# Patient Record
Sex: Female | Born: 1935 | Race: White | Hispanic: No | Smoking: Never smoker
Health system: Southern US, Community
[De-identification: ages and names within clinical notes are randomized; demographics above are authoritative.]

## PROBLEM LIST (undated history)

## (undated) DIAGNOSIS — K219 Gastro-esophageal reflux disease without esophagitis: Secondary | ICD-10-CM

## (undated) DIAGNOSIS — IMO0002 Reserved for concepts with insufficient information to code with codable children: Secondary | ICD-10-CM

## (undated) DIAGNOSIS — E039 Hypothyroidism, unspecified: Secondary | ICD-10-CM

## (undated) DIAGNOSIS — E785 Hyperlipidemia, unspecified: Secondary | ICD-10-CM

## (undated) DIAGNOSIS — I951 Orthostatic hypotension: Secondary | ICD-10-CM

## (undated) DIAGNOSIS — K279 Peptic ulcer, site unspecified, unspecified as acute or chronic, without hemorrhage or perforation: Secondary | ICD-10-CM

## (undated) DIAGNOSIS — M1990 Unspecified osteoarthritis, unspecified site: Secondary | ICD-10-CM

## (undated) DIAGNOSIS — F0390 Unspecified dementia without behavioral disturbance: Secondary | ICD-10-CM

## (undated) DIAGNOSIS — R55 Syncope and collapse: Secondary | ICD-10-CM

## (undated) DIAGNOSIS — I2510 Atherosclerotic heart disease of native coronary artery without angina pectoris: Secondary | ICD-10-CM

## (undated) DIAGNOSIS — C50919 Malignant neoplasm of unspecified site of unspecified female breast: Secondary | ICD-10-CM

## (undated) HISTORY — DX: Reserved for concepts with insufficient information to code with codable children: IMO0002

## (undated) HISTORY — DX: Gastro-esophageal reflux disease without esophagitis: K21.9

## (undated) HISTORY — PX: NASAL SINUS SURGERY: SHX719

## (undated) HISTORY — DX: Hypothyroidism, unspecified: E03.9

## (undated) HISTORY — DX: Syncope and collapse: R55

## (undated) HISTORY — DX: Unspecified osteoarthritis, unspecified site: M19.90

## (undated) HISTORY — DX: Hyperlipidemia, unspecified: E78.5

## (undated) HISTORY — DX: Atherosclerotic heart disease of native coronary artery without angina pectoris: I25.10

## (undated) HISTORY — DX: Peptic ulcer, site unspecified, unspecified as acute or chronic, without hemorrhage or perforation: K27.9

## (undated) HISTORY — PX: ROTATOR CUFF REPAIR: SHX139

## (undated) HISTORY — PX: APPENDECTOMY: SHX54

## (undated) HISTORY — PX: CHOLECYSTECTOMY: SHX55

## (undated) HISTORY — DX: Orthostatic hypotension: I95.1

## (undated) HISTORY — DX: Malignant neoplasm of unspecified site of unspecified female breast: C50.919

---

## 1968-05-01 HISTORY — PX: ABDOMINAL HYSTERECTOMY: SUR658

## 2004-05-01 HISTORY — PX: BREAST LUMPECTOMY: SHX2

## 2004-05-01 HISTORY — PX: BREAST EXCISIONAL BIOPSY: SUR124

## 2004-06-01 ENCOUNTER — Ambulatory Visit: Payer: Self-pay | Admitting: Internal Medicine

## 2004-06-15 ENCOUNTER — Ambulatory Visit: Payer: Self-pay | Admitting: Internal Medicine

## 2004-06-29 ENCOUNTER — Other Ambulatory Visit: Payer: Self-pay

## 2004-06-29 ENCOUNTER — Ambulatory Visit: Payer: Self-pay | Admitting: Surgery

## 2004-07-06 ENCOUNTER — Ambulatory Visit: Payer: Self-pay | Admitting: Surgery

## 2004-07-19 ENCOUNTER — Ambulatory Visit: Payer: Self-pay | Admitting: Oncology

## 2004-08-01 ENCOUNTER — Ambulatory Visit: Payer: Self-pay | Admitting: Oncology

## 2004-08-29 ENCOUNTER — Ambulatory Visit: Payer: Self-pay | Admitting: Oncology

## 2004-09-29 ENCOUNTER — Ambulatory Visit: Payer: Self-pay | Admitting: Oncology

## 2004-10-26 ENCOUNTER — Emergency Department: Payer: Self-pay | Admitting: Emergency Medicine

## 2004-10-26 ENCOUNTER — Other Ambulatory Visit: Payer: Self-pay

## 2004-10-29 ENCOUNTER — Ambulatory Visit: Payer: Self-pay | Admitting: Oncology

## 2004-11-04 ENCOUNTER — Ambulatory Visit: Payer: Self-pay | Admitting: Unknown Physician Specialty

## 2004-11-10 ENCOUNTER — Ambulatory Visit: Payer: Self-pay | Admitting: Internal Medicine

## 2004-12-16 ENCOUNTER — Ambulatory Visit: Payer: Self-pay | Admitting: Internal Medicine

## 2005-01-17 ENCOUNTER — Ambulatory Visit: Payer: Self-pay | Admitting: Oncology

## 2005-03-13 ENCOUNTER — Ambulatory Visit: Payer: Self-pay | Admitting: Radiation Oncology

## 2005-06-12 ENCOUNTER — Ambulatory Visit: Payer: Self-pay | Admitting: Oncology

## 2005-07-17 ENCOUNTER — Ambulatory Visit: Payer: Self-pay | Admitting: Oncology

## 2006-01-15 ENCOUNTER — Ambulatory Visit: Payer: Self-pay | Admitting: Oncology

## 2006-01-29 ENCOUNTER — Ambulatory Visit: Payer: Self-pay | Admitting: Oncology

## 2006-03-28 ENCOUNTER — Ambulatory Visit: Payer: Self-pay | Admitting: Internal Medicine

## 2006-04-19 ENCOUNTER — Ambulatory Visit: Payer: Self-pay

## 2006-04-27 ENCOUNTER — Ambulatory Visit: Payer: Self-pay | Admitting: Internal Medicine

## 2006-05-01 HISTORY — PX: CORONARY ARTERY BYPASS GRAFT: SHX141

## 2006-05-08 ENCOUNTER — Ambulatory Visit: Payer: Self-pay

## 2006-05-30 ENCOUNTER — Inpatient Hospital Stay (HOSPITAL_COMMUNITY): Admission: RE | Admit: 2006-05-30 | Discharge: 2006-06-08 | Payer: Self-pay | Admitting: Cardiothoracic Surgery

## 2006-06-20 ENCOUNTER — Ambulatory Visit: Payer: Self-pay | Admitting: Internal Medicine

## 2006-06-27 ENCOUNTER — Ambulatory Visit: Payer: Self-pay | Admitting: Internal Medicine

## 2006-06-28 ENCOUNTER — Ambulatory Visit: Payer: Self-pay | Admitting: Cardiothoracic Surgery

## 2006-07-06 ENCOUNTER — Ambulatory Visit: Payer: Self-pay | Admitting: Internal Medicine

## 2006-07-11 ENCOUNTER — Ambulatory Visit: Payer: Self-pay | Admitting: Internal Medicine

## 2006-07-19 ENCOUNTER — Ambulatory Visit: Payer: Self-pay | Admitting: Cardiothoracic Surgery

## 2006-08-22 ENCOUNTER — Encounter: Payer: Self-pay | Admitting: Internal Medicine

## 2006-08-22 ENCOUNTER — Ambulatory Visit: Payer: Self-pay | Admitting: Oncology

## 2006-08-30 ENCOUNTER — Ambulatory Visit: Payer: Self-pay | Admitting: Oncology

## 2006-09-06 ENCOUNTER — Ambulatory Visit: Payer: Self-pay | Admitting: Oncology

## 2006-09-15 ENCOUNTER — Ambulatory Visit: Payer: Self-pay | Admitting: Orthopedic Surgery

## 2006-09-30 ENCOUNTER — Ambulatory Visit: Payer: Self-pay | Admitting: Oncology

## 2006-10-11 ENCOUNTER — Ambulatory Visit: Payer: Self-pay | Admitting: Internal Medicine

## 2007-01-11 ENCOUNTER — Ambulatory Visit: Payer: Self-pay | Admitting: Internal Medicine

## 2007-01-14 ENCOUNTER — Ambulatory Visit: Payer: Self-pay | Admitting: Internal Medicine

## 2007-01-17 ENCOUNTER — Ambulatory Visit: Payer: Self-pay | Admitting: Internal Medicine

## 2007-03-02 ENCOUNTER — Ambulatory Visit: Payer: Self-pay | Admitting: Oncology

## 2007-03-06 ENCOUNTER — Ambulatory Visit: Payer: Self-pay | Admitting: Oncology

## 2007-03-07 ENCOUNTER — Ambulatory Visit: Payer: Self-pay | Admitting: Internal Medicine

## 2007-04-01 ENCOUNTER — Ambulatory Visit: Payer: Self-pay | Admitting: Oncology

## 2007-08-28 ENCOUNTER — Ambulatory Visit: Payer: Self-pay | Admitting: Oncology

## 2007-08-30 ENCOUNTER — Ambulatory Visit: Payer: Self-pay | Admitting: Oncology

## 2007-09-02 ENCOUNTER — Ambulatory Visit: Payer: Self-pay | Admitting: Internal Medicine

## 2007-09-04 ENCOUNTER — Ambulatory Visit: Payer: Self-pay | Admitting: Oncology

## 2007-09-30 ENCOUNTER — Ambulatory Visit: Payer: Self-pay | Admitting: Oncology

## 2007-10-02 ENCOUNTER — Inpatient Hospital Stay: Payer: Self-pay | Admitting: Internal Medicine

## 2007-10-16 ENCOUNTER — Ambulatory Visit: Payer: Self-pay | Admitting: Otolaryngology

## 2007-10-21 ENCOUNTER — Ambulatory Visit: Payer: Self-pay | Admitting: Otolaryngology

## 2007-11-21 ENCOUNTER — Ambulatory Visit: Payer: Self-pay | Admitting: Internal Medicine

## 2007-12-02 ENCOUNTER — Ambulatory Visit: Payer: Self-pay | Admitting: Internal Medicine

## 2007-12-19 ENCOUNTER — Ambulatory Visit: Payer: Self-pay | Admitting: Neurosurgery

## 2008-03-01 ENCOUNTER — Ambulatory Visit: Payer: Self-pay | Admitting: Oncology

## 2008-03-12 ENCOUNTER — Ambulatory Visit: Payer: Self-pay | Admitting: Oncology

## 2008-03-31 ENCOUNTER — Ambulatory Visit: Payer: Self-pay | Admitting: Oncology

## 2008-05-27 ENCOUNTER — Ambulatory Visit: Payer: Self-pay | Admitting: Internal Medicine

## 2008-06-04 ENCOUNTER — Ambulatory Visit: Payer: Medicare Other | Admitting: Oncology

## 2008-09-24 ENCOUNTER — Encounter: Payer: Self-pay | Admitting: Internal Medicine

## 2008-09-24 ENCOUNTER — Ambulatory Visit: Payer: Self-pay | Admitting: Internal Medicine

## 2008-09-24 DIAGNOSIS — I2510 Atherosclerotic heart disease of native coronary artery without angina pectoris: Secondary | ICD-10-CM | POA: Insufficient documentation

## 2008-09-24 DIAGNOSIS — I951 Orthostatic hypotension: Secondary | ICD-10-CM

## 2008-09-24 DIAGNOSIS — R5383 Other fatigue: Secondary | ICD-10-CM

## 2008-09-24 DIAGNOSIS — R9431 Abnormal electrocardiogram [ECG] [EKG]: Secondary | ICD-10-CM

## 2008-09-24 DIAGNOSIS — R5381 Other malaise: Secondary | ICD-10-CM

## 2008-09-29 ENCOUNTER — Ambulatory Visit: Payer: Medicare Other | Admitting: Nurse Practitioner

## 2008-10-05 ENCOUNTER — Ambulatory Visit: Payer: Medicare Other | Admitting: Oncology

## 2008-10-29 ENCOUNTER — Ambulatory Visit: Payer: Medicare Other | Admitting: Oncology

## 2008-10-29 ENCOUNTER — Ambulatory Visit: Payer: Medicare Other | Admitting: Nurse Practitioner

## 2008-12-21 ENCOUNTER — Ambulatory Visit: Payer: Medicare Other | Admitting: Orthopedic Surgery

## 2009-01-13 ENCOUNTER — Emergency Department: Payer: Medicare Other | Admitting: Emergency Medicine

## 2009-03-04 ENCOUNTER — Ambulatory Visit: Payer: Medicare Other | Admitting: Otolaryngology

## 2009-03-23 ENCOUNTER — Ambulatory Visit: Payer: Self-pay | Admitting: Internal Medicine

## 2009-03-23 DIAGNOSIS — E785 Hyperlipidemia, unspecified: Secondary | ICD-10-CM

## 2009-03-23 DIAGNOSIS — I10 Essential (primary) hypertension: Secondary | ICD-10-CM | POA: Insufficient documentation

## 2009-03-31 ENCOUNTER — Ambulatory Visit: Payer: Medicare Other | Admitting: Oncology

## 2009-04-06 ENCOUNTER — Ambulatory Visit: Payer: Medicare Other | Admitting: Internal Medicine

## 2009-04-07 ENCOUNTER — Ambulatory Visit: Payer: Medicare Other | Admitting: Oncology

## 2009-05-01 ENCOUNTER — Ambulatory Visit: Payer: Medicare Other | Admitting: Oncology

## 2009-07-04 ENCOUNTER — Ambulatory Visit: Payer: Medicare Other

## 2009-09-21 ENCOUNTER — Ambulatory Visit: Payer: Medicare Other | Admitting: Oncology

## 2009-09-23 ENCOUNTER — Ambulatory Visit: Payer: Self-pay | Admitting: Internal Medicine

## 2009-09-27 ENCOUNTER — Ambulatory Visit: Payer: Medicare Other | Admitting: Oncology

## 2009-09-29 ENCOUNTER — Ambulatory Visit: Payer: Medicare Other | Admitting: Oncology

## 2009-10-04 ENCOUNTER — Ambulatory Visit: Payer: Medicare Other | Admitting: Oncology

## 2009-10-29 ENCOUNTER — Ambulatory Visit: Payer: Medicare Other | Admitting: Oncology

## 2009-11-10 ENCOUNTER — Encounter: Admission: RE | Admit: 2009-11-10 | Discharge: 2009-11-10 | Payer: Self-pay | Admitting: Orthopedic Surgery

## 2009-11-17 ENCOUNTER — Telehealth: Payer: Self-pay | Admitting: Internal Medicine

## 2009-12-23 ENCOUNTER — Encounter: Admission: RE | Admit: 2009-12-23 | Discharge: 2009-12-23 | Payer: Self-pay | Admitting: Orthopedic Surgery

## 2010-02-07 ENCOUNTER — Encounter: Payer: Self-pay | Admitting: Internal Medicine

## 2010-02-14 ENCOUNTER — Ambulatory Visit: Payer: Medicare Other | Admitting: Internal Medicine

## 2010-02-24 ENCOUNTER — Encounter: Admission: RE | Admit: 2010-02-24 | Discharge: 2010-02-24 | Payer: Self-pay | Admitting: Orthopedic Surgery

## 2010-03-25 ENCOUNTER — Encounter: Payer: Self-pay | Admitting: Internal Medicine

## 2010-03-25 ENCOUNTER — Ambulatory Visit: Payer: Self-pay | Admitting: Internal Medicine

## 2010-03-25 DIAGNOSIS — R112 Nausea with vomiting, unspecified: Secondary | ICD-10-CM

## 2010-03-28 ENCOUNTER — Ambulatory Visit: Payer: Medicare Other | Admitting: Internal Medicine

## 2010-03-28 ENCOUNTER — Ambulatory Visit: Payer: Self-pay | Admitting: Cardiovascular Disease

## 2010-03-28 ENCOUNTER — Encounter: Payer: Self-pay | Admitting: Internal Medicine

## 2010-03-31 ENCOUNTER — Ambulatory Visit: Payer: Medicare Other | Admitting: Oncology

## 2010-04-14 ENCOUNTER — Ambulatory Visit: Payer: Medicare Other | Admitting: Oncology

## 2010-05-01 ENCOUNTER — Ambulatory Visit: Payer: Medicare Other | Admitting: Oncology

## 2010-05-31 NOTE — Progress Notes (Signed)
Summary: MEDICAL COMPLAINT  Phone Note Call from Patient   Summary of Call: PT CALLED WANTED TO SEE DR. Gala Romney.  STATES SHE DOES NOT FEEL WELL, GENERAL WEAKNESS, PASSED OUT ABOUT 2 WKS AGO FOR A SHORT TIME AND NOW WHEN SHE WALKS AROUND HER HOUSE SHE FEELS LIKE SHE MIGHT PASS OUT BUT DOESN'T.  DOES SHE NEED TO HAVE BLOOD WORK?  CAN'T SEE HER PCP FOR A LONG TIME.  PLEASE ADVISE HER Initial call taken by: [name],  November 17, 2009 11:24 AM  Follow-up for Phone Call        Talked to pt she c/o weakness want blood taken to see if she is anemic, stated that she can't get in to see Dr. [name] until October.  Asked her to call Dr. Roby Lofts nurse to see if they could do her blood work. Informed her that Dr. Teressa Lower isn't here in [place] until Aug but that i could schedule her to see Dr. Mariah Milling.  She stated that she would see if Dr. [name] would do her blood work if she would not the pt would call me back. Follow-up by: Benedict Needy, RN,  November 17, 2009 2:16 PM

## 2010-05-31 NOTE — Assessment & Plan Note (Signed)
Summary: F6M/AMD      Allergies Added:   Visit Type:  Follow-up Primary Provider:  D[facility] [place]  CC:  c/o shortness of breath with activity.  She does break out into a sweat mostly when shopping.Marland Kitchen  History of Present Illness: Angela Howe is a very pleasant 77 year old woman with history of coronary artery disease, status post bypass grafting in 2008. She also has a history of hypertension, hyperlipidemia, breast cancer status post left lumpectomy, and gastroesophageal reflux disease. Returns for routine f/u.  Says she is having a terrible time over past 2 months. Every time she goes any where notes she becomes diaphoretic and gets nauseated and throws up. No CP or dyspnea. Does not happen at rest. Denies dizziness or palpiations. No presyncope. Symptoms not similar to her symptoms prior to CABG which were left neck and shoulder pain. Unable to continue with her walking program any more. No ab pain or diarrhea. No weight loss.     Current Medications (verified): 1)  Calcium Carbonate-Vitamin D 600-400 Mg-Unit  Tabs (Calcium Carbonate-Vitamin D) .Marland Kitchen.. 1 By Mouth Two Times A Day 2)  Synthroid 50 Mcg Tabs (Levothyroxine Sodium) .... Once Daily 3)  Lansoprazole 30 Mg Cpdr (Lansoprazole) .Marland Kitchen.. 1 By Mouth Once Daily 4)  Amitriptyline Hcl 25 Mg Tabs (Amitriptyline Hcl) .... Once Daily 5)  Celexa 20 Mg Tabs (Citalopram Hydrobromide) .... Once Daily 6)  Aspirin 81 Mg Tbec (Aspirin) .... Take One Tablet By Mouth Daily 7)  Appearex 2.5 Mg Tabs (Biotin) .... Once Daily 8)  Lipitor 40 Mg Tabs (Atorvastatin Calcium) .... Take One Tablet By Mouth Daily. 9)  Eye-Vite Plus Lutein  Caps (Multiple Vitamins-Minerals) .... Once Daily 10)  Acetaminophen 650 Mg  Tbcr (Acetaminophen) .... 3 Tabs 1-2 Times Daily 11)  Metoprolol Succinate 25 Mg Xr24h-Tab (Metoprolol Succinate) .... Take One Tablet By Mouth Daily  Allergies (verified): 1)  ! Fosamax  Past History:  Past Medical History: Last updated:  09/24/2008 1. Coronary artery disease    --s/p CABG 2008 2. Orthostatic hypotension 3. Syncope with normal EF and monitor 4. Hyperlipidemia with previous intolerance of statins 5. Breast lump s/p lumpectomy and XRT 6. Peptic ulcer disease/GERD 7. Osteoarthritis 8. Degenerative disk disease 9. Hypothyroidism  Past Surgical History: Last updated: 07/19/2008 CABG x 3  -- 05/2006 lumpectomy - 2006 hysterectomy -- 1970 sinus surgery rotator cuff repair appendectomy  Family History: Last updated: 07/19/2008 sister deceased with a NI father had spinal meningitis mother deacesed from age a brother had pancreatic cancer  Social History: Last updated: 2008/07/19 Retired  Widowed  Tobacco Use - No.  Alcohol Use - no Regular Exercise - yes Drug Use - no  Risk Factors: Exercise: yes (07-19-08)  Risk Factors: Smoking Status: never (07/19/08)  Review of Systems       As per HPI and past medical history; otherwise all systems negative.   Vital Signs:  Patient profile:   77 year old female Height:      63 inches Weight:      143 pounds BMI:     25.42 Pulse rate:   83 / minute BP sitting:   110 / 54  (left arm) Cuff size:   regular  Vitals Entered By: Bishop Dublin, CMA (March 25, 2010 10:37 AM)  Physical Exam  General:  Fatigued appearing. no resp difficulty HEENT: normal Neck: supple. no JVD. Carotids 2+ bilat; no bruits.  Cor: PMI nondisplaced. Regular rate & rhythm. No rubs, gallops, murmur. Lungs: clear Abdomen: soft, nontender,  nondistended.  Good bowel sounds. Extremities: no cyanosis, clubbing, rash, edema Neuro: alert & orientedx3, cranial nerves grossly intact. moves all 4 extremities w/o difficulty. affect pleasant    Impression & Recommendations:  Problem # 1:  NAUSEA WITH VOMITING (ICD-787.01) Unclear etiology. Given the exertional component could certainly an anginal equivalent but this is very different from her previous angina. We  discussed cath versus Myoview to evalauate and she is very reluctant about cath so we will proceed with Myoview to start with. I am wondering if it also may be orthostatic in nature. Will check orthostatics She is not on any anti-hypertensives except low-dose Toprol. Can try liberalizing salt and fluid intake and see if this makes any difference. By history does not appear to be arrhytmia related though could consider a monitor in the future.   Problem # 2:  CAD, NATIVE VESSEL (ICD-414.01) As above.  Other Orders: Nuclear Stress Test (Nuc Stress Test)  Patient Instructions: 1)  Your physician recommends that you schedule a follow-up appointment in: 2 months 2)  Your physician has requested that you have an Scientist, physiological at [facility].  For further information please visit [email or web address]  Please follow instruction sheet, as given. 3)  Your physician has recommended you make the following change in your medication: Stop Lopressor.

## 2010-05-31 NOTE — Assessment & Plan Note (Signed)
Summary: B1Y      Allergies Added: ! FOSAMAX  Visit Type:  Follow-up Primary Provider:  Dale [place]  CC:  no cardiac complaints.  History of Present Illness: Angela Howe is a very pleasant 77 year old woman with history of coronary artery disease, status post bypass grafting in 2008. She also has a history of hypertension, hyperlipidemia, breast cancer status post left lumpectomy, and gastroesophageal reflux disease. Returns for routine f/u.  Says she's been feeling pretty well. Denies any chest pain.  Walking 15 mins 2 times per day in driveway. When she can't get out and walk she rides the stationary bike. No problems with her medcine. Dizziness resolved. Occasionally weak when she stands up too fast.  Cholesterol followed by Dr. [name].   Current Medications (verified): 1)  Calcium Carbonate-Vitamin D 600-400 Mg-Unit  Tabs (Calcium Carbonate-Vitamin D) .Marland Kitchen.. 1 By Mouth Two Times A Day 2)  Synthroid 50 Mcg Tabs (Levothyroxine Sodium) .... Once Daily 3)  Lansoprazole 30 Mg Cpdr (Lansoprazole) .Marland Kitchen.. 1 By Mouth Once Daily 4)  Amitriptyline Hcl 25 Mg Tabs (Amitriptyline Hcl) .... Once Daily 5)  Celexa 20 Mg Tabs (Citalopram Hydrobromide) .... Once Daily 6)  Aspirin 81 Mg Tbec (Aspirin) .... Take One Tablet By Mouth Daily 7)  Appearex 2.5 Mg Tabs (Biotin) .... Once Daily 8)  Lipitor 40 Mg Tabs (Atorvastatin Calcium) .... Take One Tablet By Mouth Daily. 9)  Eye-Vite Plus Lutein  Caps (Multiple Vitamins-Minerals) .... Once Daily 10)  Acetaminophen 650 Mg  Tbcr (Acetaminophen) .... 3 Tabs 1-2 Times Daily 11)  Metoprolol Succinate 25 Mg Xr24h-Tab (Metoprolol Succinate) .... Take One Tablet By Mouth Daily  Allergies (verified): 1)  ! Fosamax  Past History:  Past Medical History: Last updated: 09/24/2008 1. Coronary artery disease    --s/p CABG 2008 2. Orthostatic hypotension 3. Syncope with normal EF and monitor 4. Hyperlipidemia with previous intolerance of statins 5. Breast lump  s/p lumpectomy and XRT 6. Peptic ulcer disease/GERD 7. Osteoarthritis 8. Degenerative disk disease 9. Hypothyroidism  Review of Systems       As per HPI and past medical history; otherwise all systems negative.   Vital Signs:  Patient profile:   77 year old female Height:      63 inches Weight:      142 pounds BMI:     25.25 Pulse rate:   78 / minute BP sitting:   120 / 56  (right arm) Cuff size:   regular  Vitals Entered By: Hardin Negus, RMA (Sep 23, 2009 4:43 PM)  Physical Exam  General:  Gen: well appearing. no resp difficulty HEENT: normal Neck: supple. no JVD. Carotids 2+ bilat; no bruits.  Cor: PMI nondisplaced. Regular rate & rhythm. No rubs, gallops, murmur. Lungs: clear Abdomen: soft, nontender, nondistended.  Good bowel sounds. Extremities: no cyanosis, clubbing, rash, edema Neuro: alert & orientedx3, cranial nerves grossly intact. moves all 4 extremities w/o difficulty. affect pleasant    Impression & Recommendations:  Problem # 1:  CAD, NATIVE VESSEL (ICD-414.01) Stable. No evidence of ischemia. Continue current regimen.  Problem # 2:  ORTHOSTATIC HYPOTENSION (ICD-458.0) Improved with compression stockings.   Problem # 3:  HYPERLIPIDEMIA-MIXED (ICD-272.4) Followed by Dr. [name]. Goal LDL. Continue statin.   Patient Instructions: 1)  Your physician recommends that you schedule a follow-up appointment in: 6 months

## 2010-05-31 NOTE — Letter (Signed)
Summary: Radio broadcast assistant at [address]   [place], [place] [address]   Phone: [phone]  Fax: [phone]      [facility] MYOVIEW  Patient Name:  Angela Howe          MRN:  [id number]  DOB:  1925/12/08             Weight:  143 Home Telephone:  [phone]           Work Phone:   Referring Physician:Dr Bensimhon   _____ Gated Myoview  __X___ Lexi Scan on Monday Novenber 28, 2011 arrive at 8:30am.    Instructions regarding medication: Can take all medications as prescribed with a sip of water.    PLEASE NOTIFY THE OFFICE AT LEAST 24 HOURS IN ADVANCE IF YOU ARE UNABLE TO KEEP YOUR APPOINTMENT.  [phone].   How to prepare for your Myoview test:  1)   Do not eat or drink after midnight. 2)   No caffeine or decaffeinated 12 hours prior to arrival. 3)   Your medication may be taken with water.  If your doctor stopped a        medicaiton because of this test, do not take that medication. 4)   Ladies, please do not wear dresses.  Skirts or pants are appropriate.       Please wear a short sleeve shirt. 5)   No perfume, cologne or lotion. 6)   Wear comfortable walking shoes.  No heels!    PLEASE REPORT TO [facility] MEDICAL MALL ENTRANCE. THE VOLUNTEERS AT THE FIRST DESK WILL DIRECT YOU WHERE TO GO.

## 2010-06-07 ENCOUNTER — Encounter: Payer: Self-pay | Admitting: Internal Medicine

## 2010-06-07 ENCOUNTER — Ambulatory Visit (INDEPENDENT_AMBULATORY_CARE_PROVIDER_SITE_OTHER): Payer: Medicare Other | Admitting: Internal Medicine

## 2010-06-07 DIAGNOSIS — R0602 Shortness of breath: Secondary | ICD-10-CM

## 2010-06-08 ENCOUNTER — Encounter: Payer: Self-pay | Admitting: Cardiovascular Disease

## 2010-06-08 LAB — CONVERTED CEMR LAB
AST: 19 units/L (ref 0–37)
Alkaline Phosphatase: 62 units/L (ref 39–117)
Basophils Absolute: 0 10*3/uL (ref 0.0–0.1)
Bilirubin, Direct: <0.1 mg/dL (ref 0.0–0.3)
Calcium: 9.2 mg/dL (ref 8.4–10.5)
HCT: 35.6 % — ABNORMAL LOW (ref 36.0–46.0)
Hemoglobin: 11.8 g/dL — ABNORMAL LOW (ref 12.0–15.0)
Lymphocytes Relative: 22 % (ref 12–46)
Lymphs Abs: 1.5 10*3/uL (ref 0.7–4.0)
Neutro Abs: 4.7 10*3/uL (ref 1.7–7.7)
Platelets: 294 10*3/uL (ref 150–400)
Potassium: 5 meq/L (ref 3.5–5.3)
Prothrombin Time: 13.1 s (ref 11.6–15.2)
RDW: 12.8 % (ref 11.5–15.5)
Sodium: 140 meq/L (ref 135–145)
Total Bilirubin: 0.3 mg/dL (ref 0.3–1.2)
WBC: 6.8 10*3/uL (ref 4.0–10.5)
aPTT: 28 s (ref 24–37)

## 2010-06-10 ENCOUNTER — Inpatient Hospital Stay (HOSPITAL_BASED_OUTPATIENT_CLINIC_OR_DEPARTMENT_OTHER)
Admission: RE | Admit: 2010-06-10 | Discharge: 2010-06-10 | Disposition: A | Discharge status: Home / Self Care | Payer: Medicare Other | Source: Ambulatory Visit | Attending: Internal Medicine | Admitting: Internal Medicine

## 2010-06-10 DIAGNOSIS — R110 Nausea: Secondary | ICD-10-CM | POA: Insufficient documentation

## 2010-06-10 DIAGNOSIS — R61 Generalized hyperhidrosis: Secondary | ICD-10-CM | POA: Insufficient documentation

## 2010-06-10 DIAGNOSIS — E785 Hyperlipidemia, unspecified: Secondary | ICD-10-CM | POA: Insufficient documentation

## 2010-06-10 DIAGNOSIS — I2510 Atherosclerotic heart disease of native coronary artery without angina pectoris: Secondary | ICD-10-CM | POA: Insufficient documentation

## 2010-06-10 DIAGNOSIS — I10 Essential (primary) hypertension: Secondary | ICD-10-CM | POA: Insufficient documentation

## 2010-06-10 DIAGNOSIS — I2582 Chronic total occlusion of coronary artery: Secondary | ICD-10-CM | POA: Insufficient documentation

## 2010-06-10 DIAGNOSIS — R42 Dizziness and giddiness: Secondary | ICD-10-CM | POA: Insufficient documentation

## 2010-06-10 DIAGNOSIS — I25810 Atherosclerosis of coronary artery bypass graft(s) without angina pectoris: Secondary | ICD-10-CM | POA: Insufficient documentation

## 2010-06-16 NOTE — Letter (Signed)
Summary: Cardiac Catheterization Instructions- [facility]  [facility] at [address]   [place], [place] [address]   Phone: [phone]  Fax: [phone]     06/07/2010 MRN: [id number]  [facility] [address] [place], [place]  [address]  Dear Ms. Burcher,   You are scheduled for a Cardiac Catheterization on Friday 06/10/10 with Dr. Gala Romney. Please arrive to the 1st floor of the Heart and Vascular Center at [facility] at 6:30 am on the day of your procedure. Please do not arrive before 6:30 a.m. Call the Heart and Vascular Center at [phone] if you are unable to make your appointmnet. The Code to get into the parking garage under the building is 0300. Take the elevators to the 1st floor. You must have someone to drive you home. Someone must be with you for the first 24 hours after you arrive home. Please wear clothes that are easy to get on and off and wear slip-on shoes. Do not eat or drink after midnight except water with your medications that morning. Bring all your medications and current insurance cards with you.  _X_ Make sure you take your aspirin.  ___ You may take ALL of your medications with water that morning. ________________________________________________________________________________________________________________________________    The usual length of stay after your procedure is 2 to 3 hours. This can vary.  If you have any questions, please call the office at the number listed above.   Lanny Hurst RN

## 2010-06-22 NOTE — Procedures (Signed)
[name], [name]                  ACCOUNT NO.:  [id number]  MEDICAL RECORD NO.:  [id number]          PATIENT TYPE:  LOCATION:                                 FACILITY:  PHYSICIAN:  [name]. Lynia Landry, MD     DATE OF BIRTH:  DATE OF PROCEDURE:  06/10/2010 DATE OF DISCHARGE:                           CARDIAC CATHETERIZATION   PRIMARY CARE PHYSICIAN:  Dr. [place] .  PATIENT IDENTIFICATION:  [name] is an 77 year old woman with history of hypertension, hyperlipidemia, and coronary artery disease.  She is status post bypass grafting in 2008.  Recently, she has been complaining of multiple episodes of diaphoresis, nausea, and dizziness.  She has had a negative stress test, however, her symptoms persisted and there was concern about the possibility of worsening coronary artery disease, so we scheduled coronary angiography.  PROCEDURES PERFORMED: 1. Selective coronary angiography. 2. LIMA angiography. 3. Saphenous vein graft angiography x2. 4. Left heart cath. 5. Left ventriculogram.  DESCRIPTION OF PROCEDURE:  The risks and indication were explained. Consent was signed and placed on the chart.  A 4-French arterial sheath was placed in the right femoral artery.  Standard catheters including JL- 4, 3D RC, left coronary bypass graft, and angled pigtail were used.  All catheter exchanges were made over wire.  There were no apparent complications.  Central aortic pressure 143/59 with a mean of 95.  LV pressure 146/10 with an EDP of 16.  There was no aortic stenosis.  Left main was normal.  LAD was totally occluded ostially.  Left circumflex was a large vessel, made up primarily of an OM-1, it is angiographically normal.  Right coronary artery was a very large dominant vessel, gave off a PDA, extremely large first posterolateral and a small second posterolateral. There was a 30-40% narrowing in the proximal portion of the PDA.  LIMA to the LAD was widely patent with  good flow down the native LAD.  Saphenous vein graft to the diagonal branch.  I was unclear if this was actually tied to the diagonal or the LAD, I suspect it is probably the diagonal that was widely patent.  Saphenous vein graft to the right PDA was totally occluded ostially due to good flow in the native vessel.  Left ventriculogram done in the RAO position showed an EF of 65%.  There were no regional wall motion abnormalities on panning down over the abdominal aorta.  After the cath, there was evidence of at least moderate plaquing throughout the aorta, but no aneurysmal dilatation.  ASSESSMENT: 1. One-vessel coronary artery disease. 2. Patent left internal mammary artery graft and to the left anterior     descending coronary artery and saphenous vein graft to the     diagonal. 3. Occluded saphenous vein graft to the posterior descending coronary     artery due to good flow in the native right coronary artery. 4. Normal left ventricular function.  PLAN/DISCUSSION:  Revascularization is quite stable.  There were no areas of significant ischemia.  I do not think her current symptoms are cardiac in nature at this point.  She will follow up  with her primary care doctor.  We will continue medical therapy for her heart disease.     [name]. Diasia Henken, MD     DRB/MEDQ  D:  06/10/2010  T:  06/10/2010  Job:  [id number]  cc:   Dale [place]  Electronically Signed by Arvilla Meres MD on 06/22/2010 01:57:37 PM

## 2010-06-22 NOTE — Assessment & Plan Note (Signed)
Summary: F2M/AMD      Allergies Added:   Visit Type:  Follow-up Primary Provider:  Dale [place]  CC:  c/o shortness of breath with nausea, lightheaded, and fatigue and feels like could faint at times..  History of Present Illness: Angela Howe is a very pleasant 77 year old woman with history of coronary artery disease, status post bypass grafting in 2008. She also has a history of hypertension, hyperlipidemia, breast cancer status post left lumpectomy, and gastroesophageal reflux disease. Returns for routine f/u.  At her last visit she wasn't feeling well c/o nausea, dyspnea and fatigue. We did Myoview in 11/11 and it showed EF 98%(!) with normal perfusion.   Returns to routine f/u. Stil doesn't feel well. Had flu two weeks ago and now says she has no energy at all. Takes all her energy even to take a shower or a bath. No chest pain but gets dyspneic when walking even short distances. No edema, orthopnea or PND. No change in weight. Denies depression. Continues with intermittent nausea and vomitting. +hot flashes and sweats. Says she can be in store and get weak and then develop sweats with nausea and vomiting. Only happens when she goes and her son thinks it may be anxiety.  No ab pain, diarrhea. No night sweats, fevers, chills or weight loss.     Problems Prior to Update: 1)  Nausea With Vomiting  (ICD-787.01) 2)  Hypertension, Benign  (ICD-401.1) 3)  Hyperlipidemia-mixed  (ICD-272.4) 4)  Orthostatic Hypotension  (ICD-458.0) 5)  Cad, Native Vessel  (ICD-414.01) 6)  Fatigue / Malaise  (ICD-780.79) 7)  Abnormal Ekg  (ICD-794.31)  Medications Prior to Update: 1)  Calcium Carbonate-Vitamin D 600-400 Mg-Unit  Tabs (Calcium Carbonate-Vitamin D) .Marland Kitchen.. 1 By Mouth Two Times A Day 2)  Synthroid 50 Mcg Tabs (Levothyroxine Sodium) .... Once Daily 3)  Lansoprazole 30 Mg Cpdr (Lansoprazole) .Marland Kitchen.. 1 By Mouth Once Daily 4)  Amitriptyline Hcl 25 Mg Tabs (Amitriptyline Hcl) .... Once Daily 5)   Celexa 20 Mg Tabs (Citalopram Hydrobromide) .... Once Daily 6)  Aspirin 81 Mg Tbec (Aspirin) .... Take One Tablet By Mouth Daily 7)  Appearex 2.5 Mg Tabs (Biotin) .... Once Daily 8)  Lipitor 40 Mg Tabs (Atorvastatin Calcium) .... Take One Tablet By Mouth Daily. 9)  Eye-Vite Plus Lutein  Caps (Multiple Vitamins-Minerals) .... Once Daily 10)  Acetaminophen 650 Mg  Tbcr (Acetaminophen) .... 3 Tabs 1-2 Times Daily  Current Medications (verified): 1)  Calcium Carbonate-Vitamin D 600-400 Mg-Unit  Tabs (Calcium Carbonate-Vitamin D) .Marland Kitchen.. 1 By Mouth Two Times A Day 2)  Synthroid 50 Mcg Tabs (Levothyroxine Sodium) .... Once Daily 3)  Lansoprazole 30 Mg Cpdr (Lansoprazole) .Marland Kitchen.. 1 By Mouth Once Daily 4)  Amitriptyline Hcl 25 Mg Tabs (Amitriptyline Hcl) .... Once Daily 5)  Celexa 20 Mg Tabs (Citalopram Hydrobromide) .... Once Daily 6)  Aspirin 81 Mg Tbec (Aspirin) .... Take One Tablet By Mouth Daily 7)  Lipitor 40 Mg Tabs (Atorvastatin Calcium) .... Take One Tablet By Mouth Daily. 8)  Eye-Vite Plus Lutein  Caps (Multiple Vitamins-Minerals) .... Once Daily 9)  Acetaminophen 650 Mg  Tbcr (Acetaminophen) .... 3 Tabs 1-2 Times Daily  Allergies (verified): 1)  ! Fosamax  Past History:  Past Medical History: Last updated: 09/24/2008 1. Coronary artery disease    --s/p CABG 2008 2. Orthostatic hypotension 3. Syncope with normal EF and monitor 4. Hyperlipidemia with previous intolerance of statins 5. Breast lump s/p lumpectomy and XRT 6. Peptic ulcer disease/GERD 7. Osteoarthritis 8. Degenerative disk  disease 9. Hypothyroidism  Past Surgical History: Last updated: 08/10/2008 CABG x 3  -- 05/2006 lumpectomy - 2006 hysterectomy -- 1970 sinus surgery rotator cuff repair appendectomy  Family History: Last updated: 2008-08-10 sister deceased with a NI father had spinal meningitis mother deacesed from age a brother had pancreatic cancer  Social History: Last updated: 08/10/08 Retired    Widowed  Tobacco Use - No.  Alcohol Use - no Regular Exercise - yes Drug Use - no  Risk Factors: Exercise: yes (08/10/08)  Risk Factors: Smoking Status: never (August 10, 2008)  Review of Systems       As per HPI and past medical history; otherwise all systems negative.   Vital Signs:  Patient profile:   77 year old female Height:      63 inches Weight:      140 pounds BMI:     24.89 Pulse rate:   94 / minute BP sitting:   117 / 70  (left arm) Cuff size:   regular  Vitals Entered By: Bishop Dublin, CMA (June 07, 2010 3:14 PM)  Physical Exam  General:  Fatigued appearing. no resp difficulty HEENT: normal Neck: supple. no JVD. Carotids 2+ bilat; no bruits.  Cor: PMI nondisplaced. Regular rate & rhythm. No rubs, gallops, murmur. Lungs: clear Abdomen: soft, nontender, nondistended.  Good bowel sounds. Extremities: no cyanosis, clubbing, rash, edema Neuro: alert & orientedx3, cranial nerves grossly intact. moves all 4 extremities w/o difficulty. affect pleasant    Impression & Recommendations:  Problem # 1:  DYSPNEA/FATIGUE / MALAISE (ICD-780.79) I am unsure what is causing her symptoms. Given the diffuse nature of symtpoms I think this is likely anxiety but I also worry she may have an underlying malignancy. Fortunately, she has not lost weight or had night sweats etc. We reviewed her stress test but she remains concenred that this could possibly be her heart and some of the symptoms are similar to what she had prior to CABG. We have discussed possibility of cath and she would like to proceed. She also has f/u with her PCP in the next week or 2 for further work-up.   Other Orders: T-CBC w/Diff [phone]) T-PTT [phone]) T-Protime, Auto [phone]) T-Basic Metabolic Panel [phone]) T-Hepatic Function [phone]) T-Lipid Profile [phone])

## 2010-06-27 ENCOUNTER — Encounter: Payer: Self-pay | Admitting: Cardiovascular Disease

## 2010-06-27 ENCOUNTER — Encounter (INDEPENDENT_AMBULATORY_CARE_PROVIDER_SITE_OTHER): Payer: Medicare Other | Admitting: Cardiovascular Disease

## 2010-06-27 DIAGNOSIS — E785 Hyperlipidemia, unspecified: Secondary | ICD-10-CM

## 2010-06-27 DIAGNOSIS — I2510 Atherosclerotic heart disease of native coronary artery without angina pectoris: Secondary | ICD-10-CM

## 2010-07-07 NOTE — Assessment & Plan Note (Signed)
Summary: POST CATH/AMD      Allergies Added:   Visit Type:  Follow-up Primary Provider:  Dale [place]  CC:  c/o tingling in legs, fatigue, and and sweats before getting sick. Denies chest pain.Marland Kitchen  History of Present Illness: Angela Howe is a very pleasant 77 year old woman with history of coronary artery disease, status post bypass grafting in 2008. She also has a history of hypertension, hyperlipidemia, breast cancer status post left lumpectomy, and gastroesophageal reflux disease. Returns for routine f/u.  she had a recent admission to the hospital on February 10 for diaphoresis, nausea or, dizziness. cardiac catheterization showed patent LIMA graft, vein graft to the diagonal, occluded vein graft to the PDA though the native RCA was patent, good LV function.  She continues with intermittent nausea and vomitting. +hot flashes and sweats. Says she can be in store and get weak and then develop sweats with nausea and vomiting. Only happens when she goes and her son thinks it may be anxiety.  she does have some lower extremity weakness and her Lipitor was decreased by Dr. [name] to see if her leg strength improved on a lower dose.  No ab pain, diarrhea. No night sweats, fevers, chills or weight loss.   EKG shows normal sinus rhythm with rate 91 beats per minute, no significant ST or T wave changes    Current Medications (verified): 1)  Calcium Carbonate-Vitamin D 600-400 Mg-Unit  Tabs (Calcium Carbonate-Vitamin D) .Marland Kitchen.. 1 By Mouth Two Times A Day 2)  Synthroid 50 Mcg Tabs (Levothyroxine Sodium) .... Once Daily 3)  Lansoprazole 30 Mg Cpdr (Lansoprazole) .Marland Kitchen.. 1 By Mouth Once Daily 4)  Amitriptyline Hcl 25 Mg Tabs (Amitriptyline Hcl) .... Once Daily 5)  Celexa 20 Mg Tabs (Citalopram Hydrobromide) .... Once Daily 6)  Aspirin 81 Mg Tbec (Aspirin) .... Take One Tablet By Mouth Daily 7)  Lipitor 40 Mg Tabs (Atorvastatin Calcium) .... Take One Tablet By Mouth Daily. 8)  Eye-Vite Plus Lutein   Caps (Multiple Vitamins-Minerals) .... Once Daily 9)  Acetaminophen 650 Mg  Tbcr (Acetaminophen) .... 3 Tabs 1-2 Times Daily  Allergies (verified): 1)  ! Fosamax  Past History:  Past Medical History: Last updated: 09/24/2008 1. Coronary artery disease    --s/p CABG 2008 2. Orthostatic hypotension 3. Syncope with normal EF and monitor 4. Hyperlipidemia with previous intolerance of statins 5. Breast lump s/p lumpectomy and XRT 6. Peptic ulcer disease/GERD 7. Osteoarthritis 8. Degenerative disk disease 9. Hypothyroidism  Past Surgical History: Last updated: 08-11-08 CABG x 3  -- 05/2006 lumpectomy - 2006 hysterectomy -- 1970 sinus surgery rotator cuff repair appendectomy  Family History: Last updated: 2008-08-11 sister deceased with a NI father had spinal meningitis mother deacesed from age a brother had pancreatic cancer  Social History: Last updated: 08/11/2008 Retired  Widowed  Tobacco Use - No.  Alcohol Use - no Regular Exercise - yes Drug Use - no  Risk Factors: Exercise: yes (11-Aug-2008)  Risk Factors: Smoking Status: never (2008/08/11)  Review of Systems  The patient denies fatigue, malaise, fever, weight gain/loss, vision loss, decreased hearing, hoarseness, chest pain, palpitations, shortness of breath, prolonged cough, wheezing, sleep apnea, coughing up blood, abdominal pain, blood in stool, nausea, vomiting, diarrhea, heartburn, incontinence, blood in urine, muscle weakness, joint pain, leg swelling, rash, skin lesions, headache, fainting, dizziness, depression, anxiety, enlarged lymph nodes, easy bruising or bleeding, and environmental allergies.         nausea, and lightheaded, vomiting episodes  Vital Signs:  Patient profile:  77 year old female Height:      63 inches Weight:      140 pounds BMI:     24.89 Pulse rate:   91 / minute BP sitting:   138 / 78  (left arm) BP standing:   130 / 70  (left arm) Cuff size:   regular  Vitals  Entered By: Lysbeth Galas CMA (June 27, 2010 11:10 AM)  Physical Exam  General:  Well developed, well nourished, in no acute distress. Head:  normocephalic and atraumatic Neck:  Neck supple, no JVD. No masses, thyromegaly or abnormal cervical nodes. Lungs:  Clear bilaterally to auscultation and percussion. Heart:  Non-displaced PMI, chest non-tender; regular rate and rhythm, S1, S2 without murmurs, rubs or gallops. Carotid upstroke normal, no bruit.  Pedals normal pulses. No edema, no varicosities. Abdomen:  Bowel sounds positive; abdomen soft and non-tender without masses, organomegaly, or hernias noted. No hepatosplenomegaly. Msk:  Back normal, normal gait. Muscle strength and tone normal. Pulses:  pulses normal in all 4 extremities Extremities:  No clubbing or cyanosis. Neurologic:  Alert and oriented x 3. Skin:  Intact without lesions or rashes. Psych:  Normal affect.   Impression & Recommendations:  Problem # 1:  CAD, NATIVE VESSEL (ICD-414.01) stable coronary artery disease, history of CABG. She was on Lipitor 40, now on 20 mg  Her updated medication list for this problem includes:    Aspirin 81 Mg Tbec (Aspirin) .Marland Kitchen... Take one tablet by mouth daily  Problem # 2:  HYPERLIPIDEMIA-MIXED (ICD-272.4) Currently taking Lipitor 20 mg with a decreased dose to see if her leg strength improves.  Her updated medication list for this problem includes:    Lipitor 40 Mg Tabs (Atorvastatin calcium) .Marland Kitchen... Take one tablet by mouth daily.  Problem # 3:  HYPERTENSION, BENIGN (ICD-401.1) blood pressure is relatively well controlled currently on no medications.  Her updated medication list for this problem includes:    Aspirin 81 Mg Tbec (Aspirin) .Marland Kitchen... Take one tablet by mouth daily  Problem # 4:  NAUSEA WITH VOMITING (ICD-787.01) Etiology of her nausea and vomiting is uncertain. It is concerning for anxiety. She also could have orthostatic hypotension secondary to underlying abdominal  discomfort and nausea. I suggested she wear her TED hose, salt low, drink plenty of fluids.  Problem # 5:  FATIGUE / MALAISE (ICD-780.79) She does have chronic fatigue. I am concerned about underlying depression. We can discuss this with her on her next visit. I've encouraged her to continue her exercise.  Other Orders: EKG w/ Interpretation (93000)  Patient Instructions: 1)  Your physician recommends that you schedule a follow-up appointment in: 6 months. 2)  Your physician recommends that you continue on your current medications as directed. Please refer to the Current Medication list given to you today.

## 2010-08-03 ENCOUNTER — Ambulatory Visit: Payer: Medicare Other | Admitting: Orthopedic Surgery

## 2010-09-13 NOTE — Assessment & Plan Note (Signed)
[facility] OFFICE NOTE   DANNAH, RYLES                         MRN:          [id number]  DATE:10/11/2006                            DOB:          1925/10/30    INTERVAL HISTORY:  [name] is a very pleasant 77 year old woman with  history of coronary artery disease, status post coronary artery bypass  grafting in January 2008 by Dr. Tyrone Sage.  She also has a history of  hypertension and hyperlipidemia.  She returns today for routine  followup.   She says she is doing great.  She denies any chest pain or shortness of  breath.  She cannot believe the recovery she has made.  Unfortunately,  she did previously have a fall, and now has a vertebral compression  fracture.  She is pending vertebral kyphoplasty with Dr. Everlene Balls.  We are asked for preoperative risk stratification.   CURRENT MEDICATIONS:  1. Calcium.  2. Synthroid 50 mcg a day.  3. Lipitor 10 a day.  4. Aspirin 325 a day.  5. Nexium 40 a day.  6. Amitriptyline 25.  7. Metoprolol 25 a day.  8. Folic acid.  9. Celexa 20 a day.   PHYSICAL EXAMINATION:  She is well appearing, and in no acute distress.  She ambulates around the clinic without any respiratory difficulty.  Blood pressure is 116/62.  Heart rate is 88.  Weight is 139.  HEENT:  Normal.  NECK:  Supple.  No JVD.  Carotids are 2+ bilaterally without any bruits.  There is no lymphadenopathy or thyromegaly.  CARDIAC:  PMI is nondisplaced.  Regular rate and rhythm.  No murmur.  Soft S4.  LUNGS:  Clear.  ABDOMEN:  Soft, non-tender, and non-distended.  There is no  hepatosplenomegaly.  No bruits.  No masses.  Good bowel sounds.  EXTREMITIES:  Warm with no cyanosis, clubbing, or edema.  No rashes.  NEUROLOGIC:  She is awake and alert.  Oriented x3.  Cranial nerves 2  through 12 are intact.  She moves all 4 extremities without difficulty.   ASSESSMENT AND PLAN:  1. Coronary artery  disease.  This is a quite stable.  She is doing      well.  Continue current therapy.  2. Hypertension.  Well controlled.  3. Hyperlipidemia.  She was previously thought to be intolerant of      statins, but Dr. [name] has persisted, and got her on Lipitor, which      I think is wonderful.  Obviously, it would be nice to titrate to      get her LDL under 70.  4. Preoperative risk stratification.  Given her recent      revascularization and good functional capacity, she is at low risk      for perioperative cardiac complications, and would proceed without      any further cardiac testing.   DISPOSITION:  Return to clinic in 6 months.     [name], MD  Electronically Signed    DRB/MedQ  DD: 10/11/2006  DT: 10/11/2006  Job #: [id number]   cc:   Dale [place]  Joe Minchew

## 2010-09-13 NOTE — Assessment & Plan Note (Signed)
[facility] OFFICE NOTE   Angela Howe, Angela Howe                         MRN:          [id number]  DATE:03/07/2007                            DOB:          Feb 29, 1926    PRIMARY CARE PHYSICIAN:  Dr. Dale [place].   INTERVAL HISTORY:  Angela Howe is a 77 year old woman with a history of  coronary artery disease status post bypass grafting in January 2008.  She also has a history of hypertension, hyperlipidemia, breast cancer,  gastroesophageal reflux disease.   She returns today for routine followup.  She is doing well.  She is  walking 45 minutes to an hour every day in 15 minute intervals without  any chest pain or shortness of breath.  She has not had any lower  extremity edema.  Her energy is much improved.   CURRENT MEDICATIONS:  1. Calcium.  2. Synthroid 50 mcg daily.  3. Lipitor 10 mg alternating with 20 mg every other day.  4. Nexium 40 a day.  5. Amitriptyline 25 mg at night.  6. Toprol 25.  7. Folic acid.  8. Celexa 20 mg a day.  9. Aspirin 81.   PHYSICAL EXAM:  She is well-appearing.  In no acute distress.  Ambulates  around the clinic without any respiratory difficulty.  Blood pressure is 125/70, heart rate is 78, weight 139.  HEENT:  Normal.  NECK:  Supple.  No JVD.  Carotids are 2+ bilaterally without any bruits.  There is no lymphadenopathy or thyromegaly.  CARDIAC:  PMI is not displaced.  She has a regular rate and rhythm with  a soft S4.  No murmur.  LUNGS:  Clear.  ABDOMEN:  Soft, nontender, nondistended.  No hepatosplenomegaly.  No  bruits.  No masses.  Good bowel sounds.  EXTREMITIES:  Warm with no cyanosis, clubbing, or edema.  No rashes.  NEURO:  She is alert and oriented x3.  Cranial nerves 2-12 are intact.  Moves all 4 extremities without difficulty.  Affect is pleasant.   ASSESSMENT AND PLAN:  1. Coronary artery disease.  This is stable.  No evidence of ischemia.      Continue  current medications.  2. Hypertension, well-controlled.  3. Hyperlipidemia followed by Dr. [name].  Goal LDL is less than 70.   DISPOSITION:  Will see her back in 6 months for routine followup.     [name], MD  Electronically Signed    DRB/MedQ  DD: 03/07/2007  DT: 03/07/2007  Job #: [id number]   cc:   Dale Bagnell

## 2010-09-13 NOTE — Assessment & Plan Note (Signed)
[facility] OFFICE NOTE   Angela Howe, Angela Howe                         MRN:          [id number]  DATE:01/11/2007                            DOB:          [birth year]    PRIMARY CARE PHYSICIAN:  Dr. Dale Pala.   Angela Howe is a delightful 77 year old woman with a history of coronary  artery disease, status post bypass grafting in January, 2008 by Dr.  Tyrone Sage.  She also has a history of hypertension and hyperlipidemia.  She comes in today for an unscheduled visit.   She says overall she was doing well, she was walking and feeling okay;  however, over the past two days, she has felt weak and not had any  energy and been sort of achy all over.  Yesterday, she went to physical  therapy and just felt that she could not complete the exercise, she felt  quite weak.  EMS was called, and it was noted that her heartbeat was a  little irregular.  She describes it as two regular beats and then one  funny beat followed by two regular beats, and one funny beat.  She  denies any fevers or chills.  She has not had syncope or presyncope.  No  chest pain or shortness of breath.  She says this feels very different  from her previous cardiac symptoms.  EMS' vital signs showed a blood  pressure of 134/70, heart rate 90, sats 99%, glucose 103.   EMS arrived yesterday and checked her out.  They said everything looked  good, and they were going to transfer her to the hospital, but she  refused.  She called Dr. Roby Lofts office, who referred her here for  evaluation.   CURRENT MEDICATIONS:  1. Calcium.  2. Synthroid 50 mcg daily.  3. Lipitor 10 daily.  4. Nexium 40 a day.  5. Amitriptyline 25 a day.  6. Metoprolol 25 a day.  7. Folic acid 1 mg a day.  8. Celexa  20 a day.  9. Aspirin 81 a day.   PHYSICAL EXAMINATION:  She is somewhat fatigued-appearing but in no  acute distress.  She ambulates around the clinic without any  respiratory  difficulty.  Blood pressure is 110/62.  Heart rate is 97.  Weight is 136.  HEENT:  Normal.  NECK:  Supple.  There is no JVD.  Carotids are 2+ bilaterally without  any bruits.  There is no lymphadenopathy or thyromegaly.  CARDIAC:  PMI is nondisplaced.  She has a regular rate and rhythm with  occasional ectopy.  No murmur.  Soft S4.  LUNGS:  Clear.  ABDOMEN:  Soft, nontender, nondistended.  No hepatosplenomegaly.  No  bruits.  No masses.  Good bowel sounds.  EXTREMITIES:  Warm with no clubbing, cyanosis or edema.  No rashes.  She  is mildly pale.  NEURO:  She is alert and oriented x3.  Cranial nerves II-XII are intact.  Moves all four extremities without difficulty.   EKG:  Sinus rhythm at a rate of 98.  Just mild T  wave flattening  laterally, which is minimally different from before.   ASSESSMENT/PLAN:  1. Fatigue/weakness:  This is really nonspecific.  I am not sure what      the cause is.  She may have a viral syndrome.  There does not      appear to be an acute cardiac issue here.  She does have some      ectopy, and I suspect she may be having premature ventricular      contractions.  At this point, we will go ahead and get some      baseline labs, including a CBC, a BMET, a thyroid panel, and a      urinalysis and culture.  If the symptoms continue, we will place a      48-hour Holter monitor on her to rule out any significant      underlying arrhythmias.  2. Coronary artery disease:  This appears stable.  No evidence of      acute ischemia.  Symptoms are quite different from previous cardiac      issues.  We will continue current therapy.  3. Hypertension:  Well controlled.   DISPOSITION:  We will await the results of her lab work.     [name], MD  Electronically Signed    DRB/MedQ  DD: 01/11/2007  DT: 01/11/2007  Job #: [id number]

## 2010-09-13 NOTE — Assessment & Plan Note (Signed)
[facility] OFFICE NOTE   CLARE, FENNIMORE                         MRN:          [id number]  DATE:05/27/2008                            DOB:          [birth year]    PRIMARY CARE PHYSICIAN:  Dale Hankinson, M.D.   INTERVAL HISTORY:  Ms. Slinger is a very pleasant 77 year old woman with  history of coronary artery disease, status post bypass grafting in 2008.  She also has a history of hypertension, hyperlipidemia, breast cancer  status post left lumpectomy, and gastroesophageal reflux disease.   She returns today for a routine followup.  We saw her several months  ago.  At that time, she was having significant lightheadedness.  We put  some compression hose on her and asked her to liberalize her salt.  She  says this feels much better.  She has not had any dizziness whatsoever.  She also denies any chest pain or pressure.  She does have occasional  shortness of breath with major exertion, but this is unchanged.  She is  concerned today that she may have a recurrent nodule on her left breast.   REVIEW OF SYSTEMS:  Remainder of review of systems is negative except as  mentioned above.   CURRENT MEDICATIONS:  1. Calcium.  2. Synthroid 50 mcg a day.  3. Nexium 40 a day.  4. Amitriptyline 25 a day.  5. Celexa 20 a day.  6. Aspirin 81.  7. Appearex 2.5 a day.  8. Lipitor.  9. Metoprolol ER 25 a day.   PHYSICAL EXAMINATION:  GENERAL:  She is an elderly woman in no acute  distress, ambulates around the clinic without respiratory difficulty.  VITAL SIGNS:  Blood pressure is 122/62, heart rate 81, weight is 139.  HEENT:  Normal.  NECK:  Supple.  No JVD.  Carotids are 2+ bilaterally without any bruits.  There is no lymphadenopathy or thyromegaly.  CARDIAC:  PMI is nondisplaced.  Regular rate and rhythm.  No S4.  No  murmur.  LUNGS:  Clear.  ABDOMEN:  Soft, nontender, nondistended.  No hepatosplenomegaly.  No  bruits.  No masses.  Good bowel sounds.  EXTREMITIES:  Warm with no  cyanosis, clubbing, or edema.  No rash.  NEUROLOGIC:  Alert and oriented x3.  Cranial nerves II through XII are  intact.  Moves all 4 extremities without difficulty.  Affect is  pleasant.  BREASTS:  She does have a nodule in the upper left quadrant of her left  breast.  I did not feel any nodules on the right breast.   ASSESSMENT/PLAN:  1. Coronary artery disease.  This is stable.  No evidence of ischemia.  2. Orthostatic hypotension.  This is improved greatly with compression      stockings and salt liberalization.  3. Hyperlipidemia.  Goal LDL is less than 70, is followed by Dr.      [name].  4. Possible breast lump.  I have called over to the [facility] to ask if she will be seen in  next week or two for a      followup.  They will get in touch with her directly.   DISPOSITION:  She is doing well from a cardiac point of view.  We will  see her back in 6-9 months for a routine followup.     [name], MD  Electronically Signed    DRB/MedQ  DD: 05/27/2008  DT: 05/28/2008  Job #: [id number]

## 2010-09-13 NOTE — Assessment & Plan Note (Signed)
[facility] OFFICE NOTE   Howe, Angela Howe                         MRN:          [id number]  DATE:09/02/2007                            DOB:          1925/06/13    PRIMARY CARE PHYSICIAN:  Dr. Dale .   INTERVAL HISTORY:  Angela Howe is a delightful 77 year old woman with a  history of coronary artery disease, status post bypass grafting January  2008.  She also has a history of hypertension, hyperlipidemia, breast  cancer, and gastroesophageal reflux disease.   She returns today for routine follow-up.  Overall she is doing well.  She continues to have good days and bad days, but is essentially walking  to 30-40 minutes a day without any problems.  She denies any chest pain  or shortness of breath.  No problems with fluid overload.  Current  medications are calcium, Synthroid 50 mcg a day, Nexium 40 a day,  amitriptyline 25 at night, metoprolol 25 a day, Celexa 20 a day, aspirin  81 mg, Lipitor 20 a day.   PHYSICAL EXAMINATION:  GENERAL:  She is well-appearing in no acute  distress.  Ambulates around the clinic without respiratory difficulty.  VITAL SIGNS:  Blood pressure is 126/68, heart rate 178, weight 145.  HEENT is normal.  NECK:  Supple.  No JVD.  Carotids 2+ bilateral bruits.  There is no  lymphadenopathy or thyromegaly.  CARDIAC:  PMI is nondisplaced.  Regular rate and rhythm with a soft S4.  No murmurs.  LUNGS:  Lungs were clear.  ABDOMEN:  Soft, nontender, nondistended.  No hepatosplenomegaly.  No  bruits, no masses.  Good bowel sounds.  EXTREMITIES:  Warm, no cyanosis,  clubbing or edema.  No rash.  NEURO:  Alert and oriented x3.  Cranial nerves II-XII are intact.  Moves  all four extremities without difficulty.  Affect is pleasant.   ASSESSMENT/PLAN:  1. Coronary artery disease stable.  No evidence of ischemia.  Continue      current therapy.  2. Chronic hypertension, well-controlled.  3. Chronic hyperlipidemia.  This is followed by Dr. [name].  Goal LDL      is less than 70.   DISPOSITION:  Overall I think Angela Howe is doing well.  We will should  continue current therapies.     [name], MD  Electronically Signed    DRB/MedQ  DD: 09/02/2007  DT: 09/02/2007  Job #: [id number]

## 2010-09-13 NOTE — Assessment & Plan Note (Signed)
[facility] OFFICE NOTE   Angela Howe, Angela Howe                         MRN:          [id number]  DATE:12/02/2007                            DOB:          1925-08-13    PRIMARY CARE PHYSICIAN:  Dr. Dale Angela Howe.   INTERVAL HISTORY:  Angela Howe is a very pleasant 77 year old woman with  history of coronary artery disease status post bypass grafting in  January 2008.  She also has a history of hypertension, hyperlipidemia,  breast cancer, and gastroesophageal reflux disease.   She returns today for routine followup.  She continues to have  significant problems with lightheadedness.  Previously, she had a fall  and had a facial fracture, which was repaired.  She states that whenever  she gets up quickly, she feels quite lightheaded but has not passed out  except for that one time.  She wore monitor, which showed no event.  She  is not having chest pain or shortness of breath.  No palpitations.   CURRENT MEDICATIONS:  1. Calcium.  2. Synthroid 50 mcg a day.  3. Nexium 40 a day.  4. Amitriptyline.  5. Toprol 25 a day.  6. Celexa 20 a day.  7. Aspirin 81.  8. Lipitor 40 a day.  9. Appearex.  10.Tylenol.   PHYSICAL EXAMINATION:  GENERAL:  She is generally well appearing, in no  acute distress, ambulatory in the clinic without any respiratory  difficulty.  VITAL SIGNS:  Blood pressure orthostatics, lying down was 130/82,  standing up was 92/65, heart rate went from 83-94.  HEENT:  Normal.  NECK:  Supple.  No JVD, carotids are 2+ bilaterally.  There are no  bruits.  There is no lymphadenopathy or thyromegaly.  CARDIAC:  PMI is nondisplaced.  Regular rate and rhythm with soft S4, no  murmur.  LUNGS:  Clear.  ABDOMEN:  Soft, nontender, nondistended.  No hepatosplenomegaly.  No  bruits, no masses.  Good bowel sounds.  EXTREMITIES:  Warm with no  cyanosis, clubbing, or edema.  No rash.  NEURO:  Alert and oriented  x3.  Cranial nerves II-XII are intact.  Moves  all 4 extremities without difficulty.  Affect is pleasant.   ASSESSMENT AND PLAN:  1. Orthostatic hypotension.  I suspect this is clearly the etiology of      her symptoms and her fall.  There is no evidence of dysrhythmia at      this time.  We will start with salt liberalization and support      hose.  We will let her go 2 weeks and if this is not improving, we      will consider starting midodrine 2.5 mg b.i.d.  I told her to make      sure that she has a cane or a walker for support at all times to      prevent further falls.  2. Coronary artery disease is stable without any evidence of ischemia.      Continue current therapy.  3. Hyperlipidemia.  She will be due for  a lipid recheck in several      months.     Angela Howe. Bensimhon, MD  Electronically Signed    DRB/MedQ  DD: 12/02/2007  DT: 12/03/2007  Job #: [id number]   cc:   Angela Howe

## 2010-09-16 NOTE — Assessment & Plan Note (Signed)
[facility] OFFICE NOTE   Angela, Howe                         MRN:          [id number]  DATE:07/11/2006                            DOB:          1926/03/30    INTERVAL HISTORY:  Angela Howe is a very pleasant 77 year old woman with a  history of coronary artery disease status post coronary bypass grafting  on May 30, 2006 by Dr. Tyrone Sage, postoperative course was  complicated by atrial fibrillation and bilateral plural effusions. She  also has a history of hypertension and hyperlipidemia. She is intolerant  of statin medications.   We saw her last week and she quite light-headed, dizzy, and orthostatic  by blood pressure. We stopped her Lasix and hydrated her some. She  returns today for routine follow up. She says she is doing much better.  She was able to walk for an hour yesterday without any chest pain or  shortness of breath, and she feels very well. She denies any lower  extremity edema, nor orthopnea or PND.   CURRENT MEDICATIONS:  1. Elavil.  2. Nexium 40 mg daily.  3. Celexa 20 mg daily.  4. Zetia 10 mg daily.  5. Aspirin 325 mg daily.  6. Folic acid.  7. Amiodarone 200 mg daily.  8. Toprol 50 mg daily.   PHYSICAL EXAMINATION:  GENERAL: She is well appearing in no acute  distress. She ambulates around the clinic without respiratory  difficulty.  VITAL SIGNS: Blood pressure 100/64, heart rate 68, her weight is 133  which is up 4 pounds from last week.  HEENT: Sclerae anicteric, EO MI, there is no xanthelasmas, mucous  membranes are moist.  NECK: Supple. No JVD. Carotids are 2+ bilaterally without any bruits.  There is no lymphadenopathy or thyromegaly.  CARDIAC: Regular rate and rhythm, soft S4, no murmur.  LUNGS: Clear.  ABDOMEN: Soft and nontender, nondistended, there is no  hepatosplenomegaly, no bruits, no masses, good bowel sounds.  EXTREMITIES: Warm with no cyanosis, clubbing,  or edema. No rashes.  NEUROLOGIC: She is awake, alert, and oriented x3. Cranial nerves 2-12  are intact. She moves all 4 extremities without any difficulty.   ASSESSMENT AND PLAN:  1. Coronary artery disease status post bypass. She is doing quite well      without any evidence of ischemia. Continue current regimen.  2. Postoperative atrial fibrillation. This has resolved. We can stop      her Amiodarone.  3. Hyperlipidemia. Unfortunately, she is intolerant of statins. She is      maintained on Zetia. She will have her lipids and liver panel      checked with Dr. [name] and she will forward me these results.   DISPOSITION:  Return to clinic in 3 months. Of note, she does not want  to participate in [facility] due to the explicit questionnaire they  sent her, however, I told her that as she is walking regularly on her  own this should be sufficient.     [name], MD  Electronically Signed  DRB/MedQ  DD: 07/11/2006  DT: 07/13/2006  Job #: [id number]   cc:   Dale Hoopa

## 2010-09-16 NOTE — Assessment & Plan Note (Signed)
[facility] OFFICE NOTE   NAME:SCOTTSokhna, Christoph                         MRN:          [id number]  DATE:06/27/2006                            DOB:          1925-07-24    INTERVAL HISTORY:  Ms. Mayfield is a 77 year old woman with a history of  coronary artery disease, status post recent bypass surgery, who presents  today for an unscheduled visit due to weakness.  I saw her about a week  ago, at which time she was feeling somewhat weak, but otherwise doing  okay.  I did have some concern over pleural effusion on the left side.  We ordered a chest x-ray, and this did indeed show a moderate left-sided  effusion.  She is scheduled to see Dr. Tyrone Sage tomorrow.  She came in  today for a chest x-ray downstairs, and at that time, she said when she  stood up out of her chair her legs felt very weak.  She did not feel  presyncopal.  There was no chest pain or shortness of breath, she just  felt weak.  She said this morning she was able to walk to her mailbox  without any problems.  She has denied any recurrent chest or shoulder  pain.  No shortness of breath.  No cough.  No fevers.  No diarrhea.   CURRENT MEDICATIONS:  The same as my note from February 20th.   PHYSICAL EXAMINATION:  She is an elderly woman, in no acute distress.  She ambulates around the clinic slowly without any respiratory  difficulty.  Blood pressure was 104/60 sitting, and 96/58 standing.  Heart rate is  55.  HEENT:  Sclerae anicteric.  EOMI.  There is no xanthelasma.  Mucous  membranes are moist.  Oropharynx is clear.  NECK:  Supple.  No JVD.  Carotids are 2+ bilaterally without bruits.  There is no lymphadenopathy or thyromegaly.  CARDIAC:  She is bradycardiac and regular.  No murmurs.  LUNGS:  There is just very mild dullness at the left base.  ABDOMEN:  Soft, non-tender and non-distended.  No hepatosplenomegaly.  No bruits.  No masses.  EXTREMITIES:   Warm with no cyanosis, clubbing or edema.  NEUROLOGIC:  Alert and oriented x3.  Cranial nerves 2 through 12 are  intact.  Moves all 4 extremities without difficulty.  Affect is normal.   ASSESSMENT AND PLAN:  1. Weakness.  This is nonspecific.  I suspect we may have her slightly      over beta blocked.  We will change her Lopressor from 25 b.i.d. to      Toprol XL 25 mg once at night, and she how she does with this.  I      also encouraged her to make sure she is taking adequate p.o.      intake.  2. Coronary artery disease, status post coronary artery bypass graft.      She is scheduled to see Dr. Tyrone Sage tomorrow, and hopefully he      will clear her for cardiac rehab.  She is  otherwise stable.  3. Left pleural effusion by exam.  I suspect this is somewhat better.      We will get a repeat x-ray today, and she will follow up with Dr.      Tyrone Sage tomorrow.   DISPOSITION:  She will see me back in 2 weeks.     [name], MD  Electronically Signed    DRB/MedQ  DD: 06/27/2006  DT: 06/27/2006  Job #: [id number]   cc:   Dale [place]

## 2010-09-16 NOTE — Assessment & Plan Note (Signed)
[facility]                            CARDIOLOGY OFFICE NOTE   Angela Howe, Angela Howe                 MRN:          [id number]  DATE:04/19/2006                            DOB:          11-26-1925    Primary cardiologist:  Dr. Arvilla Meres.  Primary care physician:  Dr. Dale Os[place]go   PATIENT PROFILE:  A 77 year old white female with no prior history of  coronary artery disease, who presents for evaluation following abnormal  stress test.   PROBLEM LIST:  1. Left neck and shoulder discomfort.      a.     April 09, 2006, exercise Myoview.  Exercise time 3       minutes and 30 seconds, achieving 98% of maximum predicted heart       rate, EF 71%.  Imaging revealed evidence of anterior apical       hypoperfusion with stress with partial redistribution with rest       indicative of anterior apical ischemia.  Test was read by Dr.       Garwin Brothers.  2. Syncope.  April 09, 2006, 2D echocardiogram, EF 55% with mild LVH and trace  pericardial effusion, mild MR, mild TR.  1. Hyperlipidemia.  2. Peptic ulcer disease.  3. Gastroesophageal reflux disease.  4. Osteoarthritis.  5. Degenerative disc disease.  6. Low back pain.  7. History of cervical bulging discs.  8. Seasonal allergies.  9. History of left breast cancer, status post left lumpectomy and      radiation in 2006.  10.Status post hysterectomy in 1970.  11.Status post appendectomy.  12.Chronic sinusitis status post sinus surgery x2.  13.Status post right rotator cuff repair, approximately 7 years ago.  14.Depression.   HISTORY OF PRESENT ILLNESS:  A 77 year old white female with the above  problem list, who reports a 2- to 3-year history of rest and exertional  left neck and shoulder discomfort, occurring approximately 3 times per  month, rated as 4 out of 10 without any associated shortness of breath,  nausea, diaphoresis, or presyncope lasting 3-4 minutes and resolving  spontaneously.  Despite this, she has been very active over her  lifetime, currently walking for 30 minutes daily without any significant  limitations, and notably without any chest discomfort, arm or neck  discomfort or dyspnea.  Over the past 2 years, she has also been having  episodes of presyncope and syncope, which are preceded by severe nausea,  often vomiting, as well as chills and marked diaphoresis and  lightheadedness.  These episodes occur approximately once every 2 months  or so, and are always preceded by prolonged standing, and sometimes with  walking.  She has actually experienced syncope following these episodes  on 4 occasions, but has been able to abate her symptoms more times than  not by simply sitting down.  She was evaluated by a 24-hour Holter  monitor in July of 2006 which primarily showed a sinus rhythm with  periods of sinus arrhythmia and sinus tachycardia with a brief run of  SVT and PACs.  This was not felt to be a significant finding, and  as  noted, she has continued to have these symptoms.  She cannot predict  when an episode is going to occur, only to say that it is more likely to  occur with periods of prolonged standing.  Secondary to her syncope, she  was seen by Dr. Graciela Husbands for an electrophysiological evaluation on November  28 of this year, and he felt that her symptoms were primarily  neurocardiogenic in nature, and advised that she maintain good volume  status, and even increase her salt intake some.  She has done this and  not had any recurrent symptoms of syncope.  He also obtained a 2D  echocardiogram which is outlined in the problem list, but basically is  showing normal LV function.  Finally, an exercise Myoview was obtained  secondary to complaints of left shoulder and neck discomfort over the  past 2-3 years, and, unfortunately, this revealed anterior apical  ischemia.  She now presents today for precardiac catheterization  evaluation.    ALLERGIES:  PREDNISONE CAUSES RESTLESSNESS, FOSAMAX CAUSES DIZZINESS,  ZOCOR, LIPITOR AND PRAVACHOL CAUSE SIGNIFICANT MYALGIAS AND ABDOMINAL  DISCOMFORT.   HOME MEDICATIONS:  1. Elavil 30 mg nightly.  2. Calcium 500 mg plus D daily.  3. Nexium 40 mg daily.  4. Celexa 20 mg daily.  5. Zetia 10 mg daily.  6. Aspirin 81 mg daily.   FAMILY HISTORY:  Mother died of old age at age 74.  Father died of  spinal meningitis at age 74.  She had a total of 9 brothers and sisters.  She had 1 brother who died of an MI, another who died of CVA, and  another brother who died of pancreatic cancer.  There was no diabetes in  her siblings or her parents.   SOCIAL HISTORY:  She lives in [place].  She is widowed and now lives  by herself.  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  She  has 2 grown children.  She denies any alcohol, tobacco, or drug use, and  walks daily for 30 minutes on a flat track without any discomfort or  limitations.   REVIEW OF SYSTEMS:  Positive for presyncope and syncope as outlined  above, with associated nausea, vomiting, and diaphoresis.  Positive for  left neck and shoulder discomfort as well as low back pain.  She does  experience depression since the death of her husband.  Otherwise, all  systems reviewed and negative.   PHYSICAL EXAMINATION:  Blood pressure 130/68.  Heart rate 70,  respirations 16.  She is afebrile.  Weight is 134.08 pounds.  Pleasant white female in no acute distress.  Awake, alert and oriented  times 3.  HEENT: Atraumatic, normocephalic.  NEURO:  Grossly intact and nonfocal.  SKIN:  Warm and dry without lesions or masses.  NECK:  Normal carotid upstrokes, no bruits, jugular venous distension.  LUNGS:  Respirations regular and unlabored.  Clear to auscultation.  CARDIAC:  Regular S1, S2, no S3, S4 with a soft systolic murmur at the  left upper sternal border.  ABDOMEN:  Round, soft, nontender, nondistended.  Bowel sounds  present x4.  EXTREMITIES:  Warm, dry, pink.  No cyanosis, clubbing, or edema.  Dorsalis pedis posterior tibial pulses 1+ and equal bilaterally.  There  are no femoral bruits noted.   ACCESSORY CLINICAL FINDINGS:  CBC, BMET, PT, PTT, and chest x-ray are  pending and scheduled for December 21st.   ASSESSMENT/PLAN:  1. History of left shoulder and neck  discomfort.  This is fairly      atypical and does not occur during her daily walks, despite that      she has had an abnormal Myoview revealing anterior apical ischemia.      As a result, we have scheduled her for a left heart cardiac      catheterization to take place April 27, 2006 at [facility] with Dr. Gala Romney.  We have advised her to      continue her aspirin and Zetia.  She has been throroughly apprised      of the risks/benefits of cath including stroke, death, MI, vascular      damage, renal failure, bleeding, allergic reactions.  2. Syncope.  She has normal LV function on 2D echocardiogram, thus      making ventricular arrhythmia less likely.  Of note, symptoms      primarily occur with prolonged periods of standing, and at this      point felt to be neurocardiogenic in nature, although given her      abnormal functional study, one must question possible anginal      equivalent.  Per Dr. Graciela Husbands, she is to continue with adequate      hydration, as well as a slight increase in her salt intake.  If her      catheterization were to turn out to be normal and she had recurrent      syncope,  Dr. Graciela Husbands would consider placement of a implantable loop      monitor to clearly define what is going on from an      electrophysiologic standpoint.  3. Peptic ulcer disease/gastroesophageal reflux disease.  No      complaints, continue Nexium.  4. Depression.  She remains on Elavil and Celexa per her primary care      physician.  5. Hyperlipidemia.  She has an intolerance to multiple statins, but      currently is  tolerating Zetia well, and this is followed by her      primary care.  6. Osteoarthritis.  She remains on calcium therapy and offers no      complaints today.  7. Disposition.  Angela Howe will undergo electrocardiac catheterization      December 28th with Dr. Gala Romney, and will likely follow up      approximately 2 weeks later for post cath groin check.      Nicolasa Ducking, ANP  Electronically Signed      [name], MD  Electronically Signed   CB/MedQ  DD: 04/19/2006  DT: 04/19/2006  Job #: [id number]   cc:   Dale Rutledge

## 2010-09-16 NOTE — Assessment & Plan Note (Signed)
[facility] OFFICE NOTE   Angela Howe, Angela Howe                           MRN:          [id number]  DATE:07/04/2006                            DOB:          1925/12/14    INTERVAL HISTORY:  [name] is a very pleasant 77 year old woman with a  history of coronary artery disease status post coronary artery bypass  grafting May 30, 2006, by Dr. Tyrone Sage.  Her postoperative course  was complicated by atrial fibrillation which was treated with  amiodarone, as well as bilateral thoracentesis.  Last week she saw Dr.  Tyrone Sage for followup on her effusion.  He thought he was doing okay but  did restart her on some Lasix.  Initially she felt great, was able to do  all her activities of daily living and did a lot of walking.  However,  over the past 2 days has felt very weak and lightheaded.  She denies any  chest or shoulder pain, any dyspnea.   CURRENT MEDICATIONS:  1. Elavil 30 mg a day.  2. Nexium 40 a day.  3. Celexa 20 a day.  4. Zetia 10 a day.  5. Aspirin 325 a day.  6. Folic acid.  7. Amiodarone 200 a day.  8. Toprol-XL 50 q.h.s.  9. Lasix unknown dose.   PHYSICAL EXAMINATION:  GENERAL:  She does appear weak.  VITAL SIGNS:  Initially, blood pressure was 88/56 sitting and then in  standing her blood pressure dropped to the 60s and she was unable to  stand for more than a minute.  She sat back down and feels better.  HEENT:  Sclerae anicteric, EOMI, there are no xanthelasmas, mucous  membranes are moist.  NECK:  Supple.  No JVD.  Carotids are 2+ bilaterally without any bruits.  There is no lymphadenopathy or thyromegaly.  CARDIAC:  Regular rate and rhythm plus S4, no murmur.  LUNGS:  Clear.  Mild decreased breath sounds in the left base but this  is minimal.  ABDOMEN:  Soft, nontender, nondistended.  There is no  hepatosplenomegaly, no bruits, no masses, good bowel sounds.  EXTREMITIES:  Warm with no  cyanosis, clubbing or edema.  There are no  rashes.  NEUROLOGIC:  Alert and oriented x3.  Cranial nerves II-XII are intact.  Moves all four extremities without difficulty.   ASSESSMENT AND PLAN:  1. Weakness and orthostatic hypotension.  I suspect she has just been      over-diuresed.  We will stop her Lasix.  I have given her some      fluids here orally and told her to liberalize her salt and fluid      intake over the next few days.  She will see me back in clinic next      week just to touch base.  2. Coronary artery disease status post bypass.  She is quite stable      without any evidence of ischemia.  Continue current therapies.     [name], MD  Electronically Signed    DRB/MedQ  DD: 07/04/2006  DT: 07/04/2006  Job #: [id number]

## 2010-09-16 NOTE — Assessment & Plan Note (Signed)
[facility] OFFICE NOTE   AAHANA, ELZA                         MRN:          [id number]  DATE:06/20/2006                            DOB:          13-Feb-1926    INTERVAL HISTORY:  Ms. Spittler is a delightful 77 year old woman with a  history of a coronary artery disease status post coronary artery bypass  grafting May 30, 2006.  The procedure went well.  However, it was  complicated by some postoperative atrial fibrillation as well as right  effusion.  She was treated with amiodarone and her atrial fibrillation  resolved.  She also underwent a right thoracentesis.  She returns today  for routine followup.  She is doing fantastic.  She is ambulating around  the house without any dyspnea or shoulder pain, which was her angina.  She is getting stronger.  She has no lower extremity edema, no  orthopnea, no PND, no cough, fevers, or chills.   PAST MEDICAL HISTORY:  Also significant for hypertension, hyperlipidemia  and she has been intolerant of STATINS.  Also a history of syncope with  a normal ejection fraction and breast cancer status post left  lumpectomy.   CURRENT MEDICATIONS:  1. Elavil 30 mg q.h.s.  2. Calcium.  3. Nexium.  4. Celexa 20.  5. Zetia 10.  6. Aspirin 325.  7. Lopressor 25 b.i.d.  8. Folic acid.  9. Amiodarone 200 b.i.d.  10.Oxygen 2 L nasal cannula.   PHYSICAL EXAMINATION:  GENERAL:  She is an elderly woman in no acute  distress.  Ambulates around the clinic slowly without any respiratory  difficulty.  VITAL SIGNS:  Her saturations remain at 97% off her oxygen.  Blood  pressure is 116/60 with a heart rate of 73, weight is 134 which is her  preoperative weight.  HEENT:  Sclerae anicteric, EOMI.  There are no xanthelasmas.  Mucous  membranes are moist, oropharynx is clear.  NECK:  Supple.  No JVD.  Carotids are 2+ bilaterally without any bruits.  There is no lymphadenopathy or  thyromegaly.  CARDIAC:  She has a regular rate and rhythm with no obvious murmurs,  rubs or gallops.  Her sternotomy incision looks great.  LUNGS:  Clear with mildly decreased breath sounds, mild dullness to the  left base.  ABDOMEN:  Soft, nontender, nondistended.  No hepatosplenomegaly, no  bruits, no masses, good bowel sounds.  Extremities are warm with no  cyanosis, clubbing or edema.  NEUROLOGIC:  Alert and oriented x3.  Cranial nerves II-XII are intact.  Moves all four extremities without difficulty.  Affect is bright.   EKG shows normal sinus rhythm at a rate of 73 with mild T-wave  flattening anteriorly.   ASSESSMENT AND PLAN:  1. Coronary artery disease.  She is quite stable after her bypass      surgery.  Continue current therapy.  I have reminded her of the      need to go to cardiac rehab.  She will follow up with Dr. Tyrone Sage.  2. Postoperative atrial fibrillation.  She  is maintaining sinus      rhythm.  Decrease amiodarone to 200 mg once a day.  We can stop it      at her next visit.  3. Hyperlipidemia.  Unfortunately, she has been intolerant of statins.      We will continue her Zetia.  4. Hypertension, well controlled.  Continue current therapy.   DISPOSITION:  We will see her back in 2 months for a routine followup.     [name], MD  Electronically Signed    DRB/MedQ  DD: 06/20/2006  DT: 06/20/2006  Job #: [id number]   cc:   Sheliah Plane, MD

## 2010-09-16 NOTE — Discharge Summary (Signed)
NAME:  Angela Howe, Angela Howe                  ACCOUNT NO.:  [id number]   MEDICAL RECORD NO.:  [id number]          PATIENT TYPE:  INP   LOCATION:  2001                         FACILITY:  [facility]   PHYSICIAN:  Sheliah Plane, MD    DATE OF BIRTH:  [birth year]   DATE OF ADMISSION:  05/30/2006  DATE OF DISCHARGE:                               DISCHARGE SUMMARY   ANTICIPATED DATE OF DISCHARGE:  June 07, 2006.   ADMISSION DIAGNOSIS:  Coronary occlusive disease.   DISCHARGE/SECONDARY DIAGNOSES:  1. Coronary occlusive disease status post coronary artery bypass      grafting.  2. Postoperative right pleural effusions status post thoracentesis.  3. Hyperlipidemia.  4. Peptic ulcer disease/gastroesophageal reflux disease.  5. Osteoarthritis.  6. Degenerative disk disease, especially involving the cervical disk,      with left arm and neck pain.  7. A history of low back pain.  8. Seasonal allergies.  9. A history of breast cancer status post left lumpectomy and      radiation in 2006.  10.A history of hysterectomy in 1970.  11.A history of appendectomy.  12.Chronic sinusitis with a history of sinus surgery.  13.Rotator cuff repair 7 years ago.  14.Depression.  15.Postoperative atrial fibrillation, now in sinus rhythm.  16.Postoperative hypokalemia, supplemented.  17.Postoperative fluid volume excess.  18.Mild postoperative acute blood loss anemia.   ALLERGIES:  PREDNISONE and FOSAMAX.   PROCEDURES:  1. May 30, 2006:  Coronary artery bypass grafting x3 using left      internal mammary to the left anterior descending, reverse saphenous      vein graft to the diagonal coronary artery, reverse saphenous vein      graft to the posterior descending coronary artery, right leg      endoscopic vein harvesting, surgeon Dr. Sheliah Plane.  2. June 04, 2006:  Ultrasound-guided right thoracentesis by      Interventional Radiology.   BRIEF HISTORY:  Angela Howe is a 77 year old female who was  seen by Dr.  Sheliah Plane in consultation for coronary artery bypass grafting  surgery following a Myoview exercise test on December of 2007 and  cardiac catheterization from April 27, 2006 done at [facility]  Her  consultation visit she reported that she fell 2 weeks ago and fractured  her sacrum and since that time she had limited ability to get up and  down.  She was having pain with ambulation but was able to rise from  lying to sitting position.  Her cardiac history started at least 6-8  months ago.  Patient reported at least 4 episodes of syncope over the  past 2 years.  This led to evaluation by Dr. Graciela Husbands and subsequent  evaluation by Dr. Gala Romney.  No definite cause of the syncopal episodes  were noted; however, the patient did have a positive stress test leading  to cardiac catheterization.  She denied any definite chest pain but did  have some left jaw and arm pain with exertion.  She denied exertional  shortness of breath.  She had at least 3 or 4 episodes of nocturnal  dyspnea over the past 2 years.  Her most recent syncopal episode was in  October of 2007.  She denied palpitations or lower extremity edema.  She  had no known prior myocardial infarction.  Echocardiogram done on  December 10 showed an ejection fraction of 55%, trace pericardial  effusion, mild MR, and mild TR.  Her catheterization film showed ostial  LAD at 60% stenosis and proximal LAD at 99%, distal LAD at 50%, 80%  diagonal, 40-50% right coronary, and 40-50% posterior descending.  The  lesions were felt unsuitable for angioplasty.  Dr. Tyrone Sage recommended  coronary artery bypass graft surgery.   HOSPITAL COURSE:  Angela Howe was electively admitted to [facility] on May 30, 2006 and underwent coronary artery bypass graft  surgery as discussed above.  Postoperatively, she was transferred to the  surgical intensive care unit.  She was extubated by that evening.  She  remained in the intensive  care unit for the first 2 days  postoperatively.  Tubes and invasive monitoring was discontinued during  this time.  She was monitored for mild postoperative anemia but did not  require additional transfusion.  She was started on short-term diuresis  for mild fluid volume excess.  On postoperative day 3, she was on  telemetry in 2000 where it is anticipated she will remain until  discharge.  She did develop some atrial fibrillation with rapid  ventricular response requiring an amiodarone drip.  She was changed to  pills the following day as she had converted to a normal sinus rhythm.  She did have some postoperative nausea which was resolved with Reglan  and p.r.n. antiemetics.  Follow-up chest x-ray did show a large pleural  effusion and she did require right thoracentesis by Interventional  Radiology on February 4.  Up until this point she was requiring  supplemental oxygen, but it is anticipated that we will be able to wean  her from supplemental oxygen at discharge.  Her follow-up chest x-ray  shows small right residual effusions.  Saturations were 97% on 3 L.  Currently, her oxygen is being weaned and she has been at 91% on 2 L.  We will arrange supplemental oxygen if needed.  Otherwise, her vital  have remained stable.  Most recently, blood pressure 105/52, heart rate  83 and sinus rhythm, and she has been afebrile.  She has required some  supplementation for mild hypokalemia with a potassium of 3.4.  Per  cardiac surgery protocol, she was placed on Accu-Cheks postoperatively  with sliding scale insulin.  This has since been discontinued as her non-  fasting sugars were stable between 110 and 130.  While in 2000, Ms.  Howe made steady progress.  She was seen daily by Cardiac Rehab and was  noted to have a steady gait.  She has been able to tolerate a regular  diet, and bowel and bladder have been functioning appropriately.  Her incisions have been healing well without signs of  infection.  She has  not had any further atrial fibrillation.  Her lungs have been clear,  although somewhat diminished at the right base, but, again, was improved  after thoracentesis.  She has had minimal lower extremity edema, and her  pain has been controlled on oral medication.  If Ms. Sweatt continues to  make steady progress, anticipate she will be ready for discharge home on  postoperative day 7 or 8, February 6 or June 07, 2006.   LABORATORY DATA:  Most recent  labs show a white blood count of 6.3,  hemoglobin 10.8, hematocrit 31.4, platelet count 202, sodium 136,  potassium 3.4 which is supplemented, chloride 103, CO2 of 28, BUN 12,  creatinine 0.96, blood glucose 83, BNP on February 4 was 775.   DISCHARGE MEDICATIONS:  1. Coated aspirin 325 mg p.o. daily.  2. Lopressor 25 mg p.o. b.i.d.  3. Oxycodone 5 mg 1-2 tablets p.o. q.4-6h. p.r.n. pain.  4. Amitriptyline 30 mg p.o. q.h.s.  5. Celexa 20 mg p.o. daily.  6. Nexium 40 mg p.o. daily.  7. Zetia 10 mg p.o. daily.  8. Folic acid 1 mg p.o. daily.  9. Amiodarone 200 mg 2 tablets p.o. b.i.d. x10 more days then 1 tablet      p.o. b.i.d.   DISCHARGE INSTRUCTIONS:  She is to avoid driving or heavy lifting more  than 10 pounds.  She should continue daily walking and breathing  exercises.  She is to follow a low-fat/low-salt diet.  She may shower  and clean her incisions gently with soap and water.  She is to notify  the [facility] office if she develops fever greater than 101 or redness or  drainage from her incision site or increased shortness of breath or  edema.   FOLLOWUP:  She is to follow up with Dr. Sheliah Plane at the [facility]  office in approximately 3 weeks.  The office will contact her regarding  specific appointment date and time.  She is to also call and schedule a  2-week followup with Dr. Graciela Husbands and have a chest x-ray as well at this  appointment.      Jerold Coombe, P.A.      Sheliah Plane, MD   Electronically Signed    AWZ/MEDQ  D:  06/05/2006  T:  06/06/2006  Job:  [id number]   cc:   Sheliah Plane, MD  Duke Salvia, MD, [facility]  [name], MD  [facility]

## 2010-09-16 NOTE — Letter (Signed)
March 28, 2006    Angela Howe  [address]  [place], [place] [address]   RE:  [name], Howe  MRN:  [id number]  /  DOB:  06/18/1925   Dear Westley Hummer:   I am glad to hear that you are feeling better.  I hope you and your  family had a good Thanksgiving.   It was a pleasure to see Angela Howe today in consultation  regarding her recurrent syncope.  As you know, she has a long-standing  history of recurrent episodes of syncope, which are characterized by a  stereotypical prodrome notable for nausea, diaphoresis, and flushing.  There is also lightheadedness and weakness.  There are no associated  palpitations.  The prodrome was quite prolonged, lasting minutes.  If  she sits down, she can abrogate the spells, but usually she tries to  persist and then she actually loses consciousness.  There is some  residual orthostatic intolerance.  She also has a history of shower  intolerance and bending intolerance.  She does not have bath  intolerance.   She does not have a history of hypertension.  She is salt deplete, but  her volume status is quite good, based on her urine color.   She notes that her spells have become increasingly frequent of late.   It is notable that she takes amitriptyline for sleep, but this has been  long-standing.   Interestingly, she has failed not only with prolonged standing, but also  occurring while walking, even on the heart track.   This is notable because she also has a history of increasingly frequent  problems with bilateral shoulder discomfort associated with exertion  that lasts 5-10 minutes and is relieved by rest.  She has also noted  increasing shortness of breath.  She has also had episodes over the last  6 months, 3 times specifically, where she is awakened at night abruptly  short of breath, feeling like she is going to die.  These spells  typically last 15-20 minutes.  These are unassociated with shoulder  discomfort.   Her cardiac risk factors are notable for hypercholesterolemia and a  family in her sister.  She does not use cigarettes, have diabetes or  have hypertension, as noted.   PAST MEDICAL HISTORY:  In addition to the above is notable for:  1. Peptic ulcer disease with GE reflux.  2. Arthritis.  3. History of breast cancer status post radiation therapy.  4. Allergies.  5. Degenerative disk disease.   PAST SURGICAL HISTORY:  1. Hysterectomy.  2. Appendectomy.  3. Sinus surgery.   SOCIAL HISTORY:  She is retired from the dairy.   CURRENT MEDICATIONS:  1. Aspirin.  2. Zetia 10.  3. Nexium 40.  4. Elavil 30.  5. Celexa (I failed to ask her what the Celexa was for).   ALLERGIES:  1. PREDNISONE.  2. FOSAMAX.   SOCIAL HISTORY:  She is married by widowed.  She has 2 children and  grandchildren.   PHYSICAL EXAMINATION:  GENERAL:  She is an elderly Caucasian female  appearing her stated age of 80.  VITAL SIGNS:  Her blood pressure is 120/60 with a pulse of 68.  Upon  sitting, her heart rate went to 82 with blood pressure of 120/64, and on  standing for up to 5 minutes, her heart rate increased only modestly to  84.  Her blood pressure went to 112/56, and she felt very weak and  unsteady.  There  was some recovery over the last minute or two of  standing.  HEENT:  Demonstrated no icterus or xanthoma.  NECK:  The neck veins were flat.  The carotids were brisk and full  bilaterally without bruits.  BACK:  Without kyphosis or scoliosis.  LUNGS:  Clear.  HEART:  Heart sounds were regular.  There was a 2/6 systolic murmur.  ABDOMEN:  Soft with active bowel sounds without midline pulsation,  hepatomegaly or bruit.  EXTREMITIES:  Femoral pulses were 2+, distal pulses were intact.  There  was no clubbing, cyanosis, or edema.  NEUROLOGIC:  Cranial nerves were grossly normal.  SKIN:  Warm and dry.   ELECTROCARDIOGRAM:  The electrocardiogram dated today demonstrated sinus  rhythm at about 78  beats per minute.  The intervals were 0.14/0.08/.36.  The electrocardiogram was otherwise normal.   HOLTER MONITOR:  The Holter monitor previously had demonstrated  nonsustained atrial arrhythmias.   IMPRESSION:  1. Recurrent syncope - neurocardiogenic.  Question trigger.  2. Exertional shoulder discomfort, rule out angina.  3. Nocturnal dyspnea, question mechanism, question cardiac (i.e.      ischemia versus arrhythmia versus gastrointestinal).  4. History of gastroesophageal reflux disease and peptic ulcer      disease.  5. Amitriptyline therapy, question contributing to #1.   Angela, Mrs. Blackwell has neurocardiogenic syncope almost certainly by  history.  The issues in my mind are why are then becoming increasingly  frequent and what is the trigger.  The possible triggers can include  arrhythmias and volume depletion related to orthostatic stress, etc.  I  am struck by the fact that she has spells while walking.  This is  unusual, as the use of the leg muscles typically is associated with  return of venous blood to the central cavity and often, but not always,  abrogates these spells.   I have suggested that what we do is try to treat the orthostatic  intolerance, neurocardiogenic syncope with salt and water repletion.  Her water status is pretty good.  I have asked her to add some salt back  to her diet.  In addition, I have asked her to put some 4 inch blocks  underneath the head of her bed.   As it relates to the other symptoms and potential contributions, I have  asked that we get an echo and a Myoview study, both of which I have  scheduled here at the [facility].  In the event that her stress  test is abnormal, a catheterization would be recommended.   I will plan to look at those results and talk with you about them  afterwards.   Angela, thanks very much for asking me to see her.   In the event that she continues to have syncope despite our  above intervention, she too may well be a candidate for a loop recorder,  looking for potential triggers (i.e. could she have an arrhythmia  related to her SVT).   Like I said, I will be back in touch with you about her.   Thanks very much for asking [place] to see her.    Sincerely,      Duke Salvia, MD, [facility]  Electronically Signed    SCK/MedQ  DD: 03/28/2006  DT: 03/28/2006  Job #: [id number]

## 2010-09-16 NOTE — Op Note (Signed)
NAME:  Angela Howe, LARNER NO.:  [id number]   MEDICAL RECORD NO.:  [id number]          PATIENT TYPE:  INP   LOCATION:  2001                         FACILITY:  [facility]   PHYSICIAN:  Sheliah Plane, MD    DATE OF BIRTH:  [birth year]   DATE OF PROCEDURE:  05/30/2006  DATE OF DISCHARGE:                               OPERATIVE REPORT   PREOPERATIVE DIAGNOSIS:  Coronary occlusive disease.   POSTOPERATIVE DIAGNOSIS:  Coronary occlusive disease.   SURGICAL PROCEDURE:  Coronary artery bypass grafting x3 with a left  internal mammary to the left anterior descending coronary artery,  reversed saphenous vein graft to the diagonal coronary artery, reversed  saphenous vein graft to the posterior descending coronary artery with  right Endovein harvesting.   SURGEON:  Dr. Tyrone Sage.   FIRST ASSISTANT:  Dr. Donata Clay.   SECOND ASSISTANT:  Coral Ceo, P.A.   BRIEF HISTORY:  The patient is a 77 year old female who in the fall of  2007 presented with episodes of syncope and was evaluated by Dr. Graciela Husbands  including stress testing and cardiac catheterization. At the time of  catheterization, she was found to have 80% PD lesion, a high-grade  stenosis - long stenosis to almost total occlusion of the LAD and  diagonal. The circumflex was a large vessel without significant disease.  Overall ventricular function was preserved. Because the patient on  stress test had a large anterior ischemia, coronary artery bypass  grafting was recommended to the patient. She had recently fallen and had  a sacral fracture of her coccyx which was gradually improving, and it  was decided to proceed with surgery.   DESCRIPTION OF PROCEDURE:  With Swan-Ganz and arterial line monitors in  place, the patient underwent general endotracheal anesthesia without  incident.  Chest and legs prepped with Betadine and draped in usual  sterile manner using the [employer] Endovein harvesting system. The vein  was harvested  from the right thigh and was of adequate quality and  caliber. Median sternotomy was performed. The left internal mammary  artery was dissected down as a pedicle graft. The distal artery  ___________ had good free flow. Pericardium was opened. Overall  ventricular function appeared preserved. The patient was systemically  heparinized. Ascending aorta was cannulated. The right atrium was  cannulated. The patient was placed on cardiopulmonary bypass 2.4 liters  per minute per meter squared. Sites of the anastomosis were selected,  dissected out of the epicardium. The patient's body temperature was  cooled to 30 degrees. Aortic cross clamp was applied and 500 mL of cold  blood potassium cardioplegia was administered with rapid diastolic  arrest of the heart. Myocardial septum temperature was monitored  throughout the cross clamp period. Attention was turned first to the  posterior descending coronary artery which was opened and admitted a 1-  mm probe. Using a running 7-0 Prolene, distal anastomosis was performed.  Attention was turned to the diagonal coronary artery which was opened  and was a small vessel, 1 mm in size. Using a running 7-0 Prolene,  distal anastomosis was performed. Attention  was then turned to the left  anterior descending coronary artery which was also a small vessel at 1.3  to 1.4 mm in size. Using a running 8-0 Prolene, the left internal  mammary artery was anastomosed to the left anterior descending coronary  artery. With release of the bulldog on the mammary artery, there was a  rise in myocardial septal temperature. Bulldog was placed back on the  mammary artery. Additional cold-blood cardioplegia was administered in  the root. With aortic cross clamp still in place, two punch aortotomies  were performed. Two vein grafts were anastomosed to the ascending aorta.  The aorta was evacuated from the grafts, and partial-occlusion clamp was  removed. Sites of anastomoses  were inspected and were free of bleeding.  The patient was then ventilated and weaned from cardiopulmonary bypass  without difficulty.  With decannulation of the right atrium, the cannula  was removed, and the patient had an area of tearing along the inner  surface of the atrium. This was repaired with pledgeted sutures, and the  remainder of decannulation was carried out with the operative field  hemostatic. Two atrial and two ventricular pacing wires were applied.  Graft markers were applied. Left pleural tube and a mediastinal drain  were left in place. Sternum was closed with a #6 stainless steel wire.  Fascia was closed with interrupted 0 Vicryl, running 3-0 Vicryl in the  subcutaneous tissue, and 4-0 subcuticular stitch in the skin edges. Dry  dressings were applied. Sponge and needle count were reported as correct  at the completion of the procedure. The patient tolerated the procedure  without obvious complications and was transferred to the surgical  intensive care unit for further postoperative care.      Sheliah Plane, MD  Electronically Signed     EG/MEDQ  D:  06/04/2006  T:  06/04/2006  Job:  [id number]   cc:   Duke Salvia, MD, [facility]

## 2010-09-16 NOTE — Consult Note (Signed)
NAME:  Angela Howe, MOYD NO.:  [id number]   MEDICAL RECORD NO.:  [id number]           PATIENT TYPE:   LOCATION:                                 FACILITY:   PHYSICIAN:  Sheliah Plane, MD         DATE OF BIRTH:   DATE OF CONSULTATION:  DATE OF DISCHARGE:                                 CONSULTATION   REQUESTING PHYSICIAN:  [name], MD.   Elenor Quinones CARDIOLOGIST:  Duke Salvia, MD, [facility]   PRIMARY CARE PHYSICIAN:  Dr. Dale Nixon   REASON FOR CONSULTATION:  Coronary occlusive disease.   HISTORY OF PRESENT ILLNESS:  The patient comes to the office today for  consideration for bypass surgery following Myoview exercise test  April 09, 2006 and cardiac catheterization dated April 28, 2007  done at [facility].  The patient now comes to the office today complaining  that she fell 2 weeks' ago and fractured her sacrum; and since that time  has had limited ability to get up-and-down.  She notes that it is not as  painful to ambulate, but to rise from laying or sitting position.   The patient's cardiac history starts at least 6 to 8 months' ago or  possibly longer.  She notes at least 4 episodes of syncope over the past  2 years.  This lead to evaluation by Dr. Graciela Husbands and subsequent evaluation  by Dr. Gala Romney.  A definite cause for the syncopal episodes was noted;  however, the patient did have a positive stress test leading to cardiac  catheterization.  Although she denies any definite chest pain, she does  have left jaw and arm pain with exertion.  She denies exertional  shortness of breath.  She has had at least 3-4 episodes of nocturnal  dyspnea over the past 2 years.  She has had syncopal episodes, as noted  above, with the most recent one in October 2007.  She denies  palpitations, denies lower extremity edema.  She has had no known prior  myocardial infarction.  She denies hypertension, does have  hyperlipidemia, denies smoking, does have a  positive family history, but  no early deaths from cardiac disease.  One sister had a heart attack at  age 26.  Brother died of cancer at age 37, another brother died of  cancer at age 20.  Another brother had myocardial infarction and died at  age 90.  One sister died of breast cancer at age 59.   PAST MEDICAL HISTORY:  Significant for left neck and shoulder pain,  positive Myoview on December 10, episodes of syncope.  Echocardiogram  done December 10 showed 55% ejection fraction, trace pericardial  effusion, mild MR and mild TR.  History of hyperlipidemia, history of  peptic ulcer disease, history of gastrointestinal reflux, history of  osteoarthritis, history of degenerative disk disease especially cervical  disk disease with left arm pain.  History of low back pain, seasonal  allergies.  History of br cancer status post left lumpectomy and  radiation in 2006.  Status post hysterectomy in 1970,  status post  appendectomy.  Chronic sinusitis with sinus surgery.  Status post  rotator cuff repair 7 years ago.  History of depression.  The patient is  widowed.  Lives alone.  Does have support from her daughter.   CURRENT MEDICATIONS INCLUDE:  1. Elavil 30 mg q.h.s.  2. Nexium 40 mg a day.  3. Celexa 20 mg a day.  4. Zetia 10 mg a day.  5. Hydrocodone recently for sacral pain.  6. Aspirin 81 mg a day.   ALLERGIES:  PREDNISONE causes jitteriness, FOSAMAX causes dizziness.  She has been on several different statins and was intolerant of them  because of stomach pain.   REVIEW OF SYSTEMS:  GENERAL:  Weight has been stable.  CONSTITUTIONAL:  Denies constitutional symptoms.  Denies amaurosis or TIAs, or chest  pain.  Left neck pain as noted above.  Denies hemoptysis or wheezing.  Has had episodes of nausea and vomiting usually associated with prodrome  prior to syncope.  History of depression.  Other review of systems are  negative.   PHYSICAL EXAMINATION:  VITAL SIGNS:  The patient's  blood pressure is  126/80, pulse of 90 and regular, respiratory rate is 18.  O2 saturation  is 96%.  She is 5 feet 4-1/2 inches tall and weighs 140 pounds.  GENERAL:  The patient is awake, alert, and neurologically intact.  HEENT:  Pupils are equal, round, and reacted to light.  NECK:  Without carotid bruits.  LUNGS:  Clear bilaterally.  CARDIAC EXAM:  Regular rate and rhythm without murmur or gallop.  ABDOMINAL EXAM:  Benign without palpable masses or tenderness.  EXTREMITIES:  The lower extremities have +2 DP and PT pulses.  There is  no evidence of significant varicosities in the lower extremities.  The  cardiac catheterization films are reviewed from April 27, 2006  revealing ostial LAD of 60% and a proximal LAD of 99%, distal LAD of  50%, 80% diagonal, 40-50% right coronary lesion, and 40-50% posterior  descending.   IMPRESSION:  Patient with positive stress test showing anterior  ischemia, episodes over the past 2 years of syncope, presents with  significant proximal LAD lesion, in the face of positive stress tests  and anterior ischemia.  The lesions are unsuitable for angioplasty;  agree with Dr. Gala Romney proceeding with coronary artery bypass grafting  to the LAD, diagonal, and possibly the right PD are indicated.  The  risks and options are discussed with the patient and her daughter in  detail, including the risk of death, infection, stroke, myocardial  infarction, bleeding, and blood transfusion.  The patient is willing to  proceed next week, January 30.  This should leave another almost 3-1/2  weeks since her __________ she is walking, if she has increasing pain  before then she will notify us and we will adjust the time of her  surgery.      Sheliah Plane, MD  Electronically Signed     EG/MEDQ  D:  05/24/2006  T:  05/24/2006  Job:  [id number]   cc:   [name], MD  [facility] [place]

## 2010-10-14 ENCOUNTER — Ambulatory Visit: Payer: Medicare Other | Admitting: Internal Medicine

## 2010-11-07 ENCOUNTER — Encounter: Payer: Self-pay | Admitting: Cardiovascular Disease

## 2010-11-08 ENCOUNTER — Ambulatory Visit (INDEPENDENT_AMBULATORY_CARE_PROVIDER_SITE_OTHER): Payer: Medicare Other | Admitting: Cardiovascular Disease

## 2010-11-08 ENCOUNTER — Encounter: Payer: Self-pay | Admitting: Cardiovascular Disease

## 2010-11-08 DIAGNOSIS — I2510 Atherosclerotic heart disease of native coronary artery without angina pectoris: Secondary | ICD-10-CM

## 2010-11-08 DIAGNOSIS — I959 Hypotension, unspecified: Secondary | ICD-10-CM

## 2010-11-08 DIAGNOSIS — R42 Dizziness and giddiness: Secondary | ICD-10-CM

## 2010-11-08 DIAGNOSIS — E785 Hyperlipidemia, unspecified: Secondary | ICD-10-CM

## 2010-11-08 DIAGNOSIS — R112 Nausea with vomiting, unspecified: Secondary | ICD-10-CM

## 2010-11-08 MED ORDER — FLUDROCORTISONE ACETATE 0.1 MG PO TABS: 0.1000 mg | ORAL_TABLET | Freq: Every day | ORAL | Status: AC

## 2010-11-08 NOTE — Assessment & Plan Note (Signed)
Currently with no symptoms of angina. No further workup at this time. Continue current medication regimen. 

## 2010-11-08 NOTE — Assessment & Plan Note (Signed)
Her nausea and malaise is likely secondary to hypotension. Some improvement with low dose Florinef. Will advance the medication as detailed above.

## 2010-11-08 NOTE — Assessment & Plan Note (Signed)
Her dizziness has improved on Florinef. She continues to have symptoms and still has a severely limited lifestyle. We have suggested she increase the Florinef to 0.1 mg daily. We have encouraged that she stay hydrated at all times. She does wear her thigh-high TED hose.

## 2010-11-08 NOTE — Assessment & Plan Note (Signed)
Would continue her current dose of Lipitor.

## 2010-11-08 NOTE — Progress Notes (Signed)
Patient ID: Angela Howe, female    DOB: March 08, 1926, 77 y.o.   MRN: [id number]  HPI Comments: Angela Howe is a very pleasant 77 year old woman with history of coronary artery disease, status post bypass grafting in 2008, hypertension, hyperlipidemia, breast cancer status post left lumpectomy, and gastroesophageal reflux disease. Returns for routine f/u.   Admission to the hospital on June 10 2009 for diaphoresis, nausea or, dizziness. cardiac catheterization showed patent LIMA graft, vein graft to the diagonal, occluded vein graft to the PDA though the native RCA was patent, good LV function.   She was started on Florinef as an outpatient 0.1 mg 3 times a week. She reports that her symptoms have mildly improved. She still has a very restricted lifestyle and is unable to go shopping secondary to nausea and dizziness. She believes it is from low blood pressure. No ab pain, diarrhea. No night sweats, fevers, chills or weight loss.     Old EKG shows normal sinus rhythm with rate 91 beats per minute, no significant ST or T wave changes     Outpatient Encounter Prescriptions as of 11/08/2010  Medication Sig Dispense Refill  . acetaminophen (TYLENOL) 650 MG CR tablet Take 650 mg by mouth every 8 (eight) hours as needed.        Marland Kitchen amitriptyline (ELAVIL) 25 MG tablet Take 25 mg by mouth at bedtime.        Marland Kitchen aspirin 81 MG EC tablet Take 81 mg by mouth daily.        Marland Kitchen atorvastatin (LIPITOR) 40 MG tablet Take 40 mg by mouth daily.        . Calcium-Vitamin D 600-200 MG-UNIT per tablet Take 1 tablet by mouth 2 (two) times daily.        . citalopram (CELEXA) 20 MG tablet Take 20 mg by mouth daily.        . lansoprazole (PREVACID) 30 MG capsule Take 30 mg by mouth daily.        Marland Kitchen levothyroxine (SYNTHROID, LEVOTHROID) 50 MCG tablet Take 50 mcg by mouth daily.        . Multiple Vitamins-Minerals (PRESERVISION/LUTEIN PO) Take by mouth daily.        . fludrocortisone (FLORINEF) 0.1 MG tablet Take 1 tablet (0.1  mg total) by mouth daily three times a week  90 tablet  4     Review of Systems  Constitutional: Negative.   HENT: Negative.   Eyes: Negative.   Respiratory: Negative.   Cardiovascular: Negative.   Gastrointestinal: Positive for nausea.  Musculoskeletal: Negative.   Skin: Negative.   Neurological: Positive for dizziness.  Hematological: Negative.   Psychiatric/Behavioral: Negative.   All other systems reviewed and are negative.    BP 112/74  Pulse 109  Ht 5\' 4"  (1.626 m)  Wt 137 lb (62.143 kg)  BMI 23.52 kg/m2  Physical Exam  Nursing note and vitals reviewed. Constitutional: She is oriented to person, place, and time. She appears well-developed and well-nourished.  HENT:  Head: Normocephalic.  Nose: Nose normal.  Mouth/Throat: Oropharynx is clear and moist.  Eyes: Conjunctivae are normal. Pupils are equal, round, and reactive to light.  Neck: Normal range of motion. Neck supple. No JVD present.  Cardiovascular: Normal rate, regular rhythm, S1 normal, S2 normal, normal heart sounds and intact distal pulses.  Exam reveals no gallop and no friction rub.   No murmur heard. Pulmonary/Chest: Effort normal and breath sounds normal. No respiratory distress. She has no wheezes. She has no  rales. She exhibits no tenderness.  Abdominal: Soft. Bowel sounds are normal. She exhibits no distension. There is no tenderness.  Musculoskeletal: Normal range of motion. She exhibits no edema and no tenderness.  Lymphadenopathy:    She has no cervical adenopathy.  Neurological: She is alert and oriented to person, place, and time. Coordination normal.  Skin: Skin is warm and dry. No rash noted. No erythema.  Psychiatric: She has a normal mood and affect. Her behavior is normal. Judgment and thought content normal.         Assessment and Plan

## 2010-11-08 NOTE — Patient Instructions (Signed)
You are doing well. Please increase the florinef to one pill a day Please call us if you have new issues that need to be addressed before your next appt.  We will call you for a follow up Appt. In 3 months

## 2010-12-09 ENCOUNTER — Emergency Department: Payer: Medicare Other | Admitting: Unknown Physician Specialty

## 2010-12-15 ENCOUNTER — Inpatient Hospital Stay: Payer: Medicare Other | Admitting: Specialist

## 2011-02-07 ENCOUNTER — Ambulatory Visit (INDEPENDENT_AMBULATORY_CARE_PROVIDER_SITE_OTHER): Payer: Medicare Other | Admitting: Cardiovascular Disease

## 2011-02-07 ENCOUNTER — Encounter: Payer: Self-pay | Admitting: Cardiovascular Disease

## 2011-02-07 DIAGNOSIS — E785 Hyperlipidemia, unspecified: Secondary | ICD-10-CM

## 2011-02-07 DIAGNOSIS — I2510 Atherosclerotic heart disease of native coronary artery without angina pectoris: Secondary | ICD-10-CM

## 2011-02-07 DIAGNOSIS — I951 Orthostatic hypotension: Secondary | ICD-10-CM

## 2011-02-07 DIAGNOSIS — R42 Dizziness and giddiness: Secondary | ICD-10-CM

## 2011-02-07 NOTE — Patient Instructions (Signed)
You are doing well. No medication changes were made. Please call us if you have new issues that need to be addressed before your next appt.  We will call you for a follow up Appt. In 6 months  

## 2011-02-07 NOTE — Progress Notes (Signed)
Patient ID: [name], female    DOB: 1925-07-29, 77 y.o.   MRN: [id number]  HPI Comments: Angela Howe is a very pleasant 77 year old woman with history of coronary artery disease, status post bypass grafting in 2008. She also has a history of hypertension, hyperlipidemia, breast cancer status post left lumpectomy, and gastroesophageal reflux disease. Returns for routine f/u.   she had a recent admission to the hospital on June 10 2010  for diaphoresis, nausea or, dizziness.  cardiac catheterization showed patent LIMA graft, vein graft to the diagonal, occluded vein graft to the PDA though the native RCA was patent, good LV function.   Previously, she was having  intermittent nausea and vomitting. +hot flashes and sweats. Says she can be in store and get weak and then develop sweats with nausea and vomiting. Only happens when she goes. She was started on Florinef for blood pressure, currently takes 0.1 mg daily and has been feeling very well. She denies any hypotension, no symptoms of nausea and vomiting, no hot flashes or sweaty feelings. Overall she is very happy.    her Lipitor was decreased by Dr. [name] to see if her leg strength improved on a lower dose. Cholesterol in March was at goal. She believes this was on the higher dose Lipitor.  No ab pain, diarrhea. No night sweats, fevers, chills or weight loss.     EKG shows normal sinus rhythm with rate 91 beats per minute, no significant ST or T wave changes      Outpatient Encounter Prescriptions as of 02/07/2011  Medication Sig Dispense Refill  . acetaminophen (TYLENOL) 650 MG CR tablet Take 650 mg by mouth every 8 (eight) hours as needed.        Marland Kitchen amitriptyline (ELAVIL) 25 MG tablet Take 25 mg by mouth at bedtime.        Marland Kitchen aspirin 81 MG EC tablet Take 81 mg by mouth daily.        Marland Kitchen atorvastatin (LIPITOR) 40 MG tablet Take 40 mg by mouth daily.        . Calcium-Vitamin D 600-200 MG-UNIT per tablet Take 1 tablet by mouth 2 (two) times  daily.        . citalopram (CELEXA) 20 MG tablet Take 20 mg by mouth daily.        . fludrocortisone (FLORINEF) 0.1 MG tablet Take 1 tablet (0.1 mg total) by mouth daily.  90 tablet  4  . lansoprazole (PREVACID) 30 MG capsule Take 30 mg by mouth daily.        Marland Kitchen levothyroxine (SYNTHROID, LEVOTHROID) 50 MCG tablet Take 50 mcg by mouth daily.        . Multiple Vitamins-Minerals (PRESERVISION/LUTEIN PO) Take by mouth daily.           Review of Systems  Constitutional: Negative.   HENT: Negative.   Eyes: Negative.   Respiratory: Negative.   Cardiovascular: Negative.   Gastrointestinal: Negative.   Musculoskeletal: Negative.   Skin: Negative.   Neurological: Negative.   Hematological: Negative.   Psychiatric/Behavioral: Negative.   All other systems reviewed and are negative.    BP 132/75  Pulse 87  Ht 5\' 4"  (1.626 m)  Wt 141 lb 8 oz (64.184 kg)  BMI 24.29 kg/m2  Physical Exam  Nursing note and vitals reviewed. Constitutional: She is oriented to person, place, and time. She appears well-developed and well-nourished.  HENT:  Head: Normocephalic.  Nose: Nose normal.  Mouth/Throat: Oropharynx is clear and moist.  Eyes: Conjunctivae are normal. Pupils are equal, round, and reactive to light.  Neck: Normal range of motion. Neck supple. No JVD present.  Cardiovascular: Normal rate, regular rhythm, S1 normal, S2 normal, normal heart sounds and intact distal pulses.  Exam reveals no gallop and no friction rub.   No murmur heard. Pulmonary/Chest: Effort normal and breath sounds normal. No respiratory distress. She has no wheezes. She has no rales. She exhibits no tenderness.  Abdominal: Soft. Bowel sounds are normal. She exhibits no distension. There is no tenderness.  Musculoskeletal: Normal range of motion. She exhibits no edema and no tenderness.  Lymphadenopathy:    She has no cervical adenopathy.  Neurological: She is alert and oriented to person, place, and time. Coordination  normal.  Skin: Skin is warm and dry. No rash noted. No erythema.  Psychiatric: She has a normal mood and affect. Her behavior is normal. Judgment and thought content normal.         Assessment and Plan

## 2011-02-07 NOTE — Assessment & Plan Note (Signed)
orthostasis better on Florinef.

## 2011-02-07 NOTE — Assessment & Plan Note (Signed)
Cholesterol was very well controlled earlier this year. Uncertain if this was on 20 mg or 40 mg. Cold total cholesterol less than 150, goal LDL less than 70.

## 2011-02-07 NOTE — Assessment & Plan Note (Signed)
Her dizziness has resolved on Florinef. We have suggested she stay on this medication for now as she is doing so well. She did ask about her TED hose. We have suggested she could try regular socks given that she has had a prolonged period with no symptoms.

## 2011-02-07 NOTE — Assessment & Plan Note (Signed)
Currently with no symptoms of angina. No further workup at this time. Continue current medication regimen. 

## 2011-03-29 ENCOUNTER — Ambulatory Visit: Payer: Medicare Other | Admitting: Internal Medicine

## 2011-05-03 DIAGNOSIS — R51 Headache: Secondary | ICD-10-CM | POA: Diagnosis not present

## 2011-05-03 DIAGNOSIS — M9981 Other biomechanical lesions of cervical region: Secondary | ICD-10-CM | POA: Diagnosis not present

## 2011-05-03 DIAGNOSIS — M5430 Sciatica, unspecified side: Secondary | ICD-10-CM | POA: Diagnosis not present

## 2011-05-03 DIAGNOSIS — M5137 Other intervertebral disc degeneration, lumbosacral region: Secondary | ICD-10-CM | POA: Diagnosis not present

## 2011-05-03 DIAGNOSIS — IMO0002 Reserved for concepts with insufficient information to code with codable children: Secondary | ICD-10-CM | POA: Diagnosis not present

## 2011-05-03 DIAGNOSIS — M999 Biomechanical lesion, unspecified: Secondary | ICD-10-CM | POA: Diagnosis not present

## 2011-05-12 DIAGNOSIS — M9981 Other biomechanical lesions of cervical region: Secondary | ICD-10-CM | POA: Diagnosis not present

## 2011-05-12 DIAGNOSIS — R51 Headache: Secondary | ICD-10-CM | POA: Diagnosis not present

## 2011-05-12 DIAGNOSIS — M999 Biomechanical lesion, unspecified: Secondary | ICD-10-CM | POA: Diagnosis not present

## 2011-05-12 DIAGNOSIS — M5137 Other intervertebral disc degeneration, lumbosacral region: Secondary | ICD-10-CM | POA: Diagnosis not present

## 2011-05-12 DIAGNOSIS — IMO0002 Reserved for concepts with insufficient information to code with codable children: Secondary | ICD-10-CM | POA: Diagnosis not present

## 2011-05-12 DIAGNOSIS — M5430 Sciatica, unspecified side: Secondary | ICD-10-CM | POA: Diagnosis not present

## 2011-05-25 DIAGNOSIS — R51 Headache: Secondary | ICD-10-CM | POA: Diagnosis not present

## 2011-05-25 DIAGNOSIS — M5430 Sciatica, unspecified side: Secondary | ICD-10-CM | POA: Diagnosis not present

## 2011-05-25 DIAGNOSIS — IMO0002 Reserved for concepts with insufficient information to code with codable children: Secondary | ICD-10-CM | POA: Diagnosis not present

## 2011-05-25 DIAGNOSIS — M5137 Other intervertebral disc degeneration, lumbosacral region: Secondary | ICD-10-CM | POA: Diagnosis not present

## 2011-05-25 DIAGNOSIS — M9981 Other biomechanical lesions of cervical region: Secondary | ICD-10-CM | POA: Diagnosis not present

## 2011-05-25 DIAGNOSIS — M999 Biomechanical lesion, unspecified: Secondary | ICD-10-CM | POA: Diagnosis not present

## 2011-06-02 DIAGNOSIS — R51 Headache: Secondary | ICD-10-CM | POA: Diagnosis not present

## 2011-06-02 DIAGNOSIS — M5137 Other intervertebral disc degeneration, lumbosacral region: Secondary | ICD-10-CM | POA: Diagnosis not present

## 2011-06-02 DIAGNOSIS — M9981 Other biomechanical lesions of cervical region: Secondary | ICD-10-CM | POA: Diagnosis not present

## 2011-06-02 DIAGNOSIS — IMO0002 Reserved for concepts with insufficient information to code with codable children: Secondary | ICD-10-CM | POA: Diagnosis not present

## 2011-06-02 DIAGNOSIS — M999 Biomechanical lesion, unspecified: Secondary | ICD-10-CM | POA: Diagnosis not present

## 2011-06-02 DIAGNOSIS — M5430 Sciatica, unspecified side: Secondary | ICD-10-CM | POA: Diagnosis not present

## 2011-06-07 DIAGNOSIS — M5430 Sciatica, unspecified side: Secondary | ICD-10-CM | POA: Diagnosis not present

## 2011-06-07 DIAGNOSIS — IMO0002 Reserved for concepts with insufficient information to code with codable children: Secondary | ICD-10-CM | POA: Diagnosis not present

## 2011-06-07 DIAGNOSIS — M5137 Other intervertebral disc degeneration, lumbosacral region: Secondary | ICD-10-CM | POA: Diagnosis not present

## 2011-06-07 DIAGNOSIS — M9981 Other biomechanical lesions of cervical region: Secondary | ICD-10-CM | POA: Diagnosis not present

## 2011-06-07 DIAGNOSIS — M999 Biomechanical lesion, unspecified: Secondary | ICD-10-CM | POA: Diagnosis not present

## 2011-06-07 DIAGNOSIS — R51 Headache: Secondary | ICD-10-CM | POA: Diagnosis not present

## 2011-06-12 DIAGNOSIS — I10 Essential (primary) hypertension: Secondary | ICD-10-CM | POA: Diagnosis not present

## 2011-06-12 DIAGNOSIS — Z79899 Other long term (current) drug therapy: Secondary | ICD-10-CM | POA: Diagnosis not present

## 2011-06-12 DIAGNOSIS — E7800 Pure hypercholesterolemia, unspecified: Secondary | ICD-10-CM | POA: Diagnosis not present

## 2011-06-14 DIAGNOSIS — R51 Headache: Secondary | ICD-10-CM | POA: Diagnosis not present

## 2011-06-14 DIAGNOSIS — C50919 Malignant neoplasm of unspecified site of unspecified female breast: Secondary | ICD-10-CM | POA: Diagnosis not present

## 2011-06-14 DIAGNOSIS — I959 Hypotension, unspecified: Secondary | ICD-10-CM | POA: Diagnosis not present

## 2011-06-14 DIAGNOSIS — I2510 Atherosclerotic heart disease of native coronary artery without angina pectoris: Secondary | ICD-10-CM | POA: Diagnosis not present

## 2011-06-23 DIAGNOSIS — M5430 Sciatica, unspecified side: Secondary | ICD-10-CM | POA: Diagnosis not present

## 2011-06-23 DIAGNOSIS — M999 Biomechanical lesion, unspecified: Secondary | ICD-10-CM | POA: Diagnosis not present

## 2011-06-23 DIAGNOSIS — M9981 Other biomechanical lesions of cervical region: Secondary | ICD-10-CM | POA: Diagnosis not present

## 2011-06-23 DIAGNOSIS — R51 Headache: Secondary | ICD-10-CM | POA: Diagnosis not present

## 2011-06-23 DIAGNOSIS — IMO0002 Reserved for concepts with insufficient information to code with codable children: Secondary | ICD-10-CM | POA: Diagnosis not present

## 2011-06-23 DIAGNOSIS — M5137 Other intervertebral disc degeneration, lumbosacral region: Secondary | ICD-10-CM | POA: Diagnosis not present

## 2011-07-14 DIAGNOSIS — M9981 Other biomechanical lesions of cervical region: Secondary | ICD-10-CM | POA: Diagnosis not present

## 2011-07-14 DIAGNOSIS — IMO0002 Reserved for concepts with insufficient information to code with codable children: Secondary | ICD-10-CM | POA: Diagnosis not present

## 2011-07-14 DIAGNOSIS — R51 Headache: Secondary | ICD-10-CM | POA: Diagnosis not present

## 2011-07-14 DIAGNOSIS — M5430 Sciatica, unspecified side: Secondary | ICD-10-CM | POA: Diagnosis not present

## 2011-07-14 DIAGNOSIS — M999 Biomechanical lesion, unspecified: Secondary | ICD-10-CM | POA: Diagnosis not present

## 2011-07-14 DIAGNOSIS — M5137 Other intervertebral disc degeneration, lumbosacral region: Secondary | ICD-10-CM | POA: Diagnosis not present

## 2011-07-17 DIAGNOSIS — R51 Headache: Secondary | ICD-10-CM | POA: Diagnosis not present

## 2011-07-17 DIAGNOSIS — M5137 Other intervertebral disc degeneration, lumbosacral region: Secondary | ICD-10-CM | POA: Diagnosis not present

## 2011-07-17 DIAGNOSIS — IMO0002 Reserved for concepts with insufficient information to code with codable children: Secondary | ICD-10-CM | POA: Diagnosis not present

## 2011-07-17 DIAGNOSIS — M999 Biomechanical lesion, unspecified: Secondary | ICD-10-CM | POA: Diagnosis not present

## 2011-07-17 DIAGNOSIS — M5430 Sciatica, unspecified side: Secondary | ICD-10-CM | POA: Diagnosis not present

## 2011-07-17 DIAGNOSIS — M9981 Other biomechanical lesions of cervical region: Secondary | ICD-10-CM | POA: Diagnosis not present

## 2011-07-19 DIAGNOSIS — R51 Headache: Secondary | ICD-10-CM | POA: Diagnosis not present

## 2011-07-19 DIAGNOSIS — M9981 Other biomechanical lesions of cervical region: Secondary | ICD-10-CM | POA: Diagnosis not present

## 2011-07-19 DIAGNOSIS — IMO0002 Reserved for concepts with insufficient information to code with codable children: Secondary | ICD-10-CM | POA: Diagnosis not present

## 2011-07-19 DIAGNOSIS — M999 Biomechanical lesion, unspecified: Secondary | ICD-10-CM | POA: Diagnosis not present

## 2011-07-19 DIAGNOSIS — M5430 Sciatica, unspecified side: Secondary | ICD-10-CM | POA: Diagnosis not present

## 2011-07-19 DIAGNOSIS — M5137 Other intervertebral disc degeneration, lumbosacral region: Secondary | ICD-10-CM | POA: Diagnosis not present

## 2011-07-21 DIAGNOSIS — R51 Headache: Secondary | ICD-10-CM | POA: Diagnosis not present

## 2011-07-21 DIAGNOSIS — M5430 Sciatica, unspecified side: Secondary | ICD-10-CM | POA: Diagnosis not present

## 2011-07-21 DIAGNOSIS — IMO0002 Reserved for concepts with insufficient information to code with codable children: Secondary | ICD-10-CM | POA: Diagnosis not present

## 2011-07-21 DIAGNOSIS — M5137 Other intervertebral disc degeneration, lumbosacral region: Secondary | ICD-10-CM | POA: Diagnosis not present

## 2011-07-21 DIAGNOSIS — M999 Biomechanical lesion, unspecified: Secondary | ICD-10-CM | POA: Diagnosis not present

## 2011-07-21 DIAGNOSIS — M9981 Other biomechanical lesions of cervical region: Secondary | ICD-10-CM | POA: Diagnosis not present

## 2011-07-24 DIAGNOSIS — M5137 Other intervertebral disc degeneration, lumbosacral region: Secondary | ICD-10-CM | POA: Diagnosis not present

## 2011-07-24 DIAGNOSIS — IMO0002 Reserved for concepts with insufficient information to code with codable children: Secondary | ICD-10-CM | POA: Diagnosis not present

## 2011-07-24 DIAGNOSIS — M999 Biomechanical lesion, unspecified: Secondary | ICD-10-CM | POA: Diagnosis not present

## 2011-07-24 DIAGNOSIS — M5430 Sciatica, unspecified side: Secondary | ICD-10-CM | POA: Diagnosis not present

## 2011-07-24 DIAGNOSIS — M9981 Other biomechanical lesions of cervical region: Secondary | ICD-10-CM | POA: Diagnosis not present

## 2011-07-24 DIAGNOSIS — R51 Headache: Secondary | ICD-10-CM | POA: Diagnosis not present

## 2011-07-26 DIAGNOSIS — M5430 Sciatica, unspecified side: Secondary | ICD-10-CM | POA: Diagnosis not present

## 2011-07-26 DIAGNOSIS — M5137 Other intervertebral disc degeneration, lumbosacral region: Secondary | ICD-10-CM | POA: Diagnosis not present

## 2011-07-26 DIAGNOSIS — IMO0002 Reserved for concepts with insufficient information to code with codable children: Secondary | ICD-10-CM | POA: Diagnosis not present

## 2011-07-26 DIAGNOSIS — E7800 Pure hypercholesterolemia, unspecified: Secondary | ICD-10-CM | POA: Diagnosis not present

## 2011-07-26 DIAGNOSIS — I959 Hypotension, unspecified: Secondary | ICD-10-CM | POA: Diagnosis not present

## 2011-07-26 DIAGNOSIS — M999 Biomechanical lesion, unspecified: Secondary | ICD-10-CM | POA: Diagnosis not present

## 2011-07-26 DIAGNOSIS — M9981 Other biomechanical lesions of cervical region: Secondary | ICD-10-CM | POA: Diagnosis not present

## 2011-07-26 DIAGNOSIS — I2510 Atherosclerotic heart disease of native coronary artery without angina pectoris: Secondary | ICD-10-CM | POA: Diagnosis not present

## 2011-07-26 DIAGNOSIS — R51 Headache: Secondary | ICD-10-CM | POA: Diagnosis not present

## 2011-07-26 DIAGNOSIS — I10 Essential (primary) hypertension: Secondary | ICD-10-CM | POA: Diagnosis not present

## 2011-08-02 DIAGNOSIS — M999 Biomechanical lesion, unspecified: Secondary | ICD-10-CM | POA: Diagnosis not present

## 2011-08-02 DIAGNOSIS — IMO0002 Reserved for concepts with insufficient information to code with codable children: Secondary | ICD-10-CM | POA: Diagnosis not present

## 2011-08-02 DIAGNOSIS — M9981 Other biomechanical lesions of cervical region: Secondary | ICD-10-CM | POA: Diagnosis not present

## 2011-08-02 DIAGNOSIS — M5430 Sciatica, unspecified side: Secondary | ICD-10-CM | POA: Diagnosis not present

## 2011-08-02 DIAGNOSIS — R51 Headache: Secondary | ICD-10-CM | POA: Diagnosis not present

## 2011-08-02 DIAGNOSIS — M5137 Other intervertebral disc degeneration, lumbosacral region: Secondary | ICD-10-CM | POA: Diagnosis not present

## 2011-08-10 ENCOUNTER — Encounter: Payer: Self-pay | Admitting: Cardiovascular Disease

## 2011-08-10 ENCOUNTER — Ambulatory Visit (INDEPENDENT_AMBULATORY_CARE_PROVIDER_SITE_OTHER): Payer: Medicare Other | Admitting: Cardiovascular Disease

## 2011-08-10 DIAGNOSIS — I2510 Atherosclerotic heart disease of native coronary artery without angina pectoris: Secondary | ICD-10-CM

## 2011-08-10 DIAGNOSIS — R079 Chest pain, unspecified: Secondary | ICD-10-CM | POA: Diagnosis not present

## 2011-08-10 DIAGNOSIS — E785 Hyperlipidemia, unspecified: Secondary | ICD-10-CM

## 2011-08-10 DIAGNOSIS — I10 Essential (primary) hypertension: Secondary | ICD-10-CM

## 2011-08-10 MED ORDER — NITROGLYCERIN 0.4 MG SL SUBL: 0.4000 mg | SUBLINGUAL_TABLET | SUBLINGUAL | Status: DC | PRN

## 2011-08-10 NOTE — Progress Notes (Signed)
Patient ID: Angela Howe, female    DOB: March 10, 1926, 78 y.o.   MRN: [id number]  HPI Comments: Ms. Roderick is a very pleasant 78 year old woman with history of coronary artery disease, status post bypass grafting in 2008. She also has a history of hypertension, hyperlipidemia, breast cancer status post left lumpectomy, and gastroesophageal reflux disease.  recent admission to the hospital on June 10 2010  for diaphoresis, nausea or, dizziness.  cardiac catheterization showed patent LIMA graft, vein graft to the diagonal, occluded vein graft to the PDA though the native RCA was patent, good LV function. She presents for routine followup.   She was previously started on Florinef for low blood pressure and this has resolved her symptoms of dizziness and hot flashes.  She reports having recent episode of chest pain last night while she was going to bed. It started at 10 PM, lasted for 10 minutes, in the center of her chest. No significant diaphoresis, sweating, radiation of her pain. Pain did radiate to her back. Intensity of pain was 9/10. She's not had any further episodes of pain since then. She has been active today and feels well. Over the past several months, she has been doing well with no complaints.   No abd pain, diarrhea. No night sweats, fevers, chills or weight loss.     EKG shows normal sinus rhythm with rate 81 beats per minute, no significant ST or T wave changes      Outpatient Encounter Prescriptions as of 08/10/2011  Medication Sig Dispense Refill  . acetaminophen (TYLENOL) 650 MG CR tablet Take 650 mg by mouth every 8 (eight) hours as needed.        Marland Kitchen amitriptyline (ELAVIL) 25 MG tablet Take 25 mg by mouth at bedtime.        Marland Kitchen aspirin 81 MG EC tablet Take 81 mg by mouth daily.        Marland Kitchen atorvastatin (LIPITOR) 40 MG tablet Take 40 mg by mouth daily.        . Calcium-Vitamin D 600-200 MG-UNIT per tablet Take 1 tablet by mouth 2 (two) times daily.        . citalopram (CELEXA) 20  MG tablet Take 20 mg by mouth daily.        . fludrocortisone (FLORINEF) 0.1 MG tablet Take 1 tablet (0.1 mg total) by mouth daily.  90 tablet  4  . lansoprazole (PREVACID) 30 MG capsule Take 30 mg by mouth daily.        Marland Kitchen levothyroxine (SYNTHROID, LEVOTHROID) 50 MCG tablet Take 50 mcg by mouth daily.        . Multiple Vitamins-Minerals (PRESERVISION/LUTEIN PO) Take by mouth daily.         Review of Systems  Constitutional: Negative.   HENT: Negative.   Eyes: Negative.   Respiratory: Negative.   Cardiovascular: Positive for chest pain.  Gastrointestinal: Negative.   Musculoskeletal: Negative.   Skin: Negative.   Neurological: Negative.   Hematological: Negative.   Psychiatric/Behavioral: Negative.   All other systems reviewed and are negative.    BP 112/60  Pulse 81  Ht 5\' 4"  (1.626 m)  Wt 139 lb 4 oz (63.163 kg)  BMI 23.90 kg/m2  Physical Exam  Nursing note and vitals reviewed. Constitutional: She is oriented to person, place, and time. She appears well-developed and well-nourished.  HENT:  Head: Normocephalic.  Nose: Nose normal.  Mouth/Throat: Oropharynx is clear and moist.  Eyes: Conjunctivae are normal. Pupils are equal, round, and  reactive to light.  Neck: Normal range of motion. Neck supple. No JVD present.  Cardiovascular: Normal rate, regular rhythm, S1 normal, S2 normal and intact distal pulses.  Exam reveals no gallop and no friction rub.   Murmur heard. Pulmonary/Chest: Effort normal and breath sounds normal. No respiratory distress. She has no wheezes. She has no rales. She exhibits no tenderness.  Abdominal: Soft. Bowel sounds are normal. She exhibits no distension. There is no tenderness.  Musculoskeletal: Normal range of motion. She exhibits no edema and no tenderness.  Lymphadenopathy:    She has no cervical adenopathy.  Neurological: She is alert and oriented to person, place, and time. Coordination normal.  Skin: Skin is warm and dry. No rash noted. No  erythema.  Psychiatric: She has a normal mood and affect. Her behavior is normal. Judgment and thought content normal.         Assessment and Plan

## 2011-08-10 NOTE — Assessment & Plan Note (Signed)
Goal total cholesterol less than 150, LDL less than 70 

## 2011-08-10 NOTE — Patient Instructions (Signed)
You are doing well. No medication changes were made.  Please call us if you have new issues that need to be addressed before your next appt.  Your physician wants you to follow-up in: 6 months.  You will receive a reminder letter in the mail two months in advance. If you don't receive a letter, please call our office to schedule the follow-up appointment.   

## 2011-08-10 NOTE — Assessment & Plan Note (Signed)
Chest pain last night with no residual symptoms that resolved on its own. We have suggested if she has any additional pain that she call our office for further evaluation. She is not particularly eager to have a stress test. This was offered. Last catheterization was one year ago showing patent grafts with open negative RCA.   We have given her a prescription for nitroglycerin to take for chest pain.

## 2011-08-10 NOTE — Assessment & Plan Note (Signed)
Blood pressure is well controlled on today's visit. No changes made to the medications. 

## 2011-08-11 DIAGNOSIS — IMO0002 Reserved for concepts with insufficient information to code with codable children: Secondary | ICD-10-CM | POA: Diagnosis not present

## 2011-08-11 DIAGNOSIS — R51 Headache: Secondary | ICD-10-CM | POA: Diagnosis not present

## 2011-08-11 DIAGNOSIS — M5430 Sciatica, unspecified side: Secondary | ICD-10-CM | POA: Diagnosis not present

## 2011-08-11 DIAGNOSIS — M5137 Other intervertebral disc degeneration, lumbosacral region: Secondary | ICD-10-CM | POA: Diagnosis not present

## 2011-08-11 DIAGNOSIS — M9981 Other biomechanical lesions of cervical region: Secondary | ICD-10-CM | POA: Diagnosis not present

## 2011-08-11 DIAGNOSIS — M999 Biomechanical lesion, unspecified: Secondary | ICD-10-CM | POA: Diagnosis not present

## 2011-08-23 DIAGNOSIS — IMO0002 Reserved for concepts with insufficient information to code with codable children: Secondary | ICD-10-CM | POA: Diagnosis not present

## 2011-08-23 DIAGNOSIS — R51 Headache: Secondary | ICD-10-CM | POA: Diagnosis not present

## 2011-08-23 DIAGNOSIS — M9981 Other biomechanical lesions of cervical region: Secondary | ICD-10-CM | POA: Diagnosis not present

## 2011-08-23 DIAGNOSIS — M5137 Other intervertebral disc degeneration, lumbosacral region: Secondary | ICD-10-CM | POA: Diagnosis not present

## 2011-08-23 DIAGNOSIS — M999 Biomechanical lesion, unspecified: Secondary | ICD-10-CM | POA: Diagnosis not present

## 2011-08-23 DIAGNOSIS — M5430 Sciatica, unspecified side: Secondary | ICD-10-CM | POA: Diagnosis not present

## 2011-09-06 DIAGNOSIS — R51 Headache: Secondary | ICD-10-CM | POA: Diagnosis not present

## 2011-09-06 DIAGNOSIS — M999 Biomechanical lesion, unspecified: Secondary | ICD-10-CM | POA: Diagnosis not present

## 2011-09-06 DIAGNOSIS — M5430 Sciatica, unspecified side: Secondary | ICD-10-CM | POA: Diagnosis not present

## 2011-09-06 DIAGNOSIS — M9981 Other biomechanical lesions of cervical region: Secondary | ICD-10-CM | POA: Diagnosis not present

## 2011-09-06 DIAGNOSIS — M5137 Other intervertebral disc degeneration, lumbosacral region: Secondary | ICD-10-CM | POA: Diagnosis not present

## 2011-09-06 DIAGNOSIS — IMO0002 Reserved for concepts with insufficient information to code with codable children: Secondary | ICD-10-CM | POA: Diagnosis not present

## 2011-09-11 DIAGNOSIS — Z85828 Personal history of other malignant neoplasm of skin: Secondary | ICD-10-CM | POA: Diagnosis not present

## 2011-09-11 DIAGNOSIS — L723 Sebaceous cyst: Secondary | ICD-10-CM | POA: Diagnosis not present

## 2011-09-29 DIAGNOSIS — M999 Biomechanical lesion, unspecified: Secondary | ICD-10-CM | POA: Diagnosis not present

## 2011-09-29 DIAGNOSIS — IMO0002 Reserved for concepts with insufficient information to code with codable children: Secondary | ICD-10-CM | POA: Diagnosis not present

## 2011-09-29 DIAGNOSIS — M5137 Other intervertebral disc degeneration, lumbosacral region: Secondary | ICD-10-CM | POA: Diagnosis not present

## 2011-09-29 DIAGNOSIS — M5430 Sciatica, unspecified side: Secondary | ICD-10-CM | POA: Diagnosis not present

## 2011-09-29 DIAGNOSIS — M9981 Other biomechanical lesions of cervical region: Secondary | ICD-10-CM | POA: Diagnosis not present

## 2011-09-29 DIAGNOSIS — R51 Headache: Secondary | ICD-10-CM | POA: Diagnosis not present

## 2011-10-18 DIAGNOSIS — M9981 Other biomechanical lesions of cervical region: Secondary | ICD-10-CM | POA: Diagnosis not present

## 2011-10-18 DIAGNOSIS — I10 Essential (primary) hypertension: Secondary | ICD-10-CM | POA: Diagnosis not present

## 2011-10-18 DIAGNOSIS — M5430 Sciatica, unspecified side: Secondary | ICD-10-CM | POA: Diagnosis not present

## 2011-10-18 DIAGNOSIS — Z79899 Other long term (current) drug therapy: Secondary | ICD-10-CM | POA: Diagnosis not present

## 2011-10-18 DIAGNOSIS — IMO0002 Reserved for concepts with insufficient information to code with codable children: Secondary | ICD-10-CM | POA: Diagnosis not present

## 2011-10-18 DIAGNOSIS — R51 Headache: Secondary | ICD-10-CM | POA: Diagnosis not present

## 2011-10-18 DIAGNOSIS — M5137 Other intervertebral disc degeneration, lumbosacral region: Secondary | ICD-10-CM | POA: Diagnosis not present

## 2011-10-18 DIAGNOSIS — E7800 Pure hypercholesterolemia, unspecified: Secondary | ICD-10-CM | POA: Diagnosis not present

## 2011-10-18 DIAGNOSIS — M999 Biomechanical lesion, unspecified: Secondary | ICD-10-CM | POA: Diagnosis not present

## 2011-10-25 DIAGNOSIS — R5383 Other fatigue: Secondary | ICD-10-CM | POA: Diagnosis not present

## 2011-10-25 DIAGNOSIS — E7800 Pure hypercholesterolemia, unspecified: Secondary | ICD-10-CM | POA: Diagnosis not present

## 2011-10-25 DIAGNOSIS — R5381 Other malaise: Secondary | ICD-10-CM | POA: Diagnosis not present

## 2011-10-25 DIAGNOSIS — M79609 Pain in unspecified limb: Secondary | ICD-10-CM | POA: Diagnosis not present

## 2011-10-25 DIAGNOSIS — Z79899 Other long term (current) drug therapy: Secondary | ICD-10-CM | POA: Diagnosis not present

## 2011-10-25 DIAGNOSIS — I2510 Atherosclerotic heart disease of native coronary artery without angina pectoris: Secondary | ICD-10-CM | POA: Diagnosis not present

## 2011-10-31 DIAGNOSIS — M169 Osteoarthritis of hip, unspecified: Secondary | ICD-10-CM | POA: Diagnosis not present

## 2011-10-31 DIAGNOSIS — M1710 Unilateral primary osteoarthritis, unspecified knee: Secondary | ICD-10-CM | POA: Diagnosis not present

## 2011-11-07 DIAGNOSIS — M169 Osteoarthritis of hip, unspecified: Secondary | ICD-10-CM | POA: Diagnosis not present

## 2011-11-07 DIAGNOSIS — M25559 Pain in unspecified hip: Secondary | ICD-10-CM | POA: Diagnosis not present

## 2011-11-17 DIAGNOSIS — IMO0002 Reserved for concepts with insufficient information to code with codable children: Secondary | ICD-10-CM | POA: Diagnosis not present

## 2011-11-17 DIAGNOSIS — M9981 Other biomechanical lesions of cervical region: Secondary | ICD-10-CM | POA: Diagnosis not present

## 2011-11-17 DIAGNOSIS — R51 Headache: Secondary | ICD-10-CM | POA: Diagnosis not present

## 2011-11-17 DIAGNOSIS — M5430 Sciatica, unspecified side: Secondary | ICD-10-CM | POA: Diagnosis not present

## 2011-11-17 DIAGNOSIS — M999 Biomechanical lesion, unspecified: Secondary | ICD-10-CM | POA: Diagnosis not present

## 2011-11-17 DIAGNOSIS — M5137 Other intervertebral disc degeneration, lumbosacral region: Secondary | ICD-10-CM | POA: Diagnosis not present

## 2011-11-30 DIAGNOSIS — J320 Chronic maxillary sinusitis: Secondary | ICD-10-CM | POA: Diagnosis not present

## 2011-11-30 DIAGNOSIS — J069 Acute upper respiratory infection, unspecified: Secondary | ICD-10-CM | POA: Diagnosis not present

## 2011-12-12 DIAGNOSIS — M1710 Unilateral primary osteoarthritis, unspecified knee: Secondary | ICD-10-CM | POA: Diagnosis not present

## 2011-12-14 ENCOUNTER — Ambulatory Visit: Payer: Self-pay | Admitting: Unknown Physician Specialty

## 2011-12-14 DIAGNOSIS — M169 Osteoarthritis of hip, unspecified: Secondary | ICD-10-CM | POA: Diagnosis not present

## 2011-12-14 DIAGNOSIS — M25559 Pain in unspecified hip: Secondary | ICD-10-CM | POA: Diagnosis not present

## 2011-12-14 DIAGNOSIS — M1710 Unilateral primary osteoarthritis, unspecified knee: Secondary | ICD-10-CM | POA: Diagnosis not present

## 2011-12-14 DIAGNOSIS — M25459 Effusion, unspecified hip: Secondary | ICD-10-CM | POA: Diagnosis not present

## 2011-12-22 DIAGNOSIS — R51 Headache: Secondary | ICD-10-CM | POA: Diagnosis not present

## 2011-12-22 DIAGNOSIS — M5430 Sciatica, unspecified side: Secondary | ICD-10-CM | POA: Diagnosis not present

## 2011-12-22 DIAGNOSIS — M999 Biomechanical lesion, unspecified: Secondary | ICD-10-CM | POA: Diagnosis not present

## 2011-12-22 DIAGNOSIS — M5137 Other intervertebral disc degeneration, lumbosacral region: Secondary | ICD-10-CM | POA: Diagnosis not present

## 2011-12-22 DIAGNOSIS — IMO0002 Reserved for concepts with insufficient information to code with codable children: Secondary | ICD-10-CM | POA: Diagnosis not present

## 2011-12-22 DIAGNOSIS — M9981 Other biomechanical lesions of cervical region: Secondary | ICD-10-CM | POA: Diagnosis not present

## 2011-12-25 ENCOUNTER — Telehealth: Payer: Self-pay | Admitting: Cardiovascular Disease

## 2011-12-25 DIAGNOSIS — R51 Headache: Secondary | ICD-10-CM | POA: Diagnosis not present

## 2011-12-25 DIAGNOSIS — M5137 Other intervertebral disc degeneration, lumbosacral region: Secondary | ICD-10-CM | POA: Diagnosis not present

## 2011-12-25 DIAGNOSIS — M5430 Sciatica, unspecified side: Secondary | ICD-10-CM | POA: Diagnosis not present

## 2011-12-25 DIAGNOSIS — M9981 Other biomechanical lesions of cervical region: Secondary | ICD-10-CM | POA: Diagnosis not present

## 2011-12-25 DIAGNOSIS — IMO0002 Reserved for concepts with insufficient information to code with codable children: Secondary | ICD-10-CM | POA: Diagnosis not present

## 2011-12-25 DIAGNOSIS — M999 Biomechanical lesion, unspecified: Secondary | ICD-10-CM | POA: Diagnosis not present

## 2011-12-25 NOTE — Telephone Encounter (Signed)
Pt c/o episode of chest pain that woke her out of her sleep Thursday night.  She says it was CP that radiated through to her back and made it difficult for her to breathe.  She took SL NTG x3 w/o relief.  She took a 4th and finally got relief.  She did not go to ER/call 911. She states, "this scared me".  She has hx CABG 2009. Dr. Mariah Milling mentions repeat stress testing if she continues to have chest pain.  She denies active CP or SOB at this time.  She would like sooner appt than October appt scheduled with Dr. Mariah Milling.  I explained Dr. Mariah Milling on vacation this week but offered to work her in with another MD.  She is ok with this and is scheduled with Dr. Kirke Corin tomm at 1115. I advised her to go to ER should she have any further CP prior to appt. She verb. Understanding and confirms she has enough NTG tablets to last her.

## 2011-12-25 NOTE — Telephone Encounter (Signed)
Pt calling states that she has had several episode of irregular heartbeat and palps.

## 2011-12-26 ENCOUNTER — Encounter: Payer: Self-pay | Admitting: Cardiovascular Disease

## 2011-12-26 ENCOUNTER — Ambulatory Visit (INDEPENDENT_AMBULATORY_CARE_PROVIDER_SITE_OTHER): Payer: Medicare Other | Admitting: Cardiovascular Disease

## 2011-12-26 VITALS — BP 112/70 | HR 93 | Ht 65.0 in | Wt 138.0 lb

## 2011-12-26 DIAGNOSIS — R079 Chest pain, unspecified: Secondary | ICD-10-CM | POA: Diagnosis not present

## 2011-12-26 DIAGNOSIS — I2510 Atherosclerotic heart disease of native coronary artery without angina pectoris: Secondary | ICD-10-CM

## 2011-12-26 DIAGNOSIS — I10 Essential (primary) hypertension: Secondary | ICD-10-CM

## 2011-12-26 NOTE — Patient Instructions (Addendum)
Your physician has requested that you have a lexiscan myoview. For further information please visit [email or web address] Please follow instruction sheet, as given.  Follow up with Dr. Mariah Milling stress test.

## 2011-12-26 NOTE — Assessment & Plan Note (Signed)
The patient has known history of coronary artery disease with previous CABG. Her current symptoms are worrisome for nocturnal angina. Baseline ECG does not show significant ischemic changes. Unfortunately, given her low blood pressure and orthostatic hypotension, it's difficult to treat her with beta blocker or long-acting nitroglycerin. I will request a pharmacologic nuclear stress test for evaluation and to see if cardiac catheterization is needed. The patient is not able to exercise on a treadmill due to her chronic back pain.

## 2011-12-26 NOTE — Progress Notes (Signed)
HPI  Angela Howe is a very pleasant 78 year old woman with history of coronary artery disease, status post bypass grafting in 2008. She also has a history of hypertension, hyperlipidemia, breast cancer status post left lumpectomy, and gastroesophageal reflux disease.  She was hospitalized in June 10 2010 for diaphoresis, nausea or, dizziness. cardiac catheterization showed patent LIMA graft, vein graft to the diagonal, occluded vein graft to the PDA though the native RCA was patent, good LV function.  She presents for routine followup.  She has known history of mild hypotension and orthostatic hypotension which has been treated with Florinef. She reports increased symptoms of chest pain lately over the last month. She had 4 episodes of substernal chest tightness radiating to her back. All these episodes happen at night and woke her up from sleep. She took 4 nitroglycerin with resolution of symptoms. These symptoms are different from her GERD. She denies any exertional chest discomfort but she's not able to do much physical activities due to spinal stenosis.  Allergies  Allergen Reactions  . Alendronate Sodium      Current Outpatient Prescriptions on File Prior to Visit  Medication Sig Dispense Refill  . acetaminophen (TYLENOL) 650 MG CR tablet Take 650 mg by mouth every 8 (eight) hours as needed.        Marland Kitchen amitriptyline (ELAVIL) 25 MG tablet Take 25 mg by mouth at bedtime.        Marland Kitchen aspirin 81 MG EC tablet Take 81 mg by mouth daily.        Marland Kitchen atorvastatin (LIPITOR) 40 MG tablet Take 40 mg by mouth daily.        . Calcium-Vitamin D 600-200 MG-UNIT per tablet Take 1 tablet by mouth 2 (two) times daily.        . citalopram (CELEXA) 20 MG tablet Take 20 mg by mouth daily.        . lansoprazole (PREVACID) 30 MG capsule Take 30 mg by mouth daily.        Marland Kitchen levothyroxine (SYNTHROID, LEVOTHROID) 50 MCG tablet Take 50 mcg by mouth daily.        . Multiple Vitamins-Minerals (PRESERVISION/LUTEIN PO)  Take by mouth daily.        . nitroGLYCERIN (NITROSTAT) 0.4 MG SL tablet Place 1 tablet (0.4 mg total) under the tongue every 5 (five) minutes as needed for chest pain.  25 tablet  6     Past Medical History  Diagnosis Date  . CAD (coronary artery disease)   . Orthostatic hypotension   . Syncope   . Breast lump   . GERD (gastroesophageal reflux disease)   . Osteoarthritis   . Degenerative disc disease   . Hypothyroidism   . Hyperlipidemia     Previous intolerance of statins  . Peptic ulcer disease      Past Surgical History  Procedure Date  . Breast lumpectomy 2006  . Abdominal hysterectomy 1970  . Nasal sinus surgery   . Rotator cuff repair   . Appendectomy   . Coronary artery bypass graft 05/2006    x3     Family History  Problem Relation Age of Onset  . Heart attack Sister   . Pancreatic cancer Brother      History   Social History  . Marital Status: Married    Spouse Name: N/A    Number of Children: N/A  . Years of Education: N/A   Occupational History  . Retired    Social History Main Topics  .  Smoking status: Never Smoker   . Smokeless tobacco: Never Used  . Alcohol Use: No  . Drug Use: No  . Sexually Active:    Other Topics Concern  . Not on file   Social History Narrative   WidowedGets regular exercise      PHYSICAL EXAM   BP 112/70  Pulse 93  Ht 5\' 5"  (1.651 m)  Wt 138 lb (62.596 kg)  BMI 22.96 kg/m2 Constitutional: She is oriented to person, place, and time. She appears well-developed and well-nourished. No distress.  HENT: No nasal discharge.  Head: Normocephalic and atraumatic.  Eyes: Pupils are equal and round. Right eye exhibits no discharge. Left eye exhibits no discharge.  Neck: Normal range of motion. Neck supple. No JVD present. No thyromegaly present.  Cardiovascular: Normal rate, regular rhythm, normal heart sounds. Exam reveals no gallop and no friction rub. No murmur heard.  Pulmonary/Chest: Effort normal and  breath sounds normal. No stridor. No respiratory distress. She has no wheezes. She has no rales. She exhibits no tenderness.  Abdominal: Soft. Bowel sounds are normal. She exhibits no distension. There is no tenderness. There is no rebound and no guarding.  Musculoskeletal: Normal range of motion. She exhibits no edema and no tenderness.  Neurological: She is alert and oriented to person, place, and time. Coordination normal.  Skin: Skin is warm and dry. No rash noted. She is not diaphoretic. No erythema. No pallor.  Psychiatric: She has a normal mood and affect. Her behavior is normal. Judgment and thought content normal.     EKG: Sinus  Rhythm  -With rate variation  cv = 10. -  Nonspecific T-abnormality.   ABNORMAL    ASSESSMENT AND PLAN

## 2011-12-29 ENCOUNTER — Telehealth: Payer: Self-pay

## 2011-12-29 ENCOUNTER — Ambulatory Visit: Payer: Self-pay | Admitting: Cardiovascular Disease

## 2011-12-29 DIAGNOSIS — R079 Chest pain, unspecified: Secondary | ICD-10-CM | POA: Diagnosis not present

## 2011-12-29 DIAGNOSIS — R948 Abnormal results of function studies of other organs and systems: Secondary | ICD-10-CM | POA: Diagnosis not present

## 2011-12-29 NOTE — Telephone Encounter (Signed)
Message copied by Sabino Snipes E on Fri Dec 29, 2011  3:20 PM ------      Message from: Lorine Bears A      Created: Fri Dec 29, 2011  2:31 PM      Regarding: results       Inform her that stress test today was overall good with only slight abnormality. Keep appointment with Dr. Mariah Milling to see how she is doing.

## 2011-12-29 NOTE — Telephone Encounter (Signed)
Pt informed.  Understanding verb. Confirmed appt with Dr. Mariah Milling for 9/13. She deneis any further episodes of CP since last Thursday.  She has NTG and is aware of how to take this for CP.  She will call us should she need to be seen sooner.

## 2012-01-03 DIAGNOSIS — IMO0002 Reserved for concepts with insufficient information to code with codable children: Secondary | ICD-10-CM | POA: Diagnosis not present

## 2012-01-03 DIAGNOSIS — R51 Headache: Secondary | ICD-10-CM | POA: Diagnosis not present

## 2012-01-03 DIAGNOSIS — M999 Biomechanical lesion, unspecified: Secondary | ICD-10-CM | POA: Diagnosis not present

## 2012-01-03 DIAGNOSIS — M5430 Sciatica, unspecified side: Secondary | ICD-10-CM | POA: Diagnosis not present

## 2012-01-03 DIAGNOSIS — M9981 Other biomechanical lesions of cervical region: Secondary | ICD-10-CM | POA: Diagnosis not present

## 2012-01-03 DIAGNOSIS — M5137 Other intervertebral disc degeneration, lumbosacral region: Secondary | ICD-10-CM | POA: Diagnosis not present

## 2012-01-10 DIAGNOSIS — H353190 Nonexudative age-related macular degeneration, unspecified eye, stage unspecified: Secondary | ICD-10-CM | POA: Diagnosis not present

## 2012-01-10 DIAGNOSIS — H04129 Dry eye syndrome of unspecified lacrimal gland: Secondary | ICD-10-CM | POA: Diagnosis not present

## 2012-01-12 ENCOUNTER — Ambulatory Visit (INDEPENDENT_AMBULATORY_CARE_PROVIDER_SITE_OTHER): Payer: Medicare Other | Admitting: Cardiovascular Disease

## 2012-01-12 ENCOUNTER — Encounter: Payer: Self-pay | Admitting: Cardiovascular Disease

## 2012-01-12 VITALS — BP 130/68 | HR 83 | Ht 65.0 in | Wt 138.0 lb

## 2012-01-12 DIAGNOSIS — M542 Cervicalgia: Secondary | ICD-10-CM | POA: Diagnosis not present

## 2012-01-12 DIAGNOSIS — I2510 Atherosclerotic heart disease of native coronary artery without angina pectoris: Secondary | ICD-10-CM | POA: Diagnosis not present

## 2012-01-12 DIAGNOSIS — R079 Chest pain, unspecified: Secondary | ICD-10-CM | POA: Diagnosis not present

## 2012-01-12 DIAGNOSIS — E785 Hyperlipidemia, unspecified: Secondary | ICD-10-CM

## 2012-01-12 DIAGNOSIS — I951 Orthostatic hypotension: Secondary | ICD-10-CM

## 2012-01-12 MED ORDER — FLUDROCORTISONE ACETATE 0.1 MG PO TABS: 0.1000 mg | ORAL_TABLET | Freq: Every day | ORAL | Status: DC

## 2012-01-12 NOTE — Assessment & Plan Note (Signed)
Recent negative stress test. Breast attenuation artifact noted. Small apical defect.

## 2012-01-12 NOTE — Assessment & Plan Note (Signed)
We will restart her Florinef given symptoms of dizziness, diaphoresis on standing with previous pass out spells.

## 2012-01-12 NOTE — Patient Instructions (Addendum)
You are doing well. Please restart florinef one a day Monitor your blood pressure If the blood pressure runs high (top number consistently more the 140) take the florinef every other day  Please call us if you have new issues that need to be addressed before your next appt.  Your physician wants you to follow-up in: 6 months.  You will receive a reminder letter in the mail two months in advance. If you don't receive a letter, please call our office to schedule the follow-up appointment.

## 2012-01-12 NOTE — Assessment & Plan Note (Signed)
Cholesterol is at goal on the current lipid regimen. No changes to the medications were made.  

## 2012-01-12 NOTE — Progress Notes (Signed)
Patient ID: Angela Howe, female    DOB: Dec 09, 1925, 78 y.o.   MRN: [id number]  HPI Comments: Ms. Moncayo is a very pleasant 78 year old woman with history of coronary artery disease, status post bypass grafting in 2008. She also has a history of hypertension, hyperlipidemia, breast cancer status post left lumpectomy, and gastroesophageal reflux disease.  recent admission to the hospital on June 10 2010  for diaphoresis, nausea or, dizziness.  cardiac catheterization showed patent LIMA graft, vein graft to the diagonal, occluded vein graft to the PDA though the native RCA was patent, good LV function. She presents for routine followup. Several recent episodes of chest pain  She had chest pain at the end of August and presented to our office. Stress test was ordered to rule out ischemia. This showed anterior wall defect consistent with breast attenuation artifact, normal ejection fraction, unable to exclude mild apical ischemia  She has severe right hip pain and is interested in surgery. She is able to do light housework, groceries. She ran out of her Florinef 3 weeks ago and since then has had more lightheadedness and dizziness with diaphoresis. She feels she has labile blood pressur  and has had pass out spells in the past  No abd pain, diarrhea. No night sweats, fevers, chills or weight loss.     EKG shows normal sinus rhythm with rate 83 beats per minute, no significant ST or T wave changes      Outpatient Encounter Prescriptions as of 76/13/2013  Medication Sig Dispense Refill  . acetaminophen (TYLENOL) 650 MG CR tablet Take 650 mg by mouth every 8 (eight) hours as needed.        Marland Kitchen amitriptyline (ELAVIL) 25 MG tablet Take 25 mg by mouth at bedtime.        Marland Kitchen aspirin 81 MG EC tablet Take 81 mg by mouth daily.        Marland Kitchen atorvastatin (LIPITOR) 40 MG tablet Take 40 mg by mouth daily.        . Calcium-Vitamin D 600-200 MG-UNIT per tablet Take 1 tablet by mouth 2 (two) times daily.        .  citalopram (CELEXA) 20 MG tablet Take 20 mg by mouth daily.        . lansoprazole (PREVACID) 30 MG capsule Take 30 mg by mouth daily.        Marland Kitchen levothyroxine (SYNTHROID, LEVOTHROID) 50 MCG tablet Take 50 mcg by mouth daily.        . Multiple Vitamins-Minerals (PRESERVISION/LUTEIN PO) Take by mouth daily.        . nitroGLYCERIN (NITROSTAT) 0.4 MG SL tablet Place 1 tablet (0.4 mg total) under the tongue every 5 (five) minutes as needed for chest pain.  25 tablet  6  . fludrocortisone (FLORINEF) 0.1 MG tablet Take 1 tablet (0.1 mg total) by mouth daily.  30 tablet  6    Review of Systems  Constitutional: Negative.   HENT: Negative.   Eyes: Negative.   Respiratory: Negative.   Cardiovascular: Positive for chest pain.  Gastrointestinal: Negative.   Musculoskeletal: Negative.   Skin: Negative.   Neurological: Negative.   Hematological: Negative.   Psychiatric/Behavioral: Negative.   All other systems reviewed and are negative.    BP 130/68  Pulse 83  Ht 5\' 5"  (1.651 m)  Wt 138 lb (62.596 kg)  BMI 22.96 kg/m2  Physical Exam  Nursing note and vitals reviewed. Constitutional: She is oriented to person, place, and time. She appears  well-developed and well-nourished.  HENT:  Head: Normocephalic.  Nose: Nose normal.  Mouth/Throat: Oropharynx is clear and moist.  Eyes: Conjunctivae normal are normal. Pupils are equal, round, and reactive to light.  Neck: Normal range of motion. Neck supple. No JVD present.  Cardiovascular: Normal rate, regular rhythm, S1 normal, S2 normal and intact distal pulses.  Exam reveals no gallop and no friction rub.   Murmur heard. Pulmonary/Chest: Effort normal and breath sounds normal. No respiratory distress. She has no wheezes. She has no rales. She exhibits no tenderness.  Abdominal: Soft. Bowel sounds are normal. She exhibits no distension. There is no tenderness.  Musculoskeletal: Normal range of motion. She exhibits no edema and no tenderness.    Lymphadenopathy:    She has no cervical adenopathy.  Neurological: She is alert and oriented to person, place, and time. Coordination normal.  Skin: Skin is warm and dry. No rash noted. No erythema.  Psychiatric: She has a normal mood and affect. Her behavior is normal. Judgment and thought content normal.         Assessment and Plan

## 2012-01-17 DIAGNOSIS — R5381 Other malaise: Secondary | ICD-10-CM | POA: Diagnosis not present

## 2012-01-17 DIAGNOSIS — M5137 Other intervertebral disc degeneration, lumbosacral region: Secondary | ICD-10-CM | POA: Diagnosis not present

## 2012-01-17 DIAGNOSIS — R51 Headache: Secondary | ICD-10-CM | POA: Diagnosis not present

## 2012-01-17 DIAGNOSIS — M5430 Sciatica, unspecified side: Secondary | ICD-10-CM | POA: Diagnosis not present

## 2012-01-17 DIAGNOSIS — J029 Acute pharyngitis, unspecified: Secondary | ICD-10-CM | POA: Diagnosis not present

## 2012-01-17 DIAGNOSIS — IMO0002 Reserved for concepts with insufficient information to code with codable children: Secondary | ICD-10-CM | POA: Diagnosis not present

## 2012-01-17 DIAGNOSIS — M9981 Other biomechanical lesions of cervical region: Secondary | ICD-10-CM | POA: Diagnosis not present

## 2012-01-17 DIAGNOSIS — M999 Biomechanical lesion, unspecified: Secondary | ICD-10-CM | POA: Diagnosis not present

## 2012-01-17 DIAGNOSIS — N390 Urinary tract infection, site not specified: Secondary | ICD-10-CM | POA: Diagnosis not present

## 2012-01-23 ENCOUNTER — Other Ambulatory Visit: Payer: Self-pay | Admitting: Cardiovascular Disease

## 2012-01-23 DIAGNOSIS — R079 Chest pain, unspecified: Secondary | ICD-10-CM

## 2012-02-07 ENCOUNTER — Ambulatory Visit: Payer: Medicare Other | Admitting: Cardiovascular Disease

## 2012-02-07 DIAGNOSIS — IMO0002 Reserved for concepts with insufficient information to code with codable children: Secondary | ICD-10-CM | POA: Diagnosis not present

## 2012-02-07 DIAGNOSIS — R51 Headache: Secondary | ICD-10-CM | POA: Diagnosis not present

## 2012-02-07 DIAGNOSIS — M999 Biomechanical lesion, unspecified: Secondary | ICD-10-CM | POA: Diagnosis not present

## 2012-02-07 DIAGNOSIS — M5137 Other intervertebral disc degeneration, lumbosacral region: Secondary | ICD-10-CM | POA: Diagnosis not present

## 2012-02-07 DIAGNOSIS — M9981 Other biomechanical lesions of cervical region: Secondary | ICD-10-CM | POA: Diagnosis not present

## 2012-02-07 DIAGNOSIS — M5430 Sciatica, unspecified side: Secondary | ICD-10-CM | POA: Diagnosis not present

## 2012-02-08 DIAGNOSIS — D649 Anemia, unspecified: Secondary | ICD-10-CM | POA: Diagnosis not present

## 2012-02-08 DIAGNOSIS — I209 Angina pectoris, unspecified: Secondary | ICD-10-CM | POA: Diagnosis not present

## 2012-02-08 DIAGNOSIS — I2510 Atherosclerotic heart disease of native coronary artery without angina pectoris: Secondary | ICD-10-CM | POA: Diagnosis not present

## 2012-02-12 ENCOUNTER — Telehealth: Payer: Self-pay | Admitting: Internal Medicine

## 2012-02-12 NOTE — Telephone Encounter (Signed)
Spoke to pt.  She is coming at 2:00 tomorrow.  Please add her to my schedule.  Thanks.

## 2012-02-12 NOTE — Telephone Encounter (Signed)
Caller: [name]/Patient; Patient Name: Angela Howe; PCP: [facility]; Best Callback Phone Number: [phone]; Call regarding Itching under Left Arm, under bi-lat Breast and across Waist, onset 3 weeks.  All emergent symptoms ruled out per Rash protocol, see in 24 hours due to severe itching that interferes with daily activities.  No appts on 10-14 or 10-15.  Pt uses [facility], [place], [phone]. Please follow up with appt needs.

## 2012-02-13 ENCOUNTER — Ambulatory Visit (INDEPENDENT_AMBULATORY_CARE_PROVIDER_SITE_OTHER): Payer: Medicare Other | Admitting: Internal Medicine

## 2012-02-13 ENCOUNTER — Encounter: Payer: Self-pay | Admitting: Internal Medicine

## 2012-02-13 VITALS — BP 138/70 | HR 71 | Temp 98.9°F | Ht 64.0 in | Wt 139.0 lb

## 2012-02-13 DIAGNOSIS — I2510 Atherosclerotic heart disease of native coronary artery without angina pectoris: Secondary | ICD-10-CM

## 2012-02-13 DIAGNOSIS — Z23 Encounter for immunization: Secondary | ICD-10-CM

## 2012-02-13 DIAGNOSIS — E785 Hyperlipidemia, unspecified: Secondary | ICD-10-CM

## 2012-02-13 DIAGNOSIS — I10 Essential (primary) hypertension: Secondary | ICD-10-CM

## 2012-02-13 DIAGNOSIS — I951 Orthostatic hypotension: Secondary | ICD-10-CM

## 2012-02-13 MED ORDER — NYSTATIN 100000 UNIT/GM EX POWD: Freq: Two times a day (BID) | CUTANEOUS | Status: DC

## 2012-02-13 MED ORDER — NYSTATIN 100000 UNIT/GM EX CREA: TOPICAL_CREAM | Freq: Two times a day (BID) | CUTANEOUS | Status: DC

## 2012-02-13 MED ORDER — AMITRIPTYLINE HCL 50 MG PO TABS: 50.0000 mg | ORAL_TABLET | Freq: Every day | ORAL | Status: DC

## 2012-02-13 NOTE — Telephone Encounter (Signed)
Appointment made

## 2012-02-13 NOTE — Patient Instructions (Signed)
It was nice seeing you today.  I am going to prescribe some cream and powder - to apply to the affected areas 2x/day. Let me know if any problems.

## 2012-02-15 ENCOUNTER — Encounter: Payer: Self-pay | Admitting: Internal Medicine

## 2012-02-15 MED ORDER — ATORVASTATIN CALCIUM 20 MG PO TABS: 20.0000 mg | ORAL_TABLET | Freq: Every day | ORAL | Status: DC

## 2012-02-15 NOTE — Assessment & Plan Note (Signed)
Stable on Florinef. Doing better.  Follow.

## 2012-02-15 NOTE — Assessment & Plan Note (Signed)
Blood pressure controlled.  Follow.    

## 2012-02-15 NOTE — Assessment & Plan Note (Signed)
Currently stable.  Continue risk factor modification.

## 2012-02-15 NOTE — Assessment & Plan Note (Signed)
Remains on Lipitor.  On 20mg  now.  Follow.

## 2012-02-15 NOTE — Progress Notes (Signed)
Subjective:    Patient ID: Angela Howe, female    DOB: Oct 11, 1925, 78 y.o.   MRN: [id number]  HPI 78 year old female with past history of CAD s/p CABG followed by Dr Mariah Milling, hypercholesterolemia and orthostatic hypotension.  She comes in today as a work in with concerns regarding itching under her breast, left axilla and around her waist and groin.  She states she went to [facility] recently for a "mouth infection".  Had labs and a urine check and was told she had a urinary tract infection.  Was given antibiotics.  Had follow up last Thursday.  States follow up labs were "ok".  Having no urinary symptoms.  Her main complaint is itching - under her breast (left more), left axilla and around her waist (groin).  She has not noticed a significant rash.  Did change deodorants recently, but otherwise reports no changes or new contacts or exposures.  No fever.    She did have a spell where she was not sleeping well.  She increased her Amitriptyline to 50mg  q hs.  Is sleeping well now and not having any adverse affects from the increased Amitriptyline.  She would like to stay on the current dose of Amitriptyline.  She feels better now that she has been sleeping better.  Past Medical History  Diagnosis Date  . CAD (coronary artery disease)   . Orthostatic hypotension   . Syncope   . Breast cancer   . GERD (gastroesophageal reflux disease)   . Osteoarthritis   . Degenerative disc disease   . Hypothyroidism   . Hyperlipidemia     Previous intolerance of statins  . Peptic ulcer disease     Review of Systems Patient denies any headache, lightheadedness or dizziness.  No chest pain, tightness or palpatations. No increased shortness of breath, cough or congestion.  No nausea or vomiting.  No abdominal pain or cramping.  No bowel change, such as diarrhea, constipation, BRBPR or melana.  No urine change.        Objective:   Physical Exam Filed Vitals:   02/13/12 1409  BP: 138/70  Pulse: 71    Temp: 98.9 F (17.48 C)   78 year old female in no acute distress.   HEENT:  Nares - clear.  OP- without lesions or erythema.  NECK:  Supple, nontender.   HEART:  Appears to be regular. LUNGS:  Without crackles or wheezing audible.  Respirations even and unlabored.    ABDOMEN:  Soft, nontender.     EXTREMITIES:  No increased edema to be present.              SKIN:  Erythematous based rash under the left breast > right breast.  Faint rash under the left axilla and left inguinal area.  No rash noted around the waist.  (She did have a tight band - from her pants which could be contributing to the itching around her waist).      Assessment & Plan:  ITCHING.  Rash as noted.  Appears to be consistent with a yeast infection.  Will treat with Nystatin cream and Nystatin powder as directed.  Keep the area as dry as possible.  Follow.  Notify me if the itching and rash do not completely resolve.   DIFFICULTY SLEEPING.  See above.  She increased the Amitriptyline on her own.  Is sleeping better and feels better.  Wants to remain on this dose of Amitriptyline.  Discussed concern regarding increased sided  effects and risk of medication.  She is not having any adverse effects.  Will continue for now.  Discussed the need in future to start decreasing the dose.

## 2012-03-04 ENCOUNTER — Telehealth: Payer: Self-pay | Admitting: Internal Medicine

## 2012-03-04 MED ORDER — LEVOTHYROXINE SODIUM 50 MCG PO TABS: 50.0000 ug | ORAL_TABLET | Freq: Every day | ORAL | Status: DC

## 2012-03-04 MED ORDER — CITALOPRAM HYDROBROMIDE 20 MG PO TABS: 20.0000 mg | ORAL_TABLET | Freq: Every day | ORAL | Status: DC

## 2012-03-04 NOTE — Telephone Encounter (Signed)
See previous message.  Let me know which dermatologist she prefers to see and I will make the referral

## 2012-03-04 NOTE — Telephone Encounter (Signed)
Spoke with patient and she would be interested is seeing dermatology.

## 2012-03-04 NOTE — Telephone Encounter (Signed)
Pt is needing refills on Citalopram and Synthroid. [facility] uses [facility]. Pt is saying that her yeast infection is not getting any better. She was wondering what to do ???

## 2012-03-04 NOTE — Telephone Encounter (Signed)
I sent her prescriptions to [facility].  If the rash is no better with the meds I prescribed - I rec referral to dermatology for evaluation.  Let me know if she is agreeable to referral (and who she wants to see) and I will order the referral.

## 2012-03-05 DIAGNOSIS — L299 Pruritus, unspecified: Secondary | ICD-10-CM | POA: Diagnosis not present

## 2012-03-08 DIAGNOSIS — M5430 Sciatica, unspecified side: Secondary | ICD-10-CM | POA: Diagnosis not present

## 2012-03-08 DIAGNOSIS — M5137 Other intervertebral disc degeneration, lumbosacral region: Secondary | ICD-10-CM | POA: Diagnosis not present

## 2012-03-08 DIAGNOSIS — M999 Biomechanical lesion, unspecified: Secondary | ICD-10-CM | POA: Diagnosis not present

## 2012-03-08 DIAGNOSIS — IMO0002 Reserved for concepts with insufficient information to code with codable children: Secondary | ICD-10-CM | POA: Diagnosis not present

## 2012-03-08 DIAGNOSIS — R51 Headache: Secondary | ICD-10-CM | POA: Diagnosis not present

## 2012-03-08 DIAGNOSIS — M9981 Other biomechanical lesions of cervical region: Secondary | ICD-10-CM | POA: Diagnosis not present

## 2012-03-15 ENCOUNTER — Inpatient Hospital Stay: Payer: Self-pay | Admitting: Surgery

## 2012-03-15 DIAGNOSIS — G8918 Other acute postprocedural pain: Secondary | ICD-10-CM | POA: Diagnosis present

## 2012-03-15 DIAGNOSIS — R7989 Other specified abnormal findings of blood chemistry: Secondary | ICD-10-CM | POA: Diagnosis not present

## 2012-03-15 DIAGNOSIS — M5137 Other intervertebral disc degeneration, lumbosacral region: Secondary | ICD-10-CM | POA: Diagnosis not present

## 2012-03-15 DIAGNOSIS — F411 Generalized anxiety disorder: Secondary | ICD-10-CM | POA: Diagnosis present

## 2012-03-15 DIAGNOSIS — K8050 Calculus of bile duct without cholangitis or cholecystitis without obstruction: Secondary | ICD-10-CM | POA: Diagnosis not present

## 2012-03-15 DIAGNOSIS — M719 Bursopathy, unspecified: Secondary | ICD-10-CM | POA: Diagnosis present

## 2012-03-15 DIAGNOSIS — R079 Chest pain, unspecified: Secondary | ICD-10-CM | POA: Diagnosis not present

## 2012-03-15 DIAGNOSIS — E7800 Pure hypercholesterolemia, unspecified: Secondary | ICD-10-CM | POA: Diagnosis not present

## 2012-03-15 DIAGNOSIS — E876 Hypokalemia: Secondary | ICD-10-CM | POA: Diagnosis present

## 2012-03-15 DIAGNOSIS — M25519 Pain in unspecified shoulder: Secondary | ICD-10-CM | POA: Diagnosis not present

## 2012-03-15 DIAGNOSIS — R748 Abnormal levels of other serum enzymes: Secondary | ICD-10-CM | POA: Diagnosis not present

## 2012-03-15 DIAGNOSIS — Z923 Personal history of irradiation: Secondary | ICD-10-CM | POA: Diagnosis not present

## 2012-03-15 DIAGNOSIS — K8020 Calculus of gallbladder without cholecystitis without obstruction: Secondary | ICD-10-CM | POA: Diagnosis not present

## 2012-03-15 DIAGNOSIS — K8042 Calculus of bile duct with acute cholecystitis without obstruction: Secondary | ICD-10-CM | POA: Diagnosis present

## 2012-03-15 DIAGNOSIS — Z7982 Long term (current) use of aspirin: Secondary | ICD-10-CM | POA: Diagnosis not present

## 2012-03-15 DIAGNOSIS — K279 Peptic ulcer, site unspecified, unspecified as acute or chronic, without hemorrhage or perforation: Secondary | ICD-10-CM | POA: Diagnosis not present

## 2012-03-15 DIAGNOSIS — M1990 Unspecified osteoarthritis, unspecified site: Secondary | ICD-10-CM | POA: Diagnosis present

## 2012-03-15 DIAGNOSIS — Z418 Encounter for other procedures for purposes other than remedying health state: Secondary | ICD-10-CM | POA: Diagnosis not present

## 2012-03-15 DIAGNOSIS — M899 Disorder of bone, unspecified: Secondary | ICD-10-CM | POA: Diagnosis present

## 2012-03-15 DIAGNOSIS — E860 Dehydration: Secondary | ICD-10-CM | POA: Diagnosis not present

## 2012-03-15 DIAGNOSIS — Z9089 Acquired absence of other organs: Secondary | ICD-10-CM | POA: Diagnosis not present

## 2012-03-15 DIAGNOSIS — I951 Orthostatic hypotension: Secondary | ICD-10-CM | POA: Diagnosis present

## 2012-03-15 DIAGNOSIS — Z951 Presence of aortocoronary bypass graft: Secondary | ICD-10-CM | POA: Diagnosis not present

## 2012-03-15 DIAGNOSIS — D649 Anemia, unspecified: Secondary | ICD-10-CM | POA: Diagnosis not present

## 2012-03-15 DIAGNOSIS — E039 Hypothyroidism, unspecified: Secondary | ICD-10-CM | POA: Diagnosis present

## 2012-03-15 DIAGNOSIS — R1011 Right upper quadrant pain: Secondary | ICD-10-CM | POA: Diagnosis not present

## 2012-03-15 DIAGNOSIS — K819 Cholecystitis, unspecified: Secondary | ICD-10-CM | POA: Diagnosis not present

## 2012-03-15 DIAGNOSIS — R0789 Other chest pain: Secondary | ICD-10-CM | POA: Diagnosis not present

## 2012-03-15 DIAGNOSIS — R12 Heartburn: Secondary | ICD-10-CM | POA: Diagnosis not present

## 2012-03-15 DIAGNOSIS — Z888 Allergy status to other drugs, medicaments and biological substances status: Secondary | ICD-10-CM | POA: Diagnosis not present

## 2012-03-15 DIAGNOSIS — K828 Other specified diseases of gallbladder: Secondary | ICD-10-CM | POA: Diagnosis not present

## 2012-03-15 DIAGNOSIS — Z853 Personal history of malignant neoplasm of breast: Secondary | ICD-10-CM | POA: Diagnosis not present

## 2012-03-15 DIAGNOSIS — E785 Hyperlipidemia, unspecified: Secondary | ICD-10-CM | POA: Diagnosis not present

## 2012-03-15 DIAGNOSIS — Z90710 Acquired absence of both cervix and uterus: Secondary | ICD-10-CM | POA: Diagnosis not present

## 2012-03-15 DIAGNOSIS — K660 Peritoneal adhesions (postprocedural) (postinfection): Secondary | ICD-10-CM | POA: Diagnosis present

## 2012-03-15 DIAGNOSIS — Z5309 Procedure and treatment not carried out because of other contraindication: Secondary | ICD-10-CM | POA: Diagnosis not present

## 2012-03-15 DIAGNOSIS — K8010 Calculus of gallbladder with chronic cholecystitis without obstruction: Secondary | ICD-10-CM | POA: Diagnosis not present

## 2012-03-15 DIAGNOSIS — I2510 Atherosclerotic heart disease of native coronary artery without angina pectoris: Secondary | ICD-10-CM | POA: Diagnosis present

## 2012-03-15 LAB — COMPREHENSIVE METABOLIC PANEL
Albumin: 3 g/dL — ABNORMAL LOW (ref 3.4–5.0)
Alkaline Phosphatase: 163 U/L — ABNORMAL HIGH (ref 50–136)
Anion Gap: 6 — ABNORMAL LOW (ref 7–16)
BUN: 21 mg/dL — ABNORMAL HIGH (ref 7–18)
Creatinine: 0.88 mg/dL (ref 0.60–1.30)
EGFR (African American): >60
EGFR (Non-African Amer.): >60
Glucose: 96 mg/dL (ref 65–99)
Osmolality: 282 (ref 275–301)
Potassium: 3.6 mmol/L (ref 3.5–5.1)
Sodium: 140 mmol/L (ref 136–145)
Total Protein: 6.2 g/dL — ABNORMAL LOW (ref 6.4–8.2)

## 2012-03-15 LAB — CK TOTAL AND CKMB (NOT AT ARMC): CK-MB: 1.1 ng/mL (ref 0.5–3.6)

## 2012-03-15 LAB — CBC
HCT: 34 % — ABNORMAL LOW (ref 35.0–47.0)
HGB: 11.6 g/dL — ABNORMAL LOW (ref 12.0–16.0)
MCH: 31.1 pg (ref 26.0–34.0)
MCHC: 34.2 g/dL (ref 32.0–36.0)
MCV: 91 fL (ref 80–100)
Platelet: 211 10*3/uL (ref 150–440)
RBC: 3.74 10*6/uL — ABNORMAL LOW (ref 3.80–5.20)
RDW: 13.2 % (ref 11.5–14.5)

## 2012-03-15 LAB — TROPONIN I: Troponin-I: <0.02 ng/mL

## 2012-03-16 DIAGNOSIS — R079 Chest pain, unspecified: Secondary | ICD-10-CM

## 2012-03-16 LAB — BASIC METABOLIC PANEL
Anion Gap: 5 — ABNORMAL LOW (ref 7–16)
BUN: 24 mg/dL — ABNORMAL HIGH (ref 7–18)
Calcium, Total: 8.1 mg/dL — ABNORMAL LOW (ref 8.5–10.1)
EGFR (African American): 46 — ABNORMAL LOW
EGFR (Non-African Amer.): 40 — ABNORMAL LOW
Glucose: 101 mg/dL — ABNORMAL HIGH (ref 65–99)
Osmolality: 285 (ref 275–301)
Sodium: 141 mmol/L (ref 136–145)

## 2012-03-16 LAB — HEPATIC FUNCTION PANEL A (ARMC)
Alkaline Phosphatase: 245 U/L — ABNORMAL HIGH (ref 50–136)
Bilirubin,Total: 1.4 mg/dL — ABNORMAL HIGH (ref 0.2–1.0)
SGPT (ALT): 1493 U/L — ABNORMAL HIGH (ref 12–78)
Total Protein: 6.1 g/dL — ABNORMAL LOW (ref 6.4–8.2)

## 2012-03-16 LAB — TROPONIN I: Troponin-I: <0.02 ng/mL

## 2012-03-16 LAB — ACETAMINOPHEN LEVEL: Acetaminophen: 12 ug/mL

## 2012-03-16 LAB — APTT: Activated PTT: 37.9 secs — ABNORMAL HIGH (ref 23.6–35.9)

## 2012-03-16 LAB — PROTIME-INR: INR: 1.1

## 2012-03-17 LAB — CBC WITH DIFFERENTIAL/PLATELET
Basophil %: 0.1 %
Eosinophil #: 0.3 10*3/uL (ref 0.0–0.7)
HCT: 33.7 % — ABNORMAL LOW (ref 35.0–47.0)
Lymphocyte #: 1 10*3/uL (ref 1.0–3.6)
MCH: 31.8 pg (ref 26.0–34.0)
MCHC: 34.5 g/dL (ref 32.0–36.0)
MCV: 92 fL (ref 80–100)
Monocyte #: 0.4 x10 3/mm (ref 0.2–0.9)
Neutrophil #: 4.8 10*3/uL (ref 1.4–6.5)
Platelet: 166 10*3/uL (ref 150–440)
RDW: 13.8 % (ref 11.5–14.5)
WBC: 6.5 10*3/uL (ref 3.6–11.0)

## 2012-03-17 LAB — COMPREHENSIVE METABOLIC PANEL
Albumin: 2.8 g/dL — ABNORMAL LOW (ref 3.4–5.0)
Alkaline Phosphatase: 199 U/L — ABNORMAL HIGH (ref 50–136)
Anion Gap: 8 (ref 7–16)
BUN: 14 mg/dL (ref 7–18)
Bilirubin,Total: 1.1 mg/dL — ABNORMAL HIGH (ref 0.2–1.0)
Co2: 25 mmol/L (ref 21–32)
Creatinine: 1.01 mg/dL (ref 0.60–1.30)
Osmolality: 285 (ref 275–301)
Sodium: 143 mmol/L (ref 136–145)
Total Protein: 5.8 g/dL — ABNORMAL LOW (ref 6.4–8.2)

## 2012-03-18 LAB — COMPREHENSIVE METABOLIC PANEL
Albumin: 2.6 g/dL — ABNORMAL LOW (ref 3.4–5.0)
Alkaline Phosphatase: 203 U/L — ABNORMAL HIGH (ref 50–136)
Anion Gap: 8 (ref 7–16)
Calcium, Total: 7.9 mg/dL — ABNORMAL LOW (ref 8.5–10.1)
Chloride: 111 mmol/L — ABNORMAL HIGH (ref 98–107)
Co2: 23 mmol/L (ref 21–32)
Creatinine: 0.85 mg/dL (ref 0.60–1.30)
EGFR (Non-African Amer.): >60
Osmolality: 282 (ref 275–301)
SGOT(AST): 304 U/L — ABNORMAL HIGH (ref 15–37)
SGPT (ALT): 527 U/L — ABNORMAL HIGH (ref 12–78)
Sodium: 142 mmol/L (ref 136–145)

## 2012-03-18 LAB — MAGNESIUM: Magnesium: 1.8 mg/dL

## 2012-03-19 LAB — COMPREHENSIVE METABOLIC PANEL
Albumin: 2.7 g/dL — ABNORMAL LOW (ref 3.4–5.0)
BUN: 11 mg/dL (ref 7–18)
Bilirubin,Total: 0.8 mg/dL (ref 0.2–1.0)
Chloride: 113 mmol/L — ABNORMAL HIGH (ref 98–107)
Creatinine: 0.83 mg/dL (ref 0.60–1.30)
EGFR (African American): >60
EGFR (Non-African Amer.): >60
Glucose: 87 mg/dL (ref 65–99)
Potassium: 4.7 mmol/L (ref 3.5–5.1)
Sodium: 143 mmol/L (ref 136–145)
Total Protein: 5.9 g/dL — ABNORMAL LOW (ref 6.4–8.2)

## 2012-03-20 LAB — CBC WITH DIFFERENTIAL/PLATELET
Basophil #: 0 10*3/uL (ref 0.0–0.1)
Basophil %: 0.5 %
Eosinophil %: 2.1 %
HCT: 34.6 % — ABNORMAL LOW (ref 35.0–47.0)
HGB: 11.7 g/dL — ABNORMAL LOW (ref 12.0–16.0)
Lymphocyte #: 1.5 10*3/uL (ref 1.0–3.6)
MCV: 93 fL (ref 80–100)
Monocyte %: 7.1 %
Platelet: 178 10*3/uL (ref 150–440)
RBC: 3.74 10*6/uL — ABNORMAL LOW (ref 3.80–5.20)
RDW: 14.1 % (ref 11.5–14.5)
WBC: 10.3 10*3/uL (ref 3.6–11.0)

## 2012-03-20 LAB — COMPREHENSIVE METABOLIC PANEL
Alkaline Phosphatase: 290 U/L — ABNORMAL HIGH (ref 50–136)
BUN: 10 mg/dL (ref 7–18)
Chloride: 105 mmol/L (ref 98–107)
Co2: 24 mmol/L (ref 21–32)
Creatinine: 0.78 mg/dL (ref 0.60–1.30)
EGFR (African American): >60
EGFR (Non-African Amer.): >60
Glucose: 102 mg/dL — ABNORMAL HIGH (ref 65–99)
Potassium: 4 mmol/L (ref 3.5–5.1)
SGOT(AST): 128 U/L — ABNORMAL HIGH (ref 15–37)
SGPT (ALT): 344 U/L — ABNORMAL HIGH (ref 12–78)
Sodium: 136 mmol/L (ref 136–145)

## 2012-03-20 LAB — PATHOLOGY REPORT

## 2012-04-02 ENCOUNTER — Ambulatory Visit: Payer: Self-pay | Admitting: Surgery

## 2012-04-02 DIAGNOSIS — K8020 Calculus of gallbladder without cholecystitis without obstruction: Secondary | ICD-10-CM | POA: Diagnosis not present

## 2012-04-02 LAB — COMPREHENSIVE METABOLIC PANEL
Albumin: 3.4 g/dL (ref 3.4–5.0)
Anion Gap: 7 (ref 7–16)
Bilirubin,Total: 0.3 mg/dL (ref 0.2–1.0)
Calcium, Total: 8.9 mg/dL (ref 8.5–10.1)
Co2: 28 mmol/L (ref 21–32)
EGFR (African American): 52 — ABNORMAL LOW
Glucose: 97 mg/dL (ref 65–99)
Osmolality: 278 (ref 275–301)
Potassium: 4.9 mmol/L (ref 3.5–5.1)

## 2012-04-02 LAB — CBC WITH DIFFERENTIAL/PLATELET
Basophil %: 0.8 %
Eosinophil %: 2.2 %
HCT: 37.3 % (ref 35.0–47.0)
Lymphocyte #: 1.8 10*3/uL (ref 1.0–3.6)
MCH: 30.8 pg (ref 26.0–34.0)
MCV: 92 fL (ref 80–100)
Monocyte #: 0.5 x10 3/mm (ref 0.2–0.9)
Monocyte %: 6.9 %
Neutrophil %: 63.7 %
Platelet: 338 10*3/uL (ref 150–440)
WBC: 6.9 10*3/uL (ref 3.6–11.0)

## 2012-05-01 HISTORY — PX: BIOPSY BREAST: PRO8

## 2012-05-01 HISTORY — PX: BREAST EXCISIONAL BIOPSY: SUR124

## 2012-05-16 ENCOUNTER — Ambulatory Visit (INDEPENDENT_AMBULATORY_CARE_PROVIDER_SITE_OTHER): Payer: Medicare Other | Admitting: Internal Medicine

## 2012-05-16 ENCOUNTER — Encounter: Payer: Self-pay | Admitting: Internal Medicine

## 2012-05-16 VITALS — BP 110/58 | HR 105 | Temp 97.9°F | Ht 64.0 in | Wt 139.0 lb

## 2012-05-16 DIAGNOSIS — R5383 Other fatigue: Secondary | ICD-10-CM | POA: Diagnosis not present

## 2012-05-16 DIAGNOSIS — R42 Dizziness and giddiness: Secondary | ICD-10-CM | POA: Diagnosis not present

## 2012-05-16 DIAGNOSIS — I2510 Atherosclerotic heart disease of native coronary artery without angina pectoris: Secondary | ICD-10-CM

## 2012-05-16 DIAGNOSIS — R5381 Other malaise: Secondary | ICD-10-CM | POA: Diagnosis not present

## 2012-05-16 DIAGNOSIS — I951 Orthostatic hypotension: Secondary | ICD-10-CM

## 2012-05-16 DIAGNOSIS — R9431 Abnormal electrocardiogram [ECG] [EKG]: Secondary | ICD-10-CM

## 2012-05-16 DIAGNOSIS — I10 Essential (primary) hypertension: Secondary | ICD-10-CM

## 2012-05-16 DIAGNOSIS — E7800 Pure hypercholesterolemia, unspecified: Secondary | ICD-10-CM

## 2012-05-16 MED ORDER — FLUDROCORTISONE ACETATE 0.1 MG PO TABS: 0.1000 mg | ORAL_TABLET | Freq: Every day | ORAL | Status: DC

## 2012-05-19 ENCOUNTER — Encounter: Payer: Self-pay | Admitting: Internal Medicine

## 2012-05-19 NOTE — Assessment & Plan Note (Signed)
No chest pain or tightness.  Follow.

## 2012-05-19 NOTE — Progress Notes (Signed)
Subjective:    Patient ID: Angela Howe, female    DOB: 12/24/1925, 79 y.o.   MRN: [id number]  HPI 79 year old female with past history of CAD s/p CABG followed by Dr Mariah Milling, hypercholesterolemia and orthostatic hypotension.  She comes in today for a scheduled follow up.   She was in the hospital recently and had gall bladder surgery.  Has done well.  Had follow up 05/28/12.  She is having some loose stool after the surgery.  No abdominal pain or cramping.  No vomiting or diarrhea.  She is eating. States the florinef was stopped while she was in the hospital.  Has not restarted.  She has now been having issues again where she cannot stand or walk for long periods without having to sit down or feels as if she is going to pass out.  This was much better on the florinef.  No chest pain or tightness.  Breathing stable.  Some nasal congestion.  Started a few days ago.  No fever.  No significant sinus pressure.  No chest congestion.   Past Medical History  Diagnosis Date  . CAD (coronary artery disease)   . Orthostatic hypotension   . Syncope   . Breast cancer   . GERD (gastroesophageal reflux disease)   . Osteoarthritis   . Degenerative disc disease   . Hypothyroidism   . Hyperlipidemia     Previous intolerance of statins  . Peptic ulcer disease     Current Outpatient Prescriptions on File Prior to Visit  Medication Sig Dispense Refill  . acetaminophen (TYLENOL) 650 MG CR tablet Take 650 mg by mouth every 8 (eight) hours as needed.        Marland Kitchen amitriptyline (ELAVIL) 50 MG tablet Take 1 tablet (50 mg total) by mouth at bedtime.  90 tablet  1  . aspirin 81 MG EC tablet Take 81 mg by mouth daily.        Marland Kitchen atorvastatin (LIPITOR) 20 MG tablet Take 1 tablet (20 mg total) by mouth daily.  90 tablet  3  . Calcium-Vitamin D 600-200 MG-UNIT per tablet Take 1 tablet by mouth 2 (two) times daily.        . citalopram (CELEXA) 20 MG tablet Take 1 tablet (20 mg total) by mouth daily.  90 tablet  3  .  lansoprazole (PREVACID) 30 MG capsule Take 30 mg by mouth daily.        Marland Kitchen levothyroxine (SYNTHROID, LEVOTHROID) 50 MCG tablet Take 1 tablet (50 mcg total) by mouth daily.  90 tablet  3  . Multiple Vitamins-Minerals (PRESERVISION/LUTEIN PO) Take by mouth daily.        . nitroGLYCERIN (NITROSTAT) 0.4 MG SL tablet Place 1 tablet (0.4 mg total) under the tongue every 5 (five) minutes as needed for chest pain.  25 tablet  6  . nystatin (MYCOSTATIN) powder Apply topically 2 (two) times daily. Use for 7-10 days.  30 g  0  . nystatin cream (MYCOSTATIN) Apply topically 2 (two) times daily. Use for 7-10 days.  30 g  0    Review of Systems Patient denies any headache, lightheadedness or dizziness. Some nasal congestion as outlined.  No chest pain, tightness or palpatations. No increased shortness of breath, cough or congestion.  No nausea or vomiting.  No abdominal pain or cramping.  No bowel change, such as constipation, BRBPR or melana.  Some loose stool after the gall bladder surgery.  No urine change.  Problems with orthostasis again.  See above.  Feels she is handling stress well.      Objective:   Physical Exam  Filed Vitals:   05/16/12 1441  BP: 110/58  Pulse: 105  Temp: 97.9 F (36.6 C)   Blood pressure recheck:  122/68 lying and 90/58 standing.   79 year old female in no acute distress.   HEENT:  Nares - clear.  OP- without lesions or erythema.  NECK:  Supple, nontender.   HEART:  Appears to be regular. LUNGS:  Without crackles or wheezing audible.  Respirations even and unlabored.    ABDOMEN:  Soft, nontender.     EXTREMITIES:  No increased edema to be present.       Assessment & Plan:  ITCHING.  Not an issue for her today.  Follow.    LOOSE STOOL.  Is s/p gall bladder surgery.  Discussed with her today regarding - effects after gall bladder surgery.  Not a bad problem for her.  Just a change.  Will monitor.  Can try adding fiber.    PROBABLE ALLERGIES.  I do not feel abx are  warranted at this time.  Saline nasal spray.  Follow.  Notify me if worsens or if persistent.   HEALTH MAINTENANCE.  Obtain records from outside.  Keep on track with her routine physicals.

## 2012-05-19 NOTE — Assessment & Plan Note (Signed)
Symptoms and exam as outlined.  Add back Florinef.  Follow.  Get her back in soon to reassess.  Notify me if any problems.

## 2012-05-19 NOTE — Assessment & Plan Note (Signed)
Orthostatic on exam.   See below.  Add back Florinef.

## 2012-05-21 ENCOUNTER — Other Ambulatory Visit (INDEPENDENT_AMBULATORY_CARE_PROVIDER_SITE_OTHER): Payer: Medicare Other

## 2012-05-21 DIAGNOSIS — I10 Essential (primary) hypertension: Secondary | ICD-10-CM | POA: Diagnosis not present

## 2012-05-21 DIAGNOSIS — E7800 Pure hypercholesterolemia, unspecified: Secondary | ICD-10-CM

## 2012-05-21 DIAGNOSIS — R5383 Other fatigue: Secondary | ICD-10-CM | POA: Diagnosis not present

## 2012-05-21 DIAGNOSIS — R5381 Other malaise: Secondary | ICD-10-CM

## 2012-05-21 LAB — BASIC METABOLIC PANEL
BUN: 19 mg/dL (ref 6–23)
Chloride: 106 mEq/L (ref 96–112)
Creatinine, Ser: 0.8 mg/dL (ref 0.4–1.2)
Glucose, Bld: 91 mg/dL (ref 70–99)
Potassium: 4.3 mEq/L (ref 3.5–5.1)

## 2012-05-21 LAB — CBC WITH DIFFERENTIAL/PLATELET
Basophils Relative: 0.2 % (ref 0.0–3.0)
Eosinophils Relative: 3.2 % (ref 0.0–5.0)
Hemoglobin: 12.2 g/dL (ref 12.0–15.0)
Lymphocytes Relative: 34.8 % (ref 12.0–46.0)
MCHC: 34 g/dL (ref 30.0–36.0)
MCV: 89.4 fl (ref 78.0–100.0)
Neutro Abs: 3.1 10*3/uL (ref 1.4–7.7)
Neutrophils Relative %: 55.4 % (ref 43.0–77.0)
RBC: 4.02 Mil/uL (ref 3.87–5.11)
WBC: 5.6 10*3/uL (ref 4.5–10.5)

## 2012-05-21 LAB — HEPATIC FUNCTION PANEL
ALT: 26 U/L (ref 0–35)
AST: 31 U/L (ref 0–37)
Albumin: 3.4 g/dL — ABNORMAL LOW (ref 3.5–5.2)
Total Protein: 6.5 g/dL (ref 6.0–8.3)

## 2012-05-21 LAB — TSH: TSH: 0.42 u[IU]/mL (ref 0.35–5.50)

## 2012-05-21 LAB — LIPID PANEL
Cholesterol: 134 mg/dL (ref 0–200)
VLDL: 39.6 mg/dL (ref 0.0–40.0)

## 2012-07-08 ENCOUNTER — Ambulatory Visit: Payer: Medicare Other | Admitting: Internal Medicine

## 2012-07-16 ENCOUNTER — Encounter: Payer: Self-pay | Admitting: Cardiovascular Disease

## 2012-07-16 ENCOUNTER — Ambulatory Visit (INDEPENDENT_AMBULATORY_CARE_PROVIDER_SITE_OTHER): Payer: Medicare Other | Admitting: Cardiovascular Disease

## 2012-07-16 VITALS — BP 120/62 | HR 85 | Ht 64.0 in | Wt 135.8 lb

## 2012-07-16 DIAGNOSIS — I2510 Atherosclerotic heart disease of native coronary artery without angina pectoris: Secondary | ICD-10-CM | POA: Diagnosis not present

## 2012-07-16 DIAGNOSIS — I951 Orthostatic hypotension: Secondary | ICD-10-CM

## 2012-07-16 NOTE — Patient Instructions (Addendum)
You are doing well. No medication changes were made.  Please call us if you have new issues that need to be addressed before your next appt.  Your physician wants you to follow-up in: 6 months.  You will receive a reminder letter in the mail two months in advance. If you don't receive a letter, please call our office to schedule the follow-up appointment.   

## 2012-07-16 NOTE — Assessment & Plan Note (Signed)
We have encouraged her to stay on her Florinef. Symptoms have resolved

## 2012-07-16 NOTE — Assessment & Plan Note (Signed)
Cholesterol is at goal on the current lipid regimen. No changes to the medications were made.  

## 2012-07-16 NOTE — Progress Notes (Signed)
Patient ID: Angela Howe, female    DOB: 09-25-1925, 79 y.o.   MRN: [id number]  HPI Comments: Ms. Matzek is a very pleasant 79 year old woman with history of coronary artery disease, status post bypass grafting in 2008. She also has a history of hypertension, hyperlipidemia, breast cancer status post left lumpectomy, and gastroesophageal reflux disease.she is maintained on Florinef for orthostatic hypotension.   recent admission to the hospital on June 10 2010  for diaphoresis, nausea or, dizziness.  cardiac catheterization showed patent LIMA graft, vein graft to the diagonal, occluded vein graft to the PDA though the native RCA was patent, good LV function. She presents for routine followup.   She had chest pain at the end of August 2013 and presented to our office. Stress test was ordered to rule out ischemia. This showed anterior wall defect consistent with breast attenuation artifact, normal ejection fraction, unable to exclude mild apical ischemia  She has severe right hip pain and is not interested in surgery. Recent emergency laparoscopic cholecystectomy. Seemed to recover well. Discharged without her Florinef but noticed low blood pressure and having more symptoms of dizziness. Florinef was restarted and symptoms have resolved. She does report having occasional shortness of breath particularly when she walks to the mailbox and then walks back home.  No abd pain, diarrhea. No night sweats, fevers, chills or weight loss.   No recent chest pain   EKG shows normal sinus rhythm with rate 86 beats per minute, no significant ST or T wave changes      Outpatient Encounter Prescriptions as of 07/16/2012  Medication Sig Dispense Refill  . acetaminophen (TYLENOL) 650 MG CR tablet Take 650 mg by mouth every 8 (eight) hours as needed.        Marland Kitchen amitriptyline (ELAVIL) 50 MG tablet Take 1 tablet (50 mg total) by mouth at bedtime.  90 tablet  1  . aspirin 81 MG EC tablet Take 81 mg by mouth daily.         Marland Kitchen atorvastatin (LIPITOR) 20 MG tablet Take 1 tablet (20 mg total) by mouth daily.  90 tablet  3  . Calcium-Vitamin D 600-200 MG-UNIT per tablet Take 1 tablet by mouth 2 (two) times daily.        . citalopram (CELEXA) 20 MG tablet Take 1 tablet (20 mg total) by mouth daily.  90 tablet  3  . fludrocortisone (FLORINEF) 0.1 MG tablet Take 1 tablet (0.1 mg total) by mouth daily.  30 tablet  6  . lansoprazole (PREVACID) 30 MG capsule Take 30 mg by mouth daily.        Marland Kitchen levothyroxine (SYNTHROID, LEVOTHROID) 50 MCG tablet Take 1 tablet (50 mcg total) by mouth daily.  90 tablet  3  . Multiple Vitamins-Minerals (PRESERVISION/LUTEIN PO) Take by mouth daily.        . nitroGLYCERIN (NITROSTAT) 0.4 MG SL tablet Place 1 tablet (0.4 mg total) under the tongue every 5 (five) minutes as needed for chest pain.  25 tablet  6  . nystatin (MYCOSTATIN) powder Apply topically 2 (two) times daily. Use for 7-10 days.  30 g  0  . nystatin cream (MYCOSTATIN) Apply topically 2 (two) times daily. Use for 7-10 days.  30 g  0   No facility-administered encounter medications on file as of 07/16/2012.    Review of Systems  Constitutional: Negative.   HENT: Negative.   Eyes: Negative.   Respiratory: Negative.   Gastrointestinal: Negative.   Musculoskeletal: Positive for arthralgias  and gait problem.  Skin: Negative.   Neurological: Negative.   Psychiatric/Behavioral: Negative.   All other systems reviewed and are negative.    BP 120/62  Pulse 85  Ht 5\' 4"  (1.626 m)  Wt 135 lb 12 oz (61.576 kg)  BMI 23.29 kg/m2  Physical Exam  Nursing note and vitals reviewed. Constitutional: She is oriented to person, place, and time. She appears well-developed and well-nourished.  HENT:  Head: Normocephalic.  Nose: Nose normal.  Mouth/Throat: Oropharynx is clear and moist.  Eyes: Conjunctivae are normal. Pupils are equal, round, and reactive to light.  Neck: Normal range of motion. Neck supple. No JVD present.   Cardiovascular: Normal rate, regular rhythm, S1 normal, S2 normal and intact distal pulses.  Exam reveals no gallop and no friction rub.   Pulmonary/Chest: Effort normal and breath sounds normal. No respiratory distress. She has no wheezes. She has no rales. She exhibits no tenderness.  Abdominal: Soft. Bowel sounds are normal. She exhibits no distension. There is no tenderness.  Musculoskeletal: Normal range of motion. She exhibits no edema and no tenderness.  Lymphadenopathy:    She has no cervical adenopathy.  Neurological: She is alert and oriented to person, place, and time. Coordination normal.  Skin: Skin is warm and dry. No rash noted. No erythema.  Psychiatric: She has a normal mood and affect. Her behavior is normal. Judgment and thought content normal.    Assessment and Plan

## 2012-07-16 NOTE — Assessment & Plan Note (Signed)
Currently with no symptoms of angina. No further workup at this time. Continue current medication regimen. 

## 2012-08-09 ENCOUNTER — Encounter: Payer: Self-pay | Admitting: Internal Medicine

## 2012-08-09 ENCOUNTER — Ambulatory Visit (INDEPENDENT_AMBULATORY_CARE_PROVIDER_SITE_OTHER): Payer: Medicare Other | Admitting: Internal Medicine

## 2012-08-09 VITALS — BP 128/76 | HR 78 | Temp 98.2°F | Resp 18 | Wt 136.8 lb

## 2012-08-09 DIAGNOSIS — I10 Essential (primary) hypertension: Secondary | ICD-10-CM

## 2012-08-09 DIAGNOSIS — I951 Orthostatic hypotension: Secondary | ICD-10-CM

## 2012-08-09 DIAGNOSIS — E785 Hyperlipidemia, unspecified: Secondary | ICD-10-CM

## 2012-08-09 DIAGNOSIS — G479 Sleep disorder, unspecified: Secondary | ICD-10-CM

## 2012-08-09 DIAGNOSIS — I2510 Atherosclerotic heart disease of native coronary artery without angina pectoris: Secondary | ICD-10-CM | POA: Diagnosis not present

## 2012-08-09 DIAGNOSIS — E039 Hypothyroidism, unspecified: Secondary | ICD-10-CM

## 2012-08-09 DIAGNOSIS — C50919 Malignant neoplasm of unspecified site of unspecified female breast: Secondary | ICD-10-CM

## 2012-08-09 DIAGNOSIS — C50912 Malignant neoplasm of unspecified site of left female breast: Secondary | ICD-10-CM

## 2012-08-10 ENCOUNTER — Telehealth: Payer: Self-pay | Admitting: Internal Medicine

## 2012-08-10 ENCOUNTER — Encounter: Payer: Self-pay | Admitting: Internal Medicine

## 2012-08-10 DIAGNOSIS — G479 Sleep disorder, unspecified: Secondary | ICD-10-CM | POA: Insufficient documentation

## 2012-08-10 DIAGNOSIS — M25512 Pain in left shoulder: Secondary | ICD-10-CM

## 2012-08-10 DIAGNOSIS — E039 Hypothyroidism, unspecified: Secondary | ICD-10-CM | POA: Insufficient documentation

## 2012-08-10 MED ORDER — AMITRIPTYLINE HCL 50 MG PO TABS: 50.0000 mg | ORAL_TABLET | Freq: Every day | ORAL | Status: DC

## 2012-08-10 NOTE — Assessment & Plan Note (Signed)
Followed by Dr Gollan.  Currently doing well.  Continue risk factor modification.   

## 2012-08-10 NOTE — Assessment & Plan Note (Addendum)
Remains on Lipitor.  On 20mg .  Follow. Check lipid panel and liver function next labs.

## 2012-08-10 NOTE — Assessment & Plan Note (Signed)
Doing much better on the amitriptyline.  Tolerating without adverse effects.  Follow.

## 2012-08-10 NOTE — Assessment & Plan Note (Signed)
On thyroid replacement.  Check tsh with next labs.  

## 2012-08-10 NOTE — Progress Notes (Signed)
Subjective:    Patient ID: Angela Howe, female    DOB: [birth year], 79 y.o.   MRN: [id number]  HPI 79 year old female with past history of CAD s/p CABG followed by Dr Mariah Milling, hypercholesterolemia and orthostatic hypotension.  She comes in today for a scheduled follow up.   States she is doing relatively well.  Back on the Florinef and doing well.  Able to go to the store, etc.  Feels much better.  No chest pain or tightness.  No sob.  No nausea or vomiting.  Bowels stable.  Being on the amitriptyline 50mg  q hs - sleeping much better.  Feels better.  Not having adverse effects.  She is having some itching - left axilla.  No rash.  Had previously tried nystatin cream.  Did not help.  She is also having increased pain - left shoulder, upper arm and left axilla.  Limited rom.  Increased pain with adduction - especially above 90 degrees.  No injury.  Previously had neck issues.  No pain with rotation of her head.     Past Medical History  Diagnosis Date  . CAD (coronary artery disease)   . Orthostatic hypotension   . Syncope   . Breast cancer   . GERD (gastroesophageal reflux disease)   . Osteoarthritis   . Degenerative disc disease   . Hypothyroidism   . Hyperlipidemia     Previous intolerance of statins  . Peptic ulcer disease     Current Outpatient Prescriptions on File Prior to Visit  Medication Sig Dispense Refill  . acetaminophen (TYLENOL) 650 MG CR tablet Take 650 mg by mouth every 8 (eight) hours as needed.        Marland Kitchen aspirin 81 MG EC tablet Take 81 mg by mouth daily.        Marland Kitchen atorvastatin (LIPITOR) 20 MG tablet Take 1 tablet (20 mg total) by mouth daily.  90 tablet  3  . Calcium-Vitamin D 600-200 MG-UNIT per tablet Take 1 tablet by mouth 2 (two) times daily.        . citalopram (CELEXA) 20 MG tablet Take 1 tablet (20 mg total) by mouth daily.  90 tablet  3  . fludrocortisone (FLORINEF) 0.1 MG tablet Take 1 tablet (0.1 mg total) by mouth daily.  30 tablet  6  . lansoprazole (PREVACID)  30 MG capsule Take 30 mg by mouth daily.        Marland Kitchen levothyroxine (SYNTHROID, LEVOTHROID) 50 MCG tablet Take 1 tablet (50 mcg total) by mouth daily.  90 tablet  3  . Multiple Vitamins-Minerals (PRESERVISION/LUTEIN PO) Take by mouth daily.        . nitroGLYCERIN (NITROSTAT) 0.4 MG SL tablet Place 1 tablet (0.4 mg total) under the tongue every 5 (five) minutes as needed for chest pain.  25 tablet  6  . nystatin (MYCOSTATIN) powder Apply topically 2 (two) times daily. Use for 7-10 days.  30 g  0  . nystatin cream (MYCOSTATIN) Apply topically 2 (two) times daily. Use for 7-10 days.  30 g  0   No current facility-administered medications on file prior to visit.    Review of Systems Patient denies any headache, lightheadedness or dizziness.  No sinus congestion or drainage.  No chest pain, tightness or palpitations. No increased shortness of breath, cough or congestion.  No nausea or vomiting.  No acid reflux.  No abdominal pain or cramping.  No bowel change, such as constipation, BRBPR or melana.  No problems  with loose stool reported today.  No urine change.  Back on Florinef.  Feeling better.  Feels she is handling stress well.  Sleeping better.  See above.  Increased shoulder/arm pain as outlined.  Takes tylenol prn.      Objective:   Physical Exam  Filed Vitals:   08/09/12 1622  BP: 128/76  Pulse: 78  Temp: 98.2 F (36.8 C)  Resp: 62   79 year old female in no acute distress.   HEENT:  Nares - clear.  OP- without lesions or erythema.  NECK:  Supple, nontender.   HEART:  Appears to be regular. LUNGS:  Without crackles or wheezing audible.  Respirations even and unlabored.  BREAST:  Some tenderness to palpation - left breast.  Pt states has been present since surgery.    ABDOMEN:  Soft, nontender.     EXTREMITIES:  No increased edema to be present.     MSK:  No pain with rotation of her head.  Increased pain with abduction and adduction of her left arm.   SKIN:  No rash.   Assessment &  Plan:  ITCHING.  Unclear as to the exact etiology.  No rash.  Will treat msk concerns.  See if improves.  Follow.  Monitor for possible triggers.  No itching now.    MSK.  Shoulder and left upper arm pain as outlined.  No injury.  Symptoms and exam as outlined.  Tylenol and gentle use of ibuprofen as directed.  After discussion with her, will hold on xray.  Refer to Dr Gavin Potters for evaluation and question of need for possible injection.    HEALTH MAINTENANCE.  Need to keep on track with physicals.  Schedule a physical for next visit.  Schedule a mammogram.

## 2012-08-10 NOTE — Assessment & Plan Note (Signed)
Blood pressure doing better.  Follow.   

## 2012-08-10 NOTE — Assessment & Plan Note (Signed)
Much better on Florinef.  Follow.

## 2012-08-10 NOTE — Telephone Encounter (Signed)
Pt left without being able to schedule a follow up appt.  Please schedule a physical in 3 months.  Schedule labs 1-2 days before her physical.  Fasting labs.  Thanks.

## 2012-08-10 NOTE — Telephone Encounter (Signed)
Please schedule a physical in 3 months.  Schedule labs 1-2 days before physical.

## 2012-08-13 NOTE — Telephone Encounter (Signed)
cpx 7/22 labs 7/16 pt aware of appointments

## 2012-08-27 ENCOUNTER — Ambulatory Visit: Payer: Self-pay | Admitting: Internal Medicine

## 2012-08-27 DIAGNOSIS — Z853 Personal history of malignant neoplasm of breast: Secondary | ICD-10-CM | POA: Diagnosis not present

## 2012-08-27 DIAGNOSIS — R922 Inconclusive mammogram: Secondary | ICD-10-CM | POA: Diagnosis not present

## 2012-08-27 DIAGNOSIS — R928 Other abnormal and inconclusive findings on diagnostic imaging of breast: Secondary | ICD-10-CM | POA: Diagnosis not present

## 2012-08-28 ENCOUNTER — Ambulatory Visit: Payer: Self-pay | Admitting: Internal Medicine

## 2012-08-28 ENCOUNTER — Telehealth: Payer: Self-pay | Admitting: Internal Medicine

## 2012-08-28 DIAGNOSIS — R928 Other abnormal and inconclusive findings on diagnostic imaging of breast: Secondary | ICD-10-CM | POA: Diagnosis not present

## 2012-08-28 DIAGNOSIS — N63 Unspecified lump in unspecified breast: Secondary | ICD-10-CM | POA: Diagnosis not present

## 2012-08-28 NOTE — Telephone Encounter (Signed)
Pt notified of mammogram (Birads 4) results and need for surgery referral.  Refer to Dr Michela Pitcher.

## 2012-09-04 DIAGNOSIS — N63 Unspecified lump in unspecified breast: Secondary | ICD-10-CM | POA: Diagnosis not present

## 2012-09-11 ENCOUNTER — Ambulatory Visit: Payer: Self-pay | Admitting: Surgery

## 2012-09-11 DIAGNOSIS — N63 Unspecified lump in unspecified breast: Secondary | ICD-10-CM | POA: Diagnosis not present

## 2012-09-11 DIAGNOSIS — Z01812 Encounter for preprocedural laboratory examination: Secondary | ICD-10-CM | POA: Diagnosis not present

## 2012-09-11 LAB — BASIC METABOLIC PANEL
Anion Gap: 4 — ABNORMAL LOW (ref 7–16)
BUN: 24 mg/dL — ABNORMAL HIGH (ref 7–18)
Calcium, Total: 9.2 mg/dL (ref 8.5–10.1)
Chloride: 106 mmol/L (ref 98–107)
Co2: 28 mmol/L (ref 21–32)
Creatinine: 0.9 mg/dL (ref 0.60–1.30)
EGFR (Non-African Amer.): 59 — ABNORMAL LOW
Osmolality: 279 (ref 275–301)
Sodium: 138 mmol/L (ref 136–145)

## 2012-09-11 LAB — CBC WITH DIFFERENTIAL/PLATELET
Basophil #: 0 10*3/uL (ref 0.0–0.1)
Basophil %: 0.6 %
HGB: 12.7 g/dL (ref 12.0–16.0)
MCHC: 34.6 g/dL (ref 32.0–36.0)
MCV: 89 fL (ref 80–100)
Monocyte %: 7.7 %
Neutrophil #: 4.5 10*3/uL (ref 1.4–6.5)
Neutrophil %: 54.4 %
RBC: 4.13 10*6/uL (ref 3.80–5.20)
RDW: 13.7 % (ref 11.5–14.5)
WBC: 8.3 10*3/uL (ref 3.6–11.0)

## 2012-09-12 DIAGNOSIS — M25519 Pain in unspecified shoulder: Secondary | ICD-10-CM | POA: Diagnosis not present

## 2012-09-12 DIAGNOSIS — M159 Polyosteoarthritis, unspecified: Secondary | ICD-10-CM | POA: Diagnosis not present

## 2012-09-12 DIAGNOSIS — M810 Age-related osteoporosis without current pathological fracture: Secondary | ICD-10-CM | POA: Diagnosis not present

## 2012-09-18 ENCOUNTER — Ambulatory Visit: Payer: Self-pay | Admitting: Surgery

## 2012-09-18 DIAGNOSIS — M899 Disorder of bone, unspecified: Secondary | ICD-10-CM | POA: Diagnosis not present

## 2012-09-18 DIAGNOSIS — E785 Hyperlipidemia, unspecified: Secondary | ICD-10-CM | POA: Diagnosis not present

## 2012-09-18 DIAGNOSIS — N6019 Diffuse cystic mastopathy of unspecified breast: Secondary | ICD-10-CM | POA: Diagnosis not present

## 2012-09-18 DIAGNOSIS — I2510 Atherosclerotic heart disease of native coronary artery without angina pectoris: Secondary | ICD-10-CM | POA: Diagnosis not present

## 2012-09-18 DIAGNOSIS — Z79899 Other long term (current) drug therapy: Secondary | ICD-10-CM | POA: Diagnosis not present

## 2012-09-18 DIAGNOSIS — N6489 Other specified disorders of breast: Secondary | ICD-10-CM | POA: Diagnosis not present

## 2012-09-18 DIAGNOSIS — N63 Unspecified lump in unspecified breast: Secondary | ICD-10-CM | POA: Diagnosis not present

## 2012-09-18 DIAGNOSIS — Z7982 Long term (current) use of aspirin: Secondary | ICD-10-CM | POA: Diagnosis not present

## 2012-09-18 DIAGNOSIS — Z853 Personal history of malignant neoplasm of breast: Secondary | ICD-10-CM | POA: Diagnosis not present

## 2012-09-19 LAB — PATHOLOGY REPORT

## 2012-09-25 ENCOUNTER — Encounter: Payer: Self-pay | Admitting: Internal Medicine

## 2012-10-10 ENCOUNTER — Ambulatory Visit: Payer: Medicare Other | Admitting: Internal Medicine

## 2012-11-13 ENCOUNTER — Other Ambulatory Visit (INDEPENDENT_AMBULATORY_CARE_PROVIDER_SITE_OTHER): Payer: Medicare Other

## 2012-11-13 DIAGNOSIS — E785 Hyperlipidemia, unspecified: Secondary | ICD-10-CM | POA: Diagnosis not present

## 2012-11-13 DIAGNOSIS — E039 Hypothyroidism, unspecified: Secondary | ICD-10-CM | POA: Diagnosis not present

## 2012-11-13 DIAGNOSIS — I10 Essential (primary) hypertension: Secondary | ICD-10-CM | POA: Diagnosis not present

## 2012-11-13 LAB — BASIC METABOLIC PANEL
CO2: 26 mEq/L (ref 19–32)
Calcium: 9.2 mg/dL (ref 8.4–10.5)
Chloride: 107 mEq/L (ref 96–112)
Glucose, Bld: 91 mg/dL (ref 70–99)
Sodium: 139 mEq/L (ref 135–145)

## 2012-11-13 LAB — LIPID PANEL
Cholesterol: 134 mg/dL (ref 0–200)
LDL Cholesterol: 57 mg/dL (ref 0–99)
Total CHOL/HDL Ratio: 3
Triglycerides: 141 mg/dL (ref 0.0–149.0)
VLDL: 28.2 mg/dL (ref 0.0–40.0)

## 2012-11-13 LAB — HEPATIC FUNCTION PANEL
Albumin: 3.7 g/dL (ref 3.5–5.2)
Alkaline Phosphatase: 72 U/L (ref 39–117)
Total Protein: 6.2 g/dL (ref 6.0–8.3)

## 2012-11-19 ENCOUNTER — Encounter: Payer: Self-pay | Admitting: Internal Medicine

## 2012-11-19 ENCOUNTER — Ambulatory Visit (INDEPENDENT_AMBULATORY_CARE_PROVIDER_SITE_OTHER): Payer: Medicare Other | Admitting: Internal Medicine

## 2012-11-19 VITALS — BP 130/70 | HR 77 | Temp 98.2°F | Ht 64.0 in | Wt 136.5 lb

## 2012-11-19 DIAGNOSIS — I951 Orthostatic hypotension: Secondary | ICD-10-CM

## 2012-11-19 DIAGNOSIS — R55 Syncope and collapse: Secondary | ICD-10-CM | POA: Diagnosis not present

## 2012-11-19 DIAGNOSIS — E785 Hyperlipidemia, unspecified: Secondary | ICD-10-CM | POA: Diagnosis not present

## 2012-11-19 DIAGNOSIS — I10 Essential (primary) hypertension: Secondary | ICD-10-CM

## 2012-11-19 DIAGNOSIS — I2510 Atherosclerotic heart disease of native coronary artery without angina pectoris: Secondary | ICD-10-CM | POA: Diagnosis not present

## 2012-11-19 DIAGNOSIS — G479 Sleep disorder, unspecified: Secondary | ICD-10-CM | POA: Diagnosis not present

## 2012-11-19 DIAGNOSIS — E039 Hypothyroidism, unspecified: Secondary | ICD-10-CM

## 2012-11-19 DIAGNOSIS — N644 Mastodynia: Secondary | ICD-10-CM

## 2012-11-20 ENCOUNTER — Telehealth: Payer: Self-pay | Admitting: Internal Medicine

## 2012-11-20 ENCOUNTER — Other Ambulatory Visit: Payer: Self-pay | Admitting: Internal Medicine

## 2012-11-20 ENCOUNTER — Encounter: Payer: Self-pay | Admitting: Internal Medicine

## 2012-11-20 DIAGNOSIS — R55 Syncope and collapse: Secondary | ICD-10-CM | POA: Insufficient documentation

## 2012-11-20 DIAGNOSIS — Z853 Personal history of malignant neoplasm of breast: Secondary | ICD-10-CM | POA: Insufficient documentation

## 2012-11-20 MED ORDER — FLUDROCORTISONE ACETATE 0.1 MG PO TABS: 0.1000 mg | ORAL_TABLET | Freq: Every day | ORAL | Status: DC

## 2012-11-20 NOTE — Assessment & Plan Note (Signed)
Remains on Lipitor.  On 20mg .  Follow lipid panel and liver function.  Recent lipid panel 11/13/12 revealed total cholesterol 134, triglycerides 141, HDL 49 and LDL 57.  Liver function wnl.

## 2012-11-20 NOTE — Assessment & Plan Note (Signed)
Blood pressure doing better.  Follow.   

## 2012-11-20 NOTE — Assessment & Plan Note (Signed)
Blood pressure as outlined.  Better on Florinef.

## 2012-11-20 NOTE — Telephone Encounter (Signed)
Needs a follow up appt in 6 weeks ( appt).  Thanks.

## 2012-11-20 NOTE — Assessment & Plan Note (Signed)
Sleeping better.  Follow.  

## 2012-11-20 NOTE — Assessment & Plan Note (Addendum)
Had the syncopal episode as outlined.  Unclear etiology.  On Florinef now.  Blood pressure doing better.  She overall has been feeling better.  She has been wearing her support hose.  The syncopal episode occurred before the episode of diarrhea.  She had no associated chest pain or tightness.  EKG obtained and revealed SR with no acute ischemic changes.  Will have cardiology evaluate with question of need for further cardiac w/up.  Obtain carotid ultrasound.

## 2012-11-20 NOTE — Progress Notes (Signed)
Subjective:    Patient ID: Angela Howe, female    DOB: 11-Apr-1926, 79 y.o.   MRN: [id number]  HPI 79 year old female with past history of CAD s/p CABG followed by Dr Mariah Milling, hypercholesterolemia and orthostatic hypotension.  She comes in today to follow up on these issues as well as for a complete physical exam.  States she is doing relatively well.  Back on the Florinef and "her weak spells" have been better.   Able to go to the store, etc.  Feels much better.  She did have a syncopal episode last week.  Was at home and got up to walk to the kitchen.  States she woke up on the floor in her kitchen.  Was not confused.  Got up and called her family to come over.  Able to answer questions appropriately.  Has not had any further episodes of syncope or near syncope recently.  No chest pain or tightness.  No sob.  No nausea or vomiting.  Bowels stable now.  She did have diarrhea for a few days last week - after eating at [facility].  The syncope occurred prior to the diarrhea episode.  Bowel movements are back to normal now.  No blood in the stool, however she did notice some streaks of blood on the tissue when she wiped (on two separate occasions).  This was noticed after teh diarrhea cleared and she ws having more formed stool.  No blood now.  No abdominal pain or cramping.  Does report some increased urinary frequency.  No dysuria or hematuria.     Past Medical History  Diagnosis Date  . CAD (coronary artery disease)   . Orthostatic hypotension   . Syncope   . Breast cancer   . GERD (gastroesophageal reflux disease)   . Osteoarthritis   . Degenerative disc disease   . Hypothyroidism   . Hyperlipidemia     Previous intolerance of statins  . Peptic ulcer disease     Current Outpatient Prescriptions on File Prior to Visit  Medication Sig Dispense Refill  . acetaminophen (TYLENOL) 650 MG CR tablet Take 650 mg by mouth every 8 (eight) hours as needed.        Marland Kitchen amitriptyline (ELAVIL) 50 MG tablet  Take 1 tablet (50 mg total) by mouth at bedtime.  90 tablet  1  . aspirin 81 MG EC tablet Take 81 mg by mouth daily.        Marland Kitchen atorvastatin (LIPITOR) 20 MG tablet Take 1 tablet (20 mg total) by mouth daily.  90 tablet  3  . Calcium-Vitamin D 600-200 MG-UNIT per tablet Take 1 tablet by mouth 2 (two) times daily.        . citalopram (CELEXA) 20 MG tablet Take 1 tablet (20 mg total) by mouth daily.  90 tablet  3  . fludrocortisone (FLORINEF) 0.1 MG tablet Take 1 tablet (0.1 mg total) by mouth daily.  30 tablet  6  . lansoprazole (PREVACID) 30 MG capsule Take 30 mg by mouth daily.        Marland Kitchen levothyroxine (SYNTHROID, LEVOTHROID) 50 MCG tablet Take 1 tablet (50 mcg total) by mouth daily.  90 tablet  3  . Multiple Vitamins-Minerals (PRESERVISION/LUTEIN PO) Take by mouth daily.        . nitroGLYCERIN (NITROSTAT) 0.4 MG SL tablet Place 1 tablet (0.4 mg total) under the tongue every 5 (five) minutes as needed for chest pain.  25 tablet  6   No  current facility-administered medications on file prior to visit.    Review of Systems Patient denies any headache, lightheadedness or dizziness.  No sinus congestion or drainage.  No chest pain, tightness or palpitations. No increased shortness of breath, cough or congestion.  No nausea or vomiting.  No acid reflux.  No abdominal pain or cramping.  No bowel change now.  The previous diarrhea resolved.  Urinary frequency as outlined.  Back on Florinef.  Feeling better.  Feels she is handling stress well.  Sleeping better.  Did have the syncopal episode as outlined.       Objective:   Physical Exam  Filed Vitals:   11/19/12 1540  BP: 130/70  Pulse: 77  Temp: 98.2 F (36.8 C)   Blood pressure recheck:  142/72 lying, 132/68 standing  79 year old female in no acute distress.   HEENT:  Nares- clear.  Oropharynx - without lesions. NECK:  Supple.  Nontender.  No audible bruit.  HEART:  Appears to be regular. LUNGS:  No crackles or wheezing audible.  Respirations  even and unlabored.  RADIAL PULSE:  Equal bilaterally.    BREASTS:  No nipple discharge or nipple retraction present.  Could not appreciate any axillary adenopathy.  Increased tenderness and fullness - left breast.  Well healed incision site.   ABDOMEN:  Soft, nontender.  Bowel sounds present and normal.  No audible abdominal bruit.  GU:  Not performed.    RECTAL:  Not performed.  EXTREMITIES:  No increased edema present.  DP pulses palpable and equal bilaterally.       Assessment & Plan:  INCREASED URINARY FREQUENCY.  No dysuria.  Discussed with her regarding treatment options including bladder training (i.e., going to the bathroom every 1-2 hours to keep bladder empty), avoid drinking at night, etc.  Discussed medications.  She declines.    GI.  Recent episode of rectal bleeding.  Occurred after diarrhea episode.  Discussed further evaluation.  She declines.  Has had no further bleeding.  Follow.    MSK.  Persistent back pain.  Stable.    HEALTH MAINTENANCE.  Physical today.  Had her mammogram in 5/14.  Birads 4.  Saw Dr Colette Ribas.  Biopsy negative.    I spent 45 minutes with the patient and more than 50% of the time was spent in consultation regarding the above.

## 2012-11-20 NOTE — Assessment & Plan Note (Signed)
On thyroid replacement.  Follow tsh.  

## 2012-11-20 NOTE — Assessment & Plan Note (Signed)
No chest pain or tightness.  Follow.

## 2012-11-20 NOTE — Progress Notes (Signed)
Refilled florinef #90 with one refill

## 2012-11-20 NOTE — Assessment & Plan Note (Signed)
Has a known history of breast cancer.  Recent mammogram Birads 4.  Saw Dr Colette Ribas.  S/p biopsy.  Biopsy negative.  Has the increased pain and fullness in teh left breast.  Will refer back to Dr Colette Ribas for evaluation.

## 2012-11-21 NOTE — Telephone Encounter (Signed)
Pt aware of appointment on 9/5 @ 3

## 2012-11-25 DIAGNOSIS — I6529 Occlusion and stenosis of unspecified carotid artery: Secondary | ICD-10-CM | POA: Diagnosis not present

## 2012-12-04 ENCOUNTER — Encounter: Payer: Self-pay | Admitting: Cardiovascular Disease

## 2012-12-04 ENCOUNTER — Ambulatory Visit (INDEPENDENT_AMBULATORY_CARE_PROVIDER_SITE_OTHER): Payer: Medicare Other | Admitting: Cardiovascular Disease

## 2012-12-04 VITALS — BP 118/58 | HR 84 | Ht 64.0 in | Wt 135.5 lb

## 2012-12-04 DIAGNOSIS — R0602 Shortness of breath: Secondary | ICD-10-CM | POA: Diagnosis not present

## 2012-12-04 DIAGNOSIS — I2510 Atherosclerotic heart disease of native coronary artery without angina pectoris: Secondary | ICD-10-CM

## 2012-12-04 DIAGNOSIS — I951 Orthostatic hypotension: Secondary | ICD-10-CM | POA: Diagnosis not present

## 2012-12-04 DIAGNOSIS — R55 Syncope and collapse: Secondary | ICD-10-CM | POA: Diagnosis not present

## 2012-12-04 DIAGNOSIS — E785 Hyperlipidemia, unspecified: Secondary | ICD-10-CM

## 2012-12-04 NOTE — Assessment & Plan Note (Signed)
Currently with no symptoms of angina. No further workup at this time. Continue current medication regimen. 

## 2012-12-04 NOTE — Progress Notes (Signed)
Patient ID: Angela Howe, female    DOB: 12-25-1925, 79 y.o.   MRN: [id number]  HPI Comments: Angela Howe is a very pleasant 79 year old woman with history of coronary artery disease, status post bypass grafting in 2008. She also has a history of hypertension, hyperlipidemia, breast cancer status post left lumpectomy, and gastroesophageal reflux disease.she is maintained on Florinef for orthostatic hypotension.  admission to the hospital on June 10 2010  for diaphoresis, nausea or, dizziness.  cardiac catheterization showed patent LIMA graft, vein graft to the diagonal, occluded vein graft to the PDA though the native RCA was patent, good LV function. She presents for routine followup.   chest pain at the end of August 2013. Stress test was ordered to rule out ischemia. This showed anterior wall defect consistent with breast attenuation artifact, normal ejection fraction, unable to exclude mild apical ischemia  She has severe right hip pain and is not interested in surgery. History of emergency laparoscopic cholecystectomy. Seemed to recover well. Discharged without her Florinef but noticed low blood pressure and having more symptoms of dizziness. Florinef was restarted and symptoms have resolved.   She reports having severe diarrhea several weeks ago. She ate a restaurant, shortly after developed nausea, vomiting. Severe diarrhea. She had symptoms for several days. In this setting, had a syncopal episode shortly after diarrhea have resolved. She was getting up from a chair walking into the kitchen when she had no warning and then found herself on the ground. She hurt the right side of her hip, down to her knee. Chair was knocked over. No postictal symptoms and otherwise felt well. No further symptoms since that time.  She states that she has had carotid ultrasound in the hospital this "looked okay".    EKG shows normal sinus rhythm with rate 83 beats per minute, no significant ST or T wave  changes, diffuse P wave abnormality      Outpatient Encounter Prescriptions as of 77/09/2012  Medication Sig Dispense Refill  . acetaminophen (TYLENOL) 650 MG CR tablet Take 650 mg by mouth every 8 (eight) hours as needed.        Marland Kitchen amitriptyline (ELAVIL) 50 MG tablet Take 1 tablet (50 mg total) by mouth at bedtime.  90 tablet  1  . aspirin 81 MG EC tablet Take 81 mg by mouth daily.        Marland Kitchen atorvastatin (LIPITOR) 20 MG tablet Take 1 tablet (20 mg total) by mouth daily.  90 tablet  3  . Calcium-Vitamin D 600-200 MG-UNIT per tablet Take 1 tablet by mouth 2 (two) times daily.        . citalopram (CELEXA) 20 MG tablet Take 1 tablet (20 mg total) by mouth daily.  90 tablet  3  . fludrocortisone (FLORINEF) 0.1 MG tablet Take 1 tablet (0.1 mg total) by mouth daily.  90 tablet  1  . lansoprazole (PREVACID) 30 MG capsule Take 30 mg by mouth daily.        Marland Kitchen levothyroxine (SYNTHROID, LEVOTHROID) 50 MCG tablet Take 1 tablet (50 mcg total) by mouth daily.  90 tablet  3  . Multiple Vitamins-Minerals (PRESERVISION/LUTEIN PO) Take by mouth daily.        . nitroGLYCERIN (NITROSTAT) 0.4 MG SL tablet Place 1 tablet (0.4 mg total) under the tongue every 5 (five) minutes as needed for chest pain.  25 tablet  6    Review of Systems  Constitutional: Negative.   HENT: Negative.   Eyes: Negative.  Respiratory: Negative.   Cardiovascular: Negative.   Gastrointestinal: Negative.   Musculoskeletal: Positive for arthralgias and gait problem.  Skin: Negative.   Neurological: Negative.   Psychiatric/Behavioral: Negative.   All other systems reviewed and are negative.    BP 108/58  Pulse 84  Ht 5\' 4"  (1.626 m)  Wt 135 lb 8 oz (61.462 kg)  BMI 23.25 kg/m2  Physical Exam  Nursing note and vitals reviewed. Constitutional: She is oriented to person, place, and time. She appears well-developed and well-nourished.  HENT:  Head: Normocephalic.  Nose: Nose normal.  Mouth/Throat: Oropharynx is clear and moist.   Eyes: Conjunctivae are normal. Pupils are equal, round, and reactive to light.  Neck: Normal range of motion. Neck supple. No JVD present.  Cardiovascular: Normal rate, regular rhythm, S1 normal, S2 normal, normal heart sounds and intact distal pulses.  Exam reveals no gallop and no friction rub.   No murmur heard. Pulmonary/Chest: Effort normal and breath sounds normal. No respiratory distress. She has no wheezes. She has no rales. She exhibits no tenderness.  Abdominal: Soft. Bowel sounds are normal. She exhibits no distension. There is no tenderness.  Musculoskeletal: Normal range of motion. She exhibits no edema and no tenderness.  Lymphadenopathy:    She has no cervical adenopathy.  Neurological: She is alert and oriented to person, place, and time. Coordination normal.  Skin: Skin is warm and dry. No rash noted. No erythema.  Psychiatric: She has a normal mood and affect. Her behavior is normal. Judgment and thought content normal.    Assessment and Plan

## 2012-12-04 NOTE — Assessment & Plan Note (Signed)
Cholesterol is at goal on the current lipid regimen. No changes to the medications were made.  

## 2012-12-04 NOTE — Assessment & Plan Note (Signed)
Etiology not clear no unable to exclude vasovagal or hypotensive episode as it seemed to occur after her nausea vomiting and diarrhea episodes. No further episodes since that time in the past several weeks, not even any dizziness.  Blood pressure adequate today, repeat check with systolic pressure 115. Was 130 when she saw Dr. [name]. Tempted to increase her Florinef to 0.2 mg daily but will monitor her for now. I've asked her to monitor her blood pressure at home. If she has any dizzy episodes, would increase her Florinef.  Encouraged high fluid intake as well as salt intake.  If she has any further spells, may also want to consider arrhythmia workup with 48-hour Holter, possibly 30 day monitor if symptoms have been randomly.

## 2012-12-04 NOTE — Assessment & Plan Note (Signed)
Blood pressure appears to be stable. No recent dizziness. Typically she has dizziness prior to any passout spells, none recently.

## 2012-12-04 NOTE — Patient Instructions (Addendum)
You are doing well. No medication changes were made.  Please call us if you have new issues that need to be addressed before your next appt.  Your physician wants you to follow-up in: 6 months.  You will receive a reminder letter in the mail two months in advance. If you don't receive a letter, please call our office to schedule the follow-up appointment.   

## 2012-12-19 DIAGNOSIS — N644 Mastodynia: Secondary | ICD-10-CM | POA: Diagnosis not present

## 2013-01-03 ENCOUNTER — Ambulatory Visit (INDEPENDENT_AMBULATORY_CARE_PROVIDER_SITE_OTHER): Payer: Medicare Other | Admitting: Internal Medicine

## 2013-01-03 ENCOUNTER — Encounter: Payer: Self-pay | Admitting: Internal Medicine

## 2013-01-03 VITALS — BP 130/70 | HR 85 | Temp 97.9°F | Ht 64.0 in | Wt 136.8 lb

## 2013-01-03 DIAGNOSIS — R55 Syncope and collapse: Secondary | ICD-10-CM | POA: Diagnosis not present

## 2013-01-03 DIAGNOSIS — I2510 Atherosclerotic heart disease of native coronary artery without angina pectoris: Secondary | ICD-10-CM | POA: Diagnosis not present

## 2013-01-03 DIAGNOSIS — E039 Hypothyroidism, unspecified: Secondary | ICD-10-CM | POA: Diagnosis not present

## 2013-01-03 DIAGNOSIS — I10 Essential (primary) hypertension: Secondary | ICD-10-CM | POA: Diagnosis not present

## 2013-01-03 DIAGNOSIS — I951 Orthostatic hypotension: Secondary | ICD-10-CM

## 2013-01-03 DIAGNOSIS — C50919 Malignant neoplasm of unspecified site of unspecified female breast: Secondary | ICD-10-CM

## 2013-01-03 DIAGNOSIS — L299 Pruritus, unspecified: Secondary | ICD-10-CM

## 2013-01-03 DIAGNOSIS — E785 Hyperlipidemia, unspecified: Secondary | ICD-10-CM

## 2013-01-03 NOTE — Patient Instructions (Signed)
Allegra (fexofenadine)

## 2013-01-05 ENCOUNTER — Encounter: Payer: Self-pay | Admitting: Internal Medicine

## 2013-01-05 DIAGNOSIS — L299 Pruritus, unspecified: Secondary | ICD-10-CM | POA: Insufficient documentation

## 2013-01-05 NOTE — Assessment & Plan Note (Signed)
On thyroid replacement.  Follow tsh.  

## 2013-01-05 NOTE — Assessment & Plan Note (Signed)
Blood pressure doing better.  Follow.   

## 2013-01-05 NOTE — Assessment & Plan Note (Signed)
Blood pressure as outlined.  Better on Florinef.   

## 2013-01-05 NOTE — Assessment & Plan Note (Signed)
Recent yellow jacket stings.  Persistent itching. No rash.  Allegra as directed.  Follow. Notify me if persistent itching.

## 2013-01-05 NOTE — Assessment & Plan Note (Signed)
No chest pain or tightness.  Follow.   Continues to follow up with Dr Gollan.    

## 2013-01-05 NOTE — Assessment & Plan Note (Signed)
Has a known history of breast cancer.  Recent mammogram Birads 4.  Saw Dr Colette Ribas.  S/p biopsy.  Biopsy negative.  Doing well.  Follow.

## 2013-01-05 NOTE — Assessment & Plan Note (Signed)
Remains on Lipitor.  On 20mg.  Follow lipid panel and liver function.  Recent lipid panel 11/13/12 revealed total cholesterol 134, triglycerides 141, HDL 49 and LDL 57.  Liver function wnl.      

## 2013-01-05 NOTE — Progress Notes (Signed)
Subjective:    Patient ID: Angela Howe, female    DOB: 1926/01/26, 79 y.o.   MRN: [id number]  HPI 79 year old female with past history of CAD s/p CABG followed by Dr Mariah Milling, hypercholesterolemia and orthostatic hypotension.  She comes in today for a scheduled follow up.  States she is doing relatively well.  No chest pain or tightness.  No sob.  No nausea or vomiting.  Bowels stable.  No blood in the stool.  No abdominal pain or cramping.  States she was stung by 5-6 yellow jackets 8/14.  Since that time has noticed that she itches all over.  No rash.  Took benadryl on a couple of occasions.  Helped some.  Overall doing better.  No syncopal episode.     Past Medical History  Diagnosis Date  . CAD (coronary artery disease)   . Orthostatic hypotension   . Syncope   . Breast cancer   . GERD (gastroesophageal reflux disease)   . Osteoarthritis   . Degenerative disc disease   . Hypothyroidism   . Hyperlipidemia     Previous intolerance of statins  . Peptic ulcer disease     Current Outpatient Prescriptions on File Prior to Visit  Medication Sig Dispense Refill  . acetaminophen (TYLENOL) 650 MG CR tablet Take 650 mg by mouth every 8 (eight) hours as needed.        Marland Kitchen amitriptyline (ELAVIL) 50 MG tablet Take 1 tablet (50 mg total) by mouth at bedtime.  90 tablet  1  . aspirin 81 MG EC tablet Take 81 mg by mouth daily.        Marland Kitchen atorvastatin (LIPITOR) 20 MG tablet Take 1 tablet (20 mg total) by mouth daily.  90 tablet  3  . Calcium-Vitamin D 600-200 MG-UNIT per tablet Take 1 tablet by mouth 2 (two) times daily.        . citalopram (CELEXA) 20 MG tablet Take 1 tablet (20 mg total) by mouth daily.  90 tablet  3  . fludrocortisone (FLORINEF) 0.1 MG tablet Take 1 tablet (0.1 mg total) by mouth daily.  90 tablet  1  . lansoprazole (PREVACID) 30 MG capsule Take 30 mg by mouth daily.        Marland Kitchen levothyroxine (SYNTHROID, LEVOTHROID) 50 MCG tablet Take 1 tablet (50 mcg total) by mouth daily.  90 tablet   3  . Multiple Vitamins-Minerals (PRESERVISION/LUTEIN PO) Take by mouth daily.        . nitroGLYCERIN (NITROSTAT) 0.4 MG SL tablet Place 1 tablet (0.4 mg total) under the tongue every 5 (five) minutes as needed for chest pain.  25 tablet  6   No current facility-administered medications on file prior to visit.    Review of Systems Patient denies any headache, lightheadedness or dizziness.  No sinus congestion or drainage.  No chest pain, tightness or palpitations. No increased shortness of breath, cough or congestion.  No nausea or vomiting.  No acid reflux.  No abdominal pain or cramping.  No bowel change.  Feels she is handling stress well.  Sleeping better.  No syncopal episodes.  Feels better.  Does report the itching as outlined.         Objective:   Physical Exam  Filed Vitals:   01/03/13 1511  BP: 130/70  Pulse: 85  Temp: 97.9 F (36.6 C)   Blood pressure recheck:  134/78, pulse 65  79 year old female in no acute distress.   HEENT:  Nares-  clear.  Oropharynx - without lesions. NECK:  Supple.  Nontender.  No audible bruit.  HEART:  Appears to be regular. LUNGS:  No crackles or wheezing audible.  Respirations even and unlabored.  RADIAL PULSE:  Equal bilaterally.  ABDOMEN:  Soft, nontender.  Bowel sounds present and normal.  No audible abdominal bruit.  EXTREMITIES:  No increased edema present.  DP pulses palpable and equal bilaterally.       Assessment & Plan:  GI.  Recent episode of rectal bleeding.  Occurred after diarrhea episode.  See last note for details.  No reoccurrence since.  Discussed further evaluation.  She declines.  Has had no further bleeding.  Follow.    MSK.  Persistent back pain.  Stable.    HEALTH MAINTENANCE.  Physical last visit.  Had her mammogram in 5/14.  Birads 4.  Saw Dr Colette Ribas.  Biopsy negative.

## 2013-01-05 NOTE — Assessment & Plan Note (Signed)
No further syncopal episodes.  On florinef.  Follow.    

## 2013-01-06 ENCOUNTER — Ambulatory Visit: Payer: Medicare Other | Admitting: Cardiovascular Disease

## 2013-01-07 DIAGNOSIS — H353190 Nonexudative age-related macular degeneration, unspecified eye, stage unspecified: Secondary | ICD-10-CM | POA: Diagnosis not present

## 2013-01-16 DIAGNOSIS — Z23 Encounter for immunization: Secondary | ICD-10-CM | POA: Diagnosis not present

## 2013-02-18 ENCOUNTER — Ambulatory Visit: Payer: Self-pay | Admitting: Surgery

## 2013-02-18 DIAGNOSIS — N63 Unspecified lump in unspecified breast: Secondary | ICD-10-CM | POA: Diagnosis not present

## 2013-02-26 DIAGNOSIS — N644 Mastodynia: Secondary | ICD-10-CM | POA: Diagnosis not present

## 2013-03-27 ENCOUNTER — Telehealth: Payer: Self-pay | Admitting: Internal Medicine

## 2013-03-27 NOTE — Telephone Encounter (Signed)
I called pt and she can come in Friday 03/28/13 at 2:45.  Please put her on the schedule.  Thanks.

## 2013-03-28 ENCOUNTER — Ambulatory Visit (INDEPENDENT_AMBULATORY_CARE_PROVIDER_SITE_OTHER): Payer: Medicare Other | Admitting: Internal Medicine

## 2013-03-28 ENCOUNTER — Encounter: Payer: Self-pay | Admitting: Internal Medicine

## 2013-03-28 ENCOUNTER — Encounter (INDEPENDENT_AMBULATORY_CARE_PROVIDER_SITE_OTHER): Payer: Self-pay

## 2013-03-28 VITALS — BP 120/80 | HR 75 | Temp 98.0°F | Ht 64.0 in | Wt 137.5 lb

## 2013-03-28 DIAGNOSIS — I10 Essential (primary) hypertension: Secondary | ICD-10-CM

## 2013-03-28 DIAGNOSIS — I2510 Atherosclerotic heart disease of native coronary artery without angina pectoris: Secondary | ICD-10-CM

## 2013-03-28 DIAGNOSIS — G479 Sleep disorder, unspecified: Secondary | ICD-10-CM

## 2013-03-28 DIAGNOSIS — R55 Syncope and collapse: Secondary | ICD-10-CM | POA: Diagnosis not present

## 2013-03-28 DIAGNOSIS — E785 Hyperlipidemia, unspecified: Secondary | ICD-10-CM

## 2013-03-28 DIAGNOSIS — C50919 Malignant neoplasm of unspecified site of unspecified female breast: Secondary | ICD-10-CM

## 2013-03-28 DIAGNOSIS — I951 Orthostatic hypotension: Secondary | ICD-10-CM

## 2013-03-28 DIAGNOSIS — E039 Hypothyroidism, unspecified: Secondary | ICD-10-CM

## 2013-03-28 NOTE — Progress Notes (Signed)
Pre-visit discussion using our clinic review tool. No additional management support is needed unless otherwise documented below in the visit note.  

## 2013-03-28 NOTE — Telephone Encounter (Signed)
Appointment made

## 2013-03-30 ENCOUNTER — Encounter: Payer: Self-pay | Admitting: Internal Medicine

## 2013-03-30 MED ORDER — CITALOPRAM HYDROBROMIDE 20 MG PO TABS: 20.0000 mg | ORAL_TABLET | Freq: Every day | ORAL | Status: DC

## 2013-03-30 MED ORDER — LEVOTHYROXINE SODIUM 50 MCG PO TABS: 50.0000 ug | ORAL_TABLET | Freq: Every day | ORAL | Status: DC

## 2013-03-30 MED ORDER — ATORVASTATIN CALCIUM 20 MG PO TABS: 20.0000 mg | ORAL_TABLET | Freq: Every day | ORAL | Status: DC

## 2013-03-30 MED ORDER — AMITRIPTYLINE HCL 50 MG PO TABS: 50.0000 mg | ORAL_TABLET | Freq: Every day | ORAL | Status: DC

## 2013-03-30 NOTE — Assessment & Plan Note (Signed)
No further syncopal episodes.  On florinef.  Follow.    

## 2013-03-30 NOTE — Assessment & Plan Note (Signed)
On thyroid replacement.  Follow tsh.  

## 2013-03-30 NOTE — Progress Notes (Signed)
Subjective:    Patient ID: Angela Howe, female    DOB: 12/28/1925, 79 y.o.   MRN: [id number]  HPI 79 year old female with past history of CAD s/p CABG followed by Dr Mariah Milling, hypercholesterolemia and orthostatic hypotension.  She comes in today for a scheduled follow up.  States she is doing relatively well.  No chest pain or tightness.  No sob.  No nausea or vomiting.  Bowels stable.  No blood in the stool.  No abdominal pain or cramping.    Overall doing better.  No syncopal episodes.  Had one episode two months ago when she was shopping for a long time in [place].  Resolved quickly.  No syncope.  Overall better.  Wearing compression stockings.  Has f/u with Dr Mariah Milling in 2/15.  Taking florinef.     Past Medical History  Diagnosis Date  . CAD (coronary artery disease)   . Orthostatic hypotension   . Syncope   . Breast cancer   . GERD (gastroesophageal reflux disease)   . Osteoarthritis   . Degenerative disc disease   . Hypothyroidism   . Hyperlipidemia     Previous intolerance of statins  . Peptic ulcer disease     Current Outpatient Prescriptions on File Prior to Visit  Medication Sig Dispense Refill  . acetaminophen (TYLENOL) 650 MG CR tablet Take 650 mg by mouth every 8 (eight) hours as needed.        Marland Kitchen amitriptyline (ELAVIL) 50 MG tablet Take 1 tablet (50 mg total) by mouth at bedtime.  90 tablet  1  . aspirin 81 MG EC tablet Take 81 mg by mouth daily.        Marland Kitchen atorvastatin (LIPITOR) 20 MG tablet Take 1 tablet (20 mg total) by mouth daily.  90 tablet  3  . Calcium-Vitamin D 600-200 MG-UNIT per tablet Take 1 tablet by mouth 2 (two) times daily.        . citalopram (CELEXA) 20 MG tablet Take 1 tablet (20 mg total) by mouth daily.  90 tablet  3  . fludrocortisone (FLORINEF) 0.1 MG tablet Take 1 tablet (0.1 mg total) by mouth daily.  90 tablet  1  . lansoprazole (PREVACID) 30 MG capsule Take 30 mg by mouth daily.        Marland Kitchen levothyroxine (SYNTHROID, LEVOTHROID) 50 MCG tablet Take 1  tablet (50 mcg total) by mouth daily.  90 tablet  3  . Multiple Vitamins-Minerals (PRESERVISION/LUTEIN PO) Take by mouth daily.        . nitroGLYCERIN (NITROSTAT) 0.4 MG SL tablet Place 1 tablet (0.4 mg total) under the tongue every 5 (five) minutes as needed for chest pain.  25 tablet  6   No current facility-administered medications on file prior to visit.    Review of Systems Patient denies any headache, lightheadedness or dizziness.  No sinus congestion or drainage.  No chest pain, tightness or palpitations. No increased shortness of breath, cough or congestion.  No nausea or vomiting.  No acid reflux.  No abdominal pain or cramping.  No bowel change.  Feels she is handling stress well.  Sleeping better.  No syncopal episodes.  Feels better.         Objective:   Physical Exam  Filed Vitals:   03/28/13 1515  BP: 120/80  Pulse: 75  Temp: 98 F (36.7 C)   Blood pressure recheck:  66/27  79 year old female in no acute distress.   HEENT:  Nares- clear.  Oropharynx - without lesions. NECK:  Supple.  Nontender.  No audible bruit.  HEART:  Appears to be regular. LUNGS:  No crackles or wheezing audible.  Respirations even and unlabored.  RADIAL PULSE:  Equal bilaterally.  ABDOMEN:  Soft, nontender.  Bowel sounds present and normal.  No audible abdominal bruit.  EXTREMITIES:  No increased edema present.  DP pulses palpable and equal bilaterally.       Assessment & Plan:  GI.  Currently doing well.  No further bleeding reported.  Follow.    MSK.  Persistent back pain.  Stable.    HEALTH MAINTENANCE.  Physical 11/19/12.  Had her mammogram in 5/14.  Birads 4.  Saw Dr Colette Ribas.  Biopsy negative.  Had f/u 11/14.  See his note for details.

## 2013-03-30 NOTE — Assessment & Plan Note (Signed)
Blood pressure as outlined.  Better on Florinef.

## 2013-03-30 NOTE — Assessment & Plan Note (Signed)
No chest pain or tightness.  Follow.   Continues to follow up with Dr Gollan.    

## 2013-03-30 NOTE — Assessment & Plan Note (Signed)
Blood pressure doing better.  Follow.   

## 2013-03-30 NOTE — Assessment & Plan Note (Signed)
Sleeping better.  Follow.  

## 2013-03-30 NOTE — Assessment & Plan Note (Signed)
Remains on Lipitor.  On 20mg .  Follow lipid panel and liver function.  Last lipid panel 11/13/12 revealed total cholesterol 134, triglycerides 141, HDL 49 and LDL 57.  Liver function wnl.  Follow.

## 2013-03-30 NOTE — Assessment & Plan Note (Signed)
Has a known history of breast cancer.  Recent mammogram Birads 4.  Saw Dr Colette Ribas.  S/p biopsy.  Biopsy negative.  Had follow up appt with Dr Colette Ribas 03/02/13.  See that note for details.  Follow.

## 2013-04-01 ENCOUNTER — Other Ambulatory Visit (INDEPENDENT_AMBULATORY_CARE_PROVIDER_SITE_OTHER): Payer: Medicare Other

## 2013-04-01 DIAGNOSIS — E785 Hyperlipidemia, unspecified: Secondary | ICD-10-CM | POA: Diagnosis not present

## 2013-04-01 DIAGNOSIS — I10 Essential (primary) hypertension: Secondary | ICD-10-CM

## 2013-04-01 LAB — BASIC METABOLIC PANEL
BUN: 19 mg/dL (ref 6–23)
Calcium: 8.8 mg/dL (ref 8.4–10.5)
GFR: 73.48 mL/min (ref >60.00)
Glucose, Bld: 83 mg/dL (ref 70–99)

## 2013-04-01 LAB — HEPATIC FUNCTION PANEL
ALT: 26 U/L (ref 0–35)
AST: 26 U/L (ref 0–37)
Alkaline Phosphatase: 65 U/L (ref 39–117)
Bilirubin, Direct: 0.1 mg/dL (ref 0.0–0.3)
Total Bilirubin: 0.6 mg/dL (ref 0.3–1.2)

## 2013-04-02 ENCOUNTER — Encounter: Payer: Self-pay | Admitting: *Deleted

## 2013-04-04 ENCOUNTER — Ambulatory Visit: Payer: Medicare Other | Admitting: Internal Medicine

## 2013-05-02 ENCOUNTER — Ambulatory Visit: Payer: Self-pay | Admitting: Medical

## 2013-05-02 DIAGNOSIS — Z90710 Acquired absence of both cervix and uterus: Secondary | ICD-10-CM | POA: Diagnosis not present

## 2013-05-02 DIAGNOSIS — M810 Age-related osteoporosis without current pathological fracture: Secondary | ICD-10-CM | POA: Diagnosis not present

## 2013-05-02 DIAGNOSIS — Z79899 Other long term (current) drug therapy: Secondary | ICD-10-CM | POA: Diagnosis not present

## 2013-05-02 DIAGNOSIS — K1370 Unspecified lesions of oral mucosa: Secondary | ICD-10-CM | POA: Diagnosis not present

## 2013-05-02 DIAGNOSIS — I951 Orthostatic hypotension: Secondary | ICD-10-CM | POA: Diagnosis not present

## 2013-05-02 DIAGNOSIS — M278 Other specified diseases of jaws: Secondary | ICD-10-CM | POA: Diagnosis not present

## 2013-05-02 DIAGNOSIS — Z954 Presence of other heart-valve replacement: Secondary | ICD-10-CM | POA: Diagnosis not present

## 2013-05-02 DIAGNOSIS — E039 Hypothyroidism, unspecified: Secondary | ICD-10-CM | POA: Diagnosis not present

## 2013-05-02 LAB — RAPID STREP-A WITH REFLX: MICRO TEXT REPORT: NEGATIVE

## 2013-05-05 LAB — BETA STREP CULTURE(ARMC)

## 2013-05-06 ENCOUNTER — Encounter: Payer: Self-pay | Admitting: Adult Health

## 2013-05-06 ENCOUNTER — Ambulatory Visit (INDEPENDENT_AMBULATORY_CARE_PROVIDER_SITE_OTHER): Payer: Medicare Other | Admitting: Adult Health

## 2013-05-06 VITALS — BP 126/68 | HR 90 | Temp 98.1°F | Resp 12 | Ht 64.0 in | Wt 136.0 lb

## 2013-05-06 DIAGNOSIS — K1370 Unspecified lesions of oral mucosa: Secondary | ICD-10-CM | POA: Diagnosis not present

## 2013-05-06 DIAGNOSIS — K1379 Other lesions of oral mucosa: Secondary | ICD-10-CM

## 2013-05-06 NOTE — Progress Notes (Signed)
   Subjective:    Patient ID: Angela Howe, female    DOB: [birth year], 79 y.o.   MRN: [id number]  HPI  Patient is a pleasant 80 year old female who presents to clinic status post visit to urgent care this past Friday for oral pain. Patient had noticed an irritation on the roof of her mouth. She reports that she was given acyclovir and triamcinolone. She should complete both these medications this coming Friday. Her symptoms have significantly improved. She denies bleeding, inflammation or any difficulty swallowing or breathing.  Current Outpatient Prescriptions on File Prior to Visit  Medication Sig Dispense Refill  . acetaminophen (TYLENOL) 650 MG CR tablet Take 650 mg by mouth every 8 (eight) hours as needed.        Marland Kitchen amitriptyline (ELAVIL) 50 MG tablet Take 1 tablet (50 mg total) by mouth at bedtime.  90 tablet  1  . aspirin 81 MG EC tablet Take 81 mg by mouth daily.        Marland Kitchen atorvastatin (LIPITOR) 20 MG tablet Take 1 tablet (20 mg total) by mouth daily.  90 tablet  3  . Calcium-Vitamin D 600-200 MG-UNIT per tablet Take 1 tablet by mouth 2 (two) times daily.        . citalopram (CELEXA) 20 MG tablet Take 1 tablet (20 mg total) by mouth daily.  90 tablet  3  . fludrocortisone (FLORINEF) 0.1 MG tablet Take 1 tablet (0.1 mg total) by mouth daily.  90 tablet  1  . lansoprazole (PREVACID) 30 MG capsule Take 30 mg by mouth daily.        Marland Kitchen levothyroxine (SYNTHROID, LEVOTHROID) 50 MCG tablet Take 1 tablet (50 mcg total) by mouth daily.  90 tablet  3  . Multiple Vitamins-Minerals (PRESERVISION/LUTEIN PO) Take by mouth daily.         No current facility-administered medications on file prior to visit.   Review of Systems Positive for oral discomfort, irritation on the hard palate Negative for fever, chills, oral bleeding, inflammation, difficulty swallowing or breathing    Objective:   Physical Exam  Constitutional: She is oriented to person, place, and time. She appears well-developed and  well-nourished. No distress.  HENT:  Hard palate with small area that appears irritated on the right side. No pharyngeal erythema or edema  Cardiovascular: Normal rate and regular rhythm.   Pulmonary/Chest: Effort normal. No respiratory distress.  Lymphadenopathy:    She has no cervical adenopathy.  Neurological: She is alert and oriented to person, place, and time.  Skin: Skin is warm and dry.  Psychiatric: She has a normal mood and affect. Her behavior is normal. Judgment and thought content normal.          Assessment & Plan:

## 2013-05-06 NOTE — Progress Notes (Signed)
Pre visit review using our clinic review tool, if applicable. No additional management support is needed unless otherwise documented below in the visit note. 

## 2013-05-06 NOTE — Assessment & Plan Note (Signed)
Seen in urgent care on Friday. Continue acyclovir and triamcinolone. Area is improvement. She also has appt with her dentist next Tuesday. Follow up as needed.

## 2013-05-26 ENCOUNTER — Other Ambulatory Visit: Payer: Self-pay | Admitting: Internal Medicine

## 2013-06-09 ENCOUNTER — Ambulatory Visit: Payer: Medicare Other | Admitting: Cardiovascular Disease

## 2013-06-13 ENCOUNTER — Ambulatory Visit (INDEPENDENT_AMBULATORY_CARE_PROVIDER_SITE_OTHER): Payer: Medicare Other | Admitting: Cardiovascular Disease

## 2013-06-13 ENCOUNTER — Encounter: Payer: Self-pay | Admitting: Cardiovascular Disease

## 2013-06-13 VITALS — BP 62/40 | HR 97 | Ht 64.0 in | Wt 135.8 lb

## 2013-06-13 DIAGNOSIS — R55 Syncope and collapse: Secondary | ICD-10-CM

## 2013-06-13 DIAGNOSIS — R0602 Shortness of breath: Secondary | ICD-10-CM | POA: Diagnosis not present

## 2013-06-13 DIAGNOSIS — E785 Hyperlipidemia, unspecified: Secondary | ICD-10-CM

## 2013-06-13 DIAGNOSIS — I2510 Atherosclerotic heart disease of native coronary artery without angina pectoris: Secondary | ICD-10-CM

## 2013-06-13 DIAGNOSIS — I951 Orthostatic hypotension: Secondary | ICD-10-CM

## 2013-06-13 DIAGNOSIS — R42 Dizziness and giddiness: Secondary | ICD-10-CM

## 2013-06-13 MED ORDER — FLUDROCORTISONE ACETATE 0.1 MG PO TABS
0.1000 mg | ORAL_TABLET | Freq: Every day | ORAL | Status: DC
Start: 2013-06-13 — End: 2013-07-14

## 2013-06-13 MED ORDER — MIDODRINE HCL 5 MG PO TABS: 5.0000 mg | ORAL_TABLET | Freq: Three times a day (TID) | ORAL | Status: DC | PRN

## 2013-06-13 NOTE — Assessment & Plan Note (Signed)
Recent syncopal episode as before likely from orthostatic hypotension

## 2013-06-13 NOTE — Progress Notes (Signed)
Patient ID: Angela Howe, female    DOB: 11/02/1925, 80 y.o.   MRN: [id number]  HPI Comments: Angela Howe is a very pleasant 80 year old woman with history of coronary artery disease, status post bypass grafting in 2008. She also has a history of hypertension, hyperlipidemia, breast cancer status post left lumpectomy, and gastroesophageal reflux disease. maintained on Florinef for orthostatic hypotension.  admission to the hospital on June 10 2010  for diaphoresis, nausea or, dizziness.  cardiac catheterization showed patent LIMA graft, vein graft to the diagonal, occluded vein graft to the PDA though the native RCA was patent, good LV function. She presents for routine followup.   She reports having recent episode of syncope. She was at [employer] in the checkout when she felt hot, sweaty then had loss of consciousness. She reports it was only for a short period of time. She recovered and go back home.  She reports additional episodes of diaphoresis, feeling hot and sweaty with near syncope. These occurring over the past several weeks. Her initial presentation today blood pressure was 62/40. She was feeling hot, had to sit down . Recheck of her blood pressure was 82/40 on the right. Some time later recheck her blood pressure by myself was 387 systolic and she was feeling better  She continues to have periodic episodes of diarrhea, occasionally takes Imodium. Last episode was 2 weeks ago  Previous episode of chest pain at the end of August 2013. Stress test was ordered to rule out ischemia. This showed anterior wall defect consistent with breast attenuation artifact, normal ejection fraction, unable to exclude mild apical ischemia   EKG shows normal sinus rhythm with rate 97 beats per minute, no significant ST or T wave changes, diffuse P wave abnormality      Outpatient Encounter Prescriptions as of 06/13/76  Medication Sig  . acetaminophen (TYLENOL) 650 MG CR tablet Take 650 mg by mouth every  8 (eight) hours as needed.    Marland Kitchen acyclovir (ZOVIRAX) 400 MG tablet Take 400 mg by mouth 3 (three) times daily.  Marland Kitchen amitriptyline (ELAVIL) 50 MG tablet Take 1 tablet (50 mg total) by mouth at bedtime.  Marland Kitchen aspirin 81 MG EC tablet Take 81 mg by mouth daily.    Marland Kitchen atorvastatin (LIPITOR) 20 MG tablet Take 1 tablet (20 mg total) by mouth daily.  . Calcium-Vitamin D 600-200 MG-UNIT per tablet Take 1 tablet by mouth 2 (two) times daily.    . citalopram (CELEXA) 20 MG tablet Take 1 tablet (20 mg total) by mouth daily.  . fludrocortisone (FLORINEF) 0.1 MG tablet TAKE ONE (1) TABLET EACH DAY  . lansoprazole (PREVACID) 30 MG capsule Take 30 mg by mouth daily.    Marland Kitchen levothyroxine (SYNTHROID, LEVOTHROID) 50 MCG tablet Take 1 tablet (50 mcg total) by mouth daily.  . Multiple Vitamins-Minerals (PRESERVISION/LUTEIN PO) Take by mouth daily.    . nitroGLYCERIN (NITROSTAT) 0.4 MG SL tablet Place 0.4 mg under the tongue every 5 (five) minutes as needed for chest pain.  Marland Kitchen triamcinolone (KENALOG) 0.1 % paste Use as directed 1 application in the mouth or throat. 3-4 times daily     Review of Systems  Constitutional: Negative.   HENT: Negative.   Eyes: Negative.   Respiratory: Negative.   Cardiovascular: Negative.   Gastrointestinal: Negative.   Endocrine: Negative.   Musculoskeletal: Positive for arthralgias and gait problem.  Skin: Negative.   Allergic/Immunologic: Negative.   Neurological: Positive for dizziness and syncope.  Hematological: Negative.   Psychiatric/Behavioral:  Negative.   All other systems reviewed and are negative.    BP 62/40  Pulse 97  Ht 5\' 4"  (1.626 m)  Wt 135 lb 12 oz (61.576 kg)  BMI 23.29 kg/m2 Followup blood pressure 80 systolic, sometime later blood pressure up to 357 systolic and Physical Exam  Nursing note and vitals reviewed. Constitutional: She is oriented to person, place, and time. She appears well-developed and well-nourished.  HENT:  Head: Normocephalic.  Nose:  Nose normal.  Mouth/Throat: Oropharynx is clear and moist.  Eyes: Conjunctivae are normal. Pupils are equal, round, and reactive to light.  Neck: Normal range of motion. Neck supple. No JVD present.  Cardiovascular: Normal rate, regular rhythm, S1 normal, S2 normal, normal heart sounds and intact distal pulses.  Exam reveals no gallop and no friction rub.   No murmur heard. Pulmonary/Chest: Effort normal and breath sounds normal. No respiratory distress. She has no wheezes. She has no rales. She exhibits no tenderness.  Abdominal: Soft. Bowel sounds are normal. She exhibits no distension. There is no tenderness.  Musculoskeletal: Normal range of motion. She exhibits no edema and no tenderness.  Lymphadenopathy:    She has no cervical adenopathy.  Neurological: She is alert and oriented to person, place, and time. Coordination normal.  Skin: Skin is warm and dry. No rash noted. No erythema.  Psychiatric: She has a normal mood and affect. Her behavior is normal. Judgment and thought content normal.    Assessment and Plan

## 2013-06-13 NOTE — Assessment & Plan Note (Signed)
She continues to have severe orthostatic hypotension as documented on today's visit [place] recent episodes of syncope, near syncope. We will add midodrine 2.5 mg in the morning and lunchtime with extra dose of midodrine 2.5 mg when she goes out of the house to do her errands. We have suggested she continues to have symptoms that she call our office. We'll increase the dose up to midodrine 5 mg in the morning and lunchtime with extra dose as needed

## 2013-06-13 NOTE — Patient Instructions (Addendum)
Please start midodrine 1/2 pill in the Am and at lunch Monitor your blood pressure If it runs low, call the office (<110)  Also take an extra 1/2 midodrine pill when you need to go shopping, or out of the house   Please call us if you have new issues that need to be addressed before your next appt.  Your physician wants you to follow-up in: 1 month.

## 2013-06-13 NOTE — Assessment & Plan Note (Signed)
Episodes of dizziness, feeling hot, diaphoresis, likely all from orthostatic hypotension. We'll start new medication as above

## 2013-06-13 NOTE — Assessment & Plan Note (Signed)
Encouraged her to stay on her Lipitor 

## 2013-06-13 NOTE — Assessment & Plan Note (Signed)
Currently with no symptoms of angina. No further workup at this time. Continue current medication regimen. 

## 2013-07-14 ENCOUNTER — Encounter: Payer: Self-pay | Admitting: Cardiovascular Disease

## 2013-07-14 ENCOUNTER — Ambulatory Visit (INDEPENDENT_AMBULATORY_CARE_PROVIDER_SITE_OTHER): Payer: Medicare Other | Admitting: Cardiovascular Disease

## 2013-07-14 VITALS — BP 120/78 | HR 60 | Ht 64.5 in | Wt 134.5 lb

## 2013-07-14 DIAGNOSIS — R55 Syncope and collapse: Secondary | ICD-10-CM | POA: Diagnosis not present

## 2013-07-14 DIAGNOSIS — M25569 Pain in unspecified knee: Secondary | ICD-10-CM

## 2013-07-14 DIAGNOSIS — E785 Hyperlipidemia, unspecified: Secondary | ICD-10-CM | POA: Diagnosis not present

## 2013-07-14 DIAGNOSIS — I951 Orthostatic hypotension: Secondary | ICD-10-CM

## 2013-07-14 DIAGNOSIS — I2510 Atherosclerotic heart disease of native coronary artery without angina pectoris: Secondary | ICD-10-CM | POA: Diagnosis not present

## 2013-07-14 NOTE — Assessment & Plan Note (Signed)
She reports having severe knee pain and is requesting a referral to Dr. Marry Guan. She  was told that she needed a hip replacement. She wants a second opinion

## 2013-07-14 NOTE — Assessment & Plan Note (Signed)
Significant improvement in her orthostasis. Now with no sweaty or dizzy episodes since her prior clinic visit. Blood pressure have been relatively stable though running mildly elevated. We have suggested she hold her Florinef on the weekends, continue on the Florinef on the weekdays and midodrine at a.m. and 1 PM

## 2013-07-14 NOTE — Progress Notes (Signed)
Patient ID: Angela Howe, female    DOB: [birth year], 80 y.o.   MRN: [id number]  HPI Comments: Angela Howe is a very pleasant 80 year old woman with history of coronary artery disease, status post bypass grafting in 2008. She also has a history of hypertension, hyperlipidemia, breast cancer status post left lumpectomy, and gastroesophageal reflux disease. maintained on Florinef for orthostatic hypotension.  admission to the hospital on June 10 2010  for diaphoresis, nausea or, dizziness.  cardiac catheterization showed patent LIMA graft, vein graft to the diagonal, occluded vein graft to the PDA though the native RCA was patent, good LV function. She presents for routine followup.   recent episode of syncope. She was at [employer] in the checkout when she felt hot, sweaty then had loss of consciousness. She reports it was only for a short period of time. On her last clinic visit, She reported additional episodes of diaphoresis, feeling hot and sweaty with near syncope. These occurring over  several weeks. On her last visit, her blood pressure was 62/40. She was feeling hot, had to sit down . Recheck of her blood pressure was 82/40 on the right. Some time later recheck her blood pressure by myself was 485 systolic and she was feeling better  We started monitored and 2.5 mg in the morning and at 1 PM She has continued on a Florinef daily. On this regimen, she denies any further episodes of diaphoresis, sweating, lightheadedness. No near syncope or syncope Blood pressures at home have ranged from 130-160 Her biggest complaint today is severe knee pain and neuropathy. She was seen by Dr. Jefm Bryant and told that she needed a hip replacement  Previous episode of chest pain at the end of August 2013. Stress test was ordered to rule out ischemia. This showed anterior wall defect consistent with breast attenuation artifact, normal ejection fraction, unable to exclude mild apical ischemia      Outpatient  Encounter Prescriptions as of 07/14/2013  Medication Sig  . acetaminophen (TYLENOL) 650 MG CR tablet Take 650 mg by mouth every 8 (eight) hours as needed.    Marland Kitchen amitriptyline (ELAVIL) 50 MG tablet Take 1 tablet (50 mg total) by mouth at bedtime.  Marland Kitchen aspirin 81 MG EC tablet Take 81 mg by mouth daily.    Marland Kitchen atorvastatin (LIPITOR) 20 MG tablet Take 1 tablet (20 mg total) by mouth daily.  . Calcium-Vitamin D 600-200 MG-UNIT per tablet Take 1 tablet by mouth 2 (two) times daily.    . citalopram (CELEXA) 20 MG tablet Take 1 tablet (20 mg total) by mouth daily.  . fludrocortisone (FLORINEF) 0.1 MG tablet Take 0.1 mg by mouth daily.  . lansoprazole (PREVACID) 30 MG capsule Take 30 mg by mouth daily.    Marland Kitchen levothyroxine (SYNTHROID, LEVOTHROID) 50 MCG tablet Take 1 tablet (50 mcg total) by mouth daily.  . midodrine (PROAMATINE) 5 MG tablet Take 1/2 tablet (2.5 mg total) by mouth 3 (three) times daily as needed.  . nitroGLYCERIN (NITROSTAT) 0.4 MG SL tablet Place 0.4 mg under the tongue every 5 (five) minutes as needed for chest pain.   Review of Systems  Constitutional: Negative.   HENT: Negative.   Eyes: Negative.   Respiratory: Negative.   Cardiovascular: Negative.   Gastrointestinal: Negative.   Endocrine: Negative.   Musculoskeletal: Positive for arthralgias and gait problem.  Skin: Negative.   Allergic/Immunologic: Negative.   Neurological: Positive for syncope.  Hematological: Negative.   Psychiatric/Behavioral: Negative.   All other systems reviewed and  are negative.    BP 120/78  Pulse 60  Ht 5' 4.5" (1.638 m)  Wt 134 lb 8 oz (61.009 kg)  BMI 22.74 kg/m2  Physical Exam  Nursing note and vitals reviewed. Constitutional: She is oriented to person, place, and time. She appears well-developed and well-nourished.  HENT:  Head: Normocephalic.  Nose: Nose normal.  Mouth/Throat: Oropharynx is clear and moist.  Eyes: Conjunctivae are normal. Pupils are equal, round, and reactive to  light.  Neck: Normal range of motion. Neck supple. No JVD present.  Cardiovascular: Normal rate, regular rhythm, S1 normal, S2 normal, normal heart sounds and intact distal pulses.  Exam reveals no gallop and no friction rub.   No murmur heard. Pulmonary/Chest: Effort normal and breath sounds normal. No respiratory distress. She has no wheezes. She has no rales. She exhibits no tenderness.  Abdominal: Soft. Bowel sounds are normal. She exhibits no distension. There is no tenderness.  Musculoskeletal: Normal range of motion. She exhibits no edema and no tenderness.  Lymphadenopathy:    She has no cervical adenopathy.  Neurological: She is alert and oriented to person, place, and time. Coordination normal.  Skin: Skin is warm and dry. No rash noted. No erythema.  Psychiatric: She has a normal mood and affect. Her behavior is normal. Judgment and thought content normal.    Assessment and Plan

## 2013-07-14 NOTE — Assessment & Plan Note (Signed)
Cholesterol is at goal on the current lipid regimen. No changes to the medications were made.  

## 2013-07-14 NOTE — Assessment & Plan Note (Signed)
Prior episode of syncope and near-syncope as well as diaphoresis episodes likely from orthostasis, now improved

## 2013-07-14 NOTE — Patient Instructions (Addendum)
You are doing well. Please continue florinef daily, except do not take on the weekend Continue midodrine am and 1 pm  Please call if the blood pressure numbers are running high  Please call us if you have new issues that need to be addressed before your next appt.  Your physician wants you to follow-up in: 3 months.  You will receive a reminder letter in the mail two months in advance. If you don't receive a letter, please call our office to schedule the follow-up appointment.  Appt w/ Ellard Artis, PA at [facility] March 31 @ 2:30

## 2013-07-14 NOTE — Assessment & Plan Note (Signed)
Currently with no symptoms of angina. No further workup at this time. Continue current medication regimen. 

## 2013-07-28 ENCOUNTER — Ambulatory Visit (INDEPENDENT_AMBULATORY_CARE_PROVIDER_SITE_OTHER): Payer: Medicare Other | Admitting: Internal Medicine

## 2013-07-28 ENCOUNTER — Encounter: Payer: Self-pay | Admitting: Internal Medicine

## 2013-07-28 VITALS — BP 146/82 | HR 87 | Temp 98.3°F | Resp 16 | Ht 64.0 in | Wt 135.5 lb

## 2013-07-28 DIAGNOSIS — E785 Hyperlipidemia, unspecified: Secondary | ICD-10-CM | POA: Diagnosis not present

## 2013-07-28 DIAGNOSIS — I951 Orthostatic hypotension: Secondary | ICD-10-CM

## 2013-07-28 DIAGNOSIS — I10 Essential (primary) hypertension: Secondary | ICD-10-CM

## 2013-07-28 DIAGNOSIS — I2510 Atherosclerotic heart disease of native coronary artery without angina pectoris: Secondary | ICD-10-CM | POA: Diagnosis not present

## 2013-07-28 DIAGNOSIS — C50919 Malignant neoplasm of unspecified site of unspecified female breast: Secondary | ICD-10-CM

## 2013-07-28 DIAGNOSIS — E039 Hypothyroidism, unspecified: Secondary | ICD-10-CM

## 2013-07-28 NOTE — Progress Notes (Signed)
Pre visit review using our clinic review tool, if applicable. No additional management support is needed unless otherwise documented below in the visit note. 

## 2013-07-29 DIAGNOSIS — M1610 Unilateral primary osteoarthritis, unspecified hip: Secondary | ICD-10-CM | POA: Diagnosis not present

## 2013-07-29 DIAGNOSIS — M545 Low back pain, unspecified: Secondary | ICD-10-CM | POA: Diagnosis not present

## 2013-07-29 DIAGNOSIS — M169 Osteoarthritis of hip, unspecified: Secondary | ICD-10-CM | POA: Diagnosis not present

## 2013-07-29 DIAGNOSIS — IMO0002 Reserved for concepts with insufficient information to code with codable children: Secondary | ICD-10-CM | POA: Diagnosis not present

## 2013-07-29 DIAGNOSIS — M5430 Sciatica, unspecified side: Secondary | ICD-10-CM | POA: Diagnosis not present

## 2013-07-31 ENCOUNTER — Telehealth: Payer: Self-pay

## 2013-07-31 NOTE — Telephone Encounter (Signed)
Pt left message on vm during lunch asking for call back to advise her on how many ibuprofen she can safely take in one day.  Spoke w/ Dr. Rockey Situ who recommends that pt try Aleve. Spoke w/ pt and advised her of this.  She is agreeable and will call w/ further questions or concerns.

## 2013-08-01 ENCOUNTER — Encounter: Payer: Self-pay | Admitting: Internal Medicine

## 2013-08-01 NOTE — Assessment & Plan Note (Signed)
Blood pressure as outlined.  On florinef and midodrine.  Increased.  Follow.

## 2013-08-01 NOTE — Progress Notes (Signed)
Subjective:    Patient ID: Angela Howe, female    DOB: December 26, 1925, 80 y.o.   MRN: [id number]  HPI 80 year old female with past history of CAD s/p CABG followed by Angela Howe, hypercholesterolemia and orthostatic hypotension.  She comes in today for a scheduled follow up.  States she is doing relatively well.  No chest pain or tightness.  No sob.  No nausea or vomiting.  Bowels stable.  No blood in the stool.  No abdominal pain or cramping.    Overall doing better.  No syncopal episodes.  She has recently seen Angela Howe.   Her medication has been adjusted.  She is now taking midodrine and florinef.   Wearing compression stockings.  States her blood pressure has been averaging 145-165/80-90.  Has a f/u with Angela Howe 10/14/13.  Does not take the midodrine or the florinef on the weekends.  She has been having increased pain in her right knee, right leg, right hip and lower back.  Due to see ortho tomorrow Angela Howe).     Past Medical History  Diagnosis Date  . CAD (coronary artery disease)   . Orthostatic hypotension   . Syncope   . Breast cancer   . GERD (gastroesophageal reflux disease)   . Osteoarthritis   . Degenerative disc disease   . Hypothyroidism   . Hyperlipidemia     Previous intolerance of statins  . Peptic ulcer disease     Current Outpatient Prescriptions on File Prior to Visit  Medication Sig Dispense Refill  . acetaminophen (TYLENOL) 650 MG CR tablet Take 650 mg by mouth every 8 (eight) hours as needed.        Marland Kitchen amitriptyline (ELAVIL) 50 MG tablet Take 1 tablet (50 mg total) by mouth at bedtime.  90 tablet  1  . aspirin 81 MG EC tablet Take 81 mg by mouth daily.        Marland Kitchen atorvastatin (LIPITOR) 20 MG tablet Take 1 tablet (20 mg total) by mouth daily.  90 tablet  3  . Calcium-Vitamin D 600-200 MG-UNIT per tablet Take 1 tablet by mouth 2 (two) times daily.        . citalopram (CELEXA) 20 MG tablet Take 1 tablet (20 mg total) by mouth daily.  90 tablet  3  . fludrocortisone  (FLORINEF) 0.1 MG tablet Take 0.1 mg by mouth daily.      . lansoprazole (PREVACID) 30 MG capsule Take 30 mg by mouth daily.        Marland Kitchen levothyroxine (SYNTHROID, LEVOTHROID) 50 MCG tablet Take 1 tablet (50 mcg total) by mouth daily.  90 tablet  3  . midodrine (PROAMATINE) 5 MG tablet Take 1 tablet (5 mg total) by mouth 3 (three) times daily as needed.  90 tablet  6  . nitroGLYCERIN (NITROSTAT) 0.4 MG SL tablet Place 0.4 mg under the tongue every 5 (five) minutes as needed for chest pain.       No current facility-administered medications on file prior to visit.    Review of Systems Patient denies any headache, lightheadedness or dizziness.  No sinus congestion or drainage.  No chest pain, tightness or palpitations. No increased shortness of breath, cough or congestion.  No nausea or vomiting.  No acid reflux.  No abdominal pain or cramping.  No bowel change.  Feels she is handling stress well.  Sleeping better.  No syncopal episodes.  On midodrine and florinef.  Right knee pain, right leg pain and back  pain- increased.  Due to f/u with ortho tomorrow.          Objective:   Physical Exam  Filed Vitals:   07/28/13 1421  BP: 146/82  Pulse: 87  Temp: 98.3 F (36.8 C)  Resp: 16   Blood pressure recheck:  152/80 lying, 136/78 standing  80 year old female in no acute distress.   HEENT:  Nares- clear.  Oropharynx - without lesions. NECK:  Supple.  Nontender.  No audible bruit.  HEART:  Appears to be regular. LUNGS:  No crackles or wheezing audible.  Respirations even and unlabored.  RADIAL PULSE:  Equal bilaterally.  ABDOMEN:  Soft, nontender.  Bowel sounds present and normal.  No audible abdominal bruit.  EXTREMITIES:  No increased edema present.  DP pulses palpable and equal bilaterally.       Assessment & Plan:  GI.  Currently doing well.  No further bleeding reported.  Follow.    MSK.  Persistent back pain and right leg pain.  Increased lately.  Due to f/u with ortho tomorrow.      HEALTH MAINTENANCE.  Physical 11/19/12.  Had her mammogram in 5/14.  Birads 4.  Saw Angela Angela Howe.  Biopsy negative.  Had f/u 11/14.  See his note for details.

## 2013-08-01 NOTE — Assessment & Plan Note (Signed)
No chest pain or tightness.  Follow.   Continues to follow up with Dr Gollan.    

## 2013-08-01 NOTE — Assessment & Plan Note (Signed)
On thyroid replacement.  Follow tsh.  

## 2013-08-01 NOTE — Assessment & Plan Note (Signed)
Remains on Lipitor.  On 20mg.  Follow lipid panel and liver function.    

## 2013-08-01 NOTE — Assessment & Plan Note (Signed)
Has a known history of breast cancer.  Recent mammogram Birads 4.  Saw Dr Felton Clinton.  S/p biopsy.  Biopsy negative.  Had follow up appt with Dr Felton Clinton 03/02/13.  See that note for details.  Follow.  Schedule a f/u mammogram.

## 2013-08-01 NOTE — Assessment & Plan Note (Signed)
Blood pressure as outlined.  Increased some on midodrine and florinef.  Follow.

## 2013-08-07 ENCOUNTER — Other Ambulatory Visit (INDEPENDENT_AMBULATORY_CARE_PROVIDER_SITE_OTHER): Payer: Medicare Other

## 2013-08-07 DIAGNOSIS — C50919 Malignant neoplasm of unspecified site of unspecified female breast: Secondary | ICD-10-CM

## 2013-08-07 DIAGNOSIS — E039 Hypothyroidism, unspecified: Secondary | ICD-10-CM

## 2013-08-07 DIAGNOSIS — E785 Hyperlipidemia, unspecified: Secondary | ICD-10-CM

## 2013-08-07 DIAGNOSIS — I10 Essential (primary) hypertension: Secondary | ICD-10-CM

## 2013-08-07 LAB — CBC WITH DIFFERENTIAL/PLATELET
BASOS ABS: 0 10*3/uL (ref 0.0–0.1)
Basophils Relative: 0.3 % (ref 0.0–3.0)
EOS ABS: 0.2 10*3/uL (ref 0.0–0.7)
Eosinophils Relative: 3.5 % (ref 0.0–5.0)
HCT: 35.8 % — ABNORMAL LOW (ref 36.0–46.0)
Hemoglobin: 12.2 g/dL (ref 12.0–15.0)
LYMPHS ABS: 2.3 10*3/uL (ref 0.7–4.0)
LYMPHS PCT: 35.4 % (ref 12.0–46.0)
MCHC: 34.2 g/dL (ref 30.0–36.0)
MCV: 90.9 fl (ref 78.0–100.0)
Monocytes Absolute: 0.5 10*3/uL (ref 0.1–1.0)
Monocytes Relative: 7.3 % (ref 3.0–12.0)
Neutro Abs: 3.5 10*3/uL (ref 1.4–7.7)
Neutrophils Relative %: 53.5 % (ref 43.0–77.0)
PLATELETS: 246 10*3/uL (ref 150.0–400.0)
RBC: 3.94 Mil/uL (ref 3.87–5.11)
RDW: 13.1 % (ref 11.5–14.6)
WBC: 6.6 10*3/uL (ref 4.5–10.5)

## 2013-08-07 LAB — HEPATIC FUNCTION PANEL
ALT: 16 U/L (ref 0–35)
AST: 21 U/L (ref 0–37)
Albumin: 3.3 g/dL — ABNORMAL LOW (ref 3.5–5.2)
Alkaline Phosphatase: 64 U/L (ref 39–117)
BILIRUBIN TOTAL: 0.6 mg/dL (ref 0.3–1.2)
Bilirubin, Direct: 0.1 mg/dL (ref 0.0–0.3)
Total Protein: 5.7 g/dL — ABNORMAL LOW (ref 6.0–8.3)

## 2013-08-07 LAB — LIPID PANEL
CHOLESTEROL: 130 mg/dL (ref 0–200)
HDL: 45.2 mg/dL (ref >39.00)
LDL Cholesterol: 66 mg/dL (ref 0–99)
Total CHOL/HDL Ratio: 3
Triglycerides: 94 mg/dL (ref 0.0–149.0)
VLDL: 18.8 mg/dL (ref 0.0–40.0)

## 2013-08-07 LAB — BASIC METABOLIC PANEL
BUN: 23 mg/dL (ref 6–23)
CO2: 27 meq/L (ref 19–32)
Calcium: 8.8 mg/dL (ref 8.4–10.5)
Chloride: 105 mEq/L (ref 96–112)
Creatinine, Ser: 0.8 mg/dL (ref 0.4–1.2)
GFR: 70.32 mL/min (ref >60.00)
GLUCOSE: 85 mg/dL (ref 70–99)
Potassium: 4.3 mEq/L (ref 3.5–5.1)
SODIUM: 141 meq/L (ref 135–145)

## 2013-08-07 LAB — TSH: TSH: 1.28 u[IU]/mL (ref 0.35–5.50)

## 2013-08-11 ENCOUNTER — Encounter: Payer: Self-pay | Admitting: *Deleted

## 2013-09-01 ENCOUNTER — Telehealth: Payer: Self-pay

## 2013-09-01 ENCOUNTER — Other Ambulatory Visit: Payer: Self-pay

## 2013-09-01 NOTE — Telephone Encounter (Signed)
Pt states Dr. Rockey Situ called her in some medication for her BP, she doesn't remember the name, [place], and needs another refill.

## 2013-09-02 NOTE — Telephone Encounter (Signed)
Spoke with patient regarding the medication midodrine that she has left at home and is at the beach and needs a refill. The patient will contact the pharmacy regarding a refill for midodrine.

## 2013-09-10 ENCOUNTER — Ambulatory Visit: Payer: Self-pay | Admitting: Internal Medicine

## 2013-09-10 DIAGNOSIS — Z1231 Encounter for screening mammogram for malignant neoplasm of breast: Secondary | ICD-10-CM | POA: Diagnosis not present

## 2013-09-10 LAB — HM MAMMOGRAPHY: HM Mammogram: NEGATIVE

## 2013-09-11 ENCOUNTER — Encounter: Payer: Self-pay | Admitting: Internal Medicine

## 2013-09-24 ENCOUNTER — Emergency Department: Payer: Self-pay | Admitting: Emergency Medicine

## 2013-09-24 DIAGNOSIS — Z0389 Encounter for observation for other suspected diseases and conditions ruled out: Secondary | ICD-10-CM | POA: Diagnosis not present

## 2013-09-24 DIAGNOSIS — M546 Pain in thoracic spine: Secondary | ICD-10-CM | POA: Diagnosis not present

## 2013-09-24 DIAGNOSIS — Z79899 Other long term (current) drug therapy: Secondary | ICD-10-CM | POA: Diagnosis not present

## 2013-09-24 DIAGNOSIS — S8000XA Contusion of unspecified knee, initial encounter: Secondary | ICD-10-CM | POA: Diagnosis not present

## 2013-09-24 DIAGNOSIS — S239XXA Sprain of unspecified parts of thorax, initial encounter: Secondary | ICD-10-CM | POA: Diagnosis not present

## 2013-09-24 DIAGNOSIS — Z888 Allergy status to other drugs, medicaments and biological substances status: Secondary | ICD-10-CM | POA: Diagnosis not present

## 2013-09-24 DIAGNOSIS — IMO0002 Reserved for concepts with insufficient information to code with codable children: Secondary | ICD-10-CM | POA: Diagnosis not present

## 2013-10-02 ENCOUNTER — Ambulatory Visit: Payer: Self-pay | Admitting: Internal Medicine

## 2013-10-02 ENCOUNTER — Encounter: Payer: Self-pay | Admitting: Internal Medicine

## 2013-10-02 ENCOUNTER — Ambulatory Visit (INDEPENDENT_AMBULATORY_CARE_PROVIDER_SITE_OTHER): Payer: Medicare Other | Admitting: Internal Medicine

## 2013-10-02 VITALS — BP 110/50 | HR 79 | Temp 97.8°F | Wt 137.0 lb

## 2013-10-02 DIAGNOSIS — M545 Low back pain, unspecified: Secondary | ICD-10-CM | POA: Diagnosis not present

## 2013-10-02 DIAGNOSIS — I2510 Atherosclerotic heart disease of native coronary artery without angina pectoris: Secondary | ICD-10-CM | POA: Diagnosis not present

## 2013-10-02 DIAGNOSIS — M5137 Other intervertebral disc degeneration, lumbosacral region: Secondary | ICD-10-CM | POA: Diagnosis not present

## 2013-10-02 DIAGNOSIS — I951 Orthostatic hypotension: Secondary | ICD-10-CM | POA: Diagnosis not present

## 2013-10-02 DIAGNOSIS — R55 Syncope and collapse: Secondary | ICD-10-CM | POA: Diagnosis not present

## 2013-10-02 DIAGNOSIS — M25569 Pain in unspecified knee: Secondary | ICD-10-CM

## 2013-10-02 DIAGNOSIS — IMO0002 Reserved for concepts with insufficient information to code with codable children: Secondary | ICD-10-CM | POA: Diagnosis not present

## 2013-10-02 DIAGNOSIS — M549 Dorsalgia, unspecified: Secondary | ICD-10-CM

## 2013-10-02 DIAGNOSIS — M47817 Spondylosis without myelopathy or radiculopathy, lumbosacral region: Secondary | ICD-10-CM | POA: Diagnosis not present

## 2013-10-02 DIAGNOSIS — C50919 Malignant neoplasm of unspecified site of unspecified female breast: Secondary | ICD-10-CM

## 2013-10-02 NOTE — Progress Notes (Signed)
Pre visit review using our clinic review tool, if applicable. No additional management support is needed unless otherwise documented below in the visit note. 

## 2013-10-05 ENCOUNTER — Encounter: Payer: Self-pay | Admitting: Internal Medicine

## 2013-10-05 DIAGNOSIS — M549 Dorsalgia, unspecified: Secondary | ICD-10-CM | POA: Insufficient documentation

## 2013-10-05 NOTE — Assessment & Plan Note (Signed)
No further syncopal episodes.  On florinef.  Follow.

## 2013-10-05 NOTE — Assessment & Plan Note (Signed)
No chest pain or tightness.  Follow.   Continues to follow up with Dr Rockey Situ.

## 2013-10-05 NOTE — Assessment & Plan Note (Signed)
Has a known history of breast cancer.  Recent mammogram Birads 4.  Saw Dr Felton Clinton.  S/p biopsy.  Biopsy negative.  Had follow up appt with Dr Felton Clinton 03/02/13.  See that note for details.  Follow up mammogram 09/10/13 - Birads I.

## 2013-10-05 NOTE — Progress Notes (Signed)
Subjective:    Patient ID: Angela Howe, female    DOB: 1925/09/27, 80 y.o.   MRN: [id number]  HPI 80 year old female with past history of CAD s/p CABG followed by Dr Rockey Situ, hypercholesterolemia and orthostatic hypotension.  She comes in today for an ER follow up.  States she fell last Wednesday.  She was carrying the trash out and tripped and fell forward.  No syncope or near syncope.  She did not hit her head.  To ER.  Had thoracic spine xray that revealed a stable mild mid thoracic wedge compression deformity with no acute abnormality.  Left knee xray revealed no acute osseous findings or joint effusion.  States was given something for muscle spasm.  Main complaint now is that of lower back pain.  Taking alleve.   Still with increased pain.  No chest pain or tightness.  No sob.  No nausea or vomiting.  Bowels stable.  No blood in the stool.  No abdominal pain or cramping.     She is on florinef.   Wearing compression stockings.  States her blood pressure has been averaging 130s/70s.  Has a f/u with Dr Rockey Situ 10/14/13.     Past Medical History  Diagnosis Date  . CAD (coronary artery disease)   . Orthostatic hypotension   . Syncope   . Breast cancer   . GERD (gastroesophageal reflux disease)   . Osteoarthritis   . Degenerative disc disease   . Hypothyroidism   . Hyperlipidemia     Previous intolerance of statins  . Peptic ulcer disease     Current Outpatient Prescriptions on File Prior to Visit  Medication Sig Dispense Refill  . amitriptyline (ELAVIL) 50 MG tablet Take 1 tablet (50 mg total) by mouth at bedtime.  90 tablet  1  . aspirin 81 MG EC tablet Take 81 mg by mouth daily.        Marland Kitchen atorvastatin (LIPITOR) 20 MG tablet Take 1 tablet (20 mg total) by mouth daily.  90 tablet  3  . Calcium-Vitamin D 600-200 MG-UNIT per tablet Take 1 tablet by mouth 2 (two) times daily.        . citalopram (CELEXA) 20 MG tablet Take 1 tablet (20 mg total) by mouth daily.  90 tablet  3  . lansoprazole  (PREVACID) 30 MG capsule Take 30 mg by mouth daily.        Marland Kitchen levothyroxine (SYNTHROID, LEVOTHROID) 50 MCG tablet Take 1 tablet (50 mcg total) by mouth daily.  90 tablet  3  . nitroGLYCERIN (NITROSTAT) 0.4 MG SL tablet Place 0.4 mg under the tongue every 5 (five) minutes as needed for chest pain.      Marland Kitchen acetaminophen (TYLENOL) 650 MG CR tablet Take 650 mg by mouth every 8 (eight) hours as needed.        . fludrocortisone (FLORINEF) 0.1 MG tablet Take 0.1 mg by mouth daily.      . midodrine (PROAMATINE) 5 MG tablet Take 1 tablet (5 mg total) by mouth 3 (three) times daily as needed.  90 tablet  6   No current facility-administered medications on file prior to visit.    Review of Systems Patient denies any headache, lightheadedness or dizziness.  No sinus congestion or drainage.  No chest pain, tightness or palpitations. No increased shortness of breath, cough or congestion.  No nausea or vomiting.  No acid reflux.  No abdominal pain or cramping.  No bowel change.  Feels she is handling  stress well.  Sleeping better.  No syncopal episodes.  Increased back pain since her recent fall.           Objective:   Physical Exam  Filed Vitals:   10/02/13 1433  BP: 110/50  Pulse: 79  Temp: 97.8 F (36.6 C)   Blood pressure recheck:  138/80 lying, 120/78 standing  80 year old female in no acute distress.   HEENT:  Nares- clear.  Oropharynx - without lesions. NECK:  Supple.  Nontender.  No audible bruit.  HEART:  Appears to be regular. LUNGS:  No crackles or wheezing audible.  Respirations even and unlabored.  RADIAL PULSE:  Equal bilaterally.  ABDOMEN:  Soft, nontender.  Bowel sounds present and normal.  No audible abdominal bruit.  EXTREMITIES:  No increased edema present.  DP pulses palpable and equal bilaterally.       Assessment & Plan:  GI.  Currently doing well.  No further bleeding reported.  Follow.    HEALTH MAINTENANCE.  Physical 11/19/12.  Had her mammogram in 5/14.  Birads 4.  Saw  Dr Felton Clinton.  Biopsy negative.  Had f/u mammogram 09/10/13 - Birads I.   I spent 25 minutes with the patient and more than 50% of the time was spent in consultation regarding the above.

## 2013-10-05 NOTE — Assessment & Plan Note (Signed)
Blood pressure as outlined.  On florinef.  Follow.  Continue compression stockings.

## 2013-10-05 NOTE — Assessment & Plan Note (Signed)
Knee pain s/p fall.  Had xray in ER.  No acute abnormality.  Follow.  Tylenol as needed.

## 2013-10-05 NOTE — Assessment & Plan Note (Signed)
Persistent back pain s/p fall.  Will check L-S spine xray.  Pain mostly localized to this region.  Rule out compression fracture.  Take tylenol as scheduled.

## 2013-10-14 ENCOUNTER — Telehealth: Payer: Self-pay | Admitting: Internal Medicine

## 2013-10-14 ENCOUNTER — Encounter: Payer: Self-pay | Admitting: Cardiovascular Disease

## 2013-10-14 ENCOUNTER — Ambulatory Visit (INDEPENDENT_AMBULATORY_CARE_PROVIDER_SITE_OTHER): Payer: Medicare Other | Admitting: Cardiovascular Disease

## 2013-10-14 VITALS — BP 132/86 | HR 96 | Ht 64.0 in | Wt 133.5 lb

## 2013-10-14 DIAGNOSIS — M549 Dorsalgia, unspecified: Secondary | ICD-10-CM | POA: Diagnosis not present

## 2013-10-14 DIAGNOSIS — R55 Syncope and collapse: Secondary | ICD-10-CM

## 2013-10-14 DIAGNOSIS — I951 Orthostatic hypotension: Secondary | ICD-10-CM | POA: Diagnosis not present

## 2013-10-14 DIAGNOSIS — E785 Hyperlipidemia, unspecified: Secondary | ICD-10-CM

## 2013-10-14 DIAGNOSIS — I2510 Atherosclerotic heart disease of native coronary artery without angina pectoris: Secondary | ICD-10-CM | POA: Diagnosis not present

## 2013-10-14 NOTE — Assessment & Plan Note (Signed)
Will recommend that she stay on her Lipitor

## 2013-10-14 NOTE — Patient Instructions (Signed)
Please hold the Florinef on the weekends,  continue on the Florinef on the weekdays and midodrine at a.m. and 1 PM  We will contact Dr. Nicki Reaper about your back  Please call us if you have new issues that need to be addressed before your next appt.  Your physician wants you to follow-up in: 6 months.  You will receive a reminder letter in the mail two months in advance. If you don't receive a letter, please call our office to schedule the follow-up appointment.

## 2013-10-14 NOTE — Assessment & Plan Note (Signed)
Prior episode likely from orthostasis. Blood pressure seems to be supported with her Florinef and midodrine.

## 2013-10-14 NOTE — Progress Notes (Signed)
Patient ID: Angela Howe, female    DOB: 1925-11-14, 80 y.o.   MRN: [id number]  HPI Comments: [name] is a very pleasant 80 year old woman with history of coronary artery disease, status post bypass grafting in 2008. She also has a history of hypertension, hyperlipidemia, breast cancer status post left lumpectomy, and gastroesophageal reflux disease. maintained on Florinef and midodrine for orthostatic hypotension.  admission to the hospital on June 10 2010  for diaphoresis, nausea or, dizziness.  cardiac catheterization showed patent LIMA graft, vein graft to the diagonal, occluded vein graft to the PDA though the native RCA was patent, good LV function. She presents for routine followup.   Prior episode of syncope. She was at [employer] in the checkout when she felt hot, sweaty then had loss of consciousness. She reports it was only for a short period of time. She also reported additional episodes of diaphoresis, feeling hot and sweaty with near syncope. These occurring over  several weeks. On a prior visit,, her blood pressure was 62/40. She was feeling hot, had to sit down . Recheck of her blood pressure was 82/40 on the right. Some time later recheck her blood pressure by myself was 240 systolic and she was feeling better  In followup today, she is taking Florinef on weekdays, midodrine twice a day every day No further episodes of near syncope or syncope. No further episodes of feeling hot or sweaty  She does report having a fall several weeks ago. She fell forward, landed on her stomach. Since then she has had severe low back pain radiating bilaterally across the lower lumbar region. She had back x-rays Sep 24 2013 when she fell, repeat x-rays 10/02/2013. Most recent June x-ray suggesting moderate compression fracture of L3. Prior L4 kyphoplasty  History of chronic severe knee pain and neuropathy. She was seen by Dr. Jefm Bryant and told that she needed a hip replacement  Previous episode of  chest pain at the end of August 2013. Stress test was ordered to rule out ischemia. This showed anterior wall defect consistent with breast attenuation artifact, normal ejection fraction, unable to exclude mild apical ischemia  EKG today shows normal sinus rhythm with rate 96 beats per minute, no significant ST or T wave changes      Outpatient Encounter Prescriptions as of 10/14/2013  Medication Sig  . acetaminophen (TYLENOL) 650 MG CR tablet Take 650 mg by mouth every 8 (eight) hours as needed.    Marland Kitchen amitriptyline (ELAVIL) 50 MG tablet Take 1 tablet (50 mg total) by mouth at bedtime.  Marland Kitchen aspirin 81 MG EC tablet Take 81 mg by mouth daily.    Marland Kitchen atorvastatin (LIPITOR) 20 MG tablet Take 1 tablet (20 mg total) by mouth daily.  . Calcium-Vitamin D 600-200 MG-UNIT per tablet Take 1 tablet by mouth 2 (two) times daily.    . citalopram (CELEXA) 20 MG tablet Take 1 tablet (20 mg total) by mouth daily.  . fludrocortisone (FLORINEF) 0.1 MG tablet Take 0.1 mg by mouth daily.  . lansoprazole (PREVACID) 30 MG capsule Take 30 mg by mouth daily.    Marland Kitchen levothyroxine (SYNTHROID, LEVOTHROID) 50 MCG tablet Take 1 tablet (50 mcg total) by mouth daily.  . midodrine (PROAMATINE) 5 MG tablet Take 1 tablet (5 mg total) by mouth 3 (three) times daily as needed.  . naproxen sodium (ANAPROX) 220 MG tablet Take 220 mg by mouth 2 (two) times daily with a meal.  . nitroGLYCERIN (NITROSTAT) 0.4 MG SL tablet Place  0.4 mg under the tongue every 5 (five) minutes as needed for chest pain.    Review of Systems  Constitutional: Negative.   HENT: Negative.   Eyes: Negative.   Respiratory: Negative.   Cardiovascular: Negative.   Gastrointestinal: Negative.   Endocrine: Negative.   Musculoskeletal: Positive for arthralgias, back pain and gait problem.  Skin: Negative.   Allergic/Immunologic: Negative.   Neurological: Negative.   Hematological: Negative.   Psychiatric/Behavioral: Negative.   All other systems reviewed and  are negative.   BP 132/86  Pulse 96  Ht 5\' 4"  (1.626 m)  Wt 133 lb 8 oz (60.555 kg)  BMI 22.90 kg/m2  Physical Exam  Nursing note and vitals reviewed. Constitutional: She is oriented to person, place, and time. She appears well-developed and well-nourished.  HENT:  Head: Normocephalic.  Nose: Nose normal.  Mouth/Throat: Oropharynx is clear and moist.  Eyes: Conjunctivae are normal. Pupils are equal, round, and reactive to light.  Neck: Normal range of motion. Neck supple. No JVD present.  Cardiovascular: Normal rate, regular rhythm, S1 normal, S2 normal, normal heart sounds and intact distal pulses.  Exam reveals no gallop and no friction rub.   No murmur heard. Pulmonary/Chest: Effort normal and breath sounds normal. No respiratory distress. She has no wheezes. She has no rales. She exhibits no tenderness.  Abdominal: Soft. Bowel sounds are normal. She exhibits no distension. There is no tenderness.  Musculoskeletal: Normal range of motion. She exhibits no edema and no tenderness.  Lymphadenopathy:    She has no cervical adenopathy.  Neurological: She is alert and oriented to person, place, and time. Coordination normal.  Skin: Skin is warm and dry. No rash noted. No erythema.  Psychiatric: She has a normal mood and affect. Her behavior is normal. Judgment and thought content normal.    Assessment and Plan

## 2013-10-14 NOTE — Assessment & Plan Note (Signed)
Blood pressure appears relatively stable. Suggested she continue her current regimen. Symptoms seem to have resolved, with no further near syncope or syncope

## 2013-10-14 NOTE — Telephone Encounter (Signed)
Notify pt that her back xray revealed what appeared to be a mild compression fracture.  Unclear when this occurred.  If she is having increased pain, then I would like to refer her to ortho for further evaluation and question of need for further intervention (i.e., kyphoplasty, etc).  If agreeable, let me know and I will place the order for the referral.  Also, let me now if she has a preference of who she sees.

## 2013-10-14 NOTE — Assessment & Plan Note (Signed)
Currently with no symptoms of angina. No further workup at this time. Continue current medication regimen. 

## 2013-10-14 NOTE — Assessment & Plan Note (Signed)
She reports back pain has persisted since her fall. It is severe, she is relatively debilitated. Difficulty standing from a sitting position secondary to severe pain. Recent x-ray of her back suggesting compression fracture. Suggest she might need referral for repeat kyphoplasty of L4.

## 2013-10-15 NOTE — Telephone Encounter (Signed)
Pt notified & would like to see Dr. Marry Guan

## 2013-10-16 ENCOUNTER — Telehealth: Payer: Self-pay | Admitting: *Deleted

## 2013-10-16 NOTE — Telephone Encounter (Signed)
Will need to be seen prior to pain medication being called in.  Does Raquel have opening.  Thanks.    Dr Nicki Reaper

## 2013-10-16 NOTE — Telephone Encounter (Signed)
Order placed for ortho referral.   

## 2013-10-16 NOTE — Telephone Encounter (Signed)
Pt states she is having back pain, was seen in office 10/02/13. States Tylenol and Ibuprofen are not effective. Would like something else to help. Has appt with Dr. Eliberto Ivory 11/07/13.

## 2013-10-17 ENCOUNTER — Other Ambulatory Visit: Payer: Self-pay | Admitting: Internal Medicine

## 2013-10-17 NOTE — Telephone Encounter (Signed)
Pt notified, appt scheduled with Dr. Nicki Reaper on Monday for evaluation.

## 2013-10-17 NOTE — Telephone Encounter (Signed)
Last OV 6.4.15, last refill 11.30.14.  Please advise refill.

## 2013-10-19 NOTE — Telephone Encounter (Signed)
Refilled amitriptyline #90 with one refill.

## 2013-10-20 ENCOUNTER — Encounter: Payer: Self-pay | Admitting: Internal Medicine

## 2013-10-20 ENCOUNTER — Ambulatory Visit (INDEPENDENT_AMBULATORY_CARE_PROVIDER_SITE_OTHER): Payer: Medicare Other | Admitting: Internal Medicine

## 2013-10-20 VITALS — BP 130/80 | HR 90 | Temp 98.3°F | Ht 64.0 in | Wt 133.5 lb

## 2013-10-20 DIAGNOSIS — I10 Essential (primary) hypertension: Secondary | ICD-10-CM | POA: Diagnosis not present

## 2013-10-20 DIAGNOSIS — I2510 Atherosclerotic heart disease of native coronary artery without angina pectoris: Secondary | ICD-10-CM

## 2013-10-20 DIAGNOSIS — M545 Low back pain, unspecified: Secondary | ICD-10-CM

## 2013-10-20 LAB — CREATININE, SERUM: Creatinine, Ser: 1 mg/dL (ref 0.4–1.2)

## 2013-10-20 MED ORDER — HYDROCODONE-ACETAMINOPHEN 5-325 MG PO TABS: 1.0000 | ORAL_TABLET | Freq: Two times a day (BID) | ORAL | Status: DC | PRN

## 2013-10-20 NOTE — Progress Notes (Signed)
Pre visit review using our clinic review tool, if applicable. No additional management support is needed unless otherwise documented below in the visit note. 

## 2013-10-20 NOTE — Progress Notes (Signed)
Subjective:    Patient ID: Angela Howe, female    DOB: 21-Nov-1925, 80 y.o.   MRN: [id number]  Back Pain  80 year old female with past history of CAD s/p CABG followed by Dr Rockey Situ, hypercholesterolemia and orthostatic hypotension.  She comes in today as a work in with concerns regarding persistent back pain.  States she fell several weeks ago.  She was carrying the trash out and tripped and fell forward.  No syncope or near syncope.  She did not hit her head.  To ER.  Had thoracic spine xray that revealed a stable mild mid thoracic wedge compression deformity with no acute abnormality.  Left knee xray revealed no acute osseous findings or joint effusion.  States was given something for muscle spasm.  Main complaint now is that of lower back pain.  Taking alleve and tylenol.  Still with increased pain.  No chest pain or tightness.  No sob.  No nausea or vomiting.  Bowels stable.  No blood in the stool.  No abdominal pain or cramping.     She is on florinef.   Wearing compression stockings.  Blood pressure has been better since doing this.  Pain has increased recently.  Having trouble with standing and walking.     Past Medical History  Diagnosis Date  . CAD (coronary artery disease)   . Orthostatic hypotension   . Syncope   . Breast cancer   . GERD (gastroesophageal reflux disease)   . Osteoarthritis   . Degenerative disc disease   . Hypothyroidism   . Hyperlipidemia     Previous intolerance of statins  . Peptic ulcer disease     Current Outpatient Prescriptions on File Prior to Visit  Medication Sig Dispense Refill  . acetaminophen (TYLENOL) 650 MG CR tablet Take 650 mg by mouth every 8 (eight) hours as needed.        Marland Kitchen amitriptyline (ELAVIL) 50 MG tablet Take 1 tablet (50 mg total) by mouth at bedtime.  90 tablet  1  . aspirin 81 MG EC tablet Take 81 mg by mouth daily.        Marland Kitchen atorvastatin (LIPITOR) 20 MG tablet Take 1 tablet (20 mg total) by mouth daily.  90 tablet  3  .  Calcium-Vitamin D 600-200 MG-UNIT per tablet Take 1 tablet by mouth 2 (two) times daily.        . citalopram (CELEXA) 20 MG tablet Take 1 tablet (20 mg total) by mouth daily.  90 tablet  3  . fludrocortisone (FLORINEF) 0.1 MG tablet Take 0.1 mg by mouth daily.      . lansoprazole (PREVACID) 30 MG capsule Take 30 mg by mouth daily.        Marland Kitchen levothyroxine (SYNTHROID, LEVOTHROID) 50 MCG tablet Take 1 tablet (50 mcg total) by mouth daily.  90 tablet  3  . midodrine (PROAMATINE) 5 MG tablet Take 1 tablet (5 mg total) by mouth 3 (three) times daily as needed.  90 tablet  6  . naproxen sodium (ANAPROX) 220 MG tablet Take 220 mg by mouth 2 (two) times daily with a meal.      . nitroGLYCERIN (NITROSTAT) 0.4 MG SL tablet Place 0.4 mg under the tongue every 5 (five) minutes as needed for chest pain.       No current facility-administered medications on file prior to visit.    Review of Systems  Musculoskeletal: Positive for back pain.  Patient denies any headache, lightheadedness or dizziness.  No sinus congestion or drainage.  No chest pain, tightness or palpitations. No increased shortness of breath, cough or congestion.  No nausea or vomiting.  No acid reflux.  No abdominal pain or cramping.  No bowel change.  Increased back pain as outlined.  No pain radiating down her leg.           Objective:   Physical Exam  Filed Vitals:   10/20/13 1322  BP: 130/80  Pulse: 90  Temp: 98.3 F (60.28 C)   80 year old female in no acute distress.  NECK:  Supple.  Nontender.  No audible bruit.  HEART:  Appears to be regular. LUNGS:  No crackles or wheezing audible.  Respirations even and unlabored.  RADIAL PULSE:  Equal bilaterally.  ABDOMEN:  Soft, nontender.  Bowel sounds present and normal.  No audible abdominal bruit.  MSK:  Increased back pain to palpation lower back.  Increased pain with going from seated to standing position.  Also increased pain with getting up on the table and with lying down and  from lying to sitting.        Assessment & Plan:  GI.  Currently doing well.  No further bleeding reported.  Follow.    HEALTH MAINTENANCE.  Physical 11/19/12.  Had her mammogram in 5/14.  Birads 4.  Saw Dr Felton Clinton.  Biopsy negative.  Had f/u mammogram 09/10/13 - Birads I.

## 2013-10-22 ENCOUNTER — Inpatient Hospital Stay: Payer: Self-pay | Admitting: Specialist

## 2013-10-22 DIAGNOSIS — T148XXA Other injury of unspecified body region, initial encounter: Secondary | ICD-10-CM | POA: Diagnosis not present

## 2013-10-22 DIAGNOSIS — M549 Dorsalgia, unspecified: Secondary | ICD-10-CM | POA: Diagnosis not present

## 2013-10-22 DIAGNOSIS — G8929 Other chronic pain: Secondary | ICD-10-CM | POA: Diagnosis present

## 2013-10-22 DIAGNOSIS — K219 Gastro-esophageal reflux disease without esophagitis: Secondary | ICD-10-CM | POA: Diagnosis not present

## 2013-10-22 DIAGNOSIS — Z853 Personal history of malignant neoplasm of breast: Secondary | ICD-10-CM | POA: Diagnosis not present

## 2013-10-22 DIAGNOSIS — E039 Hypothyroidism, unspecified: Secondary | ICD-10-CM | POA: Diagnosis not present

## 2013-10-22 DIAGNOSIS — E785 Hyperlipidemia, unspecified: Secondary | ICD-10-CM | POA: Diagnosis present

## 2013-10-22 DIAGNOSIS — M545 Low back pain, unspecified: Secondary | ICD-10-CM | POA: Diagnosis not present

## 2013-10-22 DIAGNOSIS — I10 Essential (primary) hypertension: Secondary | ICD-10-CM | POA: Diagnosis present

## 2013-10-22 DIAGNOSIS — S32009A Unspecified fracture of unspecified lumbar vertebra, initial encounter for closed fracture: Secondary | ICD-10-CM | POA: Diagnosis not present

## 2013-10-22 DIAGNOSIS — M810 Age-related osteoporosis without current pathological fracture: Secondary | ICD-10-CM | POA: Diagnosis not present

## 2013-10-22 DIAGNOSIS — S3981XA Other specified injuries of abdomen, initial encounter: Secondary | ICD-10-CM | POA: Diagnosis not present

## 2013-10-22 DIAGNOSIS — M8448XA Pathological fracture, other site, initial encounter for fracture: Secondary | ICD-10-CM | POA: Diagnosis not present

## 2013-10-22 DIAGNOSIS — M5137 Other intervertebral disc degeneration, lumbosacral region: Secondary | ICD-10-CM | POA: Diagnosis present

## 2013-10-22 DIAGNOSIS — Z7982 Long term (current) use of aspirin: Secondary | ICD-10-CM | POA: Diagnosis not present

## 2013-10-22 DIAGNOSIS — I2510 Atherosclerotic heart disease of native coronary artery without angina pectoris: Secondary | ICD-10-CM | POA: Diagnosis present

## 2013-10-22 DIAGNOSIS — Z951 Presence of aortocoronary bypass graft: Secondary | ICD-10-CM | POA: Diagnosis not present

## 2013-10-22 LAB — CBC WITH DIFFERENTIAL/PLATELET
Basophil #: 0 10*3/uL (ref 0.0–0.1)
Basophil %: 0.2 %
Eosinophil #: 0.2 10*3/uL (ref 0.0–0.7)
Eosinophil %: 2.3 %
HCT: 36.2 % (ref 35.0–47.0)
HGB: 12.3 g/dL (ref 12.0–16.0)
Lymphocyte #: 1.6 10*3/uL (ref 1.0–3.6)
Lymphocyte %: 19.3 %
MCH: 30.4 pg (ref 26.0–34.0)
MCHC: 34 g/dL (ref 32.0–36.0)
MCV: 90 fL (ref 80–100)
MONOS PCT: 7.8 %
Monocyte #: 0.6 x10 3/mm (ref 0.2–0.9)
NEUTROS ABS: 5.9 10*3/uL (ref 1.4–6.5)
Neutrophil %: 70.4 %
Platelet: 259 10*3/uL (ref 150–440)
RBC: 4.04 10*6/uL (ref 3.80–5.20)
RDW: 13.6 % (ref 11.5–14.5)
WBC: 8.4 10*3/uL (ref 3.6–11.0)

## 2013-10-22 LAB — COMPREHENSIVE METABOLIC PANEL
ALK PHOS: 129 U/L — AB
AST: 62 U/L — AB (ref 15–37)
Albumin: 3.2 g/dL — ABNORMAL LOW (ref 3.4–5.0)
Anion Gap: 8 (ref 7–16)
BUN: 23 mg/dL — AB (ref 7–18)
Bilirubin,Total: 0.3 mg/dL (ref 0.2–1.0)
Calcium, Total: 9.2 mg/dL (ref 8.5–10.1)
Chloride: 106 mmol/L (ref 98–107)
Co2: 27 mmol/L (ref 21–32)
Creatinine: 0.87 mg/dL (ref 0.60–1.30)
EGFR (Non-African Amer.): >60
GLUCOSE: 105 mg/dL — AB (ref 65–99)
Osmolality: 285 (ref 275–301)
POTASSIUM: 3.5 mmol/L (ref 3.5–5.1)
SGPT (ALT): 71 U/L (ref 12–78)
SODIUM: 141 mmol/L (ref 136–145)
TOTAL PROTEIN: 6.4 g/dL (ref 6.4–8.2)

## 2013-10-22 LAB — URINALYSIS, COMPLETE
Bacteria: NONE SEEN
Bilirubin,UR: NEGATIVE
Blood: NEGATIVE
Glucose,UR: NEGATIVE mg/dL (ref 0–75)
Ketone: NEGATIVE
Leukocyte Esterase: NEGATIVE
Nitrite: NEGATIVE
Ph: 6 (ref 4.5–8.0)
Protein: NEGATIVE
Specific Gravity: 1.006 (ref 1.003–1.030)
Squamous Epithelial: NONE SEEN

## 2013-10-23 ENCOUNTER — Telehealth: Payer: Self-pay | Admitting: Internal Medicine

## 2013-10-23 LAB — CBC WITH DIFFERENTIAL/PLATELET
BASOS ABS: 0 10*3/uL (ref 0.0–0.1)
Basophil %: 0.4 %
EOS ABS: 0.3 10*3/uL (ref 0.0–0.7)
Eosinophil %: 4 %
HCT: 33.4 % — AB (ref 35.0–47.0)
HGB: 11.3 g/dL — AB (ref 12.0–16.0)
LYMPHS PCT: 29.4 %
Lymphocyte #: 2 10*3/uL (ref 1.0–3.6)
MCH: 30.3 pg (ref 26.0–34.0)
MCHC: 33.7 g/dL (ref 32.0–36.0)
MCV: 90 fL (ref 80–100)
MONOS PCT: 9.2 %
Monocyte #: 0.6 x10 3/mm (ref 0.2–0.9)
NEUTROS ABS: 3.8 10*3/uL (ref 1.4–6.5)
Neutrophil %: 57 %
Platelet: 222 10*3/uL (ref 150–440)
RBC: 3.72 10*6/uL — ABNORMAL LOW (ref 3.80–5.20)
RDW: 14 % (ref 11.5–14.5)
WBC: 6.7 10*3/uL (ref 3.6–11.0)

## 2013-10-23 LAB — BASIC METABOLIC PANEL
ANION GAP: 7 (ref 7–16)
BUN: 20 mg/dL — ABNORMAL HIGH (ref 7–18)
CALCIUM: 8.3 mg/dL — AB (ref 8.5–10.1)
CO2: 25 mmol/L (ref 21–32)
Chloride: 110 mmol/L — ABNORMAL HIGH (ref 98–107)
Creatinine: 1 mg/dL (ref 0.60–1.30)
EGFR (African American): 59 — ABNORMAL LOW
GFR CALC NON AF AMER: 51 — AB
Glucose: 86 mg/dL (ref 65–99)
Osmolality: 285 (ref 275–301)
Potassium: 4 mmol/L (ref 3.5–5.1)
Sodium: 142 mmol/L (ref 136–145)

## 2013-10-23 NOTE — Telephone Encounter (Signed)
FYI

## 2013-10-23 NOTE — Telephone Encounter (Signed)
[facility] called to report pt did not show for her MRI scheduled 6/25.

## 2013-10-24 LAB — PATHOLOGY REPORT

## 2013-10-24 NOTE — Telephone Encounter (Signed)
Please call pt and see why she did not show for appt.  Make sure pt doing ok.

## 2013-10-24 NOTE — Telephone Encounter (Signed)
Need hospital records.  Thanks.

## 2013-10-24 NOTE — Telephone Encounter (Signed)
Records requested

## 2013-10-24 NOTE — Telephone Encounter (Signed)
Pt has CPE on 11/28/13 @ 1:30 but she mentioned that she has "alot" that she wants to talk about.

## 2013-10-24 NOTE — Telephone Encounter (Signed)
Pt states that she has been in the hospital & had back surgery. Had was done & thought that the MRI was cancelled. Pt states that she has an upcoming appt here & will discuss this with you then.

## 2013-10-24 NOTE — Telephone Encounter (Signed)
See attached

## 2013-10-25 DIAGNOSIS — Z9181 History of falling: Secondary | ICD-10-CM | POA: Diagnosis not present

## 2013-10-25 DIAGNOSIS — IMO0001 Reserved for inherently not codable concepts without codable children: Secondary | ICD-10-CM | POA: Diagnosis not present

## 2013-10-25 DIAGNOSIS — IMO0002 Reserved for concepts with insufficient information to code with codable children: Secondary | ICD-10-CM | POA: Diagnosis not present

## 2013-10-25 DIAGNOSIS — M810 Age-related osteoporosis without current pathological fracture: Secondary | ICD-10-CM | POA: Diagnosis not present

## 2013-10-25 DIAGNOSIS — M48061 Spinal stenosis, lumbar region without neurogenic claudication: Secondary | ICD-10-CM | POA: Diagnosis not present

## 2013-10-25 DIAGNOSIS — I2510 Atherosclerotic heart disease of native coronary artery without angina pectoris: Secondary | ICD-10-CM | POA: Diagnosis not present

## 2013-10-26 ENCOUNTER — Encounter: Payer: Self-pay | Admitting: Internal Medicine

## 2013-10-26 NOTE — Assessment & Plan Note (Signed)
Persistent increased back pain.  Recent xray revealed multilevel disc disease with stable moderate compression fracture s/p kyphoplasty of L3 and mild compression deformity of L4 - age indeterminate.  Discussed with her today.  She has taken norco previously and tolerated.  Unable to take increased antiinflammatories.  alleve and tylenol not helping.  Prescribe norco as directed.  Check MRI L-S spine.  Further w/up pending.  She has an appt with ortho (but not until 7/15).

## 2013-10-28 DIAGNOSIS — M810 Age-related osteoporosis without current pathological fracture: Secondary | ICD-10-CM | POA: Diagnosis not present

## 2013-10-28 DIAGNOSIS — I2510 Atherosclerotic heart disease of native coronary artery without angina pectoris: Secondary | ICD-10-CM | POA: Diagnosis not present

## 2013-10-28 DIAGNOSIS — IMO0001 Reserved for inherently not codable concepts without codable children: Secondary | ICD-10-CM | POA: Diagnosis not present

## 2013-10-28 DIAGNOSIS — Z9181 History of falling: Secondary | ICD-10-CM | POA: Diagnosis not present

## 2013-10-28 DIAGNOSIS — M48061 Spinal stenosis, lumbar region without neurogenic claudication: Secondary | ICD-10-CM | POA: Diagnosis not present

## 2013-10-28 DIAGNOSIS — IMO0002 Reserved for concepts with insufficient information to code with codable children: Secondary | ICD-10-CM | POA: Diagnosis not present

## 2013-11-03 ENCOUNTER — Telehealth: Payer: Self-pay | Admitting: Internal Medicine

## 2013-11-03 NOTE — Telephone Encounter (Signed)
Pt states she had back surgery and was told to f/u in 1-2 weeks.  Prefers p.m. Appt.  None available until late August.  Please advise.

## 2013-11-03 NOTE — Telephone Encounter (Signed)
I can see her 11/18/13 at 11:45.  Please schedule.

## 2013-11-04 DIAGNOSIS — M48061 Spinal stenosis, lumbar region without neurogenic claudication: Secondary | ICD-10-CM | POA: Diagnosis not present

## 2013-11-04 DIAGNOSIS — I2510 Atherosclerotic heart disease of native coronary artery without angina pectoris: Secondary | ICD-10-CM | POA: Diagnosis not present

## 2013-11-04 DIAGNOSIS — IMO0001 Reserved for inherently not codable concepts without codable children: Secondary | ICD-10-CM | POA: Diagnosis not present

## 2013-11-04 DIAGNOSIS — IMO0002 Reserved for concepts with insufficient information to code with codable children: Secondary | ICD-10-CM | POA: Diagnosis not present

## 2013-11-04 DIAGNOSIS — Z9181 History of falling: Secondary | ICD-10-CM | POA: Diagnosis not present

## 2013-11-04 DIAGNOSIS — M810 Age-related osteoporosis without current pathological fracture: Secondary | ICD-10-CM | POA: Diagnosis not present

## 2013-11-06 DIAGNOSIS — Z9181 History of falling: Secondary | ICD-10-CM | POA: Diagnosis not present

## 2013-11-06 DIAGNOSIS — IMO0001 Reserved for inherently not codable concepts without codable children: Secondary | ICD-10-CM | POA: Diagnosis not present

## 2013-11-06 DIAGNOSIS — I2510 Atherosclerotic heart disease of native coronary artery without angina pectoris: Secondary | ICD-10-CM | POA: Diagnosis not present

## 2013-11-06 DIAGNOSIS — M810 Age-related osteoporosis without current pathological fracture: Secondary | ICD-10-CM | POA: Diagnosis not present

## 2013-11-06 DIAGNOSIS — IMO0002 Reserved for concepts with insufficient information to code with codable children: Secondary | ICD-10-CM | POA: Diagnosis not present

## 2013-11-06 DIAGNOSIS — M48061 Spinal stenosis, lumbar region without neurogenic claudication: Secondary | ICD-10-CM | POA: Diagnosis not present

## 2013-11-06 NOTE — Telephone Encounter (Signed)
Pt agrees.  Scheduled.

## 2013-11-11 DIAGNOSIS — Z9181 History of falling: Secondary | ICD-10-CM | POA: Diagnosis not present

## 2013-11-11 DIAGNOSIS — IMO0001 Reserved for inherently not codable concepts without codable children: Secondary | ICD-10-CM | POA: Diagnosis not present

## 2013-11-11 DIAGNOSIS — M48061 Spinal stenosis, lumbar region without neurogenic claudication: Secondary | ICD-10-CM | POA: Diagnosis not present

## 2013-11-11 DIAGNOSIS — IMO0002 Reserved for concepts with insufficient information to code with codable children: Secondary | ICD-10-CM | POA: Diagnosis not present

## 2013-11-11 DIAGNOSIS — M810 Age-related osteoporosis without current pathological fracture: Secondary | ICD-10-CM | POA: Diagnosis not present

## 2013-11-11 DIAGNOSIS — I2510 Atherosclerotic heart disease of native coronary artery without angina pectoris: Secondary | ICD-10-CM | POA: Diagnosis not present

## 2013-11-15 DIAGNOSIS — IMO0002 Reserved for concepts with insufficient information to code with codable children: Secondary | ICD-10-CM | POA: Diagnosis not present

## 2013-11-15 DIAGNOSIS — I2510 Atherosclerotic heart disease of native coronary artery without angina pectoris: Secondary | ICD-10-CM | POA: Diagnosis not present

## 2013-11-15 DIAGNOSIS — M810 Age-related osteoporosis without current pathological fracture: Secondary | ICD-10-CM | POA: Diagnosis not present

## 2013-11-15 DIAGNOSIS — M48061 Spinal stenosis, lumbar region without neurogenic claudication: Secondary | ICD-10-CM | POA: Diagnosis not present

## 2013-11-15 DIAGNOSIS — Z9181 History of falling: Secondary | ICD-10-CM | POA: Diagnosis not present

## 2013-11-15 DIAGNOSIS — IMO0001 Reserved for inherently not codable concepts without codable children: Secondary | ICD-10-CM | POA: Diagnosis not present

## 2013-11-17 ENCOUNTER — Encounter: Payer: Self-pay | Admitting: Internal Medicine

## 2013-11-17 DIAGNOSIS — IMO0001 Reserved for inherently not codable concepts without codable children: Secondary | ICD-10-CM | POA: Diagnosis not present

## 2013-11-17 DIAGNOSIS — IMO0002 Reserved for concepts with insufficient information to code with codable children: Secondary | ICD-10-CM | POA: Diagnosis not present

## 2013-11-17 DIAGNOSIS — I2510 Atherosclerotic heart disease of native coronary artery without angina pectoris: Secondary | ICD-10-CM | POA: Diagnosis not present

## 2013-11-17 DIAGNOSIS — M48061 Spinal stenosis, lumbar region without neurogenic claudication: Secondary | ICD-10-CM | POA: Diagnosis not present

## 2013-11-17 DIAGNOSIS — Z9181 History of falling: Secondary | ICD-10-CM | POA: Diagnosis not present

## 2013-11-17 DIAGNOSIS — M810 Age-related osteoporosis without current pathological fracture: Secondary | ICD-10-CM | POA: Diagnosis not present

## 2013-11-18 ENCOUNTER — Ambulatory Visit (INDEPENDENT_AMBULATORY_CARE_PROVIDER_SITE_OTHER): Payer: Medicare Other | Admitting: Internal Medicine

## 2013-11-18 ENCOUNTER — Encounter: Payer: Self-pay | Admitting: Internal Medicine

## 2013-11-18 VITALS — BP 138/70 | HR 100 | Temp 98.3°F | Ht 64.0 in | Wt 133.2 lb

## 2013-11-18 DIAGNOSIS — I2510 Atherosclerotic heart disease of native coronary artery without angina pectoris: Secondary | ICD-10-CM

## 2013-11-18 DIAGNOSIS — M545 Low back pain, unspecified: Secondary | ICD-10-CM

## 2013-11-18 DIAGNOSIS — I10 Essential (primary) hypertension: Secondary | ICD-10-CM

## 2013-11-18 DIAGNOSIS — C50919 Malignant neoplasm of unspecified site of unspecified female breast: Secondary | ICD-10-CM

## 2013-11-18 DIAGNOSIS — I951 Orthostatic hypotension: Secondary | ICD-10-CM

## 2013-11-18 DIAGNOSIS — E039 Hypothyroidism, unspecified: Secondary | ICD-10-CM

## 2013-11-18 NOTE — Progress Notes (Signed)
Pre visit review using our clinic review tool, if applicable. No additional management support is needed unless otherwise documented below in the visit note. 

## 2013-11-19 ENCOUNTER — Encounter: Payer: Self-pay | Admitting: Internal Medicine

## 2013-11-19 DIAGNOSIS — I2510 Atherosclerotic heart disease of native coronary artery without angina pectoris: Secondary | ICD-10-CM | POA: Diagnosis not present

## 2013-11-19 DIAGNOSIS — Z9181 History of falling: Secondary | ICD-10-CM | POA: Diagnosis not present

## 2013-11-19 DIAGNOSIS — M48061 Spinal stenosis, lumbar region without neurogenic claudication: Secondary | ICD-10-CM | POA: Diagnosis not present

## 2013-11-19 DIAGNOSIS — IMO0002 Reserved for concepts with insufficient information to code with codable children: Secondary | ICD-10-CM | POA: Diagnosis not present

## 2013-11-19 DIAGNOSIS — M810 Age-related osteoporosis without current pathological fracture: Secondary | ICD-10-CM | POA: Diagnosis not present

## 2013-11-19 DIAGNOSIS — IMO0001 Reserved for inherently not codable concepts without codable children: Secondary | ICD-10-CM | POA: Diagnosis not present

## 2013-11-19 NOTE — Progress Notes (Signed)
Subjective:    Patient ID: Angela Howe, female    DOB: 08/26/1925, 80 y.o.   MRN: [id number]  Back Pain  80 year old female with past history of CAD s/p CABG followed by Dr Rockey Situ, hypercholesterolemia and orthostatic hypotension.  She comes in today for a scheduled follow up.  Was recently evaluated for back pain.  Pain worsened and went to ER.  Is now s/p back surgery (kyphoplasty).  Performed by Dr Rudene Christians.  That back pain is better.  Currently receiving physical therapy.  Doing her exercise.  Taking alleve.  She is now having some increased low back pain.  No pain radiating down her leg.  No numbness or tingling.  Does report some right lower quadrant pain.  Started after her surgery.  No pain with eating .  No change in her bowels.  No other abdominal pain or cramping.    No chest pain or tightness.  No sob.  No nausea or vomiting.  She is on florinef.   Wearing compression stockings.  Blood pressure has been better since doing this.     Past Medical History  Diagnosis Date  . CAD (coronary artery disease)   . Orthostatic hypotension   . Syncope   . Breast cancer   . GERD (gastroesophageal reflux disease)   . Osteoarthritis   . Degenerative disc disease   . Hypothyroidism   . Hyperlipidemia     Previous intolerance of statins  . Peptic ulcer disease     Current Outpatient Prescriptions on File Prior to Visit  Medication Sig Dispense Refill  . acetaminophen (TYLENOL) 650 MG CR tablet Take 650 mg by mouth every 8 (eight) hours as needed.        Marland Kitchen amitriptyline (ELAVIL) 50 MG tablet Take 1 tablet (50 mg total) by mouth at bedtime.  90 tablet  1  . aspirin 81 MG EC tablet Take 81 mg by mouth daily.        Marland Kitchen atorvastatin (LIPITOR) 20 MG tablet Take 1 tablet (20 mg total) by mouth daily.  90 tablet  3  . Calcium-Vitamin D 600-200 MG-UNIT per tablet Take 1 tablet by mouth 2 (two) times daily.        . citalopram (CELEXA) 20 MG tablet Take 1 tablet (20 mg total) by mouth daily.  90 tablet  3   . fludrocortisone (FLORINEF) 0.1 MG tablet Take 0.1 mg by mouth daily.      Marland Kitchen HYDROcodone-acetaminophen (NORCO) 5-325 MG per tablet Take 1 tablet by mouth 2 (two) times daily as needed for moderate pain.  30 tablet  0  . lansoprazole (PREVACID) 30 MG capsule Take 30 mg by mouth daily.        Marland Kitchen levothyroxine (SYNTHROID, LEVOTHROID) 50 MCG tablet Take 1 tablet (50 mcg total) by mouth daily.  90 tablet  3  . midodrine (PROAMATINE) 5 MG tablet Take 1 tablet (5 mg total) by mouth 3 (three) times daily as needed.  90 tablet  6  . naproxen sodium (ANAPROX) 220 MG tablet Take 220 mg by mouth 2 (two) times daily with a meal.      . nitroGLYCERIN (NITROSTAT) 0.4 MG SL tablet Place 0.4 mg under the tongue every 5 (five) minutes as needed for chest pain.       No current facility-administered medications on file prior to visit.    Review of Systems  Musculoskeletal: Positive for back pain.  Patient denies any headache, lightheadedness or dizziness.  No sinus  congestion or drainage.  No chest pain, tightness or palpitations. No increased shortness of breath, cough or congestion.  No nausea or vomiting.  No acid reflux.  Some right lower quadrante pain.  No other abdominal pain or cramping.  No bowel change.  Increased back pain as outlined.  No pain radiating down her leg.      Did well with her surgery.  Receiving physical therapy now.       Objective:   Physical Exam  Filed Vitals:   11/18/13 1152  BP: 138/70  Pulse: 100  Temp: 98.3 F (3.65 C)   80 year old female in no acute distress.  HEENT:  Nares clear.  Oropharynx:  No lesions.  NECK:  Supple.  Nontender.  No audible bruit.  HEART:  Appears to be regular. LUNGS:  No crackles or wheezing audible.  Respirations even and unlabored.  RADIAL PULSE:  Equal bilaterally.  ABDOMEN:  Soft, nontender.  Bowel sounds present and normal.  No audible abdominal bruit.  MSK:  Low back pain with going from seated to standing position.  Better once up and  walking.         Assessment & Plan:  HEALTH MAINTENANCE.  Physical 11/19/12.  Had her mammogram in 5/14.  Birads 4.  Saw Dr Felton Clinton.  Biopsy negative.  Had f/u mammogram 09/10/13 - Birads I.   I spent 25 minutes with the patient and more than 50% of the time was spent in consultation regarding the above.

## 2013-11-19 NOTE — Assessment & Plan Note (Signed)
Has a known history of breast cancer.  Recent mammogram Birads 4.  Saw Dr Felton Clinton.  S/p biopsy.  Biopsy negative.  Had follow up appt with Dr Felton Clinton 03/02/13.  See that note for details.  Follow up mammogram 09/10/13 - Birads I.

## 2013-11-19 NOTE — Assessment & Plan Note (Signed)
S/p recent intervention - Dr Rudene Christians.  The severe pain improved.  Now with low back pain.  MRI recently performed.  See dictated report.  Continue physical therapy and exercises as she is doing.  Low back pain is limiting her activity. Has to stop and rest.  Will refer to Dr Sharlet Salina for further evaluation and help with pain.

## 2013-11-19 NOTE — Assessment & Plan Note (Signed)
Blood pressure as outlined. Stable.  Follow.

## 2013-11-19 NOTE — Assessment & Plan Note (Signed)
No chest pain or tightness.  Follow.   Continues to follow up with Dr Rockey Situ.

## 2013-11-19 NOTE — Assessment & Plan Note (Signed)
On thyroid replacement.  Follow tsh.  

## 2013-11-19 NOTE — Assessment & Plan Note (Signed)
Blood pressure as outlined.  On florinef and midodrine.  Follow.

## 2013-11-28 ENCOUNTER — Encounter: Payer: Self-pay | Admitting: Internal Medicine

## 2013-11-28 ENCOUNTER — Encounter: Payer: Medicare Other | Admitting: Internal Medicine

## 2014-01-07 DIAGNOSIS — H353190 Nonexudative age-related macular degeneration, unspecified eye, stage unspecified: Secondary | ICD-10-CM | POA: Diagnosis not present

## 2014-01-15 DIAGNOSIS — Z23 Encounter for immunization: Secondary | ICD-10-CM | POA: Diagnosis not present

## 2014-01-29 ENCOUNTER — Ambulatory Visit (INDEPENDENT_AMBULATORY_CARE_PROVIDER_SITE_OTHER): Payer: Medicare Other | Admitting: Internal Medicine

## 2014-01-29 ENCOUNTER — Encounter: Payer: Self-pay | Admitting: Internal Medicine

## 2014-01-29 VITALS — BP 140/80 | HR 89 | Temp 98.2°F | Ht 63.0 in | Wt 133.8 lb

## 2014-01-29 DIAGNOSIS — I10 Essential (primary) hypertension: Secondary | ICD-10-CM | POA: Diagnosis not present

## 2014-01-29 DIAGNOSIS — I2510 Atherosclerotic heart disease of native coronary artery without angina pectoris: Secondary | ICD-10-CM | POA: Diagnosis not present

## 2014-01-29 DIAGNOSIS — M545 Low back pain: Secondary | ICD-10-CM | POA: Diagnosis not present

## 2014-01-29 DIAGNOSIS — E7800 Pure hypercholesterolemia, unspecified: Secondary | ICD-10-CM

## 2014-01-29 DIAGNOSIS — E039 Hypothyroidism, unspecified: Secondary | ICD-10-CM

## 2014-01-29 DIAGNOSIS — I951 Orthostatic hypotension: Secondary | ICD-10-CM

## 2014-01-29 DIAGNOSIS — C50919 Malignant neoplasm of unspecified site of unspecified female breast: Secondary | ICD-10-CM | POA: Diagnosis not present

## 2014-01-29 NOTE — Progress Notes (Signed)
Pre visit review using our clinic review tool, if applicable. No additional management support is needed unless otherwise documented below in the visit note. 

## 2014-02-01 ENCOUNTER — Encounter: Payer: Self-pay | Admitting: Internal Medicine

## 2014-02-01 NOTE — Assessment & Plan Note (Signed)
No chest pain or tightness.  Follow.   Continues to follow up with Dr Rockey Situ.

## 2014-02-01 NOTE — Assessment & Plan Note (Signed)
On thyroid replacement.  Follow tsh.  

## 2014-02-01 NOTE — Assessment & Plan Note (Signed)
S/p recent intervention - Dr Rudene Christians (s/p kyphoplasty).  That pain improved, but is now with persistent low back pain and pain radiating down her right leg.   MRI recently performed.  See dictated report.  Is s/p physical therapy.  Doing exercises at home.   Low back pain is limiting her activity. Has to stop and rest.  Will refer to Dr Sharlet Salina for further evaluation and help with pain.  She was questioning if injection would help.

## 2014-02-01 NOTE — Assessment & Plan Note (Signed)
Remains on Lipitor.  On 20mg .  Follow lipid panel and liver function.

## 2014-02-01 NOTE — Assessment & Plan Note (Signed)
Blood pressure as outlined. Stable.  Follow.

## 2014-02-01 NOTE — Progress Notes (Signed)
Subjective:    Patient ID: Angela Howe, female    DOB: [birth year], 80 y.o.   MRN: [id number]  Back Pain  80 year old female with past history of CAD s/p CABG followed by Dr Rockey Situ, hypercholesterolemia and orthostatic hypotension.  She comes in today for a scheduled follow up.  Was having increased back pain and was found to have L4 compression fracture.  Is now s/p kypohplasty  performed by Dr Rudene Christians.  That back pain is better.  Had physical therapy.  Doing her exercise.  Taking alleve.  She is now having some increased low back pain and pain radiating down her right leg.  No numbness or tingling.  Would like evaluation for possible injection.  No abdominal pain.   No pain with eating .  No change in her bowels.  No chest pain or tightness.  No sob.  No nausea or vomiting.  She is on florinef.   Wearing compression stockings.  Blood pressure has not been low since starting this regimen.  No further syncope or near syncopal episodes.     Past Medical History  Diagnosis Date  . CAD (coronary artery disease)   . Orthostatic hypotension   . Syncope   . Breast cancer   . GERD (gastroesophageal reflux disease)   . Osteoarthritis   . Degenerative disc disease   . Hypothyroidism   . Hyperlipidemia     Previous intolerance of statins  . Peptic ulcer disease     Current Outpatient Prescriptions on File Prior to Visit  Medication Sig Dispense Refill  . acetaminophen (TYLENOL) 650 MG CR tablet Take 650 mg by mouth every 8 (eight) hours as needed.        Marland Kitchen amitriptyline (ELAVIL) 50 MG tablet Take 1 tablet (50 mg total) by mouth at bedtime.  90 tablet  1  . aspirin 81 MG EC tablet Take 81 mg by mouth daily.        Marland Kitchen atorvastatin (LIPITOR) 20 MG tablet Take 1 tablet (20 mg total) by mouth daily.  90 tablet  3  . Calcium-Vitamin D 600-200 MG-UNIT per tablet Take 1 tablet by mouth 2 (two) times daily.        . citalopram (CELEXA) 20 MG tablet Take 1 tablet (20 mg total) by mouth daily.  90 tablet  3  .  fludrocortisone (FLORINEF) 0.1 MG tablet Take 0.1 mg by mouth daily.      . lansoprazole (PREVACID) 30 MG capsule Take 30 mg by mouth daily.        Marland Kitchen levothyroxine (SYNTHROID, LEVOTHROID) 50 MCG tablet Take 1 tablet (50 mcg total) by mouth daily.  90 tablet  3  . nitroGLYCERIN (NITROSTAT) 0.4 MG SL tablet Place 0.4 mg under the tongue every 5 (five) minutes as needed for chest pain.      . naproxen sodium (ANAPROX) 220 MG tablet Take 220 mg by mouth 2 (two) times daily with a meal.       No current facility-administered medications on file prior to visit.    Review of Systems  Musculoskeletal: Positive for back pain.  Patient denies any headache, lightheadedness or dizziness.  No sinus congestion or drainage.  No chest pain, tightness or palpitations. No increased shortness of breath, cough or congestion.  No nausea or vomiting.  No acid reflux.  No abdominal pain or cramping.  No bowel change.  Increased back pain as outlined.  Low back pain and pain radiating down the right leg.  Did well with her surgery.  Is s/p physical therapy.  Wants evaluation for possible injection.       Objective:   Physical Exam  Filed Vitals:   01/29/14 1335  BP: 140/80  Pulse: 89  Temp: 98.2 F (36.8 C)   Blood pressure recheck:  54/71  80 year old female in no acute distress.  HEENT:  Nares clear.  Oropharynx:  No lesions.  NECK:  Supple.  Nontender.  No audible bruit.  HEART:  Appears to be regular. LUNGS:  No crackles or wheezing audible.  Respirations even and unlabored.  RADIAL PULSE:  Equal bilaterally.  ABDOMEN:  Soft, nontender.  Bowel sounds present and normal.  No audible abdominal bruit.  MSK:  Low back pain with going from seated to standing position.  Better once up and walking.         Assessment & Plan:  HEALTH MAINTENANCE.  Physical 11/19/12.  Had her mammogram in 5/14.  Birads 4.  Saw Dr Felton Clinton.  Biopsy negative.  Had f/u mammogram 09/10/13 - Birads I.  Schedule physical for next  visit.   I spent 25 minutes with the patient and more than 50% of the time was spent in consultation regarding the above.

## 2014-02-01 NOTE — Assessment & Plan Note (Signed)
Blood pressure as outlined.  On florinef and midodrine.  Follow.  No recent episodes.

## 2014-02-01 NOTE — Assessment & Plan Note (Signed)
Has a known history of breast cancer.  2014 mammogram Birads 4.  Saw Dr Felton Clinton.  S/p biopsy.  Biopsy negative.  Had follow up appt with Dr Felton Clinton 03/02/13.  See that note for details.  Follow up mammogram 09/10/13 - Birads I.

## 2014-02-04 DIAGNOSIS — H04123 Dry eye syndrome of bilateral lacrimal glands: Secondary | ICD-10-CM | POA: Diagnosis not present

## 2014-02-06 ENCOUNTER — Other Ambulatory Visit (INDEPENDENT_AMBULATORY_CARE_PROVIDER_SITE_OTHER): Payer: Medicare Other

## 2014-02-06 DIAGNOSIS — E7800 Pure hypercholesterolemia, unspecified: Secondary | ICD-10-CM

## 2014-02-06 DIAGNOSIS — I10 Essential (primary) hypertension: Secondary | ICD-10-CM | POA: Diagnosis not present

## 2014-02-06 LAB — LIPID PANEL
Cholesterol: 131 mg/dL (ref 0–200)
HDL: 37.4 mg/dL — ABNORMAL LOW (ref >39.00)
LDL Cholesterol: 61 mg/dL (ref 0–99)
NONHDL: 93.6
Total CHOL/HDL Ratio: 4
Triglycerides: 162 mg/dL — ABNORMAL HIGH (ref 0.0–149.0)
VLDL: 32.4 mg/dL (ref 0.0–40.0)

## 2014-02-06 LAB — HEPATIC FUNCTION PANEL
ALT: 10 U/L (ref 0–35)
AST: 22 U/L (ref 0–37)
Albumin: 3 g/dL — ABNORMAL LOW (ref 3.5–5.2)
Alkaline Phosphatase: 82 U/L (ref 39–117)
BILIRUBIN DIRECT: 0.1 mg/dL (ref 0.0–0.3)
BILIRUBIN TOTAL: 0.6 mg/dL (ref 0.2–1.2)
Total Protein: 6.3 g/dL (ref 6.0–8.3)

## 2014-02-06 LAB — BASIC METABOLIC PANEL
BUN: 23 mg/dL (ref 6–23)
CALCIUM: 8.9 mg/dL (ref 8.4–10.5)
CO2: 27 meq/L (ref 19–32)
Chloride: 107 mEq/L (ref 96–112)
Creatinine, Ser: 1 mg/dL (ref 0.4–1.2)
GFR: 55.86 mL/min — AB (ref >60.00)
GLUCOSE: 91 mg/dL (ref 70–99)
Potassium: 4.3 mEq/L (ref 3.5–5.1)
SODIUM: 139 meq/L (ref 135–145)

## 2014-02-09 ENCOUNTER — Encounter: Payer: Self-pay | Admitting: *Deleted

## 2014-02-15 ENCOUNTER — Ambulatory Visit: Payer: Self-pay

## 2014-02-15 DIAGNOSIS — R42 Dizziness and giddiness: Secondary | ICD-10-CM | POA: Diagnosis not present

## 2014-02-15 DIAGNOSIS — Y92009 Unspecified place in unspecified non-institutional (private) residence as the place of occurrence of the external cause: Secondary | ICD-10-CM | POA: Diagnosis not present

## 2014-02-15 DIAGNOSIS — I951 Orthostatic hypotension: Secondary | ICD-10-CM | POA: Diagnosis not present

## 2014-02-15 DIAGNOSIS — Y939 Activity, unspecified: Secondary | ICD-10-CM | POA: Diagnosis not present

## 2014-02-15 DIAGNOSIS — S0083XA Contusion of other part of head, initial encounter: Secondary | ICD-10-CM | POA: Diagnosis not present

## 2014-02-15 DIAGNOSIS — W19XXXA Unspecified fall, initial encounter: Secondary | ICD-10-CM | POA: Diagnosis not present

## 2014-02-15 DIAGNOSIS — L03211 Cellulitis of face: Secondary | ICD-10-CM | POA: Diagnosis not present

## 2014-02-15 DIAGNOSIS — M810 Age-related osteoporosis without current pathological fracture: Secondary | ICD-10-CM | POA: Diagnosis not present

## 2014-02-15 DIAGNOSIS — S0003XA Contusion of scalp, initial encounter: Secondary | ICD-10-CM | POA: Diagnosis not present

## 2014-02-19 DIAGNOSIS — H109 Unspecified conjunctivitis: Secondary | ICD-10-CM | POA: Diagnosis not present

## 2014-02-23 DIAGNOSIS — M5416 Radiculopathy, lumbar region: Secondary | ICD-10-CM | POA: Diagnosis not present

## 2014-02-23 DIAGNOSIS — M5136 Other intervertebral disc degeneration, lumbar region: Secondary | ICD-10-CM | POA: Diagnosis not present

## 2014-03-19 DIAGNOSIS — M5416 Radiculopathy, lumbar region: Secondary | ICD-10-CM | POA: Diagnosis not present

## 2014-03-19 DIAGNOSIS — M5136 Other intervertebral disc degeneration, lumbar region: Secondary | ICD-10-CM | POA: Diagnosis not present

## 2014-04-06 ENCOUNTER — Other Ambulatory Visit: Payer: Self-pay | Admitting: Internal Medicine

## 2014-04-06 NOTE — Telephone Encounter (Signed)
Last refill 9.22.15, last OV 10.1.15.  Please advise refill

## 2014-04-07 NOTE — Telephone Encounter (Signed)
rx sent in for citalopram.

## 2014-04-13 ENCOUNTER — Encounter: Payer: Self-pay | Admitting: Cardiovascular Disease

## 2014-04-13 ENCOUNTER — Ambulatory Visit (INDEPENDENT_AMBULATORY_CARE_PROVIDER_SITE_OTHER): Payer: Medicare Other | Admitting: Cardiovascular Disease

## 2014-04-13 VITALS — BP 140/70 | HR 87 | Ht 64.5 in | Wt 126.5 lb

## 2014-04-13 DIAGNOSIS — R0602 Shortness of breath: Secondary | ICD-10-CM | POA: Diagnosis not present

## 2014-04-13 DIAGNOSIS — M545 Low back pain: Secondary | ICD-10-CM

## 2014-04-13 DIAGNOSIS — I951 Orthostatic hypotension: Secondary | ICD-10-CM | POA: Diagnosis not present

## 2014-04-13 DIAGNOSIS — I2510 Atherosclerotic heart disease of native coronary artery without angina pectoris: Secondary | ICD-10-CM | POA: Diagnosis not present

## 2014-04-13 DIAGNOSIS — E785 Hyperlipidemia, unspecified: Secondary | ICD-10-CM

## 2014-04-13 DIAGNOSIS — R55 Syncope and collapse: Secondary | ICD-10-CM

## 2014-04-13 NOTE — Assessment & Plan Note (Signed)
Currently with no symptoms of angina. No further workup at this time. Continue current medication regimen. 

## 2014-04-13 NOTE — Assessment & Plan Note (Signed)
No further episodes of syncope. No medication changes made

## 2014-04-13 NOTE — Assessment & Plan Note (Signed)
We have suggested that she continue on her Florinef and midodrine.

## 2014-04-13 NOTE — Progress Notes (Signed)
Patient ID: Angela Howe, female    DOB: [birth year], 80 y.o.   MRN: [id number]  HPI Comments: Angela Howe is a very pleasant 80 year old woman with history of coronary artery disease, status post bypass grafting in 2008. She also has a history of hypertension, hyperlipidemia, breast cancer status post left lumpectomy, and gastroesophageal reflux disease. maintained on Florinef and midodrine for orthostatic hypotension.  admission to the hospital on June 10 2010  for diaphoresis, nausea or, dizziness.  cardiac catheterization showed patent LIMA graft, vein graft to the diagonal, occluded vein graft to the PDA though the native RCA was patent, good LV function. She presents for routine followup of her coronary artery disease  In follow-up today, she reports that her biggest complaint is her back. She had recent cortisone shots but continues to have severe back pain. She has not been walking much outside the house secondary to symptoms. No recent episodes of near syncope or syncope. She does not go outside the house much secondary to back pain. She's not been at church for 10 weeks given her back pain symptoms Her current walker is very heavy and she is unable to travel with this. She reports no recent falls but she finds her legs are weak and given her back pain, feels she is high risk of falling and injury. She does not need a wheelchair at this time In followup today, she is taking Florinef on weekdays, midodrine twice a day every day No further episodes of feeling hot or sweaty  EKG on today's visit shows normal sinus rhythm with rate 87 bpm, no significant ST or T-wave changes Total cholesterol 131  Prior episode of syncope. She was at [employer] in the checkout when she felt hot, sweaty then had loss of consciousness. She reports it was only for a short period of time. She also reported additional episodes of diaphoresis, feeling hot and sweaty with near syncope. These occurring over   several weeks. On a prior visit,, her blood pressure was 62/40. She was feeling hot, had to sit down . Recheck of her blood pressure was 82/40 on the right. Some time later recheck her blood pressure by myself was 664 systolic and she was feeling better  She does report having a fall several weeks ago. She fell forward, landed on her stomach. Since then she has had severe low back pain radiating bilaterally across the lower lumbar region. She had back x-rays Sep 24 2013 when she fell, repeat x-rays 10/02/2013. Most recent June x-ray suggesting moderate compression fracture of L3. Prior L4 kyphoplasty  History of chronic severe knee pain and neuropathy. She was seen by Angela Howe and told that she needed a hip replacement  Previous episode of chest pain at the end of August 2013. Stress test was ordered to rule out ischemia. This showed anterior wall defect consistent with breast attenuation artifact, normal ejection fraction, unable to exclude mild apical ischemia    Allergies  Allergen Reactions  . Alendronate Sodium   . Prednisone     Insomnia, "makes me crazy"    Outpatient Encounter Prescriptions as of 04/13/2014  Medication Sig  . acetaminophen (TYLENOL) 650 MG CR tablet Take 650 mg by mouth every 8 (eight) hours as needed.    Marland Kitchen amitriptyline (ELAVIL) 50 MG tablet Take 1 tablet (50 mg total) by mouth at bedtime.  Marland Kitchen amitriptyline (ELAVIL) 50 MG tablet TAKE ONE TABLET BY MOUTH EVERY NIGHT AT BEDTIME  . aspirin 81 MG EC tablet Take  81 mg by mouth daily.    Marland Kitchen atorvastatin (LIPITOR) 20 MG tablet Take 1 tablet (20 mg total) by mouth daily.  . Calcium-Vitamin D 600-200 MG-UNIT per tablet Take 1 tablet by mouth 2 (two) times daily.    . citalopram (CELEXA) 20 MG tablet TAKE 1 TABLET DAILY  . fludrocortisone (FLORINEF) 0.1 MG tablet Take 0.1 mg by mouth daily.  . lansoprazole (PREVACID) 30 MG capsule Take 30 mg by mouth daily.    . naproxen sodium (ANAPROX) 220 MG tablet Take 220 mg by  mouth 2 (two) times daily with a meal.  . nitroGLYCERIN (NITROSTAT) 0.4 MG SL tablet Place 0.4 mg under the tongue every 5 (five) minutes as needed for chest pain.  Marland Kitchen SYNTHROID 50 MCG tablet TAKE 1 TABLET DAILY    Past Medical History  Diagnosis Date  . CAD (coronary artery disease)   . Orthostatic hypotension   . Syncope   . Breast cancer   . GERD (gastroesophageal reflux disease)   . Osteoarthritis   . Degenerative disc disease   . Hypothyroidism   . Hyperlipidemia     Previous intolerance of statins  . Peptic ulcer disease     Past Surgical History  Procedure Laterality Date  . Breast lumpectomy  2006  . Abdominal hysterectomy  1970  . Nasal sinus surgery    . Rotator cuff repair    . Appendectomy    . Coronary artery bypass graft  05/2006    x3  . Cholecystectomy    . Biopsy breast  2014    Social History  reports that she has never smoked. She has never used smokeless tobacco. She reports that she does not drink alcohol or use illicit drugs.  Family History family history includes Heart attack in her sister; Pancreatic cancer in her brother.   Review of Systems  Constitutional: Negative.   Respiratory: Negative.   Cardiovascular: Negative.   Gastrointestinal: Negative.   Musculoskeletal: Positive for back pain, arthralgias and gait problem.  Skin: Negative.   Neurological: Negative.   Hematological: Negative.   Psychiatric/Behavioral: Negative.   All other systems reviewed and are negative.  BP 140/70 mmHg  Pulse 87  Ht 5' 4.5" (1.638 m)  Wt 126 lb 8 oz (57.38 kg)  BMI 21.39 kg/m2  Physical Exam  Constitutional: She is oriented to person, place, and time. She appears well-developed and well-nourished.  HENT:  Head: Normocephalic.  Nose: Nose normal.  Mouth/Throat: Oropharynx is clear and moist.  Eyes: Conjunctivae are normal. Pupils are equal, round, and reactive to light.  Neck: Normal range of motion. Neck supple. No JVD present.   Cardiovascular: Normal rate, regular rhythm, S1 normal, S2 normal, normal heart sounds and intact distal pulses.  Exam reveals no gallop and no friction rub.   No murmur heard. Pulmonary/Chest: Effort normal and breath sounds normal. No respiratory distress. She has no wheezes. She has no rales. She exhibits no tenderness.  Abdominal: Soft. Bowel sounds are normal. She exhibits no distension. There is no tenderness.  Musculoskeletal: Normal range of motion. She exhibits no edema or tenderness.  Lymphadenopathy:    She has no cervical adenopathy.  Neurological: She is alert and oriented to person, place, and time. Coordination normal.  Skin: Skin is warm and dry. No rash noted. No erythema.  Psychiatric: She has a normal mood and affect. Her behavior is normal. Judgment and thought content normal.    Assessment and Plan  Nursing note and vitals reviewed.

## 2014-04-13 NOTE — Assessment & Plan Note (Addendum)
Severe back pain, recent cortisone shots. Encouraged her to follow-up with her neurosurgeon or pain specialist She needs a walker, preferably one that is light weight, as she is a fall risk, has severe back pain, weak legs and is a high fall risk. Prescription has been provided to her today

## 2014-04-13 NOTE — Assessment & Plan Note (Signed)
Cholesterol is at goal on the current lipid regimen. No changes to the medications were made.  

## 2014-04-13 NOTE — Patient Instructions (Signed)
You are doing well. No medication changes were made.  We will order a light weight walker  Please call us if you have new issues that need to be addressed before your next appt.  Your physician wants you to follow-up in: 6 months.  You will receive a reminder letter in the mail two months in advance. If you don't receive a letter, please call our office to schedule the follow-up appointment.

## 2014-04-20 ENCOUNTER — Telehealth: Payer: Self-pay

## 2014-04-20 NOTE — Telephone Encounter (Signed)
Pt daughter called and asks is there anywhere else pt can get a lightweight walker that Dr. Rockey Situ ordered. [facility] does not have office hours and she would like to get one today if possible. Please call.

## 2014-04-20 NOTE — Telephone Encounter (Signed)
Left detailed message on Angela Howe's vm that I am not sure where she can purchase a wheelchair in [place], but left the names of several places in [place]. Advised her to check their websites for phone #s and hours.  Asked her to call back if we can be of further assistance.

## 2014-04-29 DIAGNOSIS — M5136 Other intervertebral disc degeneration, lumbar region: Secondary | ICD-10-CM | POA: Diagnosis not present

## 2014-04-29 DIAGNOSIS — M5416 Radiculopathy, lumbar region: Secondary | ICD-10-CM | POA: Diagnosis not present

## 2014-05-05 ENCOUNTER — Ambulatory Visit (INDEPENDENT_AMBULATORY_CARE_PROVIDER_SITE_OTHER): Payer: Medicare Other | Admitting: Internal Medicine

## 2014-05-05 ENCOUNTER — Encounter: Payer: Self-pay | Admitting: Internal Medicine

## 2014-05-05 VITALS — BP 112/60 | HR 91 | Temp 98.0°F | Ht 62.0 in | Wt 127.2 lb

## 2014-05-05 DIAGNOSIS — R1084 Generalized abdominal pain: Secondary | ICD-10-CM

## 2014-05-05 DIAGNOSIS — I10 Essential (primary) hypertension: Secondary | ICD-10-CM

## 2014-05-05 DIAGNOSIS — E039 Hypothyroidism, unspecified: Secondary | ICD-10-CM

## 2014-05-05 DIAGNOSIS — I2510 Atherosclerotic heart disease of native coronary artery without angina pectoris: Secondary | ICD-10-CM

## 2014-05-05 DIAGNOSIS — E785 Hyperlipidemia, unspecified: Secondary | ICD-10-CM

## 2014-05-05 DIAGNOSIS — R109 Unspecified abdominal pain: Secondary | ICD-10-CM | POA: Insufficient documentation

## 2014-05-05 DIAGNOSIS — C50919 Malignant neoplasm of unspecified site of unspecified female breast: Secondary | ICD-10-CM

## 2014-05-05 DIAGNOSIS — M545 Low back pain: Secondary | ICD-10-CM

## 2014-05-05 LAB — LIPASE: Lipase: 26 U/L (ref 11.0–59.0)

## 2014-05-05 LAB — BASIC METABOLIC PANEL
BUN: 20 mg/dL (ref 6–23)
CHLORIDE: 107 meq/L (ref 96–112)
CO2: 25 meq/L (ref 19–32)
Calcium: 8.7 mg/dL (ref 8.4–10.5)
Creatinine, Ser: 1.1 mg/dL (ref 0.4–1.2)
GFR: 49.5 mL/min — ABNORMAL LOW (ref >60.00)
Glucose, Bld: 90 mg/dL (ref 70–99)
Potassium: 4.7 mEq/L (ref 3.5–5.1)
SODIUM: 138 meq/L (ref 135–145)

## 2014-05-05 LAB — CBC WITH DIFFERENTIAL/PLATELET
Basophils Absolute: 0.1 10*3/uL (ref 0.0–0.1)
Basophils Relative: 0.7 % (ref 0.0–3.0)
Eosinophils Absolute: 0.2 10*3/uL (ref 0.0–0.7)
Eosinophils Relative: 3.1 % (ref 0.0–5.0)
HEMATOCRIT: 34.4 % — AB (ref 36.0–46.0)
HEMOGLOBIN: 11.5 g/dL — AB (ref 12.0–15.0)
LYMPHS ABS: 2.1 10*3/uL (ref 0.7–4.0)
LYMPHS PCT: 26.7 % (ref 12.0–46.0)
MCHC: 33.3 g/dL (ref 30.0–36.0)
MCV: 90.5 fl (ref 78.0–100.0)
MONO ABS: 0.5 10*3/uL (ref 0.1–1.0)
MONOS PCT: 6.4 % (ref 3.0–12.0)
NEUTROS ABS: 4.9 10*3/uL (ref 1.4–7.7)
Neutrophils Relative %: 63.1 % (ref 43.0–77.0)
PLATELETS: 324 10*3/uL (ref 150.0–400.0)
RBC: 3.8 Mil/uL — ABNORMAL LOW (ref 3.87–5.11)
RDW: 15.9 % — ABNORMAL HIGH (ref 11.5–15.5)
WBC: 7.8 10*3/uL (ref 4.0–10.5)

## 2014-05-05 LAB — AMYLASE: Amylase: 55 U/L (ref 27–131)

## 2014-05-05 LAB — HEPATIC FUNCTION PANEL
ALBUMIN: 3.6 g/dL (ref 3.5–5.2)
ALT: 23 U/L (ref 0–35)
AST: 21 U/L (ref 0–37)
Alkaline Phosphatase: 89 U/L (ref 39–117)
Bilirubin, Direct: 0.1 mg/dL (ref 0.0–0.3)
Total Bilirubin: 0.8 mg/dL (ref 0.2–1.2)
Total Protein: 5.9 g/dL — ABNORMAL LOW (ref 6.0–8.3)

## 2014-05-05 LAB — TSH: TSH: 1.46 u[IU]/mL (ref 0.35–4.50)

## 2014-05-05 NOTE — Progress Notes (Signed)
Pre visit review using our clinic review tool, if applicable. No additional management support is needed unless otherwise documented below in the visit note. 

## 2014-05-05 NOTE — Progress Notes (Signed)
Subjective:    Patient ID: Angela Howe, female    DOB: Dec 19, 1925, 81 y.o.   MRN: [id number]  HPI 81 year old female with past history of CAD s/p CABG followed by Dr Rockey Situ, hypercholesterolemia and orthostatic hypotension.  She comes in today to follow up on these issues as well as for a complete physical exam.   Main complaint now is that of lower back pain.  Is seeing Dr Sharlet Salina.  Is s/p injection.  No better.  Scheduled for f/u MRI 05/07/14.   Has f/u with Dr Sharlet Salina 05/19/2014.  No chest pain or tightness.  No sob.  No nausea or vomiting.  Bowels stable.  No blood in the stool.  No abdominal pain or cramping.   Some intermittent diarrhea.  Takes immodium.  Some abdominal pain intermittently.   Will have intermittent episodes.  No specific triggers.  Has noticed some episodes with eating popcorn and sweets.  No acid reflux.  Some increased gas.      Past Medical History  Diagnosis Date  . CAD (coronary artery disease)   . Orthostatic hypotension   . Syncope   . Breast cancer   . GERD (gastroesophageal reflux disease)   . Osteoarthritis   . Degenerative disc disease   . Hypothyroidism   . Hyperlipidemia     Previous intolerance of statins  . Peptic ulcer disease     Current Outpatient Prescriptions on File Prior to Visit  Medication Sig Dispense Refill  . acetaminophen (TYLENOL) 650 MG CR tablet Take 650 mg by mouth every 8 (eight) hours as needed.      Marland Kitchen amitriptyline (ELAVIL) 50 MG tablet TAKE ONE TABLET BY MOUTH EVERY NIGHT AT BEDTIME 90 tablet 2  . aspirin 81 MG EC tablet Take 81 mg by mouth daily.      Marland Kitchen atorvastatin (LIPITOR) 20 MG tablet Take 1 tablet (20 mg total) by mouth daily. 90 tablet 3  . Calcium-Vitamin D 600-200 MG-UNIT per tablet Take 1 tablet by mouth 2 (two) times daily.      . citalopram (CELEXA) 20 MG tablet TAKE 1 TABLET DAILY 90 tablet 1  . fludrocortisone (FLORINEF) 0.1 MG tablet Take 0.1 mg by mouth daily.    . lansoprazole (PREVACID) 30 MG capsule  Take 30 mg by mouth daily.      . naproxen sodium (ANAPROX) 220 MG tablet Take 220 mg by mouth 2 (two) times daily with a meal.    . nitroGLYCERIN (NITROSTAT) 0.4 MG SL tablet Place 0.4 mg under the tongue every 5 (five) minutes as needed for chest pain.    Marland Kitchen SYNTHROID 50 MCG tablet TAKE 1 TABLET DAILY 90 tablet 1   No current facility-administered medications on file prior to visit.    Review of Systems Patient denies any headache, lightheadedness or dizziness.  No sinus congestion or drainage.  No chest pain, tightness or palpitations. No increased shortness of breath, cough or congestion.  No nausea or vomiting.  No acid reflux.  Intermittent abdominal pain and diarrhea as outlined.  Sometimes will notice with eating certain foods.  Feels she is handling stress well.   Increased back pain.  Seeing Dr Sharlet Salina.  Scheduled for MRI and then f/u with Dr Sharlet Salina.           Objective:   Physical Exam  Filed Vitals:   05/05/14 1333  BP: 112/60  Pulse: 91  Temp: 98 F (47.32 C)   81 year old female in no acute distress.  HEENT:  Nares- clear.  Oropharynx - without lesions. NECK:  Supple.  Nontender.  No audible bruit.  HEART:  Appears to be regular. LUNGS:  No crackles or wheezing audible.  Respirations even and unlabored.  RADIAL PULSE:  Equal bilaterally.    BREASTS:  No nipple discharge or nipple retraction present.  Could not appreciate any distinct nodules or axillary adenopathy.  ABDOMEN:  Soft, nontender.  Bowel sounds present and normal.  No audible abdominal bruit.  GU:  Not performed.    EXTREMITIES:  No increased edema present.  DP pulses palpable and equal bilaterally.         Assessment & Plan:  1. Generalized abdominal pain Intermittent flares as outlined.  Unclear etiology.  Check amylase, lipase, liver panel and met b.  Avoid foods that aggravate.  Probiotic daily.  Discussed further w/up including GI referral and/or CT scan.  She declines at this time.  Wants to  monitor.  Follow.   - Hepatic function panel - CBC with Differential - Amylase - Lipase  2. HYPERTENSION, BENIGN Blood pressure doing well.  Follow.   - Basic metabolic panel  3. Hypothyroidism, unspecified hypothyroidism type On thyroid replacement.   - TSH  4. Hyperlipemia Low cholesterol diet and exercise.  On lipitor.  Follow lipid panel and liver function.    5. Breast cancer, unspecified laterality See below.  Mammogram 09/10/13 - [facility] I.    6. Low back pain without sciatica, unspecified back pain laterality Increased pain.  Seeing Dr Sharlet Salina.  Planning for MRI 05/07/14 and f/u with Dr Sharlet Salina after MRI.    7. Atherosclerosis of native coronary artery of native heart without angina pectoris Seeing Dr Rockey Situ.    HEALTH MAINTENANCE.  Physical today.  Had her mammogram in 5/14.  [facility] 4.  Saw Dr Felton Clinton.  Biopsy negative.  Had f/u mammogram 09/10/13 - [facility] I.   I spent 25 minutes with the patient and more than 50% of the time was spent in consultation regarding the above.

## 2014-05-06 ENCOUNTER — Other Ambulatory Visit: Payer: Self-pay | Admitting: Internal Medicine

## 2014-05-06 DIAGNOSIS — N289 Disorder of kidney and ureter, unspecified: Secondary | ICD-10-CM

## 2014-05-06 DIAGNOSIS — D649 Anemia, unspecified: Secondary | ICD-10-CM

## 2014-05-06 NOTE — Progress Notes (Signed)
Orders placed for f/u labs.  

## 2014-05-07 ENCOUNTER — Ambulatory Visit: Payer: Self-pay | Admitting: Physical Medicine and Rehabilitation

## 2014-05-07 DIAGNOSIS — M4806 Spinal stenosis, lumbar region: Secondary | ICD-10-CM | POA: Diagnosis not present

## 2014-05-07 DIAGNOSIS — S22089A Unspecified fracture of T11-T12 vertebra, initial encounter for closed fracture: Secondary | ICD-10-CM | POA: Diagnosis not present

## 2014-05-09 ENCOUNTER — Encounter: Payer: Self-pay | Admitting: Internal Medicine

## 2014-05-15 ENCOUNTER — Other Ambulatory Visit (INDEPENDENT_AMBULATORY_CARE_PROVIDER_SITE_OTHER): Payer: Medicare Other

## 2014-05-15 DIAGNOSIS — N289 Disorder of kidney and ureter, unspecified: Secondary | ICD-10-CM | POA: Diagnosis not present

## 2014-05-15 DIAGNOSIS — D649 Anemia, unspecified: Secondary | ICD-10-CM

## 2014-05-15 LAB — CBC WITH DIFFERENTIAL/PLATELET
BASOS PCT: 0.4 % (ref 0.0–3.0)
Basophils Absolute: 0 10*3/uL (ref 0.0–0.1)
Eosinophils Absolute: 0.2 10*3/uL (ref 0.0–0.7)
Eosinophils Relative: 2.4 % (ref 0.0–5.0)
HEMATOCRIT: 34.4 % — AB (ref 36.0–46.0)
Hemoglobin: 11.6 g/dL — ABNORMAL LOW (ref 12.0–15.0)
LYMPHS ABS: 2.3 10*3/uL (ref 0.7–4.0)
Lymphocytes Relative: 29.7 % (ref 12.0–46.0)
MCHC: 33.7 g/dL (ref 30.0–36.0)
MCV: 89.8 fl (ref 78.0–100.0)
MONO ABS: 0.4 10*3/uL (ref 0.1–1.0)
Monocytes Relative: 5.2 % (ref 3.0–12.0)
NEUTROS ABS: 4.8 10*3/uL (ref 1.4–7.7)
Neutrophils Relative %: 62.3 % (ref 43.0–77.0)
Platelets: 293 10*3/uL (ref 150.0–400.0)
RBC: 3.84 Mil/uL — AB (ref 3.87–5.11)
RDW: 15.5 % (ref 11.5–15.5)
WBC: 7.7 10*3/uL (ref 4.0–10.5)

## 2014-05-15 LAB — CREATININE, SERUM: CREATININE: 1.02 mg/dL (ref 0.40–1.20)

## 2014-05-15 LAB — IBC PANEL
IRON: 46 ug/dL (ref 42–145)
Saturation Ratios: 16.8 % — ABNORMAL LOW (ref 20.0–50.0)
Transferrin: 196 mg/dL — ABNORMAL LOW (ref 212.0–360.0)

## 2014-05-15 LAB — FERRITIN: Ferritin: 78.2 ng/mL (ref 10.0–291.0)

## 2014-05-18 ENCOUNTER — Encounter: Payer: Self-pay | Admitting: *Deleted

## 2014-05-19 DIAGNOSIS — M5136 Other intervertebral disc degeneration, lumbar region: Secondary | ICD-10-CM | POA: Diagnosis not present

## 2014-05-19 DIAGNOSIS — M5416 Radiculopathy, lumbar region: Secondary | ICD-10-CM | POA: Diagnosis not present

## 2014-05-19 DIAGNOSIS — M4806 Spinal stenosis, lumbar region: Secondary | ICD-10-CM | POA: Diagnosis not present

## 2014-06-11 ENCOUNTER — Other Ambulatory Visit: Payer: Self-pay | Admitting: *Deleted

## 2014-06-11 MED ORDER — CITALOPRAM HYDROBROMIDE 20 MG PO TABS: 20.0000 mg | ORAL_TABLET | Freq: Every day | ORAL | Status: DC

## 2014-06-11 MED ORDER — ATORVASTATIN CALCIUM 20 MG PO TABS: 20.0000 mg | ORAL_TABLET | Freq: Every day | ORAL | Status: DC

## 2014-06-11 MED ORDER — LEVOTHYROXINE SODIUM 50 MCG PO TABS: 50.0000 ug | ORAL_TABLET | Freq: Every day | ORAL | Status: DC

## 2014-06-16 ENCOUNTER — Other Ambulatory Visit: Payer: Self-pay | Admitting: *Deleted

## 2014-06-16 MED ORDER — ATORVASTATIN CALCIUM 20 MG PO TABS: 20.0000 mg | ORAL_TABLET | Freq: Every day | ORAL | Status: DC

## 2014-06-16 MED ORDER — AMITRIPTYLINE HCL 50 MG PO TABS: 50.0000 mg | ORAL_TABLET | Freq: Every day | ORAL | Status: DC

## 2014-07-10 ENCOUNTER — Ambulatory Visit (INDEPENDENT_AMBULATORY_CARE_PROVIDER_SITE_OTHER): Payer: Medicare Other | Admitting: Internal Medicine

## 2014-07-10 ENCOUNTER — Encounter: Payer: Self-pay | Admitting: Internal Medicine

## 2014-07-10 VITALS — BP 120/60 | HR 93 | Temp 98.2°F | Ht 62.0 in | Wt 123.5 lb

## 2014-07-10 DIAGNOSIS — M545 Low back pain: Secondary | ICD-10-CM

## 2014-07-10 DIAGNOSIS — I951 Orthostatic hypotension: Secondary | ICD-10-CM | POA: Diagnosis not present

## 2014-07-10 DIAGNOSIS — I10 Essential (primary) hypertension: Secondary | ICD-10-CM | POA: Diagnosis not present

## 2014-07-10 DIAGNOSIS — E785 Hyperlipidemia, unspecified: Secondary | ICD-10-CM

## 2014-07-10 DIAGNOSIS — R490 Dysphonia: Secondary | ICD-10-CM

## 2014-07-10 DIAGNOSIS — I2510 Atherosclerotic heart disease of native coronary artery without angina pectoris: Secondary | ICD-10-CM

## 2014-07-10 DIAGNOSIS — E039 Hypothyroidism, unspecified: Secondary | ICD-10-CM

## 2014-07-10 DIAGNOSIS — C50919 Malignant neoplasm of unspecified site of unspecified female breast: Secondary | ICD-10-CM

## 2014-07-10 DIAGNOSIS — D649 Anemia, unspecified: Secondary | ICD-10-CM

## 2014-07-10 DIAGNOSIS — Z0000 Encounter for general adult medical examination without abnormal findings: Secondary | ICD-10-CM

## 2014-07-10 MED ORDER — FLUDROCORTISONE ACETATE 0.1 MG PO TABS: 0.1000 mg | ORAL_TABLET | Freq: Every day | ORAL | Status: DC

## 2014-07-10 MED ORDER — NITROGLYCERIN 0.4 MG SL SUBL: 0.4000 mg | SUBLINGUAL_TABLET | SUBLINGUAL | Status: AC | PRN

## 2014-07-10 NOTE — Progress Notes (Signed)
Patient ID: Angela Howe, female   DOB: 1925-06-15, 81 y.o.   MRN: [id number]   Subjective:    Patient ID: Angela Howe, female    DOB: May 26, 1925, 81 y.o.   MRN: [id number]  HPI  Patient here for a scheduled follow up.  Has been seeing Dr Sharlet Salina.  Due to f/u 07/16/14.  States injections not helping.  Some pain in her right leg.  Limiting movement.  Has noticed some hoarseness.  Persistent.  Has to clear her throat.  No acid reflux.  Controlled.  Has tried treating allergies.  Eating and drinking well.     Past Medical History  Diagnosis Date  . CAD (coronary artery disease)   . Orthostatic hypotension   . Syncope   . Breast cancer   . GERD (gastroesophageal reflux disease)   . Osteoarthritis   . Degenerative disc disease   . Hypothyroidism   . Hyperlipidemia     Previous intolerance of statins  . Peptic ulcer disease     Current Outpatient Prescriptions on File Prior to Visit  Medication Sig Dispense Refill  . acetaminophen (TYLENOL) 650 MG CR tablet Take 650 mg by mouth every 8 (eight) hours as needed.      Marland Kitchen amitriptyline (ELAVIL) 50 MG tablet Take 1 tablet (50 mg total) by mouth at bedtime. 90 tablet 2  . aspirin 81 MG EC tablet Take 81 mg by mouth daily.      Marland Kitchen atorvastatin (LIPITOR) 20 MG tablet Take 1 tablet (20 mg total) by mouth daily. 90 tablet 2  . Calcium-Vitamin D 600-200 MG-UNIT per tablet Take 1 tablet by mouth 2 (two) times daily.      . citalopram (CELEXA) 20 MG tablet Take 1 tablet (20 mg total) by mouth daily. 90 tablet 1  . lansoprazole (PREVACID) 30 MG capsule Take 30 mg by mouth daily.      Marland Kitchen levothyroxine (SYNTHROID) 50 MCG tablet Take 1 tablet (50 mcg total) by mouth daily. 90 tablet 1  . naproxen sodium (ANAPROX) 220 MG tablet Take 220 mg by mouth 2 (two) times daily with a meal.     No current facility-administered medications on file prior to visit.    Review of Systems  Constitutional: Negative for appetite change and unexpected weight  change.  HENT: Negative for congestion and sinus pressure.   Respiratory: Negative for cough, chest tightness and shortness of breath.   Cardiovascular: Negative for chest pain, palpitations and leg swelling.  Gastrointestinal: Negative for nausea, vomiting, abdominal pain and diarrhea.  Genitourinary: Negative for dysuria and difficulty urinating.  Musculoskeletal: Positive for back pain. Negative for joint swelling.  Skin: Negative for color change and rash.  Neurological: Negative for dizziness, light-headedness and headaches.  Psychiatric/Behavioral: Negative for dysphoric mood and agitation.       Objective:     Blood pressure recheck 128/78, pulse 88  Physical Exam  Constitutional: She appears well-developed and well-nourished. She appears distressed.  HENT:  Nose: Nose normal.  Mouth/Throat: Oropharynx is clear and moist.  Neck: Neck supple. No thyromegaly present.  Cardiovascular: Normal rate and regular rhythm.   Pulmonary/Chest: Breath sounds normal. No respiratory distress. She has no wheezes.  Abdominal: Soft. Bowel sounds are normal. There is no tenderness.  Musculoskeletal: She exhibits no edema or tenderness.  Lymphadenopathy:    She has no cervical adenopathy.  Skin: No rash noted. No erythema.  Psychiatric: She has a normal mood and affect. Her behavior is normal.    BP  120/60 mmHg  Pulse 93  Temp(Src) 98.2 F (36.8 C) (Oral)  Ht 5\' 2"  (1.575 m)  Wt 123 lb 8 oz (56.019 kg)  BMI 22.58 kg/m2  SpO2 95% Wt Readings from Last 3 Encounters:  07/10/14 123 lb 8 oz (56.019 kg)  05/05/14 127 lb 4 oz (57.72 kg)  04/13/14 126 lb 8 oz (57.38 kg)     Lab Results  Component Value Date   WBC 7.7 05/15/2014   HGB 11.6* 05/15/2014   HCT 34.4* 05/15/2014   PLT 293.0 05/15/2014   GLUCOSE 90 05/05/2014   CHOL 131 02/06/2014   TRIG 162.0* 02/06/2014   HDL 37.40* 02/06/2014   LDLCALC 61 02/06/2014   ALT 23 05/05/2014   AST 21 05/05/2014   NA 138 05/05/2014   K  4.7 05/05/2014   CL 107 05/05/2014   CREATININE 1.02 05/15/2014   BUN 20 05/05/2014   CO2 25 05/05/2014   TSH 1.46 05/05/2014   INR 0.97 06/07/2010       Assessment & Plan:   Problem List Items Addressed This Visit    Anemia    Recheck cbc and ferritin.        Relevant Orders   CBC with Differential/Platelet   Ferritin   Back pain    Seeing Dr Sharlet Salina.  States injections are not helping.  Follow.        Relevant Medications   fludrocortisone (FLORINEF) 0.1 MG tablet   Breast cancer    Mammogram 09/10/13 - [facility] I.  Has a known history of breast cancer.  See previous note.        Relevant Medications   fludrocortisone (FLORINEF) 0.1 MG tablet   CAD, NATIVE VESSEL    Currently doing well.  Stable.  Continue risk factor modification.       Relevant Medications   nitroGLYCERIN (NITROSTAT) SL tablet   Health care maintenance    Physical 05/06/14 - physical.  Mammogram 09/10/13 - [facility] I.        Hoarseness    Persistent hoarseness.  Refer to ENT for evaluation.  Reflux controlled.      Relevant Orders   Ambulatory referral to ENT   Hyperlipemia    Low cholesterol diet and exercise.  Follow lipid panel and liver function tests.  Continue atorvastatin.        Relevant Medications   nitroGLYCERIN (NITROSTAT) SL tablet   Other Relevant Orders   Lipid panel   Hepatic function panel   HYPERTENSION, BENIGN - Primary    Blood pressure has been under good control.  Will need to continue her florinef.  Follow.        Relevant Medications   nitroGLYCERIN (NITROSTAT) SL tablet   Other Relevant Orders   Basic metabolic panel   Hypothyroidism    On levothyroxine.  Follow tsh.       ORTHOSTATIC HYPOTENSION    Continue florinef.  Follow pressures.       Relevant Medications   nitroGLYCERIN (NITROSTAT) SL tablet     I spent 25 minutes with the patient and more than 50% of the time was spent in consultation regarding the above.     Einar Pheasant, MD

## 2014-07-10 NOTE — Progress Notes (Signed)
Pre visit review using our clinic review tool, if applicable. No additional management support is needed unless otherwise documented below in the visit note. 

## 2014-07-16 DIAGNOSIS — M5136 Other intervertebral disc degeneration, lumbar region: Secondary | ICD-10-CM | POA: Diagnosis not present

## 2014-07-16 DIAGNOSIS — M4806 Spinal stenosis, lumbar region: Secondary | ICD-10-CM | POA: Diagnosis not present

## 2014-07-16 DIAGNOSIS — M5416 Radiculopathy, lumbar region: Secondary | ICD-10-CM | POA: Diagnosis not present

## 2014-07-17 ENCOUNTER — Emergency Department: Payer: Self-pay | Admitting: Emergency Medicine

## 2014-07-17 DIAGNOSIS — I6782 Cerebral ischemia: Secondary | ICD-10-CM | POA: Diagnosis not present

## 2014-07-17 DIAGNOSIS — R112 Nausea with vomiting, unspecified: Secondary | ICD-10-CM | POA: Diagnosis not present

## 2014-07-17 DIAGNOSIS — I672 Cerebral atherosclerosis: Secondary | ICD-10-CM | POA: Diagnosis not present

## 2014-07-17 DIAGNOSIS — H538 Other visual disturbances: Secondary | ICD-10-CM | POA: Diagnosis not present

## 2014-07-17 DIAGNOSIS — Z79899 Other long term (current) drug therapy: Secondary | ICD-10-CM | POA: Diagnosis not present

## 2014-07-17 DIAGNOSIS — R55 Syncope and collapse: Secondary | ICD-10-CM | POA: Diagnosis not present

## 2014-07-17 DIAGNOSIS — R0602 Shortness of breath: Secondary | ICD-10-CM | POA: Diagnosis not present

## 2014-07-17 DIAGNOSIS — Z7982 Long term (current) use of aspirin: Secondary | ICD-10-CM | POA: Diagnosis not present

## 2014-07-17 DIAGNOSIS — Z7951 Long term (current) use of inhaled steroids: Secondary | ICD-10-CM | POA: Diagnosis not present

## 2014-07-17 DIAGNOSIS — G319 Degenerative disease of nervous system, unspecified: Secondary | ICD-10-CM | POA: Diagnosis not present

## 2014-07-18 ENCOUNTER — Encounter: Payer: Self-pay | Admitting: Internal Medicine

## 2014-07-18 DIAGNOSIS — R490 Dysphonia: Secondary | ICD-10-CM | POA: Insufficient documentation

## 2014-07-18 DIAGNOSIS — D649 Anemia, unspecified: Secondary | ICD-10-CM | POA: Insufficient documentation

## 2014-07-18 DIAGNOSIS — Z0000 Encounter for general adult medical examination without abnormal findings: Secondary | ICD-10-CM | POA: Insufficient documentation

## 2014-07-18 NOTE — Assessment & Plan Note (Signed)
Seeing Dr Sharlet Salina.  States injections are not helping.  Follow.

## 2014-07-18 NOTE — Assessment & Plan Note (Signed)
Physical 05/06/14 - physical.  Mammogram 09/10/13 - [facility] I.

## 2014-07-18 NOTE — Assessment & Plan Note (Signed)
Low cholesterol diet and exercise.  Follow lipid panel and liver function tests.  Continue atorvastatin.

## 2014-07-18 NOTE — Assessment & Plan Note (Signed)
Continue florinef.  Follow pressures.

## 2014-07-18 NOTE — Assessment & Plan Note (Signed)
Mammogram 09/10/13 - [facility] I.  Has a known history of breast cancer.  See previous note.

## 2014-07-18 NOTE — Assessment & Plan Note (Signed)
Persistent hoarseness.  Refer to ENT for evaluation.  Reflux controlled.

## 2014-07-18 NOTE — Assessment & Plan Note (Signed)
On levothyroxine.  Follow tsh.

## 2014-07-18 NOTE — Assessment & Plan Note (Signed)
Currently doing well.  Stable.  Continue risk factor modification.

## 2014-07-18 NOTE — Assessment & Plan Note (Signed)
Recheck cbc and ferritin.   

## 2014-07-18 NOTE — Assessment & Plan Note (Signed)
Blood pressure has been under good control.  Will need to continue her florinef.  Follow.

## 2014-08-18 DIAGNOSIS — R490 Dysphonia: Secondary | ICD-10-CM | POA: Diagnosis not present

## 2014-08-18 DIAGNOSIS — J387 Other diseases of larynx: Secondary | ICD-10-CM | POA: Diagnosis not present

## 2014-08-18 NOTE — Consult Note (Signed)
General Aspect Angela Howe is a very pleasant 81 year old woman with history of coronary artery disease, status post bypass grafting in 2008, hypertension, hyperlipidemia, breast cancer status post left lumpectomy, and GERD, admission to the hospital on June 10 2010  for diaphoresis, nausea, dizziness.   cardiac catheterization showed patent LIMA graft, vein graft to the diagonal, occluded vein graft to the PDA though the native RCA was patent, good LV function,  chesp pain sx in the past with negative stress test 11/11, presenting with chest pain and back pain.  She reports that yesterday she had acute onset of severe chest pain, lower mediastinum. Pain radiated throuhg to her back. Sx were severe at 9/10. She took NTG x 3 with no relief and came to the ER.   She had a similar episode three weeks ago, chest pain, back pain, also with N/V of black emesis. Pain resolved after vomiting.   She has chronic right hip pain. She is able to do light housework, groceries. She is on florinef for labile blood pressures with orthostatic hypotension.  Syncope  spells in the past without florinef.    Present Illness . SOCIAL HISTORY: Denies any smoking or drinking or illicit drugs. She is living with her grandson, can manage her daily life.   FAMILY HISTORY: Father died at 65 years old because of spinal meningitis. Brother has a history of pancreatic cancer. Mother died at the age of 69.   ALLERGIES: Fosamax, prednisone.   Physical Exam:   GEN well developed, well nourished, no acute distress, thin    HEENT red conjunctivae    NECK supple  No masses    RESP normal resp effort  clear BS    CARD Regular rate and rhythm  No murmur    ABD positive tenderness  soft    LYMPH negative neck    EXTR negative edema    SKIN normal to palpation    NEURO motor/sensory function intact    PSYCH alert, A+O to time, place, person, good insight   Review of Systems:   Subjective/Chief Complaint ABD  discomfort.    General: Weakness    Skin: No Complaints    ENT: No Complaints    Eyes: No Complaints    Neck: No Complaints    Respiratory: No Complaints    Cardiovascular: No Complaints    Gastrointestinal: discomfort    Genitourinary: No Complaints    Vascular: No Complaints    Musculoskeletal: No Complaints    Neurologic: No Complaints    Hematologic: No Complaints    Endocrine: No Complaints    Psychiatric: No Complaints    Review of Systems: All other systems were reviewed and found to be negative    Medications/Allergies Reviewed Medications/Allergies reviewed     Orthostatic Hypotension:    Vertigo:    Hypothyroidism:    Hypercholesterolemia:    Osteoporosis:    Cancer, Breast:    Back Surgery:    heart valve replacement:    hysterectomy:        Admit Diagnosis:   CHEST PAIN: 16-Mar-2012, Active, CHEST PAIN      Admit Reason:   Chest pain: (786.50) Active, ICD9, Unspecified chest pain  Home Medications: Medication Instructions Status  amitriptyline 25 mg oral tablet 1 tab(s) orally once a day (at bedtime)  Active  atorvastatin 20 mg oral tablet 1 tab(s) orally once a day Active  Bayer Low Dose 81 mg oral tablet 1 tab(s) orally once a day Active  fludrocortisone 0.1 mg  oral tablet 1 tab(s) orally once a day as directed Active  citalopram 20 mg oral tablet 1 tab(s) orally once a day Active  levothyroxine 50 mcg (0.05 mg) oral tablet 1 tab(s) orally once a day Active  Vitamin B-12 500 mcg oral tablet 1 tab(s) orally once a day Active  acetaminophen 650 mg oral tablet, extended release 2 tab(s) orally every 8 hours, As Needed- for Pain  Active  PreserVision oral tablet 1 tab(s) orally once a day Active   Lab Results:  Hepatic:  16-Nov-13 05:36    Bilirubin, Total  1.4   Bilirubin, Direct  0.90 (Result(s) reported on 16 Mar 2012 at 09:46AM.)   Alkaline Phosphatase  245   SGPT (ALT)  1493   SGOT (AST)  > 2000   Total Protein, Serum   6.1   Albumin, Serum  2.8  Routine Chem:  16-Nov-13 05:36    Glucose, Serum  101   BUN  24   Creatinine (comp) 1.24   Sodium, Serum 141   Potassium, Serum 3.7   Chloride, Serum  110   CO2, Serum 26   Calcium (Total), Serum  8.1   Anion Gap  5   Osmolality (calc) 285   eGFR (African American)  46   eGFR (Non-African American)  40 (eGFR values <61m/min/1.73 m2 may be an indication of chronic kidney disease (CKD). Calculated eGFR is useful in patients with stable renal function. The eGFR calculation will not be reliable in acutely ill patients when serum creatinine is changing rapidly. It is not useful in  patients on dialysis. The eGFR calculation may not be applicable to patients at the low and high extremes of body sizes, pregnant women, and vegetarians.)  Cardiac:  16-Nov-13 05:36    Troponin I < 0.02 (0.00-0.05 0.05 ng/mL or less: NEGATIVE  Repeat testing in 3-6 hrs  if clinically indicated. >0.05 ng/mL: POTENTIAL  MYOCARDIAL INJURY. Repeat  testing in 3-6 hrs if  clinically indicated. NOTE: An increase or decrease  of 30% or more on serial  testing suggests a  clinically important change)   EKG:   Interpretation EKG shows NSR with rate 88 bpm, no significant ST or T wave changes    Prednisone: Other  Fosamax: Other  Vital Signs/Nurse's Notes: **Vital Signs.:   16-Nov-13 05:09   Vital Signs Type Routine   Temperature Temperature (F) 98.1   Celsius 36.7   Temperature Source Oral   Pulse Pulse 83   Respirations Respirations 17   Systolic BP Systolic BP 1161  Diastolic BP (mmHg) Diastolic BP (mmHg) 76   Mean BP 95   Pulse Ox % Pulse Ox % 96   Pulse Ox Activity Level  At rest   Oxygen Delivery Room Air/ 21 %     Impression Angela Howe a very pleasant 81year old woman with history of coronary artery disease, status post bypass grafting in 2008, hypertension, hyperlipidemia, breast cancer status post left lumpectomy, and GERD, admission to the hospital on  June 10 2010  for diaphoresis, nausea, dizziness.    chesp pain sx in the past with negative stress test 11/11, presenting with chest pain and back pain.  1) Chest pain: cardiac enz negative x 3, EKG normal In light of elevated LFTS, u/s results, similar sx three weeks ago with N/V of bilious material,  less likely cardiac etiology and more consistent with gall bladder disease. --last cardiac cath 06/2010 No recent angina She would be acceptable for Lap chole or open  surgery if indicated. She has indicated that she would like to have this done this admission of possible.  2) CAD, CABG: cardiac cath 06/2010 cardiac catheterization showed patent LIMA graft, vein graft to the diagonal, occluded vein graft to the PDA though the native RCA was patent, good LV function,  --on heparin subcutaneous,lipitor, asa 81 mg daily will start metoprolol 12.5 mg po BID (had not been on in the past secondary to orthostatic type sx) Hold off on ACE at this time given labile BP in the past. Hold off on high dose asa and plavix as she may need surgery in two days (Monday)  3) Orthostatic hypotension: Would continue florinef daily 0.1 mg  4) Elevated LFTS: gall stones she does take tylenol 650 mg x 4 per day   Electronic Signatures: Ida Rogue (MD)  (Signed [phone] 10:26)  Authored: General Aspect/Present Illness, History and Physical Exam, Review of System, Past Medical History, Health Issues, Home Medications, Labs, EKG , Allergies, Vital Signs/Nurse's Notes, Impression/Plan   Last Updated: 16-Nov-13 10:26 by Ida Rogue (MD)

## 2014-08-18 NOTE — Consult Note (Signed)
Pt not back from surgery. I will be out at [facility] tomorrow. If GI needed tomorrow, call GI on call. Otherwise, will check back on Wed. Thanks.  Electronic Signatures: Verdie Shire (MD)  (Signed on [phone] 15:48)  Authored  Last Updated: [id number] 15:48 by Verdie Shire (MD)

## 2014-08-18 NOTE — H&P (Signed)
PATIENT NAME:  Angela Howe, Angela Howe MR#:  [id number] DATE OF BIRTH:  1926-02-13  DATE OF ADMISSION:  03/15/2012  PRIMARY CARE PHYSICIAN: Dr. Nicki Reaper  REFERRING PHYSICIAN: Lenise Arena, MD   CHIEF COMPLAINT: Chest pain today.   HISTORY OF PRESENT ILLNESS: The patient is an 81 year old Caucasian female with a history of coronary artery disease, hyperlipidemia, peptic ulcer disease, presented to the ED with chest pain today. The patient is alert, awake, oriented, looks anxious but in no acute distress. The patient said she started to have chest pain since this morning which is in the substernal area, constant, aching, 6 out of 10 with radiation to the back. In addition, the patient complains of bilateral shoulder pain, shortness of breath, but she denies any fever or chills. No headache or dizziness. No palpitations, orthopnea, or nocturnal dyspnea. No diaphoresis. No leg swelling.   PAST MEDICAL HISTORY:  1. Lumbar degenerative disk disease.  2. Osteoarthritis.  3. Peptic ulcer disease.  4. Vertebral fracture.  5. Hyperlipidemia. 6. Rotator cuff tendinitis with impingement. 7. Osteopenia. 8. History of breast cancer, status post radiation.  9. Coronary artery disease. 10. Orthostatic hypotension.   PAST SURGICAL HISTORY:  1. Hysterectomy.  2. Appendectomy.  3. Sinus surgery.  4. CABG.  SOCIAL HISTORY: Denies any smoking or drinking or illicit drugs. She is living with her grandson, can manage her daily life.   FAMILY HISTORY: Father died at 5 years old because of spinal meningitis. Brother has a history of pancreatic cancer. Mother died at the age of 81.   ALLERGIES: Fosamax, prednisone.   HOME MEDICATIONS:  1. Vitamin B12, 500 mcg p.o. daily. 2. PreserVision  tablet, 1 tablet p.o. daily.  3. Levothyroxine 50 mcg p.o. daily.  4. Fludrocortisone. 0.1 mg p.o. daily.  5. Citalopram 20 mg p.o. daily.  6. Aspirin 81 mg p.o. daily.  7. Atorvastatin 20 mg p.o. daily.  8. Amitriptyline  25 mg p.o. daily at bedtime.  9. Tylenol 650 mg p.o. daily, 2 tablets every 8 hours p.r.n.   REVIEW OF SYSTEMS: CONSTITUTIONAL: The patient denies any fever or chills. No headache or dizziness. No weakness. EYES: No double vision, blurred vision. ENT: No epistaxis, postnasal drip, or slurred speech. CARDIOVASCULAR: Positive for chest pain. No palpitations, orthopnea, or nocturnal dyspnea. No leg edema. PULMONARY: Positive for shortness of breath but no cough, sputum, or hemoptysis. GASTROINTESTINAL: No abdominal pain, nausea, vomiting, or diarrhea. GU: No dysuria, hematuria or incontinence. SKIN: No rash or jaundice. HEMATOLOGIC: No easy bruising, bleeding. NEUROLOGY: No syncope, loss of consciousness or seizure.   PHYSICAL EXAMINATION:  VITAL SIGNS: Temperature 98.1, blood pressure 129/66, pulse 87, respirations 18, oxygen saturation 98% on room air.   GENERAL: The patient is alert, awake, oriented, in no acute distress, looks anxious.   HEENT: Pupils are round, equal, reactive to light and accommodation. Mouth: Dry oral mucosa.  Clear oropharynx.  NECK: Supple. No JVD or carotid bruits. No lymphadenopathy. No thyromegaly.    CARDIOVASCULAR: S1, S2 regular rate, rhythm. No murmurs, rubs or gallops.   PULMONARY: Bilateral air entry. No wheezing or rales. No use of accessory muscles to breathe.  ABDOMEN: Soft. No distention or tenderness. No organomegaly. Bowel sounds present.   EXTREMITIES: No edema, clubbing, or cyanosis. No calf tenderness.   CHEST WALL: Tenderness in the substernal area on palpation.   SKIN: No rash or jaundice.   NEUROLOGIC: Alert and oriented x3. No focal deficit. Power 5 out of 5. Sensation intact. Deep tendon 2+.   LABORATORY,  DIAGNOSTIC AND RADIOLOGICAL DATA: Chest x-ray showed no acute disease of the chest. CK 53, CK-MB 1.1. Glucose 96, BUN 21, creatinine 0.88. Electrolytes normal. SGPT 144, SGOT 337, albumin 3.0, bilirubin 0.6. WBC 7.2, hemoglobin 11.6,  platelets 211. Troponin less than 0.02. EKG showed normal sinus rhythm at 88 beats per minute.   IMPRESSION:  1. Chest pain, possible musculoskeletal etiology but need to rule out ACS. 2. Coronary artery disease.  3. Dehydration.  4. Abnormal liver function tests.  5. Hyperlipidemia.  6. Peptic ulcer disease.  7. Anxiety.   PLAN OF TREATMENT:  1. The patient will be placed for observation. We will continue the telemonitor. O2 by nasal cannula. Continue aspirin 81 mg p.o. daily. Continue a statin. Follow up troponin level, and get a Cardiology consult from Dr. Rockey Situ.  2. For mild dehydration, we will start normal saline and follow up BMP.  3. We will give Percocet for pain control.  4. We will give Xanax for anxiety.  5. We will get an abdominal ultrasound due to abnormal liver function tests.  6. GI and deep vein thrombosis prophylaxis.  I discussed the patient's situation and plan of treatment with the patient and the patient's daughter.   TIME SPENT: About 60 minutes.   ____________________________ Demetrios Loll, MD qc:cbb D: 03/15/2012 15:23:04 ET T: 03/15/2012 16:59:22 ET JOB#: [id number]  cc: Demetrios Loll, MD, <Dictator> Einar Pheasant, MD Demetrios Loll MD ELECTRONICALLY SIGNED 03/17/2012 4:13

## 2014-08-18 NOTE — Op Note (Signed)
PATIENT NAME:  Angela Howe, Angela Howe MR#:  [id number] DATE OF BIRTH:  05-17-1925  DATE OF PROCEDURE:  03/18/2012  PREOPERATIVE DIAGNOSIS: Choledocholithiasis.  POSTOPERATIVE DIAGNOSIS: Choledocholithiasis.   PROCEDURE PERFORMED: Laparoscopic cholecystectomy.   SURGEON: Rilei Kravitz A. Cassadee Vanzandt, MD   ANESTHESIA: General.   SPECIMENS: Gallbladder.   ESTIMATED BLOOD LOSS: 10 mL.   COMPLICATIONS: Avulsion of cystic duct.   INDICATION FOR SURGERY: Angela Howe is a pleasant 81 year old female with history of right upper quadrant pain with elevated LFTs. They had returned to normal and it was our thought that she had potentially passed the stone and thus underwent prophylactic cholecystectomy to prevent future episodes. Plan was for cholangiogram which was not completed.   DETAILS OF PROCEDURE: Appropriate consent was obtained. Angela Howe was brought to the operating room. She was laid in the supine position. She was induced. An endotracheal tube was placed and general anesthesia was administered. Her abdomen was prepped and draped in standard surgical fashion. A time-out was then performed correctly identifying the patient name, operative site, and procedure to be performed. A supraumbilical incision was then made. It was deepened down to the fascia. The fascia was grasped. Fascia was incised. A finger was placed in the abdomen. There were no underlying adhesions. Two stay sutures were placed through the fascia. A Hassan trocar was then placed into the abdomen. The abdomen was insufflated. The gallbladder was evaluated. There were adhesions between the anterior abdominal wall and the liver. A 5 mm anterior axillary subcostal port was then placed and I sharply freed the liver from the abdominal wall so that that I could retract it without tearing it. An 11 mm epigastric port and a right midclavicular port were then placed approximately 2 cm below the costal margin. The gallbladder was grasped with the lateral most  port and retracted over the dome of the liver. The cystic artery and cystic duct were dissected out. The cystic duct upon clearing it off broke quite easily in half and I was able to dissect out the more distal portion, however, it was short and it appeared to be potentially next to the common duct. I was afraid that if I attempted cholangiogram that I could dissect it further into the common duct which would require me to convert to an open surgery which would have high morbidity in an 81 year old and I thus deferred cholangiogram. I was able to put two clips across the distal duct and I was able to isolate the cystic artery and clip that and ligate it as well. The gallbladder was then taken off the gallbladder fossa. The gallbladder was then taken out through the supraumbilical incision with an EndoCatch bag. I then looked in the gallbladder fossa. There was no obvious bleeding. I then took the ports out under direct visualization. The supraumbilical port was then closed using the previously placed stay sutures. The skin was then closed with interrupted 4-0 Monocryl or deep dermal or running subcuticular. Steri-Strips were then placed over the incisions. I did place a 5 [place] JP drain through the lateral most port to ensure that there was no bile leak from the avulsed cystic duct remnant. This was secured with a 3-0 nylon. Dressing was then placed over the gauze. The patient was then awoken, extubated, and brought to the postanesthesia care unit. There were no immediate complications.   ____________________________ Glena Norfolk Shailen Thielen, MD cal:drc D: 03/18/2012 16:40:40 ET T: 03/19/2012 08:05:13 ET JOB#: [id number]  cc: Harrell Gave A. Granvil Djordjevic, MD, <Dictator> Jvon Meroney A Steger  MD ELECTRONICALLY SIGNED 03/19/2012 17:54

## 2014-08-18 NOTE — Consult Note (Signed)
PATIENT NAME:  Angela Howe, Angela Howe MR#:  [id number] DATE OF BIRTH:  Oct 30, 1925  DATE OF CONSULTATION:  03/16/2012  REFERRING PHYSICIAN:   CONSULTING PHYSICIAN:  Jerrol Banana. Burt Knack, MD  CHIEF COMPLAINT: Abdominal pain.   HISTORY OF PRESENT ILLNESS: This is a patient who describes several days of chest pain. She had a similar episode several weeks ago.  She had taken nitroglycerin and it did not help so she called the rescue squad and was brought in through the Emergency Room with a diagnosis of possible gallbladder disease when an ultrasound suggested gallstones and some pericholecystic fluid and thickening. The patient denies fevers or chills. No jaundice. No acholic stools. She has had similar episodes not necessarily related to fatty food intolerance.   PAST MEDICAL HISTORY:  1. Disc disease.  2. Peptic ulcer disease.  3. Hyperlipidemia.  4. Osteopenia.  5. History of breast cancer.  6. Coronary artery disease.   PAST SURGICAL HISTORY:  1. Left mastectomy.  2. Coronary artery bypass grafting.  3. Hysterectomy.  4. Appendectomy.   SOCIAL HISTORY: The patient does not smoke or drink.   REVIEW OF SYSTEMS: 10-system review was performed and negative with the exception of that mentioned in the history of present illness.   MEDICATIONS: Multiple, see chart.   PHYSICAL EXAMINATION:  GENERAL: Healthy-appearing, comfortable-appearing Caucasian female patient.   VITAL SIGNS: Temperature 99.3, pulse 85, respirations 16, blood pressure 145/69, 98% room air saturation.   HEENT: No scleral icterus.   NECK: No palpable neck nodes.   CHEST: Clear to auscultation.   CARDIAC: Regular rate and rhythm.   ABDOMEN: Soft, minimally tender in the epigastrium and right upper quadrant.   EXTREMITIES: Without edema. Calves are nontender.   NEUROLOGIC: Grossly intact.   INTEGUMENT: No jaundice.   LABORATORY VALUES: Showed an initial white blood cell count of 7.2, hemoglobin and hematocrit 11.6 and  34, and in the Emergency Room yesterday her LFTs were elevated with a normal bilirubin of 0.6, with an AST/ALT of 337/144, and alkaline phosphatase of 163. Creatinine was normal. This morning retesting blood test demonstrated creatinine 1.24, alkaline phosphatase of 245, bilirubin 1.4, AST and ALT extremely elevated to over 2000 and 1493 respectively. Albumin of 2.8.   Ultrasound showed stones and thickened gallbladder wall.   ASSESSMENT AND PLAN: This is a patient who presents with recurrent chest pain and epigastric pain and work-up initially suggested gallstones with choledocholithiasis. However, with this extremely high pattern of AST and ALT, this brings in the question of whether or not this is truly a simple biliary tract obstruction related to gallstones. I would be more concerned of either a viral or a medicine-related hepatocyte dysfunction or even ischemic event. I discussed this with Dr. Karsten Fells, who agreed that a GI consult was in order. Once GI consult is obtained, if necessary laparoscopic cholecystectomy can be performed if no alternative causes are identified.     I will continue to follow the patient while in the hospital.     ____________________________ Jerrol Banana. Burt Knack, MD rec:bjt D: 03/16/2012 14:29:09 ET T: 03/16/2012 15:00:40 ET JOB#: [id number]  cc: Jerrol Banana. Burt Knack, MD, <Dictator> Florene Glen MD ELECTRONICALLY SIGNED 03/17/2012 12:24

## 2014-08-18 NOTE — Consult Note (Signed)
Brief Consult Note: Diagnosis: choledocholithiasis.   Patient was seen by consultant.   Consult note dictated.   Recommend further assessment or treatment.   Orders entered.   Discussed with Attending MD.   Comments: elevated LFT with gallstones. pain ghas resolved but suspect choledocholithiasis will recheck LFT and consider surgery based on pattern .  Electronic Signatures: Florene Glen (MD)  (Signed [phone] 08:12)  Authored: Brief Consult Note   Last Updated: 16-Nov-13 08:12 by Florene Glen (MD)

## 2014-08-18 NOTE — Consult Note (Signed)
PATIENT NAME:  Angela Howe, Angela Howe MR#:  [id number] DATE OF BIRTH:  Jan 12, 1926  DATE OF CONSULTATION:  03/17/2012  REFERRING PHYSICIAN:   CONSULTING PHYSICIAN:  Lupita Dawn. Coner Gibbard, MD  REASON FOR REFERRAL: Acute rise in liver enzymes.   DESCRIPTION: The patient is a pleasant 81 year old white female who was brought in due to acute chest pain that she was experiencing the morning of admission that radiated to her back. She denied having any nausea, vomiting, or indigestion. She does have a history of peptic ulcer disease. Both cardiology and surgery were consulted. Cardiology, even though she had known history of coronary artery disease, did not feel that this was cardiac in nature. Surgery was consulted because of gallstones and thickened gallbladder wall. However, they are planning on doing cholecystectomy. However, there was a sudden rise in liver enzymes that caused them to postpone the surgery until I evaluate the patient.   This morning the chest pain and back pain is all gone. However, she is now having more right upper quadrant pain. Fortunately, there is significant improvement in her liver enzymes back to what she was experiencing on the day of admission. When I looked at her records, the patient was fairly hypotensive Friday evening prior to her jump in liver enzyme elevation.   PAST MEDICAL HISTORY:  1. Ulcer disease. 2. Vertebral fracture.  3. Osteopenia. 4. Breast cancer history status post radiation. 5. Coronary artery disease.   PAST SURGICAL HISTORY:  6. Back surgery. 7. Coronary artery bypass surgery.  8. Appendectomy.  9. Hysterectomy.  10. Sinus surgery.   FAMILY HISTORY: Notable for spinal meningitis and pancreatic cancer; her brother has pancreatic cancer.   ALLERGIES: Prednisone and Fosamax.   MEDICATIONS AT HOME: Baby aspirin, atorvastatin, Elavil, Tylenol, levothyroxine, and vitamins.   REVIEW OF SYSTEMS: There is no fever, chills, weakness, or fatigue. There is no blurred  vision or hearing changes. Again, she had chest pain on admission but she no longer has them. There are no palpitations. There is no coughing or shortness of breath. GI symptoms - initially there was abdominal pain but now she has right upper quadrant pain. There is no vomiting, nausea, diarrhea, hematochezia, or melena. The rest of the review of symptoms is negative.   PHYSICAL EXAMINATION:   GENERAL: The patient is in no acute distress right now.   VITAL SIGNS: She is afebrile. Vital signs are stable. But again on Friday night, her blood pressure was in the low 80s and she does admit to having episodes of hypotension.   HEENT: Normocephalic, atraumatic head. Pupils are equally reactive. Throat was clear.   NECK: Supple.   CARDIAC: Regular rhythm and rate without murmurs.   LUNGS: Clear bilaterally.   ABDOMEN: Normoactive bowel sounds, soft. There is some tenderness in the right upper quadrant area. No rebound or guarding. There was no hepatomegaly. She had active bowel sounds.   EXTREMITIES: No clubbing, cyanosis, or edema.   SKIN: Negative.   NEUROLOGICAL: Examination is nonfocal.  RESULTS: On admission her bilirubin was 0.6, alkaline phosphatase 163, AST 337, and ALT 144, but by the next day, on 03/16/2012, bilirubin had jumped to 1.4, alkaline phosphatase 245, AST greater than 2000, and ALT 1493. Today there is significant improvement with total bilirubin of 1.1, alkaline phosphatase 199, AST 709, and ALT 788. Today sodium 143, potassium 3.3, chloride 110, CO2 25, BUN 14, creatinine 1.01, and glucose 96. CPK enzymes and troponin levels were all normal. White count was 7.2, hemoglobin 11.6, and  platelet count 211. INR is 1.1. Tylenol level is 12. Hepatitis A antibody was positive, but hepatitis A IgM is negative. Hepatitis B is negative and hepatitis C is negative.   There are gallstones, gallbladder wall thickening at 4.2, and common bile duct is normal at 6.2 mm.   ASSESSMENT AND  PLAN: This is a patient with initial chest pain. Now the pain is in the right upper quadrant area. There is evidence of gallstones. Even though the bilirubin was elevated and the liver enzymes shot up, there is no evidence of common bile duct obstruction from a stone at this time. I suspect that she had a hypotensive event that caused her liver enzymes to shoot up.   I would recommend proceeding with gallbladder surgery with intraoperative cholangiogram. If the intraoperative cholangiogram is positive, then we will consider ERCP afterwards. I did discuss the procedure with the patient. The other option is to do MRCP first. If the MRCP is clearly abnormal then we can do the ERCP prior to gallbladder surgery. I will leave the option to Surgery to decide. Thank you for the referral.  ____________________________ Lupita Dawn. Candace Cruise, MD pyo:slb D: 03/18/2012 12:19:00 ET T: 03/18/2012 12:33:16 ET JOB#: [id number]  cc: Lupita Dawn. Candace Cruise, MD, <Dictator> Lupita Dawn Brodyn Depuy MD ELECTRONICALLY SIGNED 03/18/2012 13:36

## 2014-08-18 NOTE — Consult Note (Signed)
Pt seen and examined. Full consult to follow. Admitted initially with chest and back pain. U/S showed gallstones and thickened gallbladder. Evaluated by both surgery and cardiology. Sudden rise in LFT yest AM. On review, patient was hypotensive Fri evening. Wonder if patient had ischemia to the liver that caused the LFT to shoot up yest. Fortunately, LFT much better today. No more CP but RUQ pain today. RUQ area tender. CBD normal on U/S. I would proceed with GB surgery with intraop cholangiogram. IF IOC postive, then will consider ERCP afterwards. Other option is to order MRCP 1st. If MRCP clearly abnormal, then ERCP prior to GB surgery. Will leave the options to surgery to decide. Anyway, I did discuss ERCP in detail with patient as well as potential risks. Thanks.  Electronic Signatures: Verdie Shire (MD)  (Signed on 17-Nov-13 09:58)  Authored  Last Updated: [id number] 09:58 by Verdie Shire (MD)

## 2014-08-18 NOTE — Consult Note (Signed)
Chief Complaint:   Subjective/Chief Complaint C/O heartburn. Requests prevacid which she takes at home. LFT coming down but still abnormal.   VITAL SIGNS/ANCILLARY NOTES: **Vital Signs.:   20-Nov-13 13:17   Vital Signs Type Routine   Temperature Temperature (F) 98.5   Celsius 36.9   Temperature Source Oral   Pulse Pulse 106   Respirations Respirations 20   Systolic BP Systolic BP 390   Diastolic BP (mmHg) Diastolic BP (mmHg) 86   Mean BP 114   Pulse Ox % Pulse Ox % 90   Pulse Ox Activity Level  At rest   Oxygen Delivery 2L   Brief Assessment:   Cardiac Regular    Respiratory clear BS    Gastrointestinal mildly tender   Lab Results:  Hepatic:  20-Nov-13 08:20    Bilirubin, Total 0.8   Alkaline Phosphatase  290   SGPT (ALT)  344   SGOT (AST)  128   Total Protein, Serum 7.0   Albumin, Serum  2.9  Routine Chem:  20-Nov-13 08:20    Glucose, Serum  102   BUN 10   Creatinine (comp) 0.78   Sodium, Serum 136   Potassium, Serum 4.0   Chloride, Serum 105   CO2, Serum 24   Calcium (Total), Serum 8.7   Osmolality (calc) 271   eGFR (African American) >60   eGFR (Non-African American) >60 (eGFR values <24m/min/1.73 m2 may be an indication of chronic kidney disease (CKD). Calculated eGFR is useful in patients with stable renal function. The eGFR calculation will not be reliable in acutely ill patients when serum creatinine is changing rapidly. It is not useful in  patients on dialysis. The eGFR calculation may not be applicable to patients at the low and high extremes of body sizes, pregnant women, and vegetarians.)   Anion Gap 7  Routine Hem:  20-Nov-13 08:20    WBC (CBC) 10.3   RBC (CBC)  3.74   Hemoglobin (CBC)  11.7   Hematocrit (CBC)  34.6   Platelet Count (CBC) 178   MCV 93   MCH 31.4   MCHC 33.9   RDW 14.1   Neutrophil % 76.0   Lymphocyte % 14.3   Monocyte % 7.1   Eosinophil % 2.1   Basophil % 0.5   Neutrophil #  7.9   Lymphocyte # 1.5   Monocyte #  0.7   Eosinophil # 0.2   Basophil # 0.0 (Result(s) reported on 20 Mar 2012 at 08:50AM.)   Assessment/Plan:  Assessment/Plan:   Assessment S/P choly. Cholangiogram could not be done although MRCP neg for biliary stone.    Plan Prevacid daily or alternative PPI. Can give bid if once daily inadequate.Prob discharge soon. If LFT remains elevated, then patient can f/u with [facility] evaluate liver further. Thanks.   Electronic Signatures: OVerdie Shire(MD)  (Signed [id number])  Authored: Chief Complaint, VITAL SIGNS/ANCILLARY NOTES, Brief Assessment, Lab Results, Assessment/Plan   Last Updated: 20-Nov-13 14:44 by OVerdie Shire(MD)

## 2014-08-18 NOTE — Discharge Summary (Signed)
PATIENT NAME:  ADESUWA, OSGOOD MR#:  [id number] DATE OF BIRTH:  [birth year]  DATE OF ADMISSION:  03/15/2012 DATE OF DISCHARGE:  03/21/2012  DISCHARGE DIAGNOSES: 1. Acute cholecystitis.  2. Lumbar degenerative disk disease.  3. Peptic ulcer disease. 4. Osteoarthritis.  5. History of vertebral fracture.  6. Hyperlipidemia.  7. Rotator cuff tendinitis with impingement.  8. Osteopenia.  9. History of breast cancer status post radiation.  10. Coronary artery disease.  11. Orthostatic hypotension.  12. History of hysterectomy.  13. History of appendectomy.  14. History sinus surgery.  15. CABG.   DISCHARGE MEDICATIONS:  1. Amitriptyline 25 mg p.o. at bedtime.  2. Atorvastatin 20 mg p.o. daily. 3. Aspirin 81 mg p.o. daily. 4. Fludrocortisone 0.1 mg p.o. daily.  5. Citalopram 60 mg p.o. daily.  6. L-thyroxine 50 mcg p.o. daily.  7. B12 500 mcg one tab p.o. daily.  8. Norco 1 to 2 tabs p.o. every 4 hours p.r.n. pain.  9. PreserVision oral tablet 1 tab p.o. daily.   INDICATION FOR ADMISSION: Ms. Demedeiros is a pleasant 81 year old female who presents with abdominal pain which did not resolve with nitroglycerin. She also had elevated LFTs. We were consulted to evaluate her and due to the concern that this could be choledocholithiasis gastroenterology was consulted. They felt the clinical picture and labs and imaging indicated probable acute cholecystitis; therefore, she will underwent laparoscopic cholecystectomy on 03/18/2012. Postoperatively, she had pain and was transitioned slowly to p.o. pain medications. She also had oxygen requirement and oxygen was weaned off. At the time of discharge, Ms. Mester was tolerating p.o. good with good p.o. pain control and was voiding and breathing without difficulty and was discharged home.   DISCHARGE INSTRUCTIONS: Ms. Weiss is to followup in approximately one week. We had to place a drain at the time of her surgery due to the difficulty of surgery. We will  remove that at follow-up. She is to record the outputs until follow-up. She is to call or return to the ED if she develops fever greater than 101.5, nausea, vomiting, increased pain, or any change in character or quantity of her drainage.  ____________________________ Glena Norfolk Reiley Bertagnolli, MD cal:slb D: 03/27/2012 14:29:59 ET T: 03/27/2012 14:36:16 ET JOB#: [id number]  cc: Harrell Gave A. Eliel Dudding, MD, <Dictator> Floyde Parkins MD ELECTRONICALLY SIGNED 03/29/2012 9:44

## 2014-08-19 DIAGNOSIS — M5126 Other intervertebral disc displacement, lumbar region: Secondary | ICD-10-CM | POA: Diagnosis not present

## 2014-08-19 DIAGNOSIS — M5416 Radiculopathy, lumbar region: Secondary | ICD-10-CM | POA: Diagnosis not present

## 2014-08-21 NOTE — Op Note (Signed)
PATIENT NAME:  [name], Angela Howe MR#:  [id number] DATE OF BIRTH:  06/09/1925  DATE OF PROCEDURE:  09/18/2012  RE-DICTATION:  09/26/2012  PREOPERATIVE DIAGNOSIS:  History of left breast carcinoma and left breast nonpalpable mass.  PROCEDURE PERFORMED:  Excisional biopsy with needle localization.   SURGEON: Sherri Rad, M.D.  FACS  ASSISTANT:  Scrub Nurse.  SPECIMEN:  As described above.   DESCRIPTION OF PROCEDURE:  With successful ultrasound-guided needle localization placement, the patient was brought to the operating room.  The left breast was sterilely prepped and draped with ChloraPrep solution, after general anesthesia was induced. Timeout was observed. An incision was fashioned along the lines of tension on the left breast encompassing the wire path. The wire was then brought up through this wound. An excisional biopsy was performed in its totality and specimen was submitted to pathology in formalin. The wound was then inspected for hemostasis. Small 3 mm clips were then placed for future mammographic detection. The wound was closed in multiple layers of 3-0 and 4-0 Vicryl, 4-0 Vicryl subcuticular in the skin, Benzoin, Steri-Strips, Telfa, and Tegaderm. The patient tolerated the procedure well without immediate complication.    ____________________________ Jeannette How Marina Gravel, MD mab:dmm D: 09/26/2012 13:08:14 ET T: 09/26/2012 13:23:56 ET JOB#: [id number]  cc: Elta Guadeloupe A. Marina Gravel, MD, <Dictator> Hortencia Conradi MD ELECTRONICALLY SIGNED 09/27/2012 12:10

## 2014-08-22 NOTE — H&P (Signed)
PATIENT NAME:  Angela Howe, Angela Howe MR#:  [id number] DATE OF BIRTH:  [birth year]  DATE OF ADMISSION:  10/22/2013  PRIMARY CARE PHYSICIAN: Dr. Einar Pheasant.  REFERRING PHYSICIAN: Dr. Owens Shark.   CHIEF COMPLAINT: Acute on chronic back pain.   HISTORY OF PRESENT ILLNESS: The patient is an 81 year old pleasant Caucasian female is presenting to the ED with a chief complaint of worsening of low back pain. The patient is reporting that she sustained a fall approximately four weeks ago, following that she started having acute low back pain. Initially, on the x-ray they could not find any fractures and she was sent home. The patient's chronic low back pain for the past four weeks has been worse in the past two days. She denies any new traumas or injuries. She has chronic history of osteoporosis. She is having lots of great difficulty with ambulation because of worsening of low back pain. Touch sensation is intact and denies any urinary retention or incontinence. Denies any bowel incontinence either. Her daughter who came with her left by the time I was examining the patient. The patient had CAT scan of the abdomen and pelvis done which has revealed acute progression of compression fractures at L4. During my examination, the patient is complaining of pain 10 out of 10. Denies any chest pain or shortness of breath. No similar complaints in the past.   PAST MEDICAL HISTORY: Hyperlipidemia, hypothyroidism, osteoporosis, breast cancer, GERD, peptic ulcer disease, lumbar degenerative joint disease, rotator cuff tendinitis with impingement, history of coronary artery disease, orthostatic hypotension.   PAST SURGICAL HISTORY: Hysterectomy, appendectomy, sinus repair, coronary artery bypass grafting.   ALLERGIES: THE PATIENT IS ALLERGIC TO FOSAMAX AND PREDNISONE.   PSYCHOSOCIAL HISTORY: Lives at home, lives alone. Daughter lives close by and takes care of her. Denies any smoking, alcohol or illicit drug usage.   FAMILY  HISTORY: Father died at age 46 from spinal meningitis. Brother has a history of pancreatic cancer. Mother died at age 9.    REVIEW OF SYSTEMS:  CONSTITUTIONAL: Denies any fever or fatigue.  EYES: Denies blurry vision, double vision.  ENT: Denies epistaxis or discharge, post nasal drip.  RESPIRATION: Denies cough, chronic obstructive pulmonary disease.  CARDIOVASCULAR: Denies any chest pain, palpitations, orthopnea.  GASTROINTESTINAL: Denies nausea, vomiting, diarrhea, abdominal pain.  GENITOURINARY: No dysuria, hematuria, or incontinence.  ENDOCRINE: Denies polyuria, nocturia. Has a chronic history of hypothyroidism. Denies any history of diabetes mellitus.  HEMATOLOGIC AND LYMPHATIC: No anemia, easy bruising, bleeding.  INTEGUMENTARY: No acne, rash, lesions.  MUSCULOSKELETAL: Complaining of worsening of acute low back pain after she sustained a 4 weeks ago.  NEUROLOGICAL: Denies any vertigo, ataxia. Motor and sensory are grossly intact. Reflexes are 2+.  EXTREMITIES: Weak.  HOME MEDICATION LIST:  Prevacid 50 mg p.o. once daily a.m. Lipitor orally once a day at bedtime, dose was written. Elavil once a day, Bayer low-dose aspirin 81 mg once daily. PHYSICAL EXAMINATION: VITAL SIGNS: Temperature 98 degrees Fahrenheit, pulse 86, respirations 18, blood pressure 143/75, pulse oximetry is 98% .  GENERAL APPEARANCE: Not in acute distress. Moderately built and nourished.  HEENT: Normocephalic, atraumatic. Pupils are equally reacting to light and accommodation. No scleral icterus. No conjunctival injection. No sinus tenderness. No postnasal drip. Moist mucous membranes.  NECK: Supple. No JVD or thyromegaly. Range of motion is intact.  LUNGS: Clear to auscultation bilaterally. No accessory muscle use and no anterior chest wall tenderness on palpation, but somewhat decreased air entry as the patient is guarding  from low  back pain.  GASTROINTESTINAL: Soft. Bowel sounds are positive in all four  quadrants. Nontender, nondistended. No hepatosplenomegaly. No masses.   NEUROLOGIC: Awake, alert and oriented x 3. Cranial nerves II through XII are grossly intact. Motor and sensory also intact.  Pedal pulses are 2+. No peripheral edema.  MUSCULOSKELETAL: No joint effusion. Complaining of acute on chronic low back pain. No erythema .  EXTREMITIES: No edema. No cyanosis. No clubbing.  SKIN: Warm to touch. Normal turgor, no rashes, no lesions. Touch sensation is intact, pain  sensation is intact.   LABORATORY AND IMAGING STUDIES: CT of the abdomen and pelvis with contrast has revealed acute progression of a compression fracture at L4. Stable compression fracture at L3. No acute process demonstrated in the abdomen and pelvis.   LFTs: Albumin is low at 3.2, alkaline phosphatase 129 , ALT 71. Chem-8 glucose 105, BUN 23. The rest of the Chem-8 is normal including calcium. CBC is normal.   Urinalysis: Straw in color, clear in appearance, blood nitrates and leuk esterase are negative, protein is negative   ASSESSMENT AND PLAN: A 81 year old pleasant Caucasian female brought into the ED for acute on chronic low back pain after she sustained a fall approximately four weeks  will be admitted with the following assessment and plan.  1. Acute on chronic low back pain after she had a fall four weeks ago with a new L4 compression fracture. We will admit her for pain management. Consult orthopedics for possible kyphoplasty consultation. Eventually, we will  consult PT.  2. Osteoporosis.  3. Gastroesophageal reflux disease. Continue Pepcid  while she is in the hospital. 4. Hypothyroidism.  Continue Synthroid. Home medication dose needs to be clarified.  5. Hyperlipidemia. Continue statin.  6. We will provide gastrointestinal and deep vein thrombosis prophylaxis.  7. CODE STATUS: She is full code. Daughter is the medical power of attorney.   TIME SPENT ON THE ADMISSION: 50  minutes.   ____________________________ Nicholes Mango, MD ag:sg D: 10/22/2013 07:10:38 ET T: 10/22/2013 10:14:55 ET JOB#: [id number]  cc: Nicholes Mango, MD, <Dictator> Nicholes Mango MD ELECTRONICALLY SIGNED 11/01/2013 0:40

## 2014-08-22 NOTE — Consult Note (Signed)
Brief Consult Note: Diagnosis: L4 compression fracture.   Patient was seen by consultant.   Consult note dictated.   Recommend to proceed with surgery or procedure.   Orders entered.   Comments: plan kyphoplasty later today.  Electronic Signatures: Laurene Footman (MD)  (Signed 24-Jun-15 12:05)  Authored: Brief Consult Note   Last Updated: 24-Jun-15 12:05 by Laurene Footman (MD)

## 2014-08-22 NOTE — Consult Note (Signed)
PATIENT NAME:  Angela Howe, Angela Howe MR#:  [id number] DATE OF BIRTH:  08/06/1925  DATE OF CONSULTATION:  10/22/2013  CONSULTING PHYSICIAN:  Laurene Footman, MD  REASON FOR CONSULTATION: L4 compression fracture.   HISTORY OF PRESENT ILLNESS: The patient is an 81 year old who had a fall approximately a month ago. Initially, x-rays were negative. She has been having progressively increasing pain, with subsequent x-ray and CT, she has had progression of a compression fracture at L4. She has history of prior L3 kyphoplasty done in 2012 in [place] and did very well with that. She is having excruciating pain. She is unable to get out of bed secondary to pain, and that is why she is hospitalized.   PHYSICAL EXAMINATION: She is neurologically intact. She is very tender to palpation in the low back, with tenderness to percussion at L4.   Risks, benefits and possible complications were discussed. The patient has undergone a kyphoplasty in the past and understands. She would like to get this done so she can be mobilized. Will try to have surgery completed this evening. Her Lovenox was given last night and will be held tonight.   ____________________________ Laurene Footman, MD mjm:lb D: 10/22/2013 12:05:13 ET T: 10/22/2013 12:34:13 ET JOB#: [id number]  cc: Laurene Footman, MD, <Dictator> Laurene Footman MD ELECTRONICALLY SIGNED 10/22/2013 19:11

## 2014-08-22 NOTE — Discharge Summary (Signed)
PATIENT NAME:  Angela Howe, Angela Howe MR#:  [id number] DATE OF BIRTH:  1926/01/15  DATE OF ADMISSION:  10/22/2013 DATE OF DISCHARGE:  10/23/2013  For a detailed note, please take a look at the history and physical done on admission by Dr. Margaretmary Eddy.   DIAGNOSES AT DISCHARGE: Is as follows:  1. Acute back pain due to an L4 compression fracture status post kyphoplasty.  2. Hyperlipidemia.  3. Gastroesophageal reflux disease.  4. Hypothyroidism.   DIET: The patient is being discharged on a low-sodium, low-fat diet.   ACTIVITY: As tolerated.   FOLLOWUP: The patient is being discharged home with home health physical therapy services. Follow up with Dr. Einar Pheasant in the next 1-2 weeks.    DISCHARGE MEDICATIONS:  Amitriptyline 50 mg at bedtime, aspirin 81 mg daily, Prevacid 15 mg daily, Aleve 220 mg 2 tablets daily, Synthroid 1 tablet daily, Lipitor 20 mg daily, Tylenol with hydrocodone 1 tablet q. 4 hours as needed for pain.   [place] COURSE: Dr. Hessie Knows from orthopedics.   PERTINENT STUDIES DONE DURING THE HOSPITAL COURSE: CT scan of the abdomen and pelvis done with contrast showing acute progression of the compression fracture at L4, stable compression fracture at L3.   HOSPITAL COURSE: This is an 81 year old female who presented to the hospital with acute on chronic back pain.  1. Acute on chronic back pain. This was likely secondary to the compression fracture. The patient apparently had a fall 2 weeks ago. Had been having some back pain but has been progressively getting worse to a point where she could not walk. She presented to the ER, underwent a CT scan which showed an acute left L4 compression fracture. Orthopedics was consulted. Patient was seen by Dr. Rudene Christians who recommended kyphoplasty. Patient underwent kyphoplasty on 06/24 and is currently doing well. She just ambulated with physical therapy and did well; therefore is being arranged with home health physical therapy  at this point. She will continue as needed Norco for pain.  2. Gastroesophageal reflux disease. The patient was maintained on ranitidine. She will resume her Prevacid upon discharge. 3. Hypothyroidism. The patient was maintained on her Synthroid. She will resume that.  4. Depression. The patient was maintained on her Elavil and she will continue that.  5. History of coronary artery disease, status post bypass. The patient had no acute chest pain. She will continue her Lipitor as stated.  The patient is being discharged home with home health services.    TIME SPENT: 35 minutes.    ____________________________ Belia Heman. Verdell Carmine, MD vjs:dd D: 10/23/2013 15:47:55 ET T: 10/23/2013 20:01:38 ET JOB#: [id number]  cc: Belia Heman. Verdell Carmine, MD, <Dictator> Einar Pheasant, MD Henreitta Leber MD ELECTRONICALLY SIGNED 10/28/2013 20:43

## 2014-08-22 NOTE — Op Note (Signed)
PATIENT NAME:  JEANETT, Angela Howe MR#:  [id number] DATE OF BIRTH:  [birth year]  DATE OF PROCEDURE:  10/22/2013  PREOPERATIVE DIAGNOSIS: L4 compression fracture.   POSTOPERATIVE DIAGNOSIS: L4 compression fracture.   PROCEDURE: Biopsy and kyphoplasty of L4.   SURGEON: Hessie Knows, M.D.   ANESTHESIA: MAC.   DESCRIPTION OF PROCEDURE: The patient was brought to the operating room and after adequate anesthesia was obtained the patient was placed prone, C-arm was brought in and good visualization was obtained in both AP and lateral projections. After a timeout procedure and patient identification completed, the skin was prepped with alcohol and 10 mL of 1% Xylocaine was infiltrated subcutaneously for initial local anesthetic. Next, the back was prepped and draped in the usual sterile fashion. Repeat timeout procedure was carried out. A spinal needle was used on the right side getting down to the pedicle and injecting 20 mL of local anesthetic, 10 mL of 1% Xylocaine and 10 mL of 0.5% Sensorcaine with epinephrine. A small stab incision was made and a trocar advanced to the pedicle and using a transpedicular approach the vertebral body was entered and a vertebral biopsy was obtained. Drilling was carried out and then a balloon inserted and inflated to approximately 4 mL elevating the superior endplate. Cement was mixed and when it was of the appropriate consistency the cement was inserted with approximately 4.5 mL being placed, filling the vertebral body well and getting good interdigitation across the fracture line. After this had been completed, the insertion tube was removed and permanent C-arm views were obtained in AP and lateral projections. The wound was then closed with Dermabond and covered with a Band-Aid. The patient was sent to the recovery room in stable condition.   ESTIMATED BLOOD LOSS: Minimal.   COMPLICATIONS: None.   SPECIMEN: L4 vertebral body biopsy.   ____________________________ Laurene Footman, MD mjm:sb D: 10/22/2013 20:04:26 ET T: 10/23/2013 10:01:43 ET JOB#: [id number]  cc: Laurene Footman, MD, <Dictator> Laurene Footman MD ELECTRONICALLY SIGNED 10/23/2013 17:54

## 2014-09-16 DIAGNOSIS — M5416 Radiculopathy, lumbar region: Secondary | ICD-10-CM | POA: Diagnosis not present

## 2014-09-16 DIAGNOSIS — M5136 Other intervertebral disc degeneration, lumbar region: Secondary | ICD-10-CM | POA: Diagnosis not present

## 2014-09-16 DIAGNOSIS — M4806 Spinal stenosis, lumbar region: Secondary | ICD-10-CM | POA: Diagnosis not present

## 2014-09-24 DIAGNOSIS — H3531 Nonexudative age-related macular degeneration: Secondary | ICD-10-CM | POA: Diagnosis not present

## 2014-10-08 ENCOUNTER — Other Ambulatory Visit (INDEPENDENT_AMBULATORY_CARE_PROVIDER_SITE_OTHER): Payer: Medicare Other

## 2014-10-08 DIAGNOSIS — D649 Anemia, unspecified: Secondary | ICD-10-CM | POA: Diagnosis not present

## 2014-10-08 DIAGNOSIS — I10 Essential (primary) hypertension: Secondary | ICD-10-CM

## 2014-10-08 DIAGNOSIS — E785 Hyperlipidemia, unspecified: Secondary | ICD-10-CM

## 2014-10-08 LAB — CBC WITH DIFFERENTIAL/PLATELET
Basophils Absolute: 0 10*3/uL (ref 0.0–0.1)
Basophils Relative: 0.5 % (ref 0.0–3.0)
Eosinophils Absolute: 0.2 10*3/uL (ref 0.0–0.7)
Eosinophils Relative: 2.3 % (ref 0.0–5.0)
HCT: 35.3 % — ABNORMAL LOW (ref 36.0–46.0)
HEMOGLOBIN: 11.8 g/dL — AB (ref 12.0–15.0)
LYMPHS PCT: 35 % (ref 12.0–46.0)
Lymphs Abs: 2.3 10*3/uL (ref 0.7–4.0)
MCHC: 33.3 g/dL (ref 30.0–36.0)
MCV: 89.2 fl (ref 78.0–100.0)
MONOS PCT: 5.9 % (ref 3.0–12.0)
Monocytes Absolute: 0.4 10*3/uL (ref 0.1–1.0)
Neutro Abs: 3.8 10*3/uL (ref 1.4–7.7)
Neutrophils Relative %: 56.3 % (ref 43.0–77.0)
Platelets: 286 10*3/uL (ref 150.0–400.0)
RBC: 3.96 Mil/uL (ref 3.87–5.11)
RDW: 14.2 % (ref 11.5–15.5)
WBC: 6.7 10*3/uL (ref 4.0–10.5)

## 2014-10-08 LAB — FERRITIN: Ferritin: 30.7 ng/mL (ref 10.0–291.0)

## 2014-10-08 LAB — LIPID PANEL
CHOLESTEROL: 122 mg/dL (ref 0–200)
HDL: 45.2 mg/dL (ref >39.00)
LDL Cholesterol: 48 mg/dL (ref 0–99)
NonHDL: 76.8
Total CHOL/HDL Ratio: 3
Triglycerides: 146 mg/dL (ref 0.0–149.0)
VLDL: 29.2 mg/dL (ref 0.0–40.0)

## 2014-10-08 LAB — HEPATIC FUNCTION PANEL
ALT: 12 U/L (ref 0–35)
AST: 19 U/L (ref 0–37)
Albumin: 3.6 g/dL (ref 3.5–5.2)
Alkaline Phosphatase: 88 U/L (ref 39–117)
BILIRUBIN DIRECT: 0.1 mg/dL (ref 0.0–0.3)
BILIRUBIN TOTAL: 0.5 mg/dL (ref 0.2–1.2)
Total Protein: 5.9 g/dL — ABNORMAL LOW (ref 6.0–8.3)

## 2014-10-08 LAB — BASIC METABOLIC PANEL
BUN: 23 mg/dL (ref 6–23)
CHLORIDE: 106 meq/L (ref 96–112)
CO2: 26 mEq/L (ref 19–32)
Calcium: 8.8 mg/dL (ref 8.4–10.5)
Creatinine, Ser: 0.98 mg/dL (ref 0.40–1.20)
GFR: 57.09 mL/min — AB (ref >60.00)
GLUCOSE: 80 mg/dL (ref 70–99)
Potassium: 4.3 mEq/L (ref 3.5–5.1)
Sodium: 140 mEq/L (ref 135–145)

## 2014-10-12 ENCOUNTER — Encounter: Payer: Self-pay | Admitting: Internal Medicine

## 2014-10-12 ENCOUNTER — Ambulatory Visit (INDEPENDENT_AMBULATORY_CARE_PROVIDER_SITE_OTHER): Payer: Medicare Other | Admitting: Internal Medicine

## 2014-10-12 VITALS — BP 118/80 | HR 75 | Temp 98.0°F | Ht 62.0 in | Wt 124.0 lb

## 2014-10-12 DIAGNOSIS — Z23 Encounter for immunization: Secondary | ICD-10-CM | POA: Diagnosis not present

## 2014-10-12 DIAGNOSIS — I2510 Atherosclerotic heart disease of native coronary artery without angina pectoris: Secondary | ICD-10-CM | POA: Diagnosis not present

## 2014-10-12 DIAGNOSIS — M545 Low back pain: Secondary | ICD-10-CM

## 2014-10-12 DIAGNOSIS — Z853 Personal history of malignant neoplasm of breast: Secondary | ICD-10-CM

## 2014-10-12 DIAGNOSIS — I10 Essential (primary) hypertension: Secondary | ICD-10-CM | POA: Diagnosis not present

## 2014-10-12 DIAGNOSIS — D649 Anemia, unspecified: Secondary | ICD-10-CM

## 2014-10-12 DIAGNOSIS — Z0000 Encounter for general adult medical examination without abnormal findings: Secondary | ICD-10-CM

## 2014-10-12 DIAGNOSIS — E039 Hypothyroidism, unspecified: Secondary | ICD-10-CM

## 2014-10-12 DIAGNOSIS — E785 Hyperlipidemia, unspecified: Secondary | ICD-10-CM

## 2014-10-12 DIAGNOSIS — C50919 Malignant neoplasm of unspecified site of unspecified female breast: Secondary | ICD-10-CM

## 2014-10-12 NOTE — Progress Notes (Signed)
Pre visit review using our clinic review tool, if applicable. No additional management support is needed unless otherwise documented below in the visit note. 

## 2014-10-12 NOTE — Progress Notes (Signed)
Patient ID: Angela Howe, female   DOB: 1926-01-21, 81 y.o.   MRN: [id number]   Subjective:    Patient ID: Angela Howe, female    DOB: 05-Jun-1925, 81 y.o.   MRN: [id number]  HPI  Patient here for a scheduled follow up. She has been seeing Dr Sharlet Salina.  Taking hydrocodone.  Is helping control the pain in her back.  Eating and drinking well.  No nausea or vomiting.  Breathing stable.  Bowels stable.     Past Medical History  Diagnosis Date  . CAD (coronary artery disease)   . Orthostatic hypotension   . Syncope   . Breast cancer   . GERD (gastroesophageal reflux disease)   . Osteoarthritis   . Degenerative disc disease   . Hypothyroidism   . Hyperlipidemia     Previous intolerance of statins  . Peptic ulcer disease     Current Outpatient Prescriptions on File Prior to Visit  Medication Sig Dispense Refill  . acetaminophen (TYLENOL) 650 MG CR tablet Take 650 mg by mouth every 8 (eight) hours as needed.      Marland Kitchen amitriptyline (ELAVIL) 50 MG tablet Take 1 tablet (50 mg total) by mouth at bedtime. 90 tablet 2  . aspirin 81 MG EC tablet Take 81 mg by mouth daily.      Marland Kitchen atorvastatin (LIPITOR) 20 MG tablet Take 1 tablet (20 mg total) by mouth daily. 90 tablet 2  . Calcium-Vitamin D 600-200 MG-UNIT per tablet Take 1 tablet by mouth 2 (two) times daily.      . citalopram (CELEXA) 20 MG tablet Take 1 tablet (20 mg total) by mouth daily. 90 tablet 1  . fludrocortisone (FLORINEF) 0.1 MG tablet Take 1 tablet (0.1 mg total) by mouth daily. 30 tablet 2  . lansoprazole (PREVACID) 30 MG capsule Take 30 mg by mouth daily.      Marland Kitchen levothyroxine (SYNTHROID) 50 MCG tablet Take 1 tablet (50 mcg total) by mouth daily. 90 tablet 1  . naproxen sodium (ANAPROX) 220 MG tablet Take 220 mg by mouth 2 (two) times daily with a meal.    . nitroGLYCERIN (NITROSTAT) 0.4 MG SL tablet Place 1 tablet (0.4 mg total) under the tongue every 5 (five) minutes as needed for chest pain. 25 tablet 0   No current  facility-administered medications on file prior to visit.    Review of Systems  Constitutional: Negative for appetite change and unexpected weight change.  HENT: Negative for congestion and sinus pressure.   Respiratory: Negative for cough, chest tightness and shortness of breath.   Cardiovascular: Negative for chest pain, palpitations and leg swelling.  Gastrointestinal: Negative for nausea, vomiting and diarrhea.  Musculoskeletal: Positive for back pain (seeing Dr Sharlet Salina now.  on pain medication.  better. ). Negative for joint swelling.  Skin: Negative for color change and rash.  Neurological: Negative for dizziness, light-headedness and headaches.  Psychiatric/Behavioral: Negative for dysphoric mood and agitation.       Objective:     Blood pressure recheck:  148-150/84  Physical Exam  Constitutional: She appears well-developed and well-nourished. No distress.  HENT:  Nose: Nose normal.  Mouth/Throat: Oropharynx is clear and moist.  Neck: Neck supple. No thyromegaly present.  Cardiovascular: Normal rate and regular rhythm.   Pulmonary/Chest: Breath sounds normal. No respiratory distress. She has no wheezes.  Abdominal: Soft. Bowel sounds are normal. There is no tenderness.  Musculoskeletal: She exhibits no edema or tenderness.  Lymphadenopathy:    She has no  cervical adenopathy.  Skin: No rash noted. No erythema.  Psychiatric: She has a normal mood and affect. Her behavior is normal.    BP 118/80 mmHg  Pulse 75  Temp(Src) 98 F (36.7 C) (Oral)  Ht 5\' 2"  (1.575 m)  Wt 124 lb (56.246 kg)  BMI 22.67 kg/m2  SpO2 96% Wt Readings from Last 3 Encounters:  10/12/14 124 lb (56.246 kg)  07/10/14 123 lb 8 oz (56.019 kg)  05/05/14 127 lb 4 oz (57.72 kg)     Lab Results  Component Value Date   WBC 6.7 10/08/2014   HGB 11.8* 10/08/2014   HCT 35.3* 10/08/2014   PLT 286.0 10/08/2014   GLUCOSE 80 10/08/2014   CHOL 122 10/08/2014   TRIG 146.0 10/08/2014   HDL 45.20  10/08/2014   LDLCALC 48 10/08/2014   ALT 12 10/08/2014   AST 19 10/08/2014   NA 140 10/08/2014   K 4.3 10/08/2014   CL 106 10/08/2014   CREATININE 0.98 10/08/2014   BUN 23 10/08/2014   CO2 26 10/08/2014   TSH 1.46 05/05/2014   INR 1.1 03/16/2012       Assessment & Plan:   Problem List Items Addressed This Visit    Anemia    Follow cbc and ferritin.        Back pain    Seeing Dr Sharlet Salina.  On pain medication.  Helping.       Relevant Medications   HYDROcodone-acetaminophen (NORCO/VICODIN) 5-325 MG per tablet   Breast cancer    Overdue mammogram.  Needs.  Schedule.        Relevant Medications   HYDROcodone-acetaminophen (NORCO/VICODIN) 5-325 MG per tablet   CAD, NATIVE VESSEL    Continue risk factor modification.        Health care maintenance    Physical 05/06/14.  Mammogram 09/10/13 - [facility].  Schedule f/u mammogram.        Hyperlipemia    Low cholesterol diet and exercise.  Follow lipid panel.  On lipitor.        HYPERTENSION, BENIGN    Blood pressure as outlined. Follow pressures.        Hypothyroidism    On thyroid replacement.  Follow tsh.        Other Visit Diagnoses    History of breast cancer    -  Primary    Need for prophylactic vaccination against Streptococcus pneumoniae (pneumococcus)        Relevant Orders    Pneumococcal conjugate vaccine 13-valent        Einar Pheasant, MD

## 2014-10-16 ENCOUNTER — Other Ambulatory Visit: Payer: Self-pay | Admitting: Internal Medicine

## 2014-10-16 ENCOUNTER — Ambulatory Visit
Admission: RE | Admit: 2014-10-16 | Discharge: 2014-10-16 | Disposition: A | Discharge status: Home / Self Care | Payer: Medicare Other | Source: Ambulatory Visit | Attending: Internal Medicine | Admitting: Internal Medicine

## 2014-10-16 DIAGNOSIS — Z1231 Encounter for screening mammogram for malignant neoplasm of breast: Secondary | ICD-10-CM | POA: Insufficient documentation

## 2014-10-16 DIAGNOSIS — Z853 Personal history of malignant neoplasm of breast: Secondary | ICD-10-CM

## 2014-10-19 ENCOUNTER — Encounter: Payer: Self-pay | Admitting: Internal Medicine

## 2014-10-19 NOTE — Assessment & Plan Note (Signed)
Continue risk factor modification 

## 2014-10-19 NOTE — Assessment & Plan Note (Signed)
Overdue mammogram.  Needs.  Schedule.

## 2014-10-19 NOTE — Assessment & Plan Note (Signed)
Seeing Dr Sharlet Salina.  On pain medication.  Helping.

## 2014-10-19 NOTE — Assessment & Plan Note (Signed)
Low cholesterol diet and exercise.  Follow lipid panel.  On lipitor.   

## 2014-10-19 NOTE — Assessment & Plan Note (Signed)
Physical 05/06/14.  Mammogram 09/10/13 - [facility] I.  Schedule f/u mammogram.

## 2014-10-19 NOTE — Assessment & Plan Note (Signed)
On thyroid replacement.  Follow tsh.  

## 2014-10-19 NOTE — Assessment & Plan Note (Signed)
Blood pressure as outlined.  Follow pressures.  

## 2014-10-19 NOTE — Assessment & Plan Note (Signed)
Follow cbc and ferritin.  

## 2014-10-25 ENCOUNTER — Encounter: Payer: Self-pay | Admitting: Internal Medicine

## 2014-12-16 DIAGNOSIS — M4806 Spinal stenosis, lumbar region: Secondary | ICD-10-CM | POA: Diagnosis not present

## 2014-12-16 DIAGNOSIS — M5136 Other intervertebral disc degeneration, lumbar region: Secondary | ICD-10-CM | POA: Diagnosis not present

## 2014-12-16 DIAGNOSIS — M5416 Radiculopathy, lumbar region: Secondary | ICD-10-CM | POA: Diagnosis not present

## 2015-01-12 ENCOUNTER — Encounter: Payer: Self-pay | Admitting: Internal Medicine

## 2015-01-12 ENCOUNTER — Ambulatory Visit (INDEPENDENT_AMBULATORY_CARE_PROVIDER_SITE_OTHER): Payer: Medicare Other | Admitting: Internal Medicine

## 2015-01-12 VITALS — BP 128/70 | HR 75 | Temp 97.7°F | Ht 62.0 in | Wt 121.8 lb

## 2015-01-12 DIAGNOSIS — R634 Abnormal weight loss: Secondary | ICD-10-CM

## 2015-01-12 DIAGNOSIS — Z23 Encounter for immunization: Secondary | ICD-10-CM

## 2015-01-12 DIAGNOSIS — D649 Anemia, unspecified: Secondary | ICD-10-CM

## 2015-01-12 DIAGNOSIS — E785 Hyperlipidemia, unspecified: Secondary | ICD-10-CM | POA: Diagnosis not present

## 2015-01-12 DIAGNOSIS — C50919 Malignant neoplasm of unspecified site of unspecified female breast: Secondary | ICD-10-CM

## 2015-01-12 DIAGNOSIS — I2510 Atherosclerotic heart disease of native coronary artery without angina pectoris: Secondary | ICD-10-CM

## 2015-01-12 DIAGNOSIS — Z79899 Other long term (current) drug therapy: Secondary | ICD-10-CM

## 2015-01-12 DIAGNOSIS — I951 Orthostatic hypotension: Secondary | ICD-10-CM

## 2015-01-12 DIAGNOSIS — I10 Essential (primary) hypertension: Secondary | ICD-10-CM | POA: Diagnosis not present

## 2015-01-12 DIAGNOSIS — M545 Low back pain: Secondary | ICD-10-CM

## 2015-01-12 DIAGNOSIS — E039 Hypothyroidism, unspecified: Secondary | ICD-10-CM | POA: Diagnosis not present

## 2015-01-12 LAB — BASIC METABOLIC PANEL
BUN: 24 mg/dL — ABNORMAL HIGH (ref 6–23)
CHLORIDE: 109 meq/L (ref 96–112)
CO2: 26 mEq/L (ref 19–32)
Calcium: 8.4 mg/dL (ref 8.4–10.5)
Creatinine, Ser: 0.93 mg/dL (ref 0.40–1.20)
GFR: 60.61 mL/min (ref >60.00)
Glucose, Bld: 79 mg/dL (ref 70–99)
POTASSIUM: 4 meq/L (ref 3.5–5.1)
Sodium: 142 mEq/L (ref 135–145)

## 2015-01-12 LAB — CBC WITH DIFFERENTIAL/PLATELET
BASOS ABS: 0 10*3/uL (ref 0.0–0.1)
BASOS PCT: 0.4 % (ref 0.0–3.0)
EOS ABS: 0.1 10*3/uL (ref 0.0–0.7)
Eosinophils Relative: 1.8 % (ref 0.0–5.0)
HCT: 34.3 % — ABNORMAL LOW (ref 36.0–46.0)
Hemoglobin: 11.5 g/dL — ABNORMAL LOW (ref 12.0–15.0)
LYMPHS ABS: 2.2 10*3/uL (ref 0.7–4.0)
LYMPHS PCT: 29.4 % (ref 12.0–46.0)
MCHC: 33.4 g/dL (ref 30.0–36.0)
MCV: 89.2 fl (ref 78.0–100.0)
Monocytes Absolute: 0.5 10*3/uL (ref 0.1–1.0)
Monocytes Relative: 6.9 % (ref 3.0–12.0)
NEUTROS ABS: 4.6 10*3/uL (ref 1.4–7.7)
NEUTROS PCT: 61.5 % (ref 43.0–77.0)
PLATELETS: 240 10*3/uL (ref 150.0–400.0)
RBC: 3.85 Mil/uL — ABNORMAL LOW (ref 3.87–5.11)
RDW: 15.1 % (ref 11.5–15.5)
WBC: 7.5 10*3/uL (ref 4.0–10.5)

## 2015-01-12 LAB — FERRITIN: Ferritin: 30.3 ng/mL (ref 10.0–291.0)

## 2015-01-12 LAB — TSH: TSH: 1.73 u[IU]/mL (ref 0.35–4.50)

## 2015-01-12 LAB — HEPATIC FUNCTION PANEL
ALT: 12 U/L (ref 0–35)
AST: 19 U/L (ref 0–37)
Albumin: 3.4 g/dL — ABNORMAL LOW (ref 3.5–5.2)
Alkaline Phosphatase: 69 U/L (ref 39–117)
BILIRUBIN DIRECT: 0.1 mg/dL (ref 0.0–0.3)
Total Bilirubin: 0.3 mg/dL (ref 0.2–1.2)
Total Protein: 6.1 g/dL (ref 6.0–8.3)

## 2015-01-12 LAB — VITAMIN B12: Vitamin B-12: 332 pg/mL (ref 211–911)

## 2015-01-12 MED ORDER — FLUDROCORTISONE ACETATE 0.1 MG PO TABS: 0.1000 mg | ORAL_TABLET | Freq: Every day | ORAL | Status: DC

## 2015-01-12 NOTE — Progress Notes (Signed)
Patient ID: Angela Howe, female   DOB: Jul 31, 1925, 81 y.o.   MRN: [id number]   Subjective:    Patient ID: Angela Howe, female    DOB: 15-Jul-1925, 81 y.o.   MRN: [id number]  HPI  Patient here to follow up on her cholesterol, blood pressure, breast cancer and her CAD.  She feels things are relatively stable.  She is able to get out.  Tries to stay active.  No syncope or near syncope.  No chest pain or tightness.  No sob.  Eating and drinking well.  Seeing Dr Sharlet Salina for her back.  See his note for details.  Chronic pain.  Bowels stable.     Past Medical History  Diagnosis Date  . CAD (coronary artery disease)   . Orthostatic hypotension   . Syncope   . Breast cancer   . GERD (gastroesophageal reflux disease)   . Osteoarthritis   . Degenerative disc disease   . Hypothyroidism   . Hyperlipidemia     Previous intolerance of statins  . Peptic ulcer disease    Past Surgical History  Procedure Laterality Date  . Breast lumpectomy  2006  . Abdominal hysterectomy  1970  . Nasal sinus surgery    . Rotator cuff repair    . Appendectomy    . Coronary artery bypass graft  05/2006    x3  . Cholecystectomy    . Biopsy breast  2014  . Breast excisional biopsy Left 2014    neg  . Breast excisional biopsy Left 2006    postive radation   Family History  Problem Relation Age of Onset  . Heart attack Sister   . Pancreatic cancer Brother    Social History   Social History  . Marital Status: Widowed    Spouse Name: N/A  . Number of Children: N/A  . Years of Education: N/A   Occupational History  . Retired    Social History Main Topics  . Smoking status: Never Smoker   . Smokeless tobacco: Never Used  . Alcohol Use: No  . Drug Use: No  . Sexual Activity: Not Asked   Other Topics Concern  . None   Social History Narrative   Widowed   Gets regular exercise    Outpatient Encounter Prescriptions as of 01/12/2015  Medication Sig  . acetaminophen (TYLENOL) 650  MG CR tablet Take 650 mg by mouth every 8 (eight) hours as needed.    Marland Kitchen amitriptyline (ELAVIL) 50 MG tablet Take 1 tablet (50 mg total) by mouth at bedtime.  Marland Kitchen aspirin 81 MG EC tablet Take 81 mg by mouth daily.    Marland Kitchen atorvastatin (LIPITOR) 20 MG tablet Take 1 tablet (20 mg total) by mouth daily.  . Calcium-Vitamin D 600-200 MG-UNIT per tablet Take 1 tablet by mouth 2 (two) times daily.    . citalopram (CELEXA) 20 MG tablet Take 1 tablet (20 mg total) by mouth daily.  . fludrocortisone (FLORINEF) 0.1 MG tablet Take 1 tablet (0.1 mg total) by mouth daily.  Marland Kitchen HYDROcodone-acetaminophen (NORCO/VICODIN) 5-325 MG per tablet Take 5-325 tablets by mouth 2 (two) times daily as needed.  . lansoprazole (PREVACID) 30 MG capsule Take 30 mg by mouth daily.    Marland Kitchen levothyroxine (SYNTHROID) 50 MCG tablet Take 1 tablet (50 mcg total) by mouth daily.  . naproxen sodium (ANAPROX) 220 MG tablet Take 220 mg by mouth 2 (two) times daily with a meal.  . nitroGLYCERIN (NITROSTAT) 0.4 MG SL tablet Place 1  tablet (0.4 mg total) under the tongue every 5 (five) minutes as needed for chest pain.  . [DISCONTINUED] fludrocortisone (FLORINEF) 0.1 MG tablet Take 1 tablet (0.1 mg total) by mouth daily.   No facility-administered encounter medications on file as of 01/12/2015.    Review of Systems  Constitutional: Negative for appetite change and unexpected weight change.  HENT: Negative for congestion and sinus pressure.   Eyes: Negative for pain and visual disturbance.  Respiratory: Negative for cough, chest tightness and shortness of breath.   Cardiovascular: Negative for chest pain, palpitations and leg swelling.  Gastrointestinal: Negative for nausea, vomiting, abdominal pain and diarrhea.  Genitourinary: Negative for dysuria and difficulty urinating.  Musculoskeletal: Positive for back pain (chronic back pain.  seeing Dr Sharlet Salina. ). Negative for joint swelling.  Skin: Negative for color change and rash.  Neurological:  Negative for dizziness, light-headedness and headaches.  Psychiatric/Behavioral: Negative for dysphoric mood and agitation.       Objective:    Physical Exam  Constitutional: She appears well-developed and well-nourished. No distress.  HENT:  Nose: Nose normal.  Mouth/Throat: Oropharynx is clear and moist.  Eyes: Conjunctivae are normal. Right eye exhibits no discharge. Left eye exhibits no discharge.  Neck: Neck supple. No thyromegaly present.  Cardiovascular: Normal rate and regular rhythm.   Pulmonary/Chest: Breath sounds normal. No respiratory distress. She has no wheezes.  Abdominal: Soft. Bowel sounds are normal. There is no tenderness.  Musculoskeletal: She exhibits no edema or tenderness.  Lymphadenopathy:    She has no cervical adenopathy.  Skin: No rash noted. No erythema.  Psychiatric: She has a normal mood and affect. Her behavior is normal.    BP 128/70 mmHg  Pulse 75  Temp(Src) 97.7 F (36.5 C) (Oral)  Ht 5\' 2"  (1.575 m)  Wt 121 lb 12 oz (55.225 kg)  BMI 22.26 kg/m2  SpO2 98% Wt Readings from Last 3 Encounters:  01/12/15 121 lb 12 oz (55.225 kg)  10/12/14 124 lb (56.246 kg)  07/10/14 123 lb 8 oz (56.019 kg)     Lab Results  Component Value Date   WBC 7.5 01/12/2015   HGB 11.5* 01/12/2015   HCT 34.3* 01/12/2015   PLT 240.0 01/12/2015   GLUCOSE 79 01/12/2015   CHOL 122 10/08/2014   TRIG 146.0 10/08/2014   HDL 45.20 10/08/2014   LDLCALC 48 10/08/2014   ALT 12 01/12/2015   AST 19 01/12/2015   NA 142 01/12/2015   K 4.0 01/12/2015   CL 109 01/12/2015   CREATININE 0.93 01/12/2015   BUN 24* 01/12/2015   CO2 26 01/12/2015   TSH 1.73 01/12/2015   INR 1.1 03/16/2012    Mm Screening Breast Tomo Bilateral  10/16/2014   CLINICAL DATA:  Screening. History of left breast cancer and left lumpectomy in 2006.  EXAM: DIGITAL SCREENING BILATERAL MAMMOGRAM WITH 3D TOMO WITH CAD  COMPARISON:  Previous exam(s).  ACR Breast Density Category b: There are scattered  areas of fibroglandular density.  FINDINGS: There are no findings suspicious for malignancy.  Left breast scarring is again noted.  Images were processed with CAD.  IMPRESSION: No mammographic evidence of malignancy. A result letter of this screening mammogram will be mailed directly to the patient.  RECOMMENDATION: Screening mammogram in one year. (Code:SM-B-01Y)  BI-RADS CATEGORY  2: Benign.   Electronically Signed   By: Margarette Canada M.D.   On: 10/16/2014 16:02       Assessment & Plan:   Problem List Items Addressed This  Visit    Anemia    hgb last checked 11.8 (10/08/14).  Recheck cbc with next labs.        Relevant Orders   CBC with Differential/Platelet (Completed)   Ferritin (Completed)   Back pain    Chronic.  Sees Dr Sharlet Salina.        Relevant Medications   fludrocortisone (FLORINEF) 0.1 MG tablet   Breast cancer    Mammogram 6/17/1 - [facility].        Relevant Medications   fludrocortisone (FLORINEF) 0.1 MG tablet   CAD, NATIVE VESSEL    Has been seeing cardiology.  Stable.  Continue risk factor modification.        Hyperlipemia    Low cholesterol diet and exercise.  On atorvastatin.  Continue.  Check lipid panel and liver function tests.   Lab Results  Component Value Date   CHOL 122 10/08/2014   HDL 45.20 10/08/2014   LDLCALC 48 10/08/2014   TRIG 146.0 10/08/2014   CHOLHDL 3 10/08/2014        Relevant Orders   Hepatic function panel (Completed)   HYPERTENSION, BENIGN    Blood pressure under good control.  Continue same medication regimen.  Follow pressures.  Follow metabolic panel.        Relevant Orders   Basic metabolic panel (Completed)   Hypothyroidism    On thyroid replacement.  Follow tsh.        Loss of weight    Discussed with her today.  Nutritional shakes.  Eat regular meals.  Follow.        Relevant Orders   Vitamin B12 (Completed)   TSH (Completed)   ORTHOSTATIC HYPOTENSION    Has been doing well on florinef.  Refilled.         Other  Visit Diagnoses    Encounter for immunization    -  Primary        Einar Pheasant, MD

## 2015-01-12 NOTE — Progress Notes (Signed)
Pre-visit discussion using our clinic review tool. No additional management support is needed unless otherwise documented below in the visit note.  

## 2015-01-13 ENCOUNTER — Encounter: Payer: Self-pay | Admitting: *Deleted

## 2015-01-17 ENCOUNTER — Encounter: Payer: Self-pay | Admitting: Internal Medicine

## 2015-01-17 NOTE — Assessment & Plan Note (Signed)
Has been doing well on florinef.  Refilled.

## 2015-01-17 NOTE — Assessment & Plan Note (Signed)
On thyroid replacement.  Follow tsh.  

## 2015-01-17 NOTE — Assessment & Plan Note (Signed)
Mammogram 6/17/1 - [facility] II.

## 2015-01-17 NOTE — Assessment & Plan Note (Signed)
Chronic.  Sees Dr Chasnis.  

## 2015-01-17 NOTE — Assessment & Plan Note (Signed)
Blood pressure under good control.  Continue same medication regimen.  Follow pressures.  Follow metabolic panel.   

## 2015-01-17 NOTE — Assessment & Plan Note (Signed)
Low cholesterol diet and exercise.  On atorvastatin.  Continue.  Check lipid panel and liver function tests.   Lab Results  Component Value Date   CHOL 122 10/08/2014   HDL 45.20 10/08/2014   LDLCALC 48 10/08/2014   TRIG 146.0 10/08/2014   CHOLHDL 3 10/08/2014

## 2015-01-17 NOTE — Assessment & Plan Note (Signed)
Has been seeing cardiology.  Stable.  Continue risk factor modification.

## 2015-01-17 NOTE — Assessment & Plan Note (Signed)
hgb last checked 11.8 (10/08/14).  Recheck cbc with next labs.

## 2015-01-17 NOTE — Assessment & Plan Note (Signed)
Discussed with her today.  Nutritional shakes.  Eat regular meals.  Follow.

## 2015-02-05 ENCOUNTER — Encounter: Payer: Self-pay | Admitting: Cardiovascular Disease

## 2015-02-05 ENCOUNTER — Ambulatory Visit (INDEPENDENT_AMBULATORY_CARE_PROVIDER_SITE_OTHER): Payer: Medicare Other | Admitting: Cardiovascular Disease

## 2015-02-05 VITALS — BP 134/62 | HR 87 | Ht 62.0 in | Wt 121.0 lb

## 2015-02-05 DIAGNOSIS — M545 Low back pain: Secondary | ICD-10-CM | POA: Diagnosis not present

## 2015-02-05 DIAGNOSIS — R55 Syncope and collapse: Secondary | ICD-10-CM

## 2015-02-05 DIAGNOSIS — I2510 Atherosclerotic heart disease of native coronary artery without angina pectoris: Secondary | ICD-10-CM

## 2015-02-05 DIAGNOSIS — I951 Orthostatic hypotension: Secondary | ICD-10-CM | POA: Diagnosis not present

## 2015-02-05 DIAGNOSIS — I10 Essential (primary) hypertension: Secondary | ICD-10-CM

## 2015-02-05 DIAGNOSIS — E785 Hyperlipidemia, unspecified: Secondary | ICD-10-CM

## 2015-02-05 NOTE — Assessment & Plan Note (Signed)
Currently with no symptoms of angina. No further workup at this time. Continue current medication regimen. 

## 2015-02-05 NOTE — Patient Instructions (Signed)
You are doing well. No medication changes were made.  Please call us if you have new issues that need to be addressed before your next appt.  Your physician wants you to follow-up in: 6 months.  You will receive a reminder letter in the mail two months in advance. If you don't receive a letter, please call our office to schedule the follow-up appointment.   

## 2015-02-05 NOTE — Assessment & Plan Note (Signed)
Chronic back pain. History of falls, vertebral plasty Stable at this time but with periods of chronic pain

## 2015-02-05 NOTE — Progress Notes (Signed)
Patient ID: Angela Howe, female    DOB: [birth year], 81 y.o.   MRN: [id number]  HPI Comments: Angela Howe is a very pleasant 81 year-old woman with history of coronary artery disease, status post bypass grafting in 2008. She also has a history of hypertension, hyperlipidemia, breast cancer status post left lumpectomy, and gastroesophageal reflux disease. maintained on Florinef  for orthostatic hypotension.  admission to the hospital on June 10 2010  for diaphoresis, nausea or, dizziness.  cardiac catheterization showed patent LIMA graft, vein graft to the diagonal, occluded vein graft to the PDA though the native RCA was patent, good LV function. She presents for routine followup of her coronary artery disease  In follow-up, she reports that she is doing well from a cardiac perspective She continued to have significant back discomfort Reports having 4 epidural shots, 2 vertebral plasties Had a terrible fall last year No recent lightheadedness or near syncope Previously was on midodrine. His does not appear to be on her medication list today Blood pressure was low in the past, she felt hot and sweaty  Denies any chest pain concerning for angina. No shortness of breath with exertion  EKG on today's visit shows normal sinus rhythm with rate 82 bpm, no significant ST or T-wave changes Lab work reviewed with her showing total cholesterol 122, LDL 40s  Other past medical history Walks with a walker, She does not need a wheelchair at this time  Prior episode of syncope. She was at [employer] in the checkout when she felt hot, sweaty then had loss of consciousness. She reports it was only for a short period of time. She also reported additional episodes of diaphoresis, feeling hot and sweaty with near syncope. These occurring over  several weeks. On a prior visit,, her blood pressure was 62/40. She was feeling hot, had to sit down . Recheck of her blood pressure was 82/40 on the right. Some  time later recheck her blood pressure by myself was 412 systolic and she was feeling better  History of fall  back x-rays Sep 24 2013 when she fell, repeat x-rays 10/02/2013. Most recent June x-ray suggesting moderate compression fracture of L3. Prior L4 kyphoplasty  History of chronic severe knee pain and neuropathy. She was seen by Dr. Jefm Bryant and told that she needed a hip replacement  Previous episode of chest pain at the end of August 2013. Stress test was ordered to rule out ischemia. This showed anterior wall defect consistent with breast attenuation artifact, normal ejection fraction, unable to exclude mild apical ischemia    Allergies  Allergen Reactions  . Alendronate Sodium   . Prednisone     Insomnia, "makes me crazy"    Outpatient Encounter Prescriptions as of 02/05/2015  Medication Sig  . acetaminophen (TYLENOL) 650 MG CR tablet Take 650 mg by mouth every 8 (eight) hours as needed.    Marland Kitchen amitriptyline (ELAVIL) 50 MG tablet Take 1 tablet (50 mg total) by mouth at bedtime.  Marland Kitchen aspirin 81 MG EC tablet Take 81 mg by mouth daily.    Marland Kitchen atorvastatin (LIPITOR) 20 MG tablet Take 1 tablet (20 mg total) by mouth daily.  . Calcium-Vitamin D 600-200 MG-UNIT per tablet Take 1 tablet by mouth 2 (two) times daily.    . citalopram (CELEXA) 20 MG tablet Take 1 tablet (20 mg total) by mouth daily.  . fludrocortisone (FLORINEF) 0.1 MG tablet Take 1 tablet (0.1 mg total) by mouth daily.  Marland Kitchen HYDROcodone-acetaminophen (NORCO/VICODIN) 5-325 MG  per tablet Take 5-325 tablets by mouth 2 (two) times daily as needed.  . lansoprazole (PREVACID) 30 MG capsule Take 30 mg by mouth daily.    Marland Kitchen levothyroxine (SYNTHROID) 50 MCG tablet Take 1 tablet (50 mcg total) by mouth daily.  . naproxen sodium (ANAPROX) 220 MG tablet Take 220 mg by mouth as needed.   . nitroGLYCERIN (NITROSTAT) 0.4 MG SL tablet Place 1 tablet (0.4 mg total) under the tongue every 5 (five) minutes as needed for chest pain.   No  facility-administered encounter medications on file as of 02/05/2015.    Past Medical History  Diagnosis Date  . CAD (coronary artery disease)   . Orthostatic hypotension   . Syncope   . Breast cancer ([place])   . GERD (gastroesophageal reflux disease)   . Osteoarthritis   . Degenerative disc disease   . Hypothyroidism   . Hyperlipidemia     Previous intolerance of statins  . Peptic ulcer disease     Past Surgical History  Procedure Laterality Date  . Breast lumpectomy  2006  . Abdominal hysterectomy  1970  . Nasal sinus surgery    . Rotator cuff repair    . Appendectomy    . Coronary artery bypass graft  05/2006    x3  . Cholecystectomy    . Biopsy breast  2014  . Breast excisional biopsy Left 2014    neg  . Breast excisional biopsy Left 2006    postive radation    Social History  reports that she has never smoked. She has never used smokeless tobacco. She reports that she does not drink alcohol or use illicit drugs.  Family History family history includes Heart attack in her sister; Pancreatic cancer in her brother.   Review of Systems  Constitutional: Negative.   Respiratory: Negative.   Cardiovascular: Negative.   Gastrointestinal: Negative.   Musculoskeletal: Positive for back pain, arthralgias and gait problem.  Skin: Negative.   Neurological: Negative.   Hematological: Negative.   Psychiatric/Behavioral: Negative.   All other systems reviewed and are negative.  BP 134/62 mmHg  Pulse 87  Ht 5\' 2"  (1.575 m)  Wt 121 lb (54.885 kg)  BMI 22.13 kg/m2  Physical Exam  Constitutional: She is oriented to person, place, and time. She appears well-developed and well-nourished.  HENT:  Head: Normocephalic.  Nose: Nose normal.  Mouth/Throat: Oropharynx is clear and moist.  Eyes: Conjunctivae are normal. Pupils are equal, round, and reactive to light.  Neck: Normal range of motion. Neck supple. No JVD present.  Cardiovascular: Normal rate, regular rhythm, S1  normal, S2 normal, normal heart sounds and intact distal pulses.  Exam reveals no gallop and no friction rub.   No murmur heard. Pulmonary/Chest: Effort normal and breath sounds normal. No respiratory distress. She has no wheezes. She has no rales. She exhibits no tenderness.  Abdominal: Soft. Bowel sounds are normal. She exhibits no distension. There is no tenderness.  Musculoskeletal: Normal range of motion. She exhibits no edema or tenderness.  Lymphadenopathy:    She has no cervical adenopathy.  Neurological: She is alert and oriented to person, place, and time. Coordination normal.  Skin: Skin is warm and dry. No rash noted. No erythema.  Psychiatric: She has a normal mood and affect. Her behavior is normal. Judgment and thought content normal.    Assessment and Plan  Nursing note and vitals reviewed.

## 2015-02-05 NOTE — Assessment & Plan Note (Signed)
Doing well on fludrocortisone, systolic 034 on today's visit. No near syncope or syncope Midodrine currently not on her medication list

## 2015-02-05 NOTE — Assessment & Plan Note (Signed)
Cholesterol is at goal on the current lipid regimen. No changes to the medications were made.  

## 2015-02-05 NOTE — Assessment & Plan Note (Signed)
No recent episodes of lightheadedness, near syncope or syncope Seems to be responding well to blood pressure support

## 2015-02-18 ENCOUNTER — Other Ambulatory Visit: Payer: Self-pay | Admitting: Internal Medicine

## 2015-03-15 ENCOUNTER — Ambulatory Visit: Payer: Medicare Other | Admitting: Internal Medicine

## 2015-03-15 DIAGNOSIS — Z0289 Encounter for other administrative examinations: Secondary | ICD-10-CM

## 2015-03-16 DIAGNOSIS — H353132 Nonexudative age-related macular degeneration, bilateral, intermediate dry stage: Secondary | ICD-10-CM | POA: Diagnosis not present

## 2015-03-22 ENCOUNTER — Encounter: Payer: Self-pay | Admitting: Internal Medicine

## 2015-03-22 ENCOUNTER — Ambulatory Visit (INDEPENDENT_AMBULATORY_CARE_PROVIDER_SITE_OTHER): Payer: Medicare Other | Admitting: Internal Medicine

## 2015-03-22 VITALS — BP 144/70 | HR 91 | Temp 97.6°F | Resp 18 | Ht 62.0 in | Wt 118.0 lb

## 2015-03-22 DIAGNOSIS — R634 Abnormal weight loss: Secondary | ICD-10-CM

## 2015-03-22 DIAGNOSIS — E785 Hyperlipidemia, unspecified: Secondary | ICD-10-CM

## 2015-03-22 DIAGNOSIS — I10 Essential (primary) hypertension: Secondary | ICD-10-CM | POA: Diagnosis not present

## 2015-03-22 DIAGNOSIS — I2510 Atherosclerotic heart disease of native coronary artery without angina pectoris: Secondary | ICD-10-CM | POA: Diagnosis not present

## 2015-03-22 DIAGNOSIS — E039 Hypothyroidism, unspecified: Secondary | ICD-10-CM

## 2015-03-22 DIAGNOSIS — C50919 Malignant neoplasm of unspecified site of unspecified female breast: Secondary | ICD-10-CM

## 2015-03-22 DIAGNOSIS — D649 Anemia, unspecified: Secondary | ICD-10-CM

## 2015-03-22 DIAGNOSIS — M545 Low back pain: Secondary | ICD-10-CM

## 2015-03-22 MED ORDER — FLUDROCORTISONE ACETATE 0.1 MG PO TABS: 0.1000 mg | ORAL_TABLET | Freq: Every day | ORAL | Status: AC

## 2015-03-22 MED ORDER — ATORVASTATIN CALCIUM 20 MG PO TABS: 20.0000 mg | ORAL_TABLET | Freq: Every day | ORAL | Status: DC

## 2015-03-22 NOTE — Progress Notes (Signed)
Pre-visit discussion using our clinic review tool. No additional management support is needed unless otherwise documented below in the visit note.  

## 2015-03-22 NOTE — Progress Notes (Signed)
Patient ID: Angela Howe, female   DOB: Aug 26, 1925, 81 y.o.   MRN: [id number]   Subjective:    Patient ID: Angela Howe, female    DOB: 1925/08/23, 81 y.o.   MRN: [id number]  HPI  Patient with past history of CAD, orthostatic hypotension, breast cancer, DDD, hypercholesterolemia and hypothyroidism.  She comes in today to follow up on these issues.  She is still doing her work and activity around her house.  Her back limits her.  Sees Dr Sharlet Salina.  Taking hydrocodone.  Tolerates this medication.  Bowels are stable.  No chest pain or tightness.  No increased sob.  Just saw Dr Rockey Situ recently.  Felt stable.  Handling stress relatively well.     Past Medical History  Diagnosis Date  . CAD (coronary artery disease)   . Orthostatic hypotension   . Syncope   . Breast cancer ([place])   . GERD (gastroesophageal reflux disease)   . Osteoarthritis   . Degenerative disc disease   . Hypothyroidism   . Hyperlipidemia     Previous intolerance of statins  . Peptic ulcer disease    Past Surgical History  Procedure Laterality Date  . Breast lumpectomy  2006  . Abdominal hysterectomy  1970  . Nasal sinus surgery    . Rotator cuff repair    . Appendectomy    . Coronary artery bypass graft  05/2006    x3  . Cholecystectomy    . Biopsy breast  2014  . Breast excisional biopsy Left 2014    neg  . Breast excisional biopsy Left 2006    postive radation   Family History  Problem Relation Age of Onset  . Heart attack Sister   . Pancreatic cancer Brother    Social History   Social History  . Marital Status: Widowed    Spouse Name: N/A  . Number of Children: N/A  . Years of Education: N/A   Occupational History  . Retired    Social History Main Topics  . Smoking status: Never Smoker   . Smokeless tobacco: Never Used  . Alcohol Use: No  . Drug Use: No  . Sexual Activity: Not Asked   Other Topics Concern  . None   Social History Narrative   Widowed   Gets regular exercise     Outpatient Encounter Prescriptions as of 03/22/2015  Medication Sig  . acetaminophen (TYLENOL) 650 MG CR tablet Take 650 mg by mouth every 8 (eight) hours as needed.    Marland Kitchen amitriptyline (ELAVIL) 50 MG tablet Take 1 tablet (50 mg total) by mouth at bedtime.  Marland Kitchen aspirin 81 MG EC tablet Take 81 mg by mouth daily.    Marland Kitchen atorvastatin (LIPITOR) 20 MG tablet Take 1 tablet (20 mg total) by mouth daily.  . Calcium-Vitamin D 600-200 MG-UNIT per tablet Take 1 tablet by mouth 2 (two) times daily.    . citalopram (CELEXA) 20 MG tablet Take 1 tablet by mouth  daily  . fludrocortisone (FLORINEF) 0.1 MG tablet Take 1 tablet (0.1 mg total) by mouth daily.  Marland Kitchen HYDROcodone-acetaminophen (NORCO/VICODIN) 5-325 MG per tablet Take 5-325 tablets by mouth 2 (two) times daily as needed.  . lansoprazole (PREVACID) 30 MG capsule Take 30 mg by mouth daily.    Marland Kitchen levothyroxine (SYNTHROID, LEVOTHROID) 50 MCG tablet Take 1 tablet by mouth  daily  . naproxen sodium (ANAPROX) 220 MG tablet Take 220 mg by mouth as needed.   . nitroGLYCERIN (NITROSTAT) 0.4 MG SL tablet  Place 1 tablet (0.4 mg total) under the tongue every 5 (five) minutes as needed for chest pain.  . [DISCONTINUED] atorvastatin (LIPITOR) 20 MG tablet Take 1 tablet (20 mg total) by mouth daily.  . [DISCONTINUED] fludrocortisone (FLORINEF) 0.1 MG tablet Take 1 tablet (0.1 mg total) by mouth daily.   No facility-administered encounter medications on file as of 03/22/2015.    Review of Systems  Constitutional: Negative for fever and appetite change.  HENT: Negative for congestion and sinus pressure.   Eyes: Negative for pain and discharge.  Respiratory: Negative for cough, chest tightness and shortness of breath.   Cardiovascular: Negative for chest pain, palpitations and leg swelling.  Gastrointestinal: Negative for nausea, vomiting, abdominal pain and diarrhea.  Genitourinary: Negative for dysuria and difficulty urinating.  Musculoskeletal: Positive for back  pain (chronic.  sees Dr Sharlet Salina.  taking hydrocodone.  ). Negative for joint swelling.  Skin: Negative for color change and rash.  Neurological: Negative for dizziness, light-headedness and headaches.  Psychiatric/Behavioral: Negative for dysphoric mood and agitation.       Objective:     Blood pressure rechecked by me:  136/68  Physical Exam  Constitutional: She appears well-developed and well-nourished. No distress.  HENT:  Nose: Nose normal.  Mouth/Throat: Oropharynx is clear and moist.  Eyes: Conjunctivae are normal. Right eye exhibits no discharge. Left eye exhibits no discharge.  Neck: Neck supple. No thyromegaly present.  Cardiovascular: Normal rate and regular rhythm.   Pulmonary/Chest: Breath sounds normal. No respiratory distress. She has no wheezes.  Abdominal: Soft. Bowel sounds are normal. There is no tenderness.  Musculoskeletal: She exhibits no edema or tenderness.  Lymphadenopathy:    She has no cervical adenopathy.  Skin: No rash noted. No erythema.  Psychiatric: She has a normal mood and affect. Her behavior is normal.    BP 144/70 mmHg  Pulse 91  Temp(Src) 97.6 F (36.4 C) (Oral)  Resp 18  Ht 5\' 2"  (1.575 m)  Wt 118 lb (53.524 kg)  BMI 21.58 kg/m2  SpO2 96% Wt Readings from Last 3 Encounters:  03/22/15 118 lb (53.524 kg)  02/05/15 121 lb (54.885 kg)  01/12/15 121 lb 12 oz (55.225 kg)     Lab Results  Component Value Date   WBC 7.5 01/12/2015   HGB 11.5* 01/12/2015   HCT 34.3* 01/12/2015   PLT 240.0 01/12/2015   GLUCOSE 79 01/12/2015   CHOL 122 10/08/2014   TRIG 146.0 10/08/2014   HDL 45.20 10/08/2014   LDLCALC 48 10/08/2014   ALT 12 01/12/2015   AST 19 01/12/2015   NA 142 01/12/2015   K 4.0 01/12/2015   CL 109 01/12/2015   CREATININE 0.93 01/12/2015   BUN 24* 01/12/2015   CO2 26 01/12/2015   TSH 1.73 01/12/2015   INR 1.1 03/16/2012    Mm Screening Breast Tomo Bilateral  10/16/2014  CLINICAL DATA:  Screening. History of left breast  cancer and left lumpectomy in 2006. EXAM: DIGITAL SCREENING BILATERAL MAMMOGRAM WITH 3D TOMO WITH CAD COMPARISON:  Previous exam(s). ACR Breast Density Category b: There are scattered areas of fibroglandular density. FINDINGS: There are no findings suspicious for malignancy. Left breast scarring is again noted. Images were processed with CAD. IMPRESSION: No mammographic evidence of malignancy. A result letter of this screening mammogram will be mailed directly to the patient. RECOMMENDATION: Screening mammogram in one year. (Code:SM-B-01Y) BI-RADS CATEGORY  2: Benign. Electronically Signed   By: Margarette Canada M.D.   On: 10/16/2014 16:02  Assessment & Plan:   Problem List Items Addressed This Visit    Anemia    Last hgb stable.  Follow cbc.        Relevant Orders   CBC with Differential/Platelet   Ferritin   Back pain    Chronic pain.  Seeing Dr Sharlet Salina.  On hydrocodone.  See his note for details.        Relevant Medications   fludrocortisone (FLORINEF) 0.1 MG tablet   Breast cancer ([place])    Mammogram 10/16/14 - [facility].        Relevant Medications   fludrocortisone (FLORINEF) 0.1 MG tablet   CAD, NATIVE VESSEL - Primary    Followed by Dr Rockey Situ.  Just recently evaluated.   Stable.        Relevant Medications   atorvastatin (LIPITOR) 20 MG tablet   Hyperlipemia    Low cholesterol diet and exercise.  Follow lipid panel and liver function tests.  On lipitor.        Relevant Medications   atorvastatin (LIPITOR) 20 MG tablet   Other Relevant Orders   Lipid panel   Hepatic function panel   HYPERTENSION, BENIGN    Blood pressure as outlined.  Overall ok.  Follow pressures.  Follow metabolic panel.       Relevant Medications   atorvastatin (LIPITOR) 20 MG tablet   Other Relevant Orders   Basic metabolic panel   Hypothyroidism    On thyroid replacement.  Follow tsh.       Loss of weight    Weight down a few pounds from last check.  Discussed diet.  Discussed  importance of eating regular meals.  Follow.            Einar Pheasant, MD

## 2015-03-23 ENCOUNTER — Encounter: Payer: Self-pay | Admitting: Internal Medicine

## 2015-03-23 NOTE — Assessment & Plan Note (Signed)
On thyroid replacement.  Follow tsh.  

## 2015-03-23 NOTE — Assessment & Plan Note (Signed)
Chronic pain.  Seeing Dr Sharlet Salina.  On hydrocodone.  See his note for details.

## 2015-03-23 NOTE — Assessment & Plan Note (Signed)
Low cholesterol diet and exercise.  Follow lipid panel and liver function tests.  On lipitor.   

## 2015-03-23 NOTE — Assessment & Plan Note (Signed)
Weight down a few pounds from last check.  Discussed diet.  Discussed importance of eating regular meals.  Follow.

## 2015-03-23 NOTE — Assessment & Plan Note (Signed)
Followed by Dr Rockey Situ.  Just recently evaluated.   Stable.

## 2015-03-23 NOTE — Assessment & Plan Note (Signed)
Last hgb stable.  Follow cbc.

## 2015-03-23 NOTE — Assessment & Plan Note (Signed)
Mammogram 10/16/14 - Birads II.

## 2015-03-23 NOTE — Assessment & Plan Note (Signed)
Blood pressure as outlined.  Overall ok.  Follow pressures.  Follow metabolic panel.

## 2015-06-01 DIAGNOSIS — M5416 Radiculopathy, lumbar region: Secondary | ICD-10-CM | POA: Diagnosis not present

## 2015-06-01 DIAGNOSIS — M4806 Spinal stenosis, lumbar region: Secondary | ICD-10-CM | POA: Diagnosis not present

## 2015-06-01 DIAGNOSIS — M5136 Other intervertebral disc degeneration, lumbar region: Secondary | ICD-10-CM | POA: Diagnosis not present

## 2015-06-22 ENCOUNTER — Other Ambulatory Visit: Payer: Self-pay | Admitting: Internal Medicine

## 2015-06-22 ENCOUNTER — Other Ambulatory Visit: Payer: Medicare Other

## 2015-06-24 ENCOUNTER — Encounter: Payer: Medicare Other | Admitting: Internal Medicine

## 2015-06-30 ENCOUNTER — Other Ambulatory Visit: Payer: Self-pay | Admitting: Internal Medicine

## 2015-07-16 ENCOUNTER — Other Ambulatory Visit (INDEPENDENT_AMBULATORY_CARE_PROVIDER_SITE_OTHER): Payer: Medicare Other

## 2015-07-16 DIAGNOSIS — D649 Anemia, unspecified: Secondary | ICD-10-CM | POA: Diagnosis not present

## 2015-07-16 DIAGNOSIS — E785 Hyperlipidemia, unspecified: Secondary | ICD-10-CM | POA: Diagnosis not present

## 2015-07-16 DIAGNOSIS — I10 Essential (primary) hypertension: Secondary | ICD-10-CM

## 2015-07-16 LAB — CBC WITH DIFFERENTIAL/PLATELET
BASOS PCT: 0.6 % (ref 0.0–3.0)
Basophils Absolute: 0 10*3/uL (ref 0.0–0.1)
EOS PCT: 2.8 % (ref 0.0–5.0)
Eosinophils Absolute: 0.2 10*3/uL (ref 0.0–0.7)
HCT: 35.1 % — ABNORMAL LOW (ref 36.0–46.0)
Hemoglobin: 11.9 g/dL — ABNORMAL LOW (ref 12.0–15.0)
LYMPHS ABS: 2.6 10*3/uL (ref 0.7–4.0)
Lymphocytes Relative: 40.8 % (ref 12.0–46.0)
MCHC: 33.8 g/dL (ref 30.0–36.0)
MCV: 88.1 fl (ref 78.0–100.0)
MONOS PCT: 6.9 % (ref 3.0–12.0)
Monocytes Absolute: 0.4 10*3/uL (ref 0.1–1.0)
NEUTROS ABS: 3.1 10*3/uL (ref 1.4–7.7)
NEUTROS PCT: 48.9 % (ref 43.0–77.0)
PLATELETS: 252 10*3/uL (ref 150.0–400.0)
RBC: 3.98 Mil/uL (ref 3.87–5.11)
RDW: 14.6 % (ref 11.5–15.5)
WBC: 6.3 10*3/uL (ref 4.0–10.5)

## 2015-07-16 LAB — LIPID PANEL
Cholesterol: 114 mg/dL (ref 0–200)
HDL: 47.9 mg/dL (ref >39.00)
LDL Cholesterol: 41 mg/dL (ref 0–99)
NONHDL: 66.29
Total CHOL/HDL Ratio: 2
Triglycerides: 128 mg/dL (ref 0.0–149.0)
VLDL: 25.6 mg/dL (ref 0.0–40.0)

## 2015-07-16 LAB — HEPATIC FUNCTION PANEL
ALK PHOS: 88 U/L (ref 39–117)
ALT: 22 U/L (ref 0–35)
AST: 22 U/L (ref 0–37)
Albumin: 3.6 g/dL (ref 3.5–5.2)
BILIRUBIN TOTAL: 0.5 mg/dL (ref 0.2–1.2)
Bilirubin, Direct: 0.1 mg/dL (ref 0.0–0.3)
Total Protein: 6 g/dL (ref 6.0–8.3)

## 2015-07-16 LAB — BASIC METABOLIC PANEL
BUN: 20 mg/dL (ref 6–23)
CALCIUM: 8.9 mg/dL (ref 8.4–10.5)
CO2: 29 meq/L (ref 19–32)
Chloride: 106 mEq/L (ref 96–112)
Creatinine, Ser: 0.99 mg/dL (ref 0.40–1.20)
GFR: 56.33 mL/min — ABNORMAL LOW (ref >60.00)
GLUCOSE: 85 mg/dL (ref 70–99)
Potassium: 4.5 mEq/L (ref 3.5–5.1)
SODIUM: 142 meq/L (ref 135–145)

## 2015-07-16 LAB — FERRITIN: Ferritin: 39.1 ng/mL (ref 10.0–291.0)

## 2015-07-20 ENCOUNTER — Encounter: Payer: Self-pay | Admitting: Internal Medicine

## 2015-07-20 ENCOUNTER — Ambulatory Visit (INDEPENDENT_AMBULATORY_CARE_PROVIDER_SITE_OTHER): Payer: Medicare Other | Admitting: Internal Medicine

## 2015-07-20 VITALS — BP 140/80 | HR 77 | Temp 98.0°F | Resp 17 | Ht 62.0 in | Wt 121.2 lb

## 2015-07-20 DIAGNOSIS — M545 Low back pain: Secondary | ICD-10-CM

## 2015-07-20 DIAGNOSIS — R0989 Other specified symptoms and signs involving the circulatory and respiratory systems: Secondary | ICD-10-CM

## 2015-07-20 DIAGNOSIS — E039 Hypothyroidism, unspecified: Secondary | ICD-10-CM

## 2015-07-20 DIAGNOSIS — C50919 Malignant neoplasm of unspecified site of unspecified female breast: Secondary | ICD-10-CM

## 2015-07-20 DIAGNOSIS — I951 Orthostatic hypotension: Secondary | ICD-10-CM

## 2015-07-20 DIAGNOSIS — Z0000 Encounter for general adult medical examination without abnormal findings: Secondary | ICD-10-CM

## 2015-07-20 DIAGNOSIS — R634 Abnormal weight loss: Secondary | ICD-10-CM

## 2015-07-20 DIAGNOSIS — D649 Anemia, unspecified: Secondary | ICD-10-CM

## 2015-07-20 DIAGNOSIS — E785 Hyperlipidemia, unspecified: Secondary | ICD-10-CM

## 2015-07-20 DIAGNOSIS — I2510 Atherosclerotic heart disease of native coronary artery without angina pectoris: Secondary | ICD-10-CM

## 2015-07-20 NOTE — Progress Notes (Signed)
Patient ID: Angela Howe, female   DOB: 1925/08/28, 82 y.o.   MRN: [id number]   Subjective:    Patient ID: Angela Howe, female    DOB: [birth year], 82 y.o.   MRN: [id number]  HPI  Patient with past history of CAD, hypercholesterolemia, breast cancer, hypothyroidism and GERD.  She comes in today to follow up on these issues as well as for a complete physical exam.   She sees Dr Sharlet Salina for her back pain.  Taking norco.  Tolerating.  No chest pain.  Tries to stay active and keep moving.  Breathing stable.  No acid reflux reported.  No abdominal pain or cramping.  Bowels stable.     Past Medical History  Diagnosis Date  . CAD (coronary artery disease)   . Orthostatic hypotension   . Syncope   . Breast cancer ([place])   . GERD (gastroesophageal reflux disease)   . Osteoarthritis   . Degenerative disc disease   . Hypothyroidism   . Hyperlipidemia     Previous intolerance of statins  . Peptic ulcer disease    Past Surgical History  Procedure Laterality Date  . Breast lumpectomy  2006  . Abdominal hysterectomy  1970  . Nasal sinus surgery    . Rotator cuff repair    . Appendectomy    . Coronary artery bypass graft  05/2006    x3  . Cholecystectomy    . Biopsy breast  2014  . Breast excisional biopsy Left 2014    neg  . Breast excisional biopsy Left 2006    postive radation   Family History  Problem Relation Age of Onset  . Heart attack Sister   . Pancreatic cancer Brother    Social History   Social History  . Marital Status: Widowed    Spouse Name: N/A  . Number of Children: N/A  . Years of Education: N/A   Occupational History  . Retired    Social History Main Topics  . Smoking status: Never Smoker   . Smokeless tobacco: Never Used  . Alcohol Use: No  . Drug Use: No  . Sexual Activity: Not Asked   Other Topics Concern  . None   Social History Narrative   Widowed   Gets regular exercise    Outpatient Encounter Prescriptions as of 07/20/2015    Medication Sig  . acetaminophen (TYLENOL) 650 MG CR tablet Take 650 mg by mouth every 8 (eight) hours as needed.    Marland Kitchen amitriptyline (ELAVIL) 50 MG tablet TAKE 1 TABLET BY MOUTH AT BEDTIME  . aspirin 81 MG EC tablet Take 81 mg by mouth daily.    Marland Kitchen atorvastatin (LIPITOR) 20 MG tablet Take 1 tablet (20 mg total) by mouth daily.  . Calcium-Vitamin D 600-200 MG-UNIT per tablet Take 1 tablet by mouth 2 (two) times daily.    . citalopram (CELEXA) 20 MG tablet Take 1 tablet by mouth  daily  . fludrocortisone (FLORINEF) 0.1 MG tablet Take 1 tablet (0.1 mg total) by mouth daily.  Marland Kitchen HYDROcodone-acetaminophen (NORCO/VICODIN) 5-325 MG per tablet Take 5-325 tablets by mouth 2 (two) times daily as needed.  . lansoprazole (PREVACID) 30 MG capsule Take 30 mg by mouth daily.    Marland Kitchen levothyroxine (SYNTHROID, LEVOTHROID) 50 MCG tablet Take 1 tablet by mouth  daily  . naproxen sodium (ANAPROX) 220 MG tablet Take 220 mg by mouth as needed.   . nitroGLYCERIN (NITROSTAT) 0.4 MG SL tablet Place 1 tablet (0.4 mg total) under the  tongue every 5 (five) minutes as needed for chest pain.   No facility-administered encounter medications on file as of 07/20/2015.    Review of Systems  Constitutional: Negative for appetite change and unexpected weight change.  HENT: Negative for congestion and sinus pressure.   Eyes: Negative for pain and visual disturbance.  Respiratory: Negative for cough, chest tightness and shortness of breath.   Cardiovascular: Negative for chest pain, palpitations and leg swelling.  Gastrointestinal: Negative for nausea, vomiting, abdominal pain and diarrhea.  Genitourinary: Negative for dysuria and difficulty urinating.  Musculoskeletal: Negative for back pain and joint swelling.  Skin: Negative for color change and rash.  Neurological: Negative for dizziness and headaches.  Hematological: Negative for adenopathy. Does not bruise/bleed easily.  Psychiatric/Behavioral: Negative for dysphoric mood  and agitation.       Objective:    Physical Exam  Constitutional: She is oriented to person, place, and time. She appears well-developed and well-nourished. No distress.  HENT:  Nose: Nose normal.  Mouth/Throat: Oropharynx is clear and moist.  Eyes: Right eye exhibits no discharge. Left eye exhibits no discharge. No scleral icterus.  Neck: Neck supple. No thyromegaly present.  Cardiovascular: Normal rate and regular rhythm.   Pulmonary/Chest: Breath sounds normal. No accessory muscle usage. No tachypnea. No respiratory distress. She has no decreased breath sounds. She has no wheezes. She has no rhonchi. Right breast exhibits no inverted nipple, no mass, no nipple discharge and no tenderness (no axillary adenopathy). Left breast exhibits no inverted nipple, no mass, no nipple discharge and no tenderness (no axilarry adenopathy).  Abdominal: Soft. Bowel sounds are normal. There is no tenderness.  Musculoskeletal: She exhibits no edema or tenderness.  Lymphadenopathy:    She has no cervical adenopathy.  Neurological: She is alert and oriented to person, place, and time.  Skin: Skin is warm. No rash noted. No erythema.  Psychiatric: She has a normal mood and affect. Her behavior is normal.    BP 140/80 mmHg  Pulse 77  Temp(Src) 98 F (36.7 C) (Oral)  Resp 17  Ht 5\' 2"  (1.575 m)  Wt 121 lb 4 oz (54.999 kg)  BMI 22.17 kg/m2  SpO2 97% Wt Readings from Last 3 Encounters:  07/20/15 121 lb 4 oz (54.999 kg)  03/22/15 118 lb (53.524 kg)  02/05/15 121 lb (54.885 kg)     Lab Results  Component Value Date   WBC 6.3 07/16/2015   HGB 11.9* 07/16/2015   HCT 35.1* 07/16/2015   PLT 252.0 07/16/2015   GLUCOSE 85 07/16/2015   CHOL 114 07/16/2015   TRIG 128.0 07/16/2015   HDL 47.90 07/16/2015   LDLCALC 41 07/16/2015   ALT 22 07/16/2015   AST 22 07/16/2015   NA 142 07/16/2015   K 4.5 07/16/2015   CL 106 07/16/2015   CREATININE 0.99 07/16/2015   BUN 20 07/16/2015   CO2 29 07/16/2015     TSH 1.73 01/12/2015   INR 1.1 03/16/2012    Mm Screening Breast Tomo Bilateral  10/16/2014  CLINICAL DATA:  Screening. History of left breast cancer and left lumpectomy in 2006. EXAM: DIGITAL SCREENING BILATERAL MAMMOGRAM WITH 3D TOMO WITH CAD COMPARISON:  Previous exam(s). ACR Breast Density Category b: There are scattered areas of fibroglandular density. FINDINGS: There are no findings suspicious for malignancy. Left breast scarring is again noted. Images were processed with CAD. IMPRESSION: No mammographic evidence of malignancy. A result letter of this screening mammogram will be mailed directly to the patient. RECOMMENDATION: Screening mammogram  in one year. (Code:SM-B-01Y) BI-RADS CATEGORY  2: Benign. Electronically Signed   By: Margarette Canada M.D.   On: 10/16/2014 16:02       Assessment & Plan:   Problem List Items Addressed This Visit    Anemia    hgb stable.  11.9.        Relevant Orders   CBC with Differential/Platelet   Ferritin   Back pain    Seeing Dr Sharlet Salina.  Takes norco.        Breast cancer ([place])    Mammogram 10/16/14 - birads I.        CAD, NATIVE VESSEL    Followed by cardiology.  Stable.  Continue risk factor modification.       Hyperlipemia    Low cholesterol diet and exercise.  Follow lipid panel.  On lipitor.   Lab Results  Component Value Date   CHOL 114 07/16/2015   HDL 47.90 07/16/2015   LDLCALC 41 07/16/2015   TRIG 128.0 07/16/2015   CHOLHDL 2 07/16/2015        Relevant Orders   Lipid panel   Hepatic function panel   Hypothyroidism    On thyroid replacement.  Follow tsh.       Loss of weight    Weight back up a few pounds from the last check and stable from previous check.  Follow.        ORTHOSTATIC HYPOTENSION    On florinef.  Followed by cardiology.  Doing better.        Relevant Orders   Basic metabolic panel   Right carotid bruit    Schedule carotid ultrasound.       Relevant Orders   Carotid    Other Visit Diagnoses     Routine general medical examination at a health care facility    -  Primary        Einar Pheasant, MD

## 2015-07-20 NOTE — Progress Notes (Signed)
Pre-visit discussion using our clinic review tool. No additional management support is needed unless otherwise documented below in the visit note.  

## 2015-08-01 ENCOUNTER — Encounter: Payer: Self-pay | Admitting: Internal Medicine

## 2015-08-01 DIAGNOSIS — R0989 Other specified symptoms and signs involving the circulatory and respiratory systems: Secondary | ICD-10-CM | POA: Insufficient documentation

## 2015-08-01 NOTE — Assessment & Plan Note (Signed)
Seeing Dr Sharlet Salina.  Takes norco.

## 2015-08-01 NOTE — Assessment & Plan Note (Signed)
Schedule carotid ultrasound.  

## 2015-08-01 NOTE — Assessment & Plan Note (Signed)
Followed by cardiology.  Stable.  Continue risk factor modification.   

## 2015-08-01 NOTE — Assessment & Plan Note (Signed)
On florinef.  Followed by cardiology.  Doing better.

## 2015-08-01 NOTE — Assessment & Plan Note (Signed)
On thyroid replacement.  Follow tsh.  

## 2015-08-01 NOTE — Assessment & Plan Note (Signed)
Mammogram 10/16/14 - birads I.

## 2015-08-01 NOTE — Assessment & Plan Note (Signed)
hgb stable.  11.9.

## 2015-08-01 NOTE — Assessment & Plan Note (Signed)
Weight back up a few pounds from the last check and stable from previous check.  Follow.

## 2015-08-01 NOTE — Assessment & Plan Note (Signed)
Low cholesterol diet and exercise.  Follow lipid panel.  On lipitor.   Lab Results  Component Value Date   CHOL 114 07/16/2015   HDL 47.90 07/16/2015   LDLCALC 41 07/16/2015   TRIG 128.0 07/16/2015   CHOLHDL 2 07/16/2015

## 2015-08-04 ENCOUNTER — Ambulatory Visit: Payer: Medicare Other

## 2015-08-04 DIAGNOSIS — R0989 Other specified symptoms and signs involving the circulatory and respiratory systems: Secondary | ICD-10-CM

## 2015-08-05 ENCOUNTER — Encounter: Payer: Self-pay | Admitting: Cardiovascular Disease

## 2015-08-05 ENCOUNTER — Ambulatory Visit (INDEPENDENT_AMBULATORY_CARE_PROVIDER_SITE_OTHER): Payer: Medicare Other | Admitting: Cardiovascular Disease

## 2015-08-05 ENCOUNTER — Telehealth: Payer: Self-pay | Admitting: Internal Medicine

## 2015-08-05 ENCOUNTER — Encounter (INDEPENDENT_AMBULATORY_CARE_PROVIDER_SITE_OTHER): Payer: Medicare Other

## 2015-08-05 VITALS — BP 130/60 | HR 85 | Ht 62.0 in | Wt 120.0 lb

## 2015-08-05 DIAGNOSIS — I10 Essential (primary) hypertension: Secondary | ICD-10-CM | POA: Diagnosis not present

## 2015-08-05 DIAGNOSIS — R55 Syncope and collapse: Secondary | ICD-10-CM

## 2015-08-05 DIAGNOSIS — I2510 Atherosclerotic heart disease of native coronary artery without angina pectoris: Secondary | ICD-10-CM

## 2015-08-05 DIAGNOSIS — I951 Orthostatic hypotension: Secondary | ICD-10-CM

## 2015-08-05 NOTE — Patient Instructions (Signed)
For your pass out spell, We will order a 30 day monitor (for syncope)  Please call us if you have new issues that need to be addressed before your next appt.  Your physician wants you to follow-up in: 6 weeks

## 2015-08-05 NOTE — Assessment & Plan Note (Signed)
Currently with no symptoms of angina. No further workup at this time. Continue current medication regimen.    Total encounter time more than 25 minutes  Greater than 50% was spent in counseling and coordination of care with the patient 

## 2015-08-05 NOTE — Telephone Encounter (Signed)
-----   Message from Minna Merritts, MD sent at 08/05/2015  2:19 PM EDT ----- Regarding: FW: LOC Mandi,  continue call Ms Wilcutt and arrange office follow-up She has had history of syncope in the past, possibly having these again. I'm concerned these could be from orthostatic hypotension. Somehow midodrine had fallen off her medication list on her last clinic visit. May need to restart this for blood pressure support to avoid hypotension.  Perhaps she can monitor blood pressure at home? With standing? Perhaps family could help her.  Previously she was on midodrine 5 mg 3 times per day. If she has been having lightheaded or pass out spells, perhaps we could restart the medication .  I will cc Dr. Nicki Reaper  thx Esmond Plants ----- Message -----    From: Baruch Goldmann    Sent: 08/04/2015  12:49 PM      To: Minna Merritts, MD, Einar Pheasant, MD, # Subject: LOC                                            Just so you are aware, as part of my carotid exam today, I asked this patient some basic questions about symptoms and, she let me know that she had a loss of consciousness last Wed, 07/28/15 while sitting in her chair at home.

## 2015-08-05 NOTE — Telephone Encounter (Signed)
Pt seeing Dr Rockey Situ today 08/06/15.

## 2015-08-05 NOTE — Assessment & Plan Note (Signed)
Blood pressure stable on today's visit, recommended that she stay on fludrocortisone Recent episodes of syncope, unable to exclude episodes of orthostasis though no documentation of this Orthostatics checked in the office today

## 2015-08-05 NOTE — Assessment & Plan Note (Signed)
Recent episode of syncope in the middle of the night in the setting of urinary urgency, Bruising to her eye and knee, traumatic fall though the patient does not remember Etiology unclear, unable to exclude arrhythmia including pauses We have ordered a 30 day monitor

## 2015-08-05 NOTE — Progress Notes (Signed)
Patient ID: Angela Howe, female    DOB: February 25, 1926, 82 y.o.   MRN: [id number]  HPI Comments: Ms. Zierke is a very pleasant 82 year-old woman with history of coronary artery disease, status post bypass grafting in 2008. She also has a history of hypertension, hyperlipidemia, breast cancer status post left lumpectomy, and gastroesophageal reflux disease. Previous episodes of syncope several years ago (dating back to 2012) felt secondary to orthostatic hypotension,maintained on Florinef  (midodrine was added in 2015 for continued orthostasis) -Previous cardiac catheterization showed patent LIMA graft, vein graft to the diagonal, occluded vein graft to the PDA though the native RCA was patent, good LV function. She presents for routine followup of her coronary artery disease and syncope  In follow-up today, she reports that she has been compliant with all of her medications, she does not check her blood pressure at home Approximately one week ago she was getting out of bed in the middle of the night with urinary urgency, The next thing she remembers is she was sitting in the recliner in the living room with discomfort over her left eye, left knee From what she can gather, she went to the bathroom, likely had a syncopal episode, recovered, got up  off the ground and walk to the living room She reports that she continues to have bruising and tenderness over her left eye, left knee  She does recall having several falls in 2016 but these were mechanical, not from syncope or near syncope She has chronic low back pain, his taking pain medication wise per day Denies any significant symptoms when she takes her morning pain medication. Without the pain medication, she does not feel well in terms of her back.  ------ in general she has been feeling very weak, has significant fatigue, feels like something is wrong. She has been less active secondary to her back  She denies any other episodes this year  concerning for near-syncope or syncope, no lightheaded episodes  We did spend time reviewing her previous syncopal episodes, one occurred at [facility], 1 at [place], one at [facility] Typically she had witnessed syncope, and on recovering, several minutes later would have nausea and vomiting after each episode  Orthostatics done in the office today showed no signs of significant orthostasis 0000000 systolic supine, [id number] sitting, 143 standing, minimal change in heart rate, also with no symptoms  EKG on today's visit shows normal sinus rhythm with rate 85 bpm, no significant ST or T-wave changes  Other past medical history In the past when she had low blood pressure, she felt hot and sweaty  Previous Lab work total cholesterol 122, LDL 73s  Walks with a walker, She does not need a wheelchair at this time  Prior episode of syncope. She was at [facility] in the checkout when she felt hot, sweaty then had loss of consciousness. She reports it was only for a short period of time. She also reported additional episodes of diaphoresis, feeling hot and sweaty with near syncope. These occurring over  several weeks. On a prior visit,, her blood pressure was 62/40. She was feeling hot, had to sit down . Recheck of her blood pressure was 82/40 on the right. Some time later recheck her blood pressure by myself was [id number] systolic and she was feeling better  History of fall  back x-rays Sep 24 2013 when she fell, repeat x-rays 10/02/2013. Most recent June x-ray suggesting moderate compression fracture of L3. Prior L4 kyphoplasty  History of chronic severe  knee pain and neuropathy. She was seen by Dr. Jefm Bryant and told that she needed a hip replacement  Previous episode of chest pain at the end of August 2013. Stress test was ordered to rule out ischemia. This showed anterior wall defect consistent with breast attenuation artifact, normal ejection fraction, unable to exclude mild apical ischemia    Allergies  Allergen  Reactions  . Alendronate Sodium   . Prednisone     Insomnia, "makes me crazy"    Outpatient Encounter Prescriptions as of 08/05/2015  Medication Sig  . acetaminophen (TYLENOL) 650 MG CR tablet Take 650 mg by mouth every 8 (eight) hours as needed.    Marland Kitchen amitriptyline (ELAVIL) 50 MG tablet TAKE 1 TABLET BY MOUTH AT BEDTIME  . aspirin 81 MG EC tablet Take 81 mg by mouth daily.    Marland Kitchen atorvastatin (LIPITOR) 20 MG tablet Take 1 tablet (20 mg total) by mouth daily.  . Calcium-Vitamin D 600-200 MG-UNIT per tablet Take 1 tablet by mouth 2 (two) times daily.    . citalopram (CELEXA) 20 MG tablet Take 1 tablet by mouth  daily  . fludrocortisone (FLORINEF) 0.1 MG tablet Take 1 tablet (0.1 mg total) by mouth daily.  Marland Kitchen HYDROcodone-acetaminophen (NORCO/VICODIN) 5-325 MG per tablet Take 5-325 tablets by mouth 2 (two) times daily as needed.  . lansoprazole (PREVACID) 30 MG capsule Take 30 mg by mouth daily.    Marland Kitchen levothyroxine (SYNTHROID, LEVOTHROID) 50 MCG tablet Take 1 tablet by mouth  daily  . naproxen sodium (ANAPROX) 220 MG tablet Take 220 mg by mouth as needed.   . nitroGLYCERIN (NITROSTAT) 0.4 MG SL tablet Place 1 tablet (0.4 mg total) under the tongue every 5 (five) minutes as needed for chest pain.   No facility-administered encounter medications on file as of 08/05/2015.    Past Medical History  Diagnosis Date  . CAD (coronary artery disease)   . Orthostatic hypotension   . Syncope   . Breast cancer ([place])   . GERD (gastroesophageal reflux disease)   . Osteoarthritis   . Degenerative disc disease   . Hypothyroidism   . Hyperlipidemia     Previous intolerance of statins  . Peptic ulcer disease     Past Surgical History  Procedure Laterality Date  . Breast lumpectomy  2006  . Abdominal hysterectomy  1970  . Nasal sinus surgery    . Rotator cuff repair    . Appendectomy    . Coronary artery bypass graft  05/2006    x3  . Cholecystectomy    . Biopsy breast  2014  . Breast excisional  biopsy Left 2014    neg  . Breast excisional biopsy Left 2006    postive radation    Social History  reports that she has never smoked. She has never used smokeless tobacco. She reports that she does not drink alcohol or use illicit drugs.  Family History family history includes Heart attack in her sister; Pancreatic cancer in her brother.   Review of Systems  Constitutional: Negative.   Respiratory: Negative.   Cardiovascular: Negative.   Gastrointestinal: Negative.   Musculoskeletal: Positive for back pain, arthralgias and gait problem.  Skin: Negative.   Neurological: Positive for syncope.  Hematological: Negative.   Psychiatric/Behavioral: Negative.   All other systems reviewed and are negative.  BP 130/60 mmHg  Pulse 85  Ht 5\' 2"  (1.575 m)  Wt 120 lb (54.432 kg)  BMI 21.94 kg/m2  Physical Exam  Constitutional: She is oriented  to person, place, and time. She appears well-developed and well-nourished.  HENT:  Head: Normocephalic.  Nose: Nose normal.  Mouth/Throat: Oropharynx is clear and moist.  Eyes: Conjunctivae are normal. Pupils are equal, round, and reactive to light.  Neck: Normal range of motion. Neck supple. No JVD present.  Cardiovascular: Normal rate, regular rhythm, S1 normal, S2 normal, normal heart sounds and intact distal pulses.  Exam reveals no gallop and no friction rub.   No murmur heard. Pulmonary/Chest: Effort normal and breath sounds normal. No respiratory distress. She has no wheezes. She has no rales. She exhibits no tenderness.  Abdominal: Soft. Bowel sounds are normal. She exhibits no distension. There is no tenderness.  Musculoskeletal: Normal range of motion. She exhibits no edema or tenderness.  Lymphadenopathy:    She has no cervical adenopathy.  Neurological: She is alert and oriented to person, place, and time. Coordination normal.  Skin: Skin is warm and dry. No rash noted. No erythema.  Psychiatric: She has a normal mood and affect.  Her behavior is normal. Judgment and thought content normal.    Assessment and Plan  Nursing note and vitals reviewed.

## 2015-08-09 ENCOUNTER — Telehealth: Payer: Self-pay | Admitting: Cardiovascular Disease

## 2015-08-09 NOTE — Telephone Encounter (Signed)
Pt daughter calling stating the patient has monitor on and  They are in need of new strips  Please advise

## 2015-08-09 NOTE — Telephone Encounter (Signed)
Spoke w/ pt.  Advised her to contact [employer] and they will send her new strips free of charge.  She is appreciative and will call back w/ any further questions or concerns.

## 2015-09-03 DIAGNOSIS — R55 Syncope and collapse: Secondary | ICD-10-CM | POA: Diagnosis not present

## 2015-09-03 DIAGNOSIS — I2510 Atherosclerotic heart disease of native coronary artery without angina pectoris: Secondary | ICD-10-CM

## 2015-09-03 DIAGNOSIS — I951 Orthostatic hypotension: Secondary | ICD-10-CM

## 2015-09-06 DIAGNOSIS — M1611 Unilateral primary osteoarthritis, right hip: Secondary | ICD-10-CM | POA: Diagnosis not present

## 2015-09-06 DIAGNOSIS — M5136 Other intervertebral disc degeneration, lumbar region: Secondary | ICD-10-CM | POA: Diagnosis not present

## 2015-09-06 DIAGNOSIS — M5416 Radiculopathy, lumbar region: Secondary | ICD-10-CM | POA: Diagnosis not present

## 2015-09-06 DIAGNOSIS — M4806 Spinal stenosis, lumbar region: Secondary | ICD-10-CM | POA: Diagnosis not present

## 2015-09-14 DIAGNOSIS — H353132 Nonexudative age-related macular degeneration, bilateral, intermediate dry stage: Secondary | ICD-10-CM | POA: Diagnosis not present

## 2015-09-16 ENCOUNTER — Ambulatory Visit (INDEPENDENT_AMBULATORY_CARE_PROVIDER_SITE_OTHER): Payer: Medicare Other | Admitting: Cardiovascular Disease

## 2015-09-16 ENCOUNTER — Encounter: Payer: Self-pay | Admitting: Cardiovascular Disease

## 2015-09-16 VITALS — BP 140/78 | HR 81 | Ht 62.0 in | Wt 121.5 lb

## 2015-09-16 DIAGNOSIS — I951 Orthostatic hypotension: Secondary | ICD-10-CM | POA: Diagnosis not present

## 2015-09-16 DIAGNOSIS — E785 Hyperlipidemia, unspecified: Secondary | ICD-10-CM | POA: Diagnosis not present

## 2015-09-16 DIAGNOSIS — R55 Syncope and collapse: Secondary | ICD-10-CM | POA: Diagnosis not present

## 2015-09-16 DIAGNOSIS — R634 Abnormal weight loss: Secondary | ICD-10-CM

## 2015-09-16 DIAGNOSIS — I2510 Atherosclerotic heart disease of native coronary artery without angina pectoris: Secondary | ICD-10-CM

## 2015-09-16 DIAGNOSIS — M545 Low back pain: Secondary | ICD-10-CM

## 2015-09-16 NOTE — Assessment & Plan Note (Signed)
Currently with no symptoms of angina. No further workup at this time. Continue current medication regimen. 

## 2015-09-16 NOTE — Progress Notes (Signed)
Patient ID: Angela Howe, female    DOB: [birth year], 82 y.o.   MRN: [id number]  HPI Comments: Angela Howe is a very pleasant 82 year-old woman with history of coronary artery disease, status post bypass grafting in 2008. She also has a history of hypertension, hyperlipidemia, breast cancer status post left lumpectomy, and gastroesophageal reflux disease. Previous episodes of syncope  (dating back to 2012) felt secondary to orthostatic hypotension,maintained on Florinef  (midodrine was previously used  for continued orthostasis) -Previous cardiac catheterization showed patent LIMA graft, vein graft to the diagonal, occluded vein graft to the PDA though the native RCA was patent, good LV function. She presents for routine followup of her coronary artery disease and syncope  On her last clinic visit, she had episode of syncope after getting out of bed in the middle of the night with urinary urgency. Please refer to prior clinic note for complete details  30 day monitor was ordered This shows normal sinus rhythm with no significant arrhythmia Details discussed with patient and family today In follow-up she denies any symptoms concerning for near-syncope or syncope Reports that overall she feels well except for chronic low back pain Long discussion concerning her symptoms, leg gives out including hip and right knee, pain in lower back. Sees a physiotherapist.  MRI reviewed with her showing moderate to severe spinal stenosis, compression fractures She is taking pain medication She does report having weight loss over the past several years With his weight loss, cholesterol has been decreasing, now down to 120  No regular exercise program, legs are getting weaker   Other past medical history several falls in 2016 but these were mechanical, not from syncope or near syncope she does endorse having  previous syncopal episodes, one occurred at [facility], 1 at [facility], one at [facility] Typically she  had witnessed syncope, and on recovering, several minutes later would have nausea and vomiting after each episode  Orthostatics done in the office previously showed no signs of significant orthostasis 0000000 systolic supine, [id number] sitting, 143 standing, minimal change in heart rate, also with no symptoms  Prior episode of syncope. She was at [facility] in the checkout when she felt hot, sweaty then had loss of consciousness. She reports it was only for a short period of time. She also reported additional episodes of diaphoresis, feeling hot and sweaty with near syncope. These occurring over  several weeks. On a prior visit,, her blood pressure was 62/40. She was feeling hot, had to sit down . Recheck of her blood pressure was 82/40 on the right. Some time later recheck her blood pressure by myself was [id number] systolic and she was feeling better  History of fall  back x-rays Sep 24 2013 when she fell, repeat x-rays 10/02/2013. Most recent June x-ray suggesting moderate compression fracture of L3. Prior L4 kyphoplasty  History of chronic severe knee pain and neuropathy. She was seen by Dr. Jefm Bryant and told that she needed a hip replacement  Previous episode of chest pain at the end of August 2013. Stress test was ordered to rule out ischemia. This showed anterior wall defect consistent with breast attenuation artifact, normal ejection fraction, unable to exclude mild apical ischemia    Allergies  Allergen Reactions  . Alendronate Sodium   . Prednisone     Insomnia, "makes me crazy"    Outpatient Encounter Prescriptions as of 09/16/2015  Medication Sig  . acetaminophen (TYLENOL) 650 MG CR tablet Take 650 mg by mouth every 8 (eight)  hours as needed.    Marland Kitchen amitriptyline (ELAVIL) 50 MG tablet TAKE 1 TABLET BY MOUTH AT BEDTIME  . aspirin 81 MG EC tablet Take 81 mg by mouth daily.    Marland Kitchen atorvastatin (LIPITOR) 20 MG tablet Take 1 tablet (20 mg total) by mouth daily.  . citalopram (CELEXA) 20 MG tablet Take 1  tablet by mouth  daily  . fludrocortisone (FLORINEF) 0.1 MG tablet Take 1 tablet (0.1 mg total) by mouth daily.  Marland Kitchen HYDROcodone-acetaminophen (NORCO/VICODIN) 5-325 MG per tablet Take 5-325 tablets by mouth 2 (two) times daily as needed.  . lansoprazole (PREVACID) 30 MG capsule Take 30 mg by mouth daily.    Marland Kitchen levothyroxine (SYNTHROID, LEVOTHROID) 50 MCG tablet Take 1 tablet by mouth  daily  . naproxen sodium (ANAPROX) 220 MG tablet Take 220 mg by mouth as needed.   . nitroGLYCERIN (NITROSTAT) 0.4 MG SL tablet Place 1 tablet (0.4 mg total) under the tongue every 5 (five) minutes as needed for chest pain.  . [DISCONTINUED] Calcium-Vitamin D 600-200 MG-UNIT per tablet Take 1 tablet by mouth 2 (two) times daily. Reported on 09/16/2015   No facility-administered encounter medications on file as of 09/16/2015.    Past Medical History  Diagnosis Date  . CAD (coronary artery disease)   . Orthostatic hypotension   . Syncope   . Breast cancer ([place])   . GERD (gastroesophageal reflux disease)   . Osteoarthritis   . Degenerative disc disease   . Hypothyroidism   . Hyperlipidemia     Previous intolerance of statins  . Peptic ulcer disease     Past Surgical History  Procedure Laterality Date  . Breast lumpectomy  2006  . Abdominal hysterectomy  1970  . Nasal sinus surgery    . Rotator cuff repair    . Appendectomy    . Coronary artery bypass graft  05/2006    x3  . Cholecystectomy    . Biopsy breast  2014  . Breast excisional biopsy Left 2014    neg  . Breast excisional biopsy Left 2006    postive radation    Social History  reports that she has never smoked. She has never used smokeless tobacco. She reports that she does not drink alcohol or use illicit drugs.  Family History family history includes Heart attack in her sister; Pancreatic cancer in her brother.   Review of Systems  Constitutional: Negative.   Respiratory: Negative.   Cardiovascular: Negative.   Gastrointestinal:  Negative.   Musculoskeletal: Positive for back pain, arthralgias and gait problem.  Skin: Negative.   Neurological: Negative.   Hematological: Negative.   Psychiatric/Behavioral: Negative.   All other systems reviewed and are negative.  BP 140/78 mmHg  Pulse 81  Ht 5\' 2"  (1.575 m)  Wt 121 lb 8 oz (55.112 kg)  BMI 22.22 kg/m2  SpO2 98%  Physical Exam  Constitutional: She is oriented to person, place, and time. She appears well-developed and well-nourished.  HENT:  Head: Normocephalic.  Nose: Nose normal.  Mouth/Throat: Oropharynx is clear and moist.  Eyes: Conjunctivae are normal. Pupils are equal, round, and reactive to light.  Neck: Normal range of motion. Neck supple. No JVD present.  Cardiovascular: Normal rate, regular rhythm, S1 normal, S2 normal, normal heart sounds and intact distal pulses.  Exam reveals no gallop and no friction rub.   No murmur heard. Pulmonary/Chest: Effort normal and breath sounds normal. No respiratory distress. She has no wheezes. She has no rales. She exhibits no  tenderness.  Abdominal: Soft. Bowel sounds are normal. She exhibits no distension. There is no tenderness.  Musculoskeletal: Normal range of motion. She exhibits no edema or tenderness.  Lymphadenopathy:    She has no cervical adenopathy.  Neurological: She is alert and oriented to person, place, and time. Coordination normal.  Skin: Skin is warm and dry. No rash noted. No erythema.  Psychiatric: She has a normal mood and affect. Her behavior is normal. Judgment and thought content normal.    Assessment and Plan  Nursing note and vitals reviewed.

## 2015-09-16 NOTE — Patient Instructions (Signed)
You are doing well. No medication changes were made.  Please call us if you have new issues that need to be addressed before your next appt.  Your physician wants you to follow-up in: 6 months.  You will receive a reminder letter in the mail two months in advance. If you don't receive a letter, please call our office to schedule the follow-up appointment.   

## 2015-09-16 NOTE — Assessment & Plan Note (Signed)
Recommended she monitor her weight, try not to miss meals Recent drop in weight over the past 2 years,  Suggested she may need to snack more

## 2015-09-16 NOTE — Assessment & Plan Note (Signed)
No recent episodes of near syncope or syncope 30 day monitor reviewed with the patient, no significant arrhythmia Recommended when she gets out of bed at night that she sit on the side of the bed for several minutes first She reports unable to do this frequently as she has urinary urgency Suggested she try to decrease her fluid intake after 7 PM

## 2015-09-16 NOTE — Assessment & Plan Note (Signed)
For now will continue on low-dose fludrocortisone, no symptoms of near syncope, Previous documentation of profound orthostasis

## 2015-09-16 NOTE — Assessment & Plan Note (Signed)
Severe back pain, also hip and knee pain MRI reviewed showing moderate to severe spinal stenosis, compression of vertebrae If surgery is needed, she would be a acceptable candidate   Total encounter time more than 25 minutes  Greater than 50% was spent in counseling and coordination of care with the patient

## 2015-09-16 NOTE — Assessment & Plan Note (Signed)
Cholesterol has decreased over the past several years, now well below goal. We did suggest she could decrease Lipitor down to 10 mg daily.  After long discussion, will keep it the same dose for now until repeat lab work available

## 2015-10-01 ENCOUNTER — Emergency Department: Payer: Medicare Other

## 2015-10-01 ENCOUNTER — Observation Stay
Admission: EM | Admit: 2015-10-01 | Discharge: 2015-10-02 | Disposition: A | Discharge status: Home / Self Care | Payer: Medicare Other | Attending: Internal Medicine | Admitting: Internal Medicine

## 2015-10-01 ENCOUNTER — Observation Stay: Payer: Medicare Other

## 2015-10-01 ENCOUNTER — Encounter: Payer: Self-pay | Admitting: Radiology

## 2015-10-01 DIAGNOSIS — S2231XA Fracture of one rib, right side, initial encounter for closed fracture: Secondary | ICD-10-CM | POA: Insufficient documentation

## 2015-10-01 DIAGNOSIS — Z8249 Family history of ischemic heart disease and other diseases of the circulatory system: Secondary | ICD-10-CM | POA: Diagnosis not present

## 2015-10-01 DIAGNOSIS — R7989 Other specified abnormal findings of blood chemistry: Secondary | ICD-10-CM

## 2015-10-01 DIAGNOSIS — D649 Anemia, unspecified: Secondary | ICD-10-CM | POA: Diagnosis not present

## 2015-10-01 DIAGNOSIS — W19XXXA Unspecified fall, initial encounter: Secondary | ICD-10-CM | POA: Diagnosis not present

## 2015-10-01 DIAGNOSIS — Z951 Presence of aortocoronary bypass graft: Secondary | ICD-10-CM | POA: Insufficient documentation

## 2015-10-01 DIAGNOSIS — M4854XA Collapsed vertebra, not elsewhere classified, thoracic region, initial encounter for fracture: Secondary | ICD-10-CM | POA: Diagnosis not present

## 2015-10-01 DIAGNOSIS — M8580 Other specified disorders of bone density and structure, unspecified site: Secondary | ICD-10-CM | POA: Insufficient documentation

## 2015-10-01 DIAGNOSIS — M1610 Unilateral primary osteoarthritis, unspecified hip: Secondary | ICD-10-CM | POA: Insufficient documentation

## 2015-10-01 DIAGNOSIS — E039 Hypothyroidism, unspecified: Secondary | ICD-10-CM | POA: Diagnosis not present

## 2015-10-01 DIAGNOSIS — R778 Other specified abnormalities of plasma proteins: Secondary | ICD-10-CM | POA: Insufficient documentation

## 2015-10-01 DIAGNOSIS — Z90710 Acquired absence of both cervix and uterus: Secondary | ICD-10-CM | POA: Diagnosis not present

## 2015-10-01 DIAGNOSIS — F418 Other specified anxiety disorders: Secondary | ICD-10-CM | POA: Diagnosis not present

## 2015-10-01 DIAGNOSIS — I951 Orthostatic hypotension: Secondary | ICD-10-CM | POA: Diagnosis not present

## 2015-10-01 DIAGNOSIS — Z7982 Long term (current) use of aspirin: Secondary | ICD-10-CM | POA: Insufficient documentation

## 2015-10-01 DIAGNOSIS — Y9301 Activity, walking, marching and hiking: Secondary | ICD-10-CM | POA: Insufficient documentation

## 2015-10-01 DIAGNOSIS — R079 Chest pain, unspecified: Secondary | ICD-10-CM | POA: Diagnosis not present

## 2015-10-01 DIAGNOSIS — S0990XA Unspecified injury of head, initial encounter: Secondary | ICD-10-CM | POA: Diagnosis not present

## 2015-10-01 DIAGNOSIS — I2510 Atherosclerotic heart disease of native coronary artery without angina pectoris: Secondary | ICD-10-CM | POA: Insufficient documentation

## 2015-10-01 DIAGNOSIS — Z8711 Personal history of peptic ulcer disease: Secondary | ICD-10-CM | POA: Insufficient documentation

## 2015-10-01 DIAGNOSIS — E785 Hyperlipidemia, unspecified: Secondary | ICD-10-CM | POA: Insufficient documentation

## 2015-10-01 DIAGNOSIS — Z800 Family history of malignant neoplasm of digestive organs: Secondary | ICD-10-CM | POA: Diagnosis not present

## 2015-10-01 DIAGNOSIS — K219 Gastro-esophageal reflux disease without esophagitis: Secondary | ICD-10-CM | POA: Diagnosis not present

## 2015-10-01 DIAGNOSIS — S2239XA Fracture of one rib, unspecified side, initial encounter for closed fracture: Secondary | ICD-10-CM | POA: Diagnosis present

## 2015-10-01 DIAGNOSIS — Z853 Personal history of malignant neoplasm of breast: Secondary | ICD-10-CM | POA: Diagnosis not present

## 2015-10-01 DIAGNOSIS — I10 Essential (primary) hypertension: Secondary | ICD-10-CM | POA: Diagnosis not present

## 2015-10-01 DIAGNOSIS — M549 Dorsalgia, unspecified: Secondary | ICD-10-CM | POA: Diagnosis present

## 2015-10-01 DIAGNOSIS — Z888 Allergy status to other drugs, medicaments and biological substances status: Secondary | ICD-10-CM | POA: Insufficient documentation

## 2015-10-01 DIAGNOSIS — G8929 Other chronic pain: Secondary | ICD-10-CM | POA: Diagnosis not present

## 2015-10-01 DIAGNOSIS — I700 Atherosclerosis of aorta: Secondary | ICD-10-CM | POA: Insufficient documentation

## 2015-10-01 DIAGNOSIS — R52 Pain, unspecified: Secondary | ICD-10-CM

## 2015-10-01 DIAGNOSIS — Z79899 Other long term (current) drug therapy: Secondary | ICD-10-CM | POA: Diagnosis not present

## 2015-10-01 DIAGNOSIS — R262 Difficulty in walking, not elsewhere classified: Secondary | ICD-10-CM | POA: Diagnosis not present

## 2015-10-01 DIAGNOSIS — R531 Weakness: Secondary | ICD-10-CM | POA: Diagnosis not present

## 2015-10-01 DIAGNOSIS — Z9049 Acquired absence of other specified parts of digestive tract: Secondary | ICD-10-CM | POA: Diagnosis not present

## 2015-10-01 DIAGNOSIS — E876 Hypokalemia: Secondary | ICD-10-CM | POA: Insufficient documentation

## 2015-10-01 DIAGNOSIS — M5489 Other dorsalgia: Principal | ICD-10-CM | POA: Insufficient documentation

## 2015-10-01 DIAGNOSIS — R0781 Pleurodynia: Secondary | ICD-10-CM | POA: Diagnosis not present

## 2015-10-01 DIAGNOSIS — R55 Syncope and collapse: Secondary | ICD-10-CM | POA: Insufficient documentation

## 2015-10-01 DIAGNOSIS — S299XXA Unspecified injury of thorax, initial encounter: Secondary | ICD-10-CM | POA: Diagnosis not present

## 2015-10-01 DIAGNOSIS — IMO0002 Reserved for concepts with insufficient information to code with codable children: Secondary | ICD-10-CM

## 2015-10-01 DIAGNOSIS — K449 Diaphragmatic hernia without obstruction or gangrene: Secondary | ICD-10-CM | POA: Insufficient documentation

## 2015-10-01 DIAGNOSIS — R799 Abnormal finding of blood chemistry, unspecified: Secondary | ICD-10-CM | POA: Diagnosis not present

## 2015-10-01 LAB — CBC WITH DIFFERENTIAL/PLATELET
Basophils Absolute: 0 10*3/uL (ref 0–0.1)
Basophils Relative: 0 %
Eosinophils Absolute: 0.1 10*3/uL (ref 0–0.7)
Eosinophils Relative: 1 %
HCT: 33.5 % — ABNORMAL LOW (ref 35.0–47.0)
Hemoglobin: 11.3 g/dL — ABNORMAL LOW (ref 12.0–16.0)
Lymphocytes Relative: 26 %
Lymphs Abs: 2.3 10*3/uL (ref 1.0–3.6)
MCH: 29.8 pg (ref 26.0–34.0)
MCHC: 33.9 g/dL (ref 32.0–36.0)
MCV: 88.1 fL (ref 80.0–100.0)
Monocytes Absolute: 0.8 10*3/uL (ref 0.2–0.9)
Monocytes Relative: 9 %
Neutro Abs: 5.5 10*3/uL (ref 1.4–6.5)
Neutrophils Relative %: 64 %
Platelets: 173 10*3/uL (ref 150–440)
RBC: 3.8 MIL/uL (ref 3.80–5.20)
RDW: 14.3 % (ref 11.5–14.5)
WBC: 8.8 10*3/uL (ref 3.6–11.0)

## 2015-10-01 LAB — COMPREHENSIVE METABOLIC PANEL
ALT: 76 U/L — AB (ref 14–54)
ANION GAP: 9 (ref 5–15)
AST: 40 U/L (ref 15–41)
Albumin: 3.5 g/dL (ref 3.5–5.0)
Alkaline Phosphatase: 101 U/L (ref 38–126)
BUN: 24 mg/dL — ABNORMAL HIGH (ref 6–20)
CHLORIDE: 104 mmol/L (ref 101–111)
CO2: 24 mmol/L (ref 22–32)
CREATININE: 0.96 mg/dL (ref 0.44–1.00)
Calcium: 8.6 mg/dL — ABNORMAL LOW (ref 8.9–10.3)
GFR, EST AFRICAN AMERICAN: 60 mL/min — AB (ref >60)
GFR, EST NON AFRICAN AMERICAN: 52 mL/min — AB (ref >60)
Glucose, Bld: 97 mg/dL (ref 65–99)
POTASSIUM: 3.4 mmol/L — AB (ref 3.5–5.1)
SODIUM: 137 mmol/L (ref 135–145)
Total Bilirubin: 0.5 mg/dL (ref 0.3–1.2)
Total Protein: 6.5 g/dL (ref 6.5–8.1)

## 2015-10-01 LAB — TROPONIN I
TROPONIN I: 0.16 ng/mL — AB (ref <0.031)
Troponin I: <0.03 ng/mL (ref <0.031)

## 2015-10-01 LAB — PROTIME-INR
INR: 1.14
Prothrombin Time: 14.8 seconds (ref 11.4–15.0)

## 2015-10-01 LAB — LIPASE, BLOOD: LIPASE: 20 U/L (ref 11–51)

## 2015-10-01 MED ORDER — LEVOTHYROXINE SODIUM 50 MCG PO TABS
50.0000 ug | ORAL_TABLET | Freq: Every day | ORAL | Status: DC
  Administered 2015-10-02: 50 ug via ORAL
  Filled 2015-10-01 (×2): qty 1

## 2015-10-01 MED ORDER — ENOXAPARIN SODIUM 40 MG/0.4ML ~~LOC~~ SOLN
40.0000 mg | SUBCUTANEOUS | Status: DC
  Administered 2015-10-01: 40 mg via SUBCUTANEOUS
  Filled 2015-10-01: qty 0.4

## 2015-10-01 MED ORDER — ASPIRIN EC 81 MG PO TBEC
81.0000 mg | DELAYED_RELEASE_TABLET | Freq: Every day | ORAL | Status: DC
  Administered 2015-10-02: 81 mg via ORAL
  Filled 2015-10-01: qty 1

## 2015-10-01 MED ORDER — NITROGLYCERIN 0.4 MG SL SUBL: 0.4000 mg | SUBLINGUAL_TABLET | SUBLINGUAL | Status: DC | PRN

## 2015-10-01 MED ORDER — MORPHINE SULFATE (PF) 2 MG/ML IV SOLN
2.0000 mg | Freq: Once | INTRAVENOUS | Status: AC
  Administered 2015-10-01: 2 mg via INTRAVENOUS

## 2015-10-01 MED ORDER — AMITRIPTYLINE HCL 50 MG PO TABS
50.0000 mg | ORAL_TABLET | Freq: Every day | ORAL | Status: DC
  Administered 2015-10-01: 50 mg via ORAL
  Filled 2015-10-01: qty 1

## 2015-10-01 MED ORDER — CITALOPRAM HYDROBROMIDE 20 MG PO TABS
20.0000 mg | ORAL_TABLET | Freq: Every day | ORAL | Status: DC
  Administered 2015-10-02: 20 mg via ORAL
  Filled 2015-10-01: qty 1

## 2015-10-01 MED ORDER — IOPAMIDOL (ISOVUE-300) INJECTION 61%
100.0000 mL | Freq: Once | INTRAVENOUS | Status: AC | PRN
Start: 2015-10-01 — End: 2015-10-01
  Administered 2015-10-01: 100 mL via INTRAVENOUS
  Filled 2015-10-01: qty 100

## 2015-10-01 MED ORDER — HYDROCODONE-ACETAMINOPHEN 5-325 MG PO TABS
0.5000 | ORAL_TABLET | ORAL | Status: DC | PRN
  Administered 2015-10-01 – 2015-10-02 (×2): 1 via ORAL
  Filled 2015-10-01 (×2): qty 1

## 2015-10-01 MED ORDER — ACETAMINOPHEN 650 MG RE SUPP: 650.0000 mg | Freq: Four times a day (QID) | RECTAL | Status: DC | PRN

## 2015-10-01 MED ORDER — SODIUM CHLORIDE 0.9 % IV SOLN
INTRAVENOUS | Status: DC
  Administered 2015-10-01: 22:00:00 via INTRAVENOUS

## 2015-10-01 MED ORDER — DIATRIZOATE MEGLUMINE & SODIUM 66-10 % PO SOLN
15.0000 mL | Freq: Once | ORAL | Status: AC
  Administered 2015-10-01: 15 mL via ORAL

## 2015-10-01 MED ORDER — MORPHINE SULFATE (PF) 2 MG/ML IV SOLN: 1.0000 mg | INTRAVENOUS | Status: DC | PRN

## 2015-10-01 MED ORDER — ATORVASTATIN CALCIUM 20 MG PO TABS
20.0000 mg | ORAL_TABLET | Freq: Every day | ORAL | Status: DC
  Administered 2015-10-01: 20 mg via ORAL
  Filled 2015-10-01: qty 1

## 2015-10-01 MED ORDER — HYDROCODONE-ACETAMINOPHEN 5-325 MG PO TABS: 0.5000 | ORAL_TABLET | Freq: Three times a day (TID) | ORAL | Status: DC | PRN

## 2015-10-01 MED ORDER — PANTOPRAZOLE SODIUM 40 MG PO TBEC
40.0000 mg | DELAYED_RELEASE_TABLET | Freq: Every day | ORAL | Status: DC
  Administered 2015-10-02: 40 mg via ORAL
  Filled 2015-10-01: qty 1
  Filled 2015-10-01: qty 2

## 2015-10-01 MED ORDER — SODIUM CHLORIDE 0.9% FLUSH
3.0000 mL | Freq: Two times a day (BID) | INTRAVENOUS | Status: DC
  Administered 2015-10-01 – 2015-10-02 (×2): 3 mL via INTRAVENOUS

## 2015-10-01 MED ORDER — ACETAMINOPHEN ER 650 MG PO TBCR: 650.0000 mg | EXTENDED_RELEASE_TABLET | Freq: Three times a day (TID) | ORAL | Status: DC | PRN

## 2015-10-01 MED ORDER — ACETAMINOPHEN 325 MG PO TABS: 650.0000 mg | ORAL_TABLET | Freq: Four times a day (QID) | ORAL | Status: DC | PRN

## 2015-10-01 MED ORDER — FLUDROCORTISONE ACETATE 0.1 MG PO TABS
0.1000 mg | ORAL_TABLET | Freq: Every day | ORAL | Status: DC
  Administered 2015-10-02: 0.1 mg via ORAL
  Filled 2015-10-01: qty 1

## 2015-10-01 MED ORDER — MORPHINE SULFATE (PF) 2 MG/ML IV SOLN
INTRAVENOUS | Status: AC
  Administered 2015-10-01: 2 mg via INTRAVENOUS
  Filled 2015-10-01: qty 1

## 2015-10-01 NOTE — ED Notes (Signed)
Pt states that she fell Wednesday, states that she thinks she stumbled and couldn't catch herself, pt states that she is hurting worse, states that she is having back pain, leg and buttock pain

## 2015-10-01 NOTE — ED Notes (Signed)
Dr. Burlene Arnt notified of critical trop

## 2015-10-01 NOTE — ED Provider Notes (Signed)
[facility] Emergency Department Provider Note  ____________________________________________   I have reviewed the triage vital signs and the nursing notes.   HISTORY  Chief Complaint Fall    HPI Donne Wade is a 82 y.o. female presents today complaining of low back pain which is chronic but much worse and she fell, abdominal pain, side pain, mild headache and feeling generally unwell and [place] vomiting twice today after a fall on Wednesday. Patient states she was coming in from the house not using her walker or her cane and does not exactly know how she fell on the floor. She remembers reaching for something. She does not believe she passed out but she is not sure. She cannot tell me exactly why she fell. She denies chest pain or shortness of breath at this time. She does not remember if she had any before the fall but she thinks she did not. Family are at the bedside and they are in concordance with her history and feel that she is at baseline. Patient has been trying to get around the house since the fall but has had increased pain in his mention vomited. She denies any chest pain or shortness of breath since the fall. She did not have any recollected Injury but she states "I am not sure". She states that she does have a mild headache.She denies any focal numbness or weakness. Patient does have a history of kyphoplasty in the past.      Past Medical History  Diagnosis Date  . CAD (coronary artery disease)   . Orthostatic hypotension   . Syncope   . Breast cancer ([place])   . GERD (gastroesophageal reflux disease)   . Osteoarthritis   . Degenerative disc disease   . Hypothyroidism   . Hyperlipidemia     Previous intolerance of statins  . Peptic ulcer disease     Patient Active Problem List   Diagnosis Date Noted  . Right carotid bruit 08/01/2015  . Loss of weight 01/12/2015  . Anemia 07/18/2014  . Health care maintenance 07/18/2014  .  Hoarseness 07/18/2014  . Abdominal pain 05/05/2014  . Back pain 10/05/2013  . Knee pain 07/14/2013  . Oral pain 05/06/2013  . Itching 01/05/2013  . Breast cancer ([place]) 11/20/2012  . Syncope 11/20/2012  . Hypothyroidism 08/10/2012  . Difficulty sleeping 08/10/2012  . Dizziness 11/08/2010  . Hyperlipemia 03/23/2009  . HYPERTENSION, BENIGN 03/23/2009  . CAD, NATIVE VESSEL 09/24/2008  . ORTHOSTATIC HYPOTENSION 09/24/2008  . ABNORMAL EKG 09/24/2008    Past Surgical History  Procedure Laterality Date  . Breast lumpectomy  2006  . Abdominal hysterectomy  1970  . Nasal sinus surgery    . Rotator cuff repair    . Appendectomy    . Coronary artery bypass graft  05/2006    x3  . Cholecystectomy    . Biopsy breast  2014  . Breast excisional biopsy Left 2014    neg  . Breast excisional biopsy Left 2006    postive radation    Current Outpatient Rx  Name  Route  Sig  Dispense  Refill  . acetaminophen (TYLENOL) 650 MG CR tablet   Oral   Take 650 mg by mouth every 8 (eight) hours as needed.           Marland Kitchen amitriptyline (ELAVIL) 50 MG tablet      TAKE 1 TABLET BY MOUTH AT BEDTIME   90 tablet   3   . aspirin 81 MG  EC tablet   Oral   Take 81 mg by mouth daily.           Marland Kitchen atorvastatin (LIPITOR) 20 MG tablet   Oral   Take 1 tablet (20 mg total) by mouth daily.   90 tablet   3   . citalopram (CELEXA) 20 MG tablet      Take 1 tablet by mouth  daily   90 tablet   0   . fludrocortisone (FLORINEF) 0.1 MG tablet   Oral   Take 1 tablet (0.1 mg total) by mouth daily.   90 tablet   3   . HYDROcodone-acetaminophen (NORCO/VICODIN) 5-325 MG per tablet   Oral   Take 5-325 tablets by mouth 2 (two) times daily as needed.      0   . lansoprazole (PREVACID) 30 MG capsule   Oral   Take 30 mg by mouth daily.           Marland Kitchen levothyroxine (SYNTHROID, LEVOTHROID) 50 MCG tablet      Take 1 tablet by mouth  daily   90 tablet   0   . naproxen sodium (ANAPROX) 220 MG tablet    Oral   Take 220 mg by mouth as needed.          . nitroGLYCERIN (NITROSTAT) 0.4 MG SL tablet   Sublingual   Place 1 tablet (0.4 mg total) under the tongue every 5 (five) minutes as needed for chest pain.   25 tablet   0     Allergies Alendronate sodium and Prednisone  Family History  Problem Relation Age of Onset  . Heart attack Sister   . Pancreatic cancer Brother     Social History Social History  Substance Use Topics  . Smoking status: Never Smoker   . Smokeless tobacco: Never Used  . Alcohol Use: No    Review of Systems Constitutional: No fever/chills Eyes: No visual changes. ENT: No sore throat. No stiff neck no neck pain Cardiovascular: Denies chest pain. Respiratory: Denies shortness of breath. Gastrointestinal:   no vomiting.  No diarrhea.  No constipation. Genitourinary: Negative for dysuria. Musculoskeletal: Negative lower extremity swelling Skin: Negative for rash. Neurological: Negative for headaches, focal weakness or numbness. 10-point ROS otherwise negative.  ____________________________________________   PHYSICAL EXAM:  VITAL SIGNS: ED Triage Vitals  Enc Vitals Group     BP 10/01/15 1054 121/50 mmHg     Pulse Rate 10/01/15 1054 87     Resp 10/01/15 1054 16     Temp 10/01/15 1054 99 F (37.2 C)     Temp Source 10/01/15 1054 Oral     SpO2 10/01/15 1054 98 %     Weight 10/01/15 1054 122 lb (55.339 kg)     Height 10/01/15 1054 5\' 2"  (1.575 m)     Head Cir --      Peak Flow --      Pain Score 10/01/15 1444 9     Pain Loc --      Pain Edu? --      Excl. in [place]? --     Constitutional: Alert and oriented. Well appearing and in no acute distress. Eyes: Conjunctivae are normal. PERRL. EOMI. Head: Atraumatic. Nose: No congestion/rhinnorhea. Mouth/Throat: Mucous membranes are moist.  Oropharynx non-erythematous. Neck: No stridor.   Nontender with no meningismus Cardiovascular: Normal rate, regular rhythm. Grossly normal heart sounds.  Good  peripheral circulation. Respiratory: Normal respiratory effort.  No retractions. Lungs CTAB. Abdominal: Minimal tenderness to palpation  of the left lower quadrant No distention. No guarding no rebound Back:  Patient with midline tenderness diffusely in the lumbar region. No obvious step-off or deformity there are no lesions noted. there is no CVA tenderness Musculoskeletal: No lower extremity tenderness. No joint effusions, no DVT signs strong distal pulses no edema Neurologic:  Normal speech and language. No gross focal neurologic deficits are appreciated.  Skin:  Skin is warm, dry and intact. No rash noted. Psychiatric: Mood and affect are normal. Speech and behavior are normal.  ____________________________________________   LABS (all labs ordered are listed, but only abnormal results are displayed)  Labs Reviewed  COMPREHENSIVE METABOLIC PANEL - Abnormal; Notable for the following:    Potassium 3.4 (*)    BUN 24 (*)    Calcium 8.6 (*)    ALT 76 (*)    GFR calc non Af Amer 52 (*)    GFR calc Af Amer 60 (*)    All other components within normal limits  TROPONIN I - Abnormal; Notable for the following:    Troponin I 0.16 (*)    All other components within normal limits  CBC WITH DIFFERENTIAL/PLATELET - Abnormal; Notable for the following:    Hemoglobin 11.3 (*)    HCT 33.5 (*)    All other components within normal limits  LIPASE, BLOOD  PROTIME-INR  URINALYSIS COMPLETEWITH MICROSCOPIC ([facility] ONLY)   ____________________________________________  EKG  I personally interpreted any EKGs ordered by me or triage Normal sinus rhythm at 75 bpm no acute ST elevation or depression normal EKG ____________________________________________  RADIOLOGY  I reviewed any imaging ordered by me or triage that were performed during my shift and, if possible, patient and/or family made aware of any abnormal findings. ____________________________________________   PROCEDURES  Procedure(s)  performed: None  Critical Care performed: None  ____________________________________________   INITIAL IMPRESSION / ASSESSMENT AND PLAN / ED COURSE  Pertinent labs & imaging results that were available during my care of the patient were reviewed by me and considered in my medical decision making (see chart for details).  Patient with a fall 2 days ago. She cannot tell me exactly why she fell and she is very hazy about the details. For this reason, we have done a fairly extensive workup. EKG is reassuring. Patient does have  a troponin of [id number] which certainly could be consistent with a subacute cardiac pathology although at this time there is no evidence of an acute cardiac pathology given EKG and absence of chest pain. I did do a CT scan as she is complaining of very diffuse and different pains and I can't tell what is radiating where, including abdominal pain. CT scan shows no acute injury or intra-abdominal pathology which is reassuring and not unexpected. We have given her pain medication here. Given her positive troponin, we will discuss with the hospitalist observing the patient for possible syncopal event ____________________________________________   FINAL CLINICAL IMPRESSION(S) / ED DIAGNOSES  Final diagnoses:  None      This chart was dictated using voice recognition software.  Despite best efforts to proofread,  errors can occur which can change meaning.     Schuyler Amor, MD 10/01/15 [phone]

## 2015-10-01 NOTE — H&P (Addendum)
[facility] [place] NAME: Angela Howe    MR#:  [id number]  DATE OF BIRTH:  10/05/1925  DATE OF ADMISSION:  10/01/2015  PRIMARY CARE PHYSICIAN: Einar Pheasant, MD   REQUESTING/REFERRING PHYSICIAN: Dr Charlotte Crumb  CHIEF COMPLAINT:   Chief Complaint  Patient presents with  . Fall    HISTORY OF PRESENT ILLNESS:  Angela Howe  is a 82 y.o. female presents with back pain. She had a fall on Wednesday where she got wobbly while walking. She lost her balance and fell. She was trying to hold onto something but missed it with her hand and fell to the floor. No loss of consciousness. Complaining of 10 out of 10 mid back pain. Her right side is hurting. In the ER, the ER physician thought syncope could be an issue and the elevated troponin and hospitalist services were contacted for further evaluation. The patient did not lose consciousness. She has a history of orthostatic hypotension and takes Florinef. Patient has no complaints of chest pain or shortness of breath. Her major complaint is back pain  PAST MEDICAL HISTORY:   Past Medical History  Diagnosis Date  . CAD (coronary artery disease)   . Orthostatic hypotension   . Syncope   . Breast cancer ([place])   . GERD (gastroesophageal reflux disease)   . Osteoarthritis   . Degenerative disc disease   . Hypothyroidism   . Hyperlipidemia     Previous intolerance of statins  . Peptic ulcer disease     PAST SURGICAL HISTORY:   Past Surgical History  Procedure Laterality Date  . Breast lumpectomy  2006  . Abdominal hysterectomy  1970  . Nasal sinus surgery    . Rotator cuff repair    . Appendectomy    . Coronary artery bypass graft  05/2006    x3  . Cholecystectomy    . Biopsy breast  2014  . Breast excisional biopsy Left 2014    neg  . Breast excisional biopsy Left 2006    postive radation    SOCIAL HISTORY:   Social History  Substance Use Topics  . Smoking status: Never  Smoker   . Smokeless tobacco: Never Used  . Alcohol Use: No    FAMILY HISTORY:   Family History  Problem Relation Age of Onset  . Heart attack Sister   . Pancreatic cancer Brother     DRUG ALLERGIES:   Allergies  Allergen Reactions  . Alendronate Sodium Other (See Comments)    Reaction:  Restlessness   . Prednisone Other (See Comments)    Reaction:  Restlessness     REVIEW OF SYSTEMS:  CONSTITUTIONAL: No fever, fatigue or weakness. Some weight loss EYES: No blurred or double vision. Wears glasses EARS, NOSE, AND THROAT: No tinnitus or ear pain. No sore throat. Decreased hearing RESPIRATORY: No cough, shortness of breath, wheezing or hemoptysis.  CARDIOVASCULAR: No chest pain, orthopnea, edema.  GASTROINTESTINAL: Positive for nausea and vomiting this morning. no diarrhea. Some lower abdominal pain. No blood in bowel movements GENITOURINARY: No dysuria, hematuria.  ENDOCRINE: No polyuria, nocturia,  HEMATOLOGY: No anemia, easy bruising or bleeding SKIN: No rash or lesion. MUSCULOSKELETAL: Back and knee pain.   NEUROLOGIC: History of syncope in the past PSYCHIATRY: No anxiety or depression.   MEDICATIONS AT HOME:   Prior to Admission medications   Medication Sig Start Date End Date Taking? Authorizing Provider  acetaminophen (TYLENOL) 650 MG CR tablet Take 650 mg by  mouth every 8 (eight) hours as needed for pain.    Yes Historical Provider, MD  amitriptyline (ELAVIL) 50 MG tablet Take 50 mg by mouth at bedtime.   Yes Historical Provider, MD  aspirin EC 81 MG tablet Take 81 mg by mouth daily.   Yes Historical Provider, MD  atorvastatin (LIPITOR) 20 MG tablet Take 20 mg by mouth at bedtime.   Yes Historical Provider, MD  citalopram (CELEXA) 20 MG tablet Take 20 mg by mouth daily.   Yes Historical Provider, MD  fludrocortisone (FLORINEF) 0.1 MG tablet Take 1 tablet (0.1 mg total) by mouth daily. 03/22/15  Yes Einar Pheasant, MD  HYDROcodone-acetaminophen (NORCO/VICODIN)  5-325 MG per tablet Take 0.5-1 tablets by mouth 3 (three) times daily as needed for moderate pain.    Yes Historical Provider, MD  lansoprazole (PREVACID) 30 MG capsule Take 30 mg by mouth daily.     Yes Historical Provider, MD  levothyroxine (SYNTHROID, LEVOTHROID) 50 MCG tablet Take 50 mcg by mouth daily before breakfast.   Yes Historical Provider, MD  nitroGLYCERIN (NITROSTAT) 0.4 MG SL tablet Place 1 tablet (0.4 mg total) under the tongue every 5 (five) minutes as needed for chest pain. 07/10/14  Yes Einar Pheasant, MD      VITAL SIGNS:  Blood pressure 142/98, pulse 75, temperature 99 F (37.2 C), temperature source Oral, resp. rate 18, height 5\' 2"  (1.575 m), weight 55.339 kg (122 lb), SpO2 100 %.  PHYSICAL EXAMINATION:  GENERAL:  82 y.o.-year-old patient lying in the bed with no acute distress.  EYES: Pupils equal, round, reactive to light and accommodation. No scleral icterus. Extraocular muscles intact.  HEENT: Head atraumatic, normocephalic. Oropharynx and nasopharynx clear.  NECK:  Supple, no jugular venous distention. No thyroid enlargement, no tenderness.  LUNGS: Normal breath sounds bilaterally, no wheezing, rales,rhonchi or crepitation. No use of accessory muscles of respiration.  CARDIOVASCULAR: S1, S2 normal. No murmurs, rubs, or gallops.  ABDOMEN: Soft, nontender, nondistended. Bowel sounds present. No organomegaly or mass.  EXTREMITIES: No pedal edema, cyanosis, or clubbing. Pain to palpation over right posterior ribs. Unable to bend or twist very well. Also pain bilateral sacroiliac areas. Questionable pain over the thoracic spine in that area. NEUROLOGIC: Cranial nerves II through XII are intact. Muscle strength 5/5 in all extremities. Sensation intact. Gait not checked.  PSYCHIATRIC: The patient is alert and oriented x 3.  SKIN: No rash, lesion, or ulcer.   LABORATORY PANEL:   CBC  Recent Labs Lab 10/01/15 1442  WBC 8.8  HGB 11.3*  HCT 33.5*  PLT 173    ------------------------------------------------------------------------------------------------------------------  Chemistries   Recent Labs Lab 10/01/15 1442  NA 137  K 3.4*  CL 104  CO2 24  GLUCOSE 97  BUN 24*  CREATININE 0.96  CALCIUM 8.6*  AST 40  ALT 76*  ALKPHOS 101  BILITOT 0.5   ------------------------------------------------------------------------------------------------------------------  Cardiac Enzymes  Recent Labs Lab 10/01/15 1442  TROPONINI 0.16*   ------------------------------------------------------------------------------------------------------------------  RADIOLOGY:  Dg Chest 2 View  10/01/2015  CLINICAL DATA:  82 year old female who fell 3 days ago from standing with pain. Initial encounter. EXAM: CHEST  2 VIEW COMPARISON:  07/17/2014 and earlier. FINDINGS: Larger lung volumes, with mild hyperinflation. Chronic left lung base scarring or atelectasis is stable. Chronic gastric hiatal hernia is more apparent. Other mediastinal contours are within normal limits. Sequelae of CABG. Visualized tracheal air column is within normal limits. No pneumothorax, pulmonary edema, pleural effusion or acute pulmonary opacity. Osteopenia with multilevel thoracic spine  compression fractures which appear stable with the exception of what appears to be the T9 level which was minimally to mildly compressed in 2016 and now moderately compressed (arrow). No other acute osseous abnormality identified. Stable cholecystectomy clips. IMPRESSION: 1. Age indeterminate T9 compression fracture, superimposed on numerous chronic thoracic compression fractures in the setting of chronic osteopenia. If specific therapy such as vertebroplasty is desired, thoracic MRI or Nuclear Medicine Whole-body Bone Scan would best confirm acuity. 2. No other acute findings in the chest.  Chronic hiatal hernia. Electronically Signed   By: Genevie Ann M.D.   On: 10/01/2015 16:37   Ct Head Wo  Contrast  10/01/2015  CLINICAL DATA:  Fall. EXAM: CT HEAD WITHOUT CONTRAST TECHNIQUE: Contiguous axial images were obtained from the base of the skull through the vertex without intravenous contrast. COMPARISON:  07/17/2014 FINDINGS: There is mild diffuse low-attenuation within the subcortical and periventricular white matter compatible with chronic microvascular disease. No acute cortical infarct, hemorrhage, or mass lesion ispresent. Ventricles are of normal size. No significant extra-axial fluid collection is present. The paranasal sinuses andmastoid air cells are clear. The osseous skull is intact. IMPRESSION: 1. No acute intracranial abnormalities. 2. Chronic microvascular disease and brain atrophy. Electronically Signed   By: Kerby Moors M.D.   On: 10/01/2015 16:08   Ct Abdomen Pelvis W Contrast  10/01/2015  CLINICAL DATA:  Fall. EXAM: CT ABDOMEN AND PELVIS WITH CONTRAST TECHNIQUE: Multidetector CT imaging of the abdomen and pelvis was performed using the standard protocol following bolus administration of intravenous contrast. CONTRAST:  164mL ISOVUE-300 IOPAMIDOL (ISOVUE-300) INJECTION 61% COMPARISON:  10/22/2013 FINDINGS: Lower chest: There is no pleural fluid identified. Moderate size hiatal hernia is identified. Scar like densities are noted within the lower lobes. Hepatobiliary: Previous cholecystectomy. Mild intrahepatic and extrahepatic bile duct dilatation. The CBD measures 8 mm in maximum diameter. Pancreas: No mass, inflammatory changes, or other significant abnormality. Spleen: Within normal limits in size and appearance. Adrenals/Urinary Tract: The adrenal glands are normal. Normal appearance of the right kidney. There is a cyst within the left kidney measuring 2.5 cm. Urinary bladder appears unremarkable. Stomach/Bowel: The small bowel loops have a normal course and caliber. No bowel obstruction. Normal appearance of the proximal colon. Numerous distal colonic diverticula identified. No  acute inflammation. There is a moderate stool burden identified throughout the colon. Vascular/Lymphatic: Aortic atherosclerosis noted. No enlarged retroperitoneal or mesenteric adenopathy. No enlarged pelvic or inguinal lymph nodes. Reproductive: Previous hysterectomy.  No adnexal mass. Other: There is no ascites or focal fluid collections within the abdomen or pelvis. Musculoskeletal: Compression fractures are again identified involving T11, L3, and L4. The L3 and L4 fractures have been treated with bone cement. No new compression fracture since 05/17/2014. Degenerative disc disease noted at L5-S1. There is osteoarthritis involving the hip joints, right greater than left. IMPRESSION: 1. No acute findings identified within the abdomen or pelvis. 2. Chronic compression deformities involve the T11, L3 and L4 vertebra. 3. Aortic atherosclerosis 4. Hiatal hernia 5. Chronic increase caliber of the bile ducts status post cholecystectomy. Electronically Signed   By: Kerby Moors M.D.   On: 10/01/2015 16:17    EKG:   Normal sinus rhythm 75 bpm  IMPRESSION AND PLAN:   1. Severe back pain. Pain control with IV and oral medication. I will get an x-ray of the right posterior ribs to rule out fracture. MRI thoracic spine for further evaluation of compression fractures. Physical therapy evaluation. 2. Troponin slight elevation. No complaints of chest  pain or shortness of breath. Will have cardiology evaluate. Monitor on telemetry and serial cardiac enzymes. 3. History of orthostatic hypotension on Florinef. Check orthostatic vitals.Dr. Rockey Situ did a 30 day monitor in the past. 4. Hypokalemia replace potassium orally 5. Hyperlipidemia unspecified on statin 6. Gastroesophageal reflux disease without esophagitis on PPI 7. Anxiety depression on psychiatric medication  All the records are reviewed and case discussed with ED provider. Management plans discussed with the patient, family and they are in  agreement.  CODE STATUS: DO NOT RESUSCITATE  TOTAL TIME TAKING CARE OF THIS PATIENT: 50 minutes.    Loletha Grayer M.D on 10/01/2015 at 5:38 PM  Between 7am to 6pm - Pager - [phone]  After 6pm call admission pager [phone]  [facility] Office  [phone]  CC: Primary care physician; Einar Pheasant, MD

## 2015-10-02 DIAGNOSIS — S2239XA Fracture of one rib, unspecified side, initial encounter for closed fracture: Secondary | ICD-10-CM | POA: Diagnosis not present

## 2015-10-02 LAB — URINALYSIS COMPLETE WITH MICROSCOPIC (ARMC ONLY)
BILIRUBIN URINE: NEGATIVE
GLUCOSE, UA: NEGATIVE mg/dL
HGB URINE DIPSTICK: NEGATIVE
KETONES UR: NEGATIVE mg/dL
NITRITE: NEGATIVE
PH: 5 (ref 5.0–8.0)
Protein, ur: NEGATIVE mg/dL
Specific Gravity, Urine: 1.024 (ref 1.005–1.030)
Squamous Epithelial / LPF: NONE SEEN

## 2015-10-02 MED ORDER — HYDROCODONE-ACETAMINOPHEN 5-325 MG PO TABS: 0.5000 | ORAL_TABLET | Freq: Three times a day (TID) | ORAL | Status: DC | PRN

## 2015-10-02 NOTE — Evaluation (Signed)
Physical Therapy Evaluation Patient Details Name: Angela Howe MRN: [id number] DOB: 1925/06/12 Today's Date: 10/02/2015   History of Present Illness  82 y/o female had a fall suffering R 8th rib fracture on top of chronic mid/low back issues.  Clinical Impression  Pt is able to do some limited ambulation (does not do a lot at baseline) and was safe and apparently not far from her baseline (has slow, deliberate gait, R foot eversion, moderate reliance on AD).  She was able to get up to EOB and to standing w/o direct physical assist and though she is having R flank pain from rib fx she is overall functional and should be able to go home with some assist from family and with HHPT.     Follow Up Recommendations Home health PT    Equipment Recommendations       Recommendations for Other Services       Precautions / Restrictions Precautions Precautions: Fall Restrictions Weight Bearing Restrictions: No      Mobility  Bed Mobility Overal bed mobility: Modified Independent             General bed mobility comments: Pt able to get to EOB w/o excessive use of rails and with slow but deliberate effort  Transfers Overall transfer level: Modified independent Equipment used: Rolling walker (2 wheeled)             General transfer comment: Pt able to rise to standing w/o direct assist.  she is not overly reliant on the walker to maintain balance and overall is safe and relatively confident  Ambulation/Gait Ambulation/Gait assistance: Supervision Ambulation Distance (Feet): 60 Feet Assistive device: Rolling walker (2 wheeled)       General Gait Details: Pt did well with ambulation into the hallway with FWW.  She is used to rollator so has some issues with turning, but ultimately was safe and consistent with the effort.  She rarely does more than just a few minutes of walking at a time at baseline and did not have any safety concerns at this time with balance or excessive  fatigue.  Stairs            Wheelchair Mobility    Modified Rankin (Stroke Patients Only)       Balance                                             Pertinent Vitals/Pain Pain Assessment: 0-10 (minimal at rest) Pain Score: 8  Pain Location: R 8th rib, R knee, chronic LBP    Home Living Family/patient expects to be discharged to:: Private residence Living Arrangements: Alone Available Help at Discharge: Family (daughter and grandson live close and do assist)   Home Access: Level entry       Home Equipment: Environmental consultant - 4 wheels;Cane - single point      Prior Function Level of Independence: Independent with assistive device(s)         Comments: Pt does not drive or do a lot of community activites but goes to church with family and can go out to eat.  She dresses and bathes herself, and does light housework.     Hand Dominance        Extremity/Trunk Assessment   Upper Extremity Assessment: Generalized weakness;Overall WFL for tasks assessed (age appropriate deficits)  Lower Extremity Assessment: Generalized weakness (R LE grossly 3+/5, L grossly 4-/5)         Communication   Communication: No difficulties  Cognition Arousal/Alertness: Awake/alert Behavior During Therapy: WFL for tasks assessed/performed Overall Cognitive Status: Within Functional Limits for tasks assessed                      General Comments      Exercises        Assessment/Plan    PT Assessment Patient needs continued PT services  PT Diagnosis Generalized weakness;Difficulty walking;Acute pain   PT Problem List Decreased strength;Decreased range of motion;Decreased activity tolerance;Decreased balance;Decreased mobility;Decreased coordination;Decreased knowledge of use of DME;Decreased safety awareness  PT Treatment Interventions DME instruction;Gait training;Therapeutic exercise;Therapeutic activities;Functional mobility  training;Balance training;Neuromuscular re-education   PT Goals (Current goals can be found in the Care Plan section) Acute Rehab PT Goals Patient Stated Goal: stop these ribs hurting PT Goal Formulation: With patient Time For Goal Achievement: 10/16/15 Potential to Achieve Goals: Fair    Frequency Min 2X/week   Barriers to discharge        Co-evaluation               End of Session Equipment Utilized During Treatment: Gait belt Activity Tolerance: Patient tolerated treatment well;Patient limited by fatigue Patient left: with bed alarm set;with family/visitor present;with call bell/phone within reach      Functional Assessment Tool Used: clinical judgement Functional Limitation: Mobility: Walking and moving around Mobility: Walking and Moving Around Current Status [id number]): At least 20 percent but less than 40 percent impaired, limited or restricted Mobility: Walking and Moving Around Goal Status [phone]): At least 1 percent but less than 20 percent impaired, limited or restricted    Time: 1225-1245 PT Time Calculation (min) (ACUTE ONLY): 20 min   Charges:   PT Evaluation $PT Eval Low Complexity: 1 Procedure     PT G Codes:   PT G-Codes **NOT FOR INPATIENT CLASS** Functional Assessment Tool Used: clinical judgement Functional Limitation: Mobility: Walking and moving around Mobility: Walking and Moving Around Current Status [id number]): At least 20 percent but less than 40 percent impaired, limited or restricted Mobility: Walking and Moving Around Goal Status [id number]): At least 1 percent but less than 20 percent impaired, limited or restricted    Kreg Shropshire, DPT  10/02/2015, 2:06 PM

## 2015-10-02 NOTE — Discharge Instructions (Signed)

## 2015-10-02 NOTE — Progress Notes (Signed)
PT TO BE DISCHARGED TO HOME TODAY. IV AND TELE REMOVED. [place] INSTRUCTIONSAND PRESCRIP GIVEN TO PT AND GRANDSON. [place]

## 2015-10-02 NOTE — Care Management Note (Signed)
Case Management Note  Patient Details  Name: Linzee Olesh MRN: [id number] Date of Birth: 10-Dec-1925  Subjective/Objective:   Discussed discharge planning and PT recommendation for HH-PT. Ms Silvas chose [facility]. A referral for home health PT was faxed to [facility].                  Action/Plan:   Expected Discharge Date:                  Expected Discharge Plan:     In-House Referral:     Discharge planning Services     Post Acute Care Choice:    Choice offered to:     DME Arranged:    DME Agency:     HH Arranged:    [place] Agency:     Status of Service:     Medicare Important Message Given:    Date Medicare IM Given:    Medicare IM give by:    Date Additional Medicare IM Given:    Additional Medicare Important Message give by:     If discussed at [place] of Stay Meetings, dates discussed:    Additional Comments:  Edward Trevino A, RN 10/02/2015, 1:39 PM

## 2015-10-03 DIAGNOSIS — S2239XA Fracture of one rib, unspecified side, initial encounter for closed fracture: Secondary | ICD-10-CM | POA: Diagnosis present

## 2015-10-03 NOTE — Discharge Summary (Signed)
[facility] [place] NAME: Angela Howe    MR#:  [id number]  DATE OF BIRTH:  02-02-1926  DATE OF ADMISSION:  10/01/2015 ADMITTING PHYSICIAN: Loletha Grayer, MD  DATE OF DISCHARGE: 10/02/2015  3:17 PM  PRIMARY CARE PHYSICIAN: Einar Pheasant, MD   ADMISSION DIAGNOSIS:  Syncope and collapse [R55] Pain [R52] Elevated troponin [R79.89] Compression fracture [T14.8] Fall, initial encounter [W19.XXXA]  DISCHARGE DIAGNOSIS:  Active Problems:   Back pain   Rib fracture   SECONDARY DIAGNOSIS:   Past Medical History  Diagnosis Date  . CAD (coronary artery disease)   . Orthostatic hypotension   . Syncope   . Breast cancer (Angela Howe)   . GERD (gastroesophageal reflux disease)   . Osteoarthritis   . Degenerative disc disease   . Hypothyroidism   . Hyperlipidemia     Previous intolerance of statins  . Peptic ulcer disease      ADMITTING HISTORY  Angela Howe is a 82 y.o. female presents with back pain. She had a fall on Wednesday where she got wobbly while walking. She lost her balance and fell. She was trying to hold onto something but missed it with her hand and fell to the floor. No loss of consciousness. Complaining of 10 out of 10 mid back pain. Her right side is hurting. In the ER, the ER physician thought syncope could be an issue and the elevated troponin and hospitalist services were contacted for further evaluation. The patient did not lose consciousness. She has a history of orthostatic hypotension and takes Florinef. Patient has no complaints of chest pain or shortness of breath. Her major complaint is back pain  HOSPITAL COURSE:   * Right eighth rib fracture due to fall Patient's rib x-ray showed right eighth rib fracture. This is well controlled with oral pain medications. She was seen by physical therapy and advised home health which has been set up. Discussed with patient and daughter at bedside regarding discharge plan.  Answered all questions.  * Chronic back pain with spinal fractures status post surgeries Seemed unchanged on CT scan. No need for MRI with no neurological deficits.  * Patient's Florinef was continued for her orthostatic hypotension. Follows with Dr. Rockey Situ of cardiology.  * Mild elevation of troponin was stable.  Stable for discharge home with home health physical therapy. Pain medications prescribed at discharge.  CONSULTS OBTAINED:  Treatment Team:  Wende Bushy, MD  DRUG ALLERGIES:   Allergies  Allergen Reactions  . Alendronate Sodium Other (See Comments)    Reaction:  Restlessness   . Prednisone Other (See Comments)    Reaction:  Restlessness     DISCHARGE MEDICATIONS:   Discharge Medication List as of 10/02/2015  2:32 PM    CONTINUE these medications which have CHANGED   Details  HYDROcodone-acetaminophen (NORCO/VICODIN) 5-325 MG tablet Take 0.5-1 tablets by mouth 3 (three) times daily as needed for moderate pain., Starting 10/02/2015, Until Discontinued, Print      CONTINUE these medications which have NOT CHANGED   Details  acetaminophen (TYLENOL) 650 MG CR tablet Take 650 mg by mouth every 8 (eight) hours as needed for pain. , Until Discontinued, Historical Med    amitriptyline (ELAVIL) 50 MG tablet Take 50 mg by mouth at bedtime., Until Discontinued, Historical Med    aspirin EC 81 MG tablet Take 81 mg by mouth daily., Until Discontinued, Historical Med    atorvastatin (LIPITOR) 20 MG tablet Take 20 mg by mouth at  bedtime., Until Discontinued, Historical Med    citalopram (CELEXA) 20 MG tablet Take 20 mg by mouth daily., Until Discontinued, Historical Med    fludrocortisone (FLORINEF) 0.1 MG tablet Take 1 tablet (0.1 mg total) by mouth daily., Starting 03/22/2015, Until Discontinued, Normal    lansoprazole (PREVACID) 30 MG capsule Take 30 mg by mouth daily.  , Until Discontinued, Historical Med    levothyroxine (SYNTHROID, LEVOTHROID) 50 MCG tablet Take 50 mcg  by mouth daily before breakfast., Until Discontinued, Historical Med    nitroGLYCERIN (NITROSTAT) 0.4 MG SL tablet Place 1 tablet (0.4 mg total) under the tongue every 5 (five) minutes as needed for chest pain., Starting 07/10/2014, Until Discontinued, Normal        Today   VITAL SIGNS:  Blood pressure 178/107, pulse 76, temperature 98.4 F (36.9 C), temperature source Oral, resp. rate 20, height 5\' 2"  (1.575 m), weight 54.704 kg (120 lb 9.6 oz), SpO2 96 %.  I/O:  No intake or output data in the 24 hours ending 10/03/15 1339  PHYSICAL EXAMINATION:  Physical Exam  GENERAL:  82 y.o.-year-old patient lying in the bed with no acute distress.  LUNGS: Normal breath sounds bilaterally, no wheezing, rales,rhonchi or crepitation. No use of accessory muscles of respiration.  CARDIOVASCULAR: S1, S2 normal. No murmurs, rubs, or gallops.  ABDOMEN: Soft, non-tender, non-distended. Bowel sounds present. No organomegaly or mass.  NEUROLOGIC: Moves all 4 extremities. PSYCHIATRIC: The patient is alert and oriented x 3.  SKIN: No obvious rash, lesion, or ulcer.  Right chest wall tenderness  DATA REVIEW:   CBC  Recent Labs Lab 10/01/15 1442  WBC 8.8  HGB 11.3*  HCT 33.5*  PLT 173    Chemistries   Recent Labs Lab 10/01/15 1442  NA 137  K 3.4*  CL 104  CO2 24  GLUCOSE 97  BUN 24*  CREATININE 0.96  CALCIUM 8.6*  AST 40  ALT 76*  ALKPHOS 101  BILITOT 0.5    Cardiac Enzymes  Recent Labs Lab 10/01/15 2249  TROPONINI <0.03    Microbiology Results  Results for orders placed or performed in visit on 05/02/13  Rapid Strep-A with Reflx     Status: None   Collection Time: 05/02/13  2:28 PM  Result Value Ref Range Status   Micro Text Report NEGATIVE  Final  Beta Strep Culture [facility])     Status: None   Collection Time: 05/02/13  2:28 PM  Result Value Ref Range Status   Micro Text Report   Final       SOURCE: THROAT    COMMENT                   NO BETA STREPTOCOCCUS  ISOLATED IN 48 HOURS   ANTIBIOTIC                                                        RADIOLOGY:  Dg Chest 2 View  10/01/2015  CLINICAL DATA:  82 year old female who fell 3 days ago from standing with pain. Initial encounter. EXAM: CHEST  2 VIEW COMPARISON:  07/17/2014 and earlier. FINDINGS: Larger lung volumes, with mild hyperinflation. Chronic left lung base scarring or atelectasis is stable. Chronic gastric hiatal hernia is more apparent. Other mediastinal contours are within normal limits. Sequelae of CABG. Visualized tracheal air  column is within normal limits. No pneumothorax, pulmonary edema, pleural effusion or acute pulmonary opacity. Osteopenia with multilevel thoracic spine compression fractures which appear stable with the exception of what appears to be the T9 level which was minimally to mildly compressed in 2016 and now moderately compressed (arrow). No other acute osseous abnormality identified. Stable cholecystectomy clips. IMPRESSION: 1. Age indeterminate T9 compression fracture, superimposed on numerous chronic thoracic compression fractures in the setting of chronic osteopenia. If specific therapy such as vertebroplasty is desired, thoracic MRI or Nuclear Medicine Whole-body Bone Scan would best confirm acuity. 2. No other acute findings in the chest.  Chronic hiatal hernia. Electronically Signed   By: Genevie Ann M.D.   On: 10/01/2015 16:37   Dg Ribs Unilateral Right  10/01/2015  CLINICAL DATA:  Pain after fall. EXAM: RIGHT RIBS - 2 VIEW COMPARISON:  Chest x-ray October 01, 2015 FINDINGS: There is a suggested nondisplaced fracture of the anterior lateral right eighth rib in the region of the patient's symptoms. IMPRESSION: Suggested nondisplaced right anterior lateral rib fracture. Electronically Signed   By: Dorise Bullion III M.D   On: 10/01/2015 18:24   Ct Head Wo Contrast  10/01/2015  CLINICAL DATA:  Fall. EXAM: CT HEAD WITHOUT CONTRAST TECHNIQUE: Contiguous axial images were  obtained from the base of the skull through the vertex without intravenous contrast. COMPARISON:  07/17/2014 FINDINGS: There is mild diffuse low-attenuation within the subcortical and periventricular white matter compatible with chronic microvascular disease. No acute cortical infarct, hemorrhage, or mass lesion ispresent. Ventricles are of normal size. No significant extra-axial fluid collection is present. The paranasal sinuses andmastoid air cells are clear. The osseous skull is intact. IMPRESSION: 1. No acute intracranial abnormalities. 2. Chronic microvascular disease and brain atrophy. Electronically Signed   By: Kerby Moors M.D.   On: 10/01/2015 16:08   Ct Abdomen Pelvis W Contrast  10/01/2015  CLINICAL DATA:  Fall. EXAM: CT ABDOMEN AND PELVIS WITH CONTRAST TECHNIQUE: Multidetector CT imaging of the abdomen and pelvis was performed using the standard protocol following bolus administration of intravenous contrast. CONTRAST:  136mL ISOVUE-300 IOPAMIDOL (ISOVUE-300) INJECTION 61% COMPARISON:  10/22/2013 FINDINGS: Lower chest: There is no pleural fluid identified. Moderate size hiatal hernia is identified. Scar like densities are noted within the lower lobes. Hepatobiliary: Previous cholecystectomy. Mild intrahepatic and extrahepatic bile duct dilatation. The CBD measures 8 mm in maximum diameter. Pancreas: No mass, inflammatory changes, or other significant abnormality. Spleen: Within normal limits in size and appearance. Adrenals/Urinary Tract: The adrenal glands are normal. Normal appearance of the right kidney. There is a cyst within the left kidney measuring 2.5 cm. Urinary bladder appears unremarkable. Stomach/Bowel: The small bowel loops have a normal course and caliber. No bowel obstruction. Normal appearance of the proximal colon. Numerous distal colonic diverticula identified. No acute inflammation. There is a moderate stool burden identified throughout the colon. Vascular/Lymphatic: Aortic  atherosclerosis noted. No enlarged retroperitoneal or mesenteric adenopathy. No enlarged pelvic or inguinal lymph nodes. Reproductive: Previous hysterectomy.  No adnexal mass. Other: There is no ascites or focal fluid collections within the abdomen or pelvis. Musculoskeletal: Compression fractures are again identified involving T11, L3, and L4. The L3 and L4 fractures have been treated with bone cement. No new compression fracture since 05/17/2014. Degenerative disc disease noted at L5-S1. There is osteoarthritis involving the hip joints, right greater than left. IMPRESSION: 1. No acute findings identified within the abdomen or pelvis. 2. Chronic compression deformities involve the T11, L3 and L4 vertebra. 3.  Aortic atherosclerosis 4. Hiatal hernia 5. Chronic increase caliber of the bile ducts status post cholecystectomy. Electronically Signed   By: Kerby Moors M.D.   On: 10/01/2015 16:17    Follow up with PCP in 1 week.  Management plans discussed with the patient, family and they are in agreement.  CODE STATUS:  Code Status History    Date Active Date Inactive Code Status Order ID Comments User Context   10/01/2015  5:34 PM 10/02/2015  6:18 PM DNR [id number]  Loletha Grayer, MD ED    Questions for Most Recent Historical Code Status (Order [id number])    Question Answer Comment   In the event of cardiac or respiratory ARREST Do not call a "code blue"    In the event of cardiac or respiratory ARREST Do not perform Intubation, CPR, defibrillation or ACLS    In the event of cardiac or respiratory ARREST Use medication by any route, position, wound care, and other measures to relive pain and suffering. May use oxygen, suction and manual treatment of airway obstruction as needed for comfort.    Comments nurse may pronounce     Advance Directive Documentation        Most Recent Value   Type of Advance Directive  Healthcare Power of Attorney   Pre-existing out of facility DNR order (yellow form or pink  MOST form)     "MOST" Form in Place?        TOTAL TIME TAKING CARE OF THIS PATIENT ON DAY OF DISCHARGE: more than 30 minutes.   Hillary Bow R M.D on 10/03/2015 at 1:39 PM  Between 7am to 6pm - Pager - [phone]  After 6pm go to [email or web address] - password [email or web address] [facility]  Office  [phone]  CC: Primary care physician; Einar Pheasant, MD  Note: This dictation was prepared with Dragon dictation along with smaller phrase technology. Any transcriptional errors that result from this process are unintentional.

## 2015-10-04 DIAGNOSIS — M519 Unspecified thoracic, thoracolumbar and lumbosacral intervertebral disc disorder: Secondary | ICD-10-CM | POA: Diagnosis not present

## 2015-10-04 DIAGNOSIS — Z7982 Long term (current) use of aspirin: Secondary | ICD-10-CM | POA: Diagnosis not present

## 2015-10-04 DIAGNOSIS — Z951 Presence of aortocoronary bypass graft: Secondary | ICD-10-CM | POA: Diagnosis not present

## 2015-10-04 DIAGNOSIS — M1990 Unspecified osteoarthritis, unspecified site: Secondary | ICD-10-CM | POA: Diagnosis not present

## 2015-10-04 DIAGNOSIS — Z853 Personal history of malignant neoplasm of breast: Secondary | ICD-10-CM | POA: Diagnosis not present

## 2015-10-04 DIAGNOSIS — S2231XD Fracture of one rib, right side, subsequent encounter for fracture with routine healing: Secondary | ICD-10-CM | POA: Diagnosis not present

## 2015-10-04 DIAGNOSIS — I2510 Atherosclerotic heart disease of native coronary artery without angina pectoris: Secondary | ICD-10-CM | POA: Diagnosis not present

## 2015-10-04 DIAGNOSIS — I951 Orthostatic hypotension: Secondary | ICD-10-CM | POA: Diagnosis not present

## 2015-10-04 DIAGNOSIS — Z9181 History of falling: Secondary | ICD-10-CM | POA: Diagnosis not present

## 2015-10-05 ENCOUNTER — Telehealth: Payer: Self-pay

## 2015-10-05 ENCOUNTER — Other Ambulatory Visit: Payer: Self-pay | Admitting: Internal Medicine

## 2015-10-05 NOTE — Telephone Encounter (Signed)
Transition Care Management Follow-up Telephone Call   Date discharged? 10/02/15   How have you been since you were released from the hospital? Very sore.  I understand this will take 6-8 weeks to heal so I am trying to adjust.  Pain medication seems to be working ok.  No problems with eating/drinking.     Do you understand why you were in the hospital? YES, I had a fall.   Do you understand the discharge instructions? YES, to take it easy and follow up with ortho, cardio and PCP.     Where were you discharged to? HOME.   Items Reviewed:  Medications reviewed: YES, continuing these medications which have changed: hydrocodone-acetaminophen 5-325mg .  HOLDING FLORINEF 0.1 due to high BP while hospitalized.  Continuing all other home medications as directed.  Allergies reviewed: YES, Alendronate Sodium, Prednisone.  Dietary changes reviewed: YES, regular diet.  No problems.  Referrals reviewed: YES, appointments to be scheduled with Manson Passey, CARDIO and PCP.  HHPT in place.   Functional Questionnaire:   Activities of Daily Living (ADLs):   She states they are independent in the following: Toileting, self feeding, grooming. States they require assistance with the following: Ambulating (walker in use).  Dressing, bathing, meal prep.  Daughter assists.   Any transportation issues/concerns?: NO, daughter to assist.   Any patient concerns? YES, uncertain as to how long to hold FLORINEF.  Deferred to PCP for follow up.   Confirmed importance and date/time of follow-up visits scheduled YES, appointment to be scheduled.  Patient to call back and confirm availabiltiy.  Provider Appointment to be booked with Dr. Nicki Reaper (PCP).  Confirmed with patient if condition begins to worsen call PCP or go to the ER.  Patient was given the office number and encouraged to call back with question or concerns.  : YES, verbalized understanding.

## 2015-10-06 NOTE — Telephone Encounter (Signed)
See attached staff message for work in appt.

## 2015-10-07 ENCOUNTER — Emergency Department
Admission: EM | Admit: 2015-10-07 | Discharge: 2015-10-07 | Disposition: A | Discharge status: Home / Self Care | Payer: Medicare Other | Attending: Emergency Medicine | Admitting: Emergency Medicine

## 2015-10-07 ENCOUNTER — Ambulatory Visit: Payer: Medicare Other | Admitting: Internal Medicine

## 2015-10-07 ENCOUNTER — Encounter: Payer: Self-pay | Admitting: Emergency Medicine

## 2015-10-07 DIAGNOSIS — Y929 Unspecified place or not applicable: Secondary | ICD-10-CM | POA: Diagnosis not present

## 2015-10-07 DIAGNOSIS — I10 Essential (primary) hypertension: Secondary | ICD-10-CM | POA: Insufficient documentation

## 2015-10-07 DIAGNOSIS — E039 Hypothyroidism, unspecified: Secondary | ICD-10-CM | POA: Diagnosis not present

## 2015-10-07 DIAGNOSIS — X58XXXD Exposure to other specified factors, subsequent encounter: Secondary | ICD-10-CM | POA: Insufficient documentation

## 2015-10-07 DIAGNOSIS — M546 Pain in thoracic spine: Secondary | ICD-10-CM | POA: Diagnosis not present

## 2015-10-07 DIAGNOSIS — Z853 Personal history of malignant neoplasm of breast: Secondary | ICD-10-CM | POA: Insufficient documentation

## 2015-10-07 DIAGNOSIS — Y939 Activity, unspecified: Secondary | ICD-10-CM | POA: Insufficient documentation

## 2015-10-07 DIAGNOSIS — S2231XB Fracture of one rib, right side, initial encounter for open fracture: Secondary | ICD-10-CM | POA: Diagnosis not present

## 2015-10-07 DIAGNOSIS — S2231XD Fracture of one rib, right side, subsequent encounter for fracture with routine healing: Secondary | ICD-10-CM

## 2015-10-07 DIAGNOSIS — M1990 Unspecified osteoarthritis, unspecified site: Secondary | ICD-10-CM | POA: Insufficient documentation

## 2015-10-07 DIAGNOSIS — I2510 Atherosclerotic heart disease of native coronary artery without angina pectoris: Secondary | ICD-10-CM | POA: Diagnosis not present

## 2015-10-07 DIAGNOSIS — M549 Dorsalgia, unspecified: Secondary | ICD-10-CM | POA: Diagnosis not present

## 2015-10-07 DIAGNOSIS — Y999 Unspecified external cause status: Secondary | ICD-10-CM | POA: Insufficient documentation

## 2015-10-07 DIAGNOSIS — E785 Hyperlipidemia, unspecified: Secondary | ICD-10-CM | POA: Diagnosis not present

## 2015-10-07 DIAGNOSIS — G8929 Other chronic pain: Secondary | ICD-10-CM | POA: Diagnosis not present

## 2015-10-07 DIAGNOSIS — R1010 Upper abdominal pain, unspecified: Secondary | ICD-10-CM | POA: Diagnosis not present

## 2015-10-07 DIAGNOSIS — R0781 Pleurodynia: Secondary | ICD-10-CM | POA: Diagnosis present

## 2015-10-07 DIAGNOSIS — Z0289 Encounter for other administrative examinations: Secondary | ICD-10-CM

## 2015-10-07 MED ORDER — OXYCODONE-ACETAMINOPHEN 5-325 MG PO TABS
1.0000 | ORAL_TABLET | Freq: Once | ORAL | Status: AC
  Administered 2015-10-07: 1 via ORAL
  Filled 2015-10-07: qty 1

## 2015-10-07 NOTE — ED Notes (Signed)
States "my ribs are cracked and my back is hurting too".  Patient states she fell on Wednesday, one week ago.  Patient states seen through ED after fall and diagnosed with rib fractures.  Returns to the ED today for c/o bilateral rib pain and back pain.  States has been taking hydrocodone for pain, but no relief.

## 2015-10-07 NOTE — ED Notes (Signed)
PT reports last dose of hydrocodone was taken at 1230 today. Pain remains 10/10.

## 2015-10-07 NOTE — Discharge Instructions (Signed)

## 2015-10-07 NOTE — ED Provider Notes (Signed)
[facility] Emergency Department Provider Note  ____________________________________________  Time seen: Approximately 4:52 PM  I have reviewed the triage vital signs and the nursing notes.   HISTORY  Chief Complaint Back Pain    HPI Angela Howe is a 82 y.o. female who presents to the emergency department complaining of rib and back pain. Patient states that she had a fall and was seen in this department on 10/01/2015. Patient had right rib series revealing fracture. Patient has continued complaint of pain to this region despite taking prescribed hydrocodone. Patient routinely receives pain medication from Dr. Sharlet Salina. She denies any headache, neck pain, chest pain, shortness of breath, nausea or vomiting, abdominal pain. Patient reports bilateral rib pain and thoracic spine pain. Patient has a known history of multiple old compression fractures to the thoracic region and has chronic pain from same. Patient does have an increased pain level from baseline due to recent rib fracture.   Past Medical History  Diagnosis Date  . CAD (coronary artery disease)   . Orthostatic hypotension   . Syncope   . Breast cancer ([place])   . GERD (gastroesophageal reflux disease)   . Osteoarthritis   . Degenerative disc disease   . Hypothyroidism   . Hyperlipidemia     Previous intolerance of statins  . Peptic ulcer disease     Patient Active Problem List   Diagnosis Date Noted  . Rib fracture 10/03/2015  . Right carotid bruit 08/01/2015  . Loss of weight 01/12/2015  . Anemia 07/18/2014  . Health care maintenance 07/18/2014  . Hoarseness 07/18/2014  . Abdominal pain 05/05/2014  . Back pain 10/05/2013  . Knee pain 07/14/2013  . Oral pain 05/06/2013  . Itching 01/05/2013  . Breast cancer ([place]) 11/20/2012  . Syncope 11/20/2012  . Hypothyroidism 08/10/2012  . Difficulty sleeping 08/10/2012  . Dizziness 11/08/2010  . Hyperlipemia 03/23/2009  . HYPERTENSION,  BENIGN 03/23/2009  . CAD, NATIVE VESSEL 09/24/2008  . ORTHOSTATIC HYPOTENSION 09/24/2008  . ABNORMAL EKG 09/24/2008    Past Surgical History  Procedure Laterality Date  . Breast lumpectomy  2006  . Abdominal hysterectomy  1970  . Nasal sinus surgery    . Rotator cuff repair    . Appendectomy    . Coronary artery bypass graft  05/2006    x3  . Cholecystectomy    . Biopsy breast  2014  . Breast excisional biopsy Left 2014    neg  . Breast excisional biopsy Left 2006    postive radation    Current Outpatient Rx  Name  Route  Sig  Dispense  Refill  . acetaminophen (TYLENOL) 650 MG CR tablet   Oral   Take 650 mg by mouth every 8 (eight) hours as needed for pain.          Marland Kitchen amitriptyline (ELAVIL) 50 MG tablet   Oral   Take 50 mg by mouth at bedtime.         Marland Kitchen aspirin EC 81 MG tablet   Oral   Take 81 mg by mouth daily.         Marland Kitchen atorvastatin (LIPITOR) 20 MG tablet   Oral   Take 20 mg by mouth at bedtime.         . citalopram (CELEXA) 20 MG tablet      Take 1 tablet by mouth  daily   90 tablet   0   . fludrocortisone (FLORINEF) 0.1 MG tablet   Oral   Take  1 tablet (0.1 mg total) by mouth daily.   90 tablet   3   . HYDROcodone-acetaminophen (NORCO/VICODIN) 5-325 MG tablet   Oral   Take 0.5-1 tablets by mouth 3 (three) times daily as needed for moderate pain.   30 tablet   0   . lansoprazole (PREVACID) 30 MG capsule   Oral   Take 30 mg by mouth daily.           Marland Kitchen levothyroxine (SYNTHROID, LEVOTHROID) 50 MCG tablet      Take 1 tablet by mouth  daily   90 tablet   0   . nitroGLYCERIN (NITROSTAT) 0.4 MG SL tablet   Sublingual   Place 1 tablet (0.4 mg total) under the tongue every 5 (five) minutes as needed for chest pain.   25 tablet   0     Allergies Alendronate sodium and Prednisone  Family History  Problem Relation Age of Onset  . Heart attack Sister   . Pancreatic cancer Brother     Social History Social History  Substance Use  Topics  . Smoking status: Never Smoker   . Smokeless tobacco: Never Used  . Alcohol Use: No     Review of Systems  Constitutional: No fever/chills Cardiovascular: no chest pain. Respiratory: no cough. No SOB. Musculoskeletal: Positive for rib and back pain Skin: Negative for rash, abrasions, lacerations, ecchymosis. Neurological: Negative for headaches, focal weakness or numbness. 10-point ROS otherwise negative.  ____________________________________________   PHYSICAL EXAM:  VITAL SIGNS: ED Triage Vitals  Enc Vitals Group     BP 10/07/15 1604 165/36 mmHg     Pulse Rate 10/07/15 1604 89     Resp 10/07/15 1604 18     Temp 10/07/15 1604 98 F (36.7 C)     Temp Source 10/07/15 1604 Oral     SpO2 10/07/15 1604 97 %     Weight 10/07/15 1604 121 lb (54.885 kg)     Height 10/07/15 1604 5\' 2"  (1.575 m)     Head Cir --      Peak Flow --      Pain Score 10/07/15 1606 9     Pain Loc --      Pain Edu? --      Excl. in [place]? --      Constitutional: Alert and oriented. Well appearing and in no acute distress. Eyes: Conjunctivae are normal. PERRL. EOMI. Head: Atraumatic. Neck: No stridor.  No cervical spine tenderness to palpation.  Cardiovascular: Normal rate, regular rhythm. Normal S1 and S2.  Good peripheral circulation. Respiratory: Normal respiratory effort without tachypnea or retractions. Lungs CTAB. Good air entry to the bases with no decreased or absent breath sounds. Gastrointestinal: Bowel sounds 4 quadrants. Soft and nontender to palpation. No guarding or rigidity. No palpable masses. No distention. No CVA tenderness. Musculoskeletal: Full range of motion to all extremities. No gross deformities appreciated.No deformities or edema noted to rib cage or spine upon inspection. No flail segments or paradoxical chest wall movement. Patient is very tender to palpation over the lateral right lower rib cage. No palpable abnormalities to spine. Diffuse tenderness to palpation over  the thoracic spinal processes. No point tenderness. No palpable abnormality. Sensation and pulses intact bilateral lower extremities. Neurologic:  Normal speech and language. No gross focal neurologic deficits are appreciated.  Skin:  Skin is warm, dry and intact. No rash noted. Psychiatric: Mood and affect are normal. Speech and behavior are normal. Patient exhibits appropriate insight and judgement.  ____________________________________________   LABS (all labs ordered are listed, but only abnormal results are displayed)  Labs Reviewed - No data to display ____________________________________________  EKG   ____________________________________________  RADIOLOGY   Radiology results from 10/01/2015 were reviewed ____________________________________________    PROCEDURES  Procedure(s) performed:       Medications  oxyCODONE-acetaminophen (PERCOCET/ROXICET) 5-325 MG per tablet 1 tablet (1 tablet Oral Given 10/07/15 1720)     ____________________________________________   INITIAL IMPRESSION / ASSESSMENT AND PLAN / ED COURSE  Pertinent labs & imaging results that were available during my care of the patient were reviewed by me and considered in my medical decision making (see chart for details).  Patient's diagnosis is consistent with subsequent encounter for right rib fracture. Patient endorses continued pain to this region. She has been placed on a higher dose of hydrocodone by her orthopedic physician. Patient does take chronic pain medications. Patient endorses continued pain to rib cage. Patient is given 1 Percocet here in the emergency department and advised to follow-up with her orthopedic physician for change of medication, increase of dosing, or other pain management techniques.. No new medications are prescribed as patient does receive chronic pain medications. Patient is given ED precautions to return to the ED for any worsening or new  symptoms.     ____________________________________________  FINAL CLINICAL IMPRESSION(S) / ED DIAGNOSES  Final diagnoses:  Rib fracture, right, with routine healing, subsequent encounter  Chronic back pain      NEW MEDICATIONS STARTED DURING THIS VISIT:  New Prescriptions   No medications on file        This chart was dictated using voice recognition software/Dragon. Despite best efforts to proofread, errors can occur which can change the meaning. Any change was purely unintentional.    Darletta Moll, PA-C 10/07/15 1729  Hinda Kehr, MD 10/07/15 [phone]

## 2015-10-11 ENCOUNTER — Emergency Department: Payer: Medicare Other

## 2015-10-11 ENCOUNTER — Observation Stay
Admission: EM | Admit: 2015-10-11 | Discharge: 2015-10-13 | Disposition: A | Discharge status: Skilled Nursing Facility | Payer: Medicare Other | Attending: Internal Medicine | Admitting: Internal Medicine

## 2015-10-11 DIAGNOSIS — W19XXXA Unspecified fall, initial encounter: Secondary | ICD-10-CM | POA: Diagnosis not present

## 2015-10-11 DIAGNOSIS — R5383 Other fatigue: Secondary | ICD-10-CM | POA: Diagnosis not present

## 2015-10-11 DIAGNOSIS — M5136 Other intervertebral disc degeneration, lumbar region: Secondary | ICD-10-CM | POA: Diagnosis not present

## 2015-10-11 DIAGNOSIS — K219 Gastro-esophageal reflux disease without esophagitis: Secondary | ICD-10-CM | POA: Insufficient documentation

## 2015-10-11 DIAGNOSIS — R079 Chest pain, unspecified: Secondary | ICD-10-CM | POA: Diagnosis not present

## 2015-10-11 DIAGNOSIS — M546 Pain in thoracic spine: Secondary | ICD-10-CM | POA: Diagnosis not present

## 2015-10-11 DIAGNOSIS — J189 Pneumonia, unspecified organism: Secondary | ICD-10-CM | POA: Diagnosis not present

## 2015-10-11 DIAGNOSIS — S2241XA Multiple fractures of ribs, right side, initial encounter for closed fracture: Secondary | ICD-10-CM | POA: Insufficient documentation

## 2015-10-11 DIAGNOSIS — Z8711 Personal history of peptic ulcer disease: Secondary | ICD-10-CM | POA: Insufficient documentation

## 2015-10-11 DIAGNOSIS — R064 Hyperventilation: Secondary | ICD-10-CM | POA: Insufficient documentation

## 2015-10-11 DIAGNOSIS — Z888 Allergy status to other drugs, medicaments and biological substances status: Secondary | ICD-10-CM | POA: Insufficient documentation

## 2015-10-11 DIAGNOSIS — Z79899 Other long term (current) drug therapy: Secondary | ICD-10-CM | POA: Insufficient documentation

## 2015-10-11 DIAGNOSIS — M1990 Unspecified osteoarthritis, unspecified site: Secondary | ICD-10-CM | POA: Insufficient documentation

## 2015-10-11 DIAGNOSIS — E876 Hypokalemia: Secondary | ICD-10-CM | POA: Insufficient documentation

## 2015-10-11 DIAGNOSIS — Y939 Activity, unspecified: Secondary | ICD-10-CM | POA: Insufficient documentation

## 2015-10-11 DIAGNOSIS — M4854XA Collapsed vertebra, not elsewhere classified, thoracic region, initial encounter for fracture: Secondary | ICD-10-CM | POA: Insufficient documentation

## 2015-10-11 DIAGNOSIS — R0789 Other chest pain: Secondary | ICD-10-CM | POA: Diagnosis not present

## 2015-10-11 DIAGNOSIS — D649 Anemia, unspecified: Secondary | ICD-10-CM | POA: Diagnosis not present

## 2015-10-11 DIAGNOSIS — K449 Diaphragmatic hernia without obstruction or gangrene: Secondary | ICD-10-CM | POA: Insufficient documentation

## 2015-10-11 DIAGNOSIS — R2689 Other abnormalities of gait and mobility: Secondary | ICD-10-CM | POA: Diagnosis not present

## 2015-10-11 DIAGNOSIS — E785 Hyperlipidemia, unspecified: Secondary | ICD-10-CM | POA: Diagnosis present

## 2015-10-11 DIAGNOSIS — R262 Difficulty in walking, not elsewhere classified: Secondary | ICD-10-CM | POA: Diagnosis not present

## 2015-10-11 DIAGNOSIS — S22000A Wedge compression fracture of unspecified thoracic vertebra, initial encounter for closed fracture: Secondary | ICD-10-CM | POA: Diagnosis not present

## 2015-10-11 DIAGNOSIS — Z8781 Personal history of (healed) traumatic fracture: Secondary | ICD-10-CM | POA: Diagnosis not present

## 2015-10-11 DIAGNOSIS — E039 Hypothyroidism, unspecified: Secondary | ICD-10-CM | POA: Diagnosis present

## 2015-10-11 DIAGNOSIS — Z7982 Long term (current) use of aspirin: Secondary | ICD-10-CM | POA: Diagnosis not present

## 2015-10-11 DIAGNOSIS — Z853 Personal history of malignant neoplasm of breast: Secondary | ICD-10-CM | POA: Insufficient documentation

## 2015-10-11 DIAGNOSIS — Z951 Presence of aortocoronary bypass graft: Secondary | ICD-10-CM | POA: Diagnosis not present

## 2015-10-11 DIAGNOSIS — G8929 Other chronic pain: Secondary | ICD-10-CM | POA: Diagnosis not present

## 2015-10-11 DIAGNOSIS — R531 Weakness: Secondary | ICD-10-CM | POA: Diagnosis not present

## 2015-10-11 DIAGNOSIS — I2510 Atherosclerotic heart disease of native coronary artery without angina pectoris: Secondary | ICD-10-CM | POA: Diagnosis not present

## 2015-10-11 DIAGNOSIS — I10 Essential (primary) hypertension: Secondary | ICD-10-CM | POA: Diagnosis not present

## 2015-10-11 DIAGNOSIS — M549 Dorsalgia, unspecified: Secondary | ICD-10-CM | POA: Diagnosis present

## 2015-10-11 DIAGNOSIS — M4806 Spinal stenosis, lumbar region: Secondary | ICD-10-CM | POA: Diagnosis not present

## 2015-10-11 NOTE — ED Notes (Signed)
Pt arrived via ems - Pt called ems for report of chest pain mid-sternal - denies shortness of breath and syncope

## 2015-10-11 NOTE — ED Notes (Addendum)
Pt reports she fell last Wed at home and fx 2 ribs on the right side - Lungs sounds are clear X4 - Pt picked up by EMS for mid-sternal chest pain that started about 30 minutes ago - Pt also c/o back pain (she is wearing a fentanyl patch on her back so she refused pain medication en route to the ER - Pt was given 243mg  of ASA because she had already taken 81mg  today - Pt had cardiac bypass surgery in 2007 - Pt had taken 2 nitro prior to EMS arrival which relieved her chest pain from a 10 to a 7 - EKG en route to the hospital was normal sinus per EMS - Per the family report to the EMS this pt seems to be having episodes of anxiety recently

## 2015-10-12 ENCOUNTER — Emergency Department: Payer: Medicare Other

## 2015-10-12 DIAGNOSIS — J189 Pneumonia, unspecified organism: Secondary | ICD-10-CM | POA: Diagnosis not present

## 2015-10-12 DIAGNOSIS — R079 Chest pain, unspecified: Secondary | ICD-10-CM | POA: Diagnosis present

## 2015-10-12 DIAGNOSIS — S2239XA Fracture of one rib, unspecified side, initial encounter for closed fracture: Secondary | ICD-10-CM | POA: Diagnosis not present

## 2015-10-12 DIAGNOSIS — R531 Weakness: Secondary | ICD-10-CM | POA: Diagnosis not present

## 2015-10-12 LAB — CBC WITH DIFFERENTIAL/PLATELET
Basophils Absolute: 0 10*3/uL (ref 0–0.1)
Basophils Relative: 0 %
EOS ABS: 0.1 10*3/uL (ref 0–0.7)
EOS PCT: 1 %
HCT: 30.6 % — ABNORMAL LOW (ref 35.0–47.0)
Hemoglobin: 10.7 g/dL — ABNORMAL LOW (ref 12.0–16.0)
LYMPHS ABS: 2.3 10*3/uL (ref 1.0–3.6)
Lymphocytes Relative: 25 %
MCH: 30.4 pg (ref 26.0–34.0)
MCHC: 34.8 g/dL (ref 32.0–36.0)
MCV: 87.3 fL (ref 80.0–100.0)
MONO ABS: 0.7 10*3/uL (ref 0.2–0.9)
MONOS PCT: 8 %
Neutro Abs: 5.9 10*3/uL (ref 1.4–6.5)
Neutrophils Relative %: 66 %
PLATELETS: 302 10*3/uL (ref 150–440)
RBC: 3.51 MIL/uL — ABNORMAL LOW (ref 3.80–5.20)
RDW: 14 % (ref 11.5–14.5)
WBC: 9.1 10*3/uL (ref 3.6–11.0)

## 2015-10-12 LAB — TROPONIN I
Troponin I: <0.03 ng/mL (ref <0.031)
Troponin I: <0.03 ng/mL (ref <0.031)
Troponin I: <0.03 ng/mL (ref <0.031)

## 2015-10-12 LAB — COMPREHENSIVE METABOLIC PANEL
ALK PHOS: 125 U/L (ref 38–126)
ALT: 20 U/L (ref 14–54)
ANION GAP: 9 (ref 5–15)
AST: 25 U/L (ref 15–41)
Albumin: 3.3 g/dL — ABNORMAL LOW (ref 3.5–5.0)
BUN: 30 mg/dL — ABNORMAL HIGH (ref 6–20)
CALCIUM: 8.6 mg/dL — AB (ref 8.9–10.3)
CHLORIDE: 104 mmol/L (ref 101–111)
CO2: 26 mmol/L (ref 22–32)
Creatinine, Ser: 1.12 mg/dL — ABNORMAL HIGH (ref 0.44–1.00)
GFR calc non Af Amer: 43 mL/min — ABNORMAL LOW (ref >60)
GFR, EST AFRICAN AMERICAN: 50 mL/min — AB (ref >60)
Glucose, Bld: 111 mg/dL — ABNORMAL HIGH (ref 65–99)
POTASSIUM: 3.3 mmol/L — AB (ref 3.5–5.1)
SODIUM: 139 mmol/L (ref 135–145)
Total Bilirubin: 0.4 mg/dL (ref 0.3–1.2)
Total Protein: 6.2 g/dL — ABNORMAL LOW (ref 6.5–8.1)

## 2015-10-12 MED ORDER — IOPAMIDOL (ISOVUE-370) INJECTION 76%
75.0000 mL | Freq: Once | INTRAVENOUS | Status: AC | PRN
  Administered 2015-10-12: 75 mL via INTRAVENOUS

## 2015-10-12 MED ORDER — OXYCODONE-ACETAMINOPHEN 5-325 MG PO TABS
1.0000 | ORAL_TABLET | Freq: Four times a day (QID) | ORAL | Status: DC | PRN
  Administered 2015-10-12: 1 via ORAL
  Filled 2015-10-12 (×2): qty 1

## 2015-10-12 MED ORDER — DEXTROSE 5 % IV SOLN
500.0000 mg | Freq: Once | INTRAVENOUS | Status: AC
  Administered 2015-10-12: 500 mg via INTRAVENOUS
  Filled 2015-10-12: qty 500

## 2015-10-12 MED ORDER — POTASSIUM CHLORIDE CRYS ER 20 MEQ PO TBCR
40.0000 meq | EXTENDED_RELEASE_TABLET | Freq: Once | ORAL | Status: AC
  Administered 2015-10-12: 40 meq via ORAL
  Filled 2015-10-12: qty 2

## 2015-10-12 MED ORDER — ASPIRIN EC 81 MG PO TBEC
81.0000 mg | DELAYED_RELEASE_TABLET | Freq: Every day | ORAL | Status: DC
  Administered 2015-10-12 – 2015-10-13 (×2): 81 mg via ORAL
  Filled 2015-10-12 (×2): qty 1

## 2015-10-12 MED ORDER — ATORVASTATIN CALCIUM 20 MG PO TABS
20.0000 mg | ORAL_TABLET | Freq: Every day | ORAL | Status: DC
  Administered 2015-10-12: 20 mg via ORAL
  Filled 2015-10-12: qty 1

## 2015-10-12 MED ORDER — MORPHINE SULFATE (PF) 2 MG/ML IV SOLN
2.0000 mg | Freq: Once | INTRAVENOUS | Status: AC
  Administered 2015-10-12: 2 mg via INTRAVENOUS
  Filled 2015-10-12: qty 1

## 2015-10-12 MED ORDER — PANTOPRAZOLE SODIUM 20 MG PO TBEC: 20.0000 mg | DELAYED_RELEASE_TABLET | Freq: Every day | ORAL | Status: DC

## 2015-10-12 MED ORDER — AMITRIPTYLINE HCL 50 MG PO TABS
50.0000 mg | ORAL_TABLET | Freq: Every day | ORAL | Status: DC
  Administered 2015-10-12: 50 mg via ORAL
  Filled 2015-10-12: qty 1

## 2015-10-12 MED ORDER — ACETAMINOPHEN ER 650 MG PO TBCR: 650.0000 mg | EXTENDED_RELEASE_TABLET | Freq: Three times a day (TID) | ORAL | Status: DC | PRN

## 2015-10-12 MED ORDER — DEXTROSE 5 % IV SOLN
1.0000 g | INTRAVENOUS | Status: DC
  Administered 2015-10-12 – 2015-10-13 (×2): 1 g via INTRAVENOUS
  Filled 2015-10-12 (×3): qty 10

## 2015-10-12 MED ORDER — ACETAMINOPHEN 325 MG PO TABS: 650.0000 mg | ORAL_TABLET | Freq: Three times a day (TID) | ORAL | Status: DC | PRN

## 2015-10-12 MED ORDER — ENOXAPARIN SODIUM 30 MG/0.3ML ~~LOC~~ SOLN
30.0000 mg | Freq: Once | SUBCUTANEOUS | Status: AC
  Administered 2015-10-12: 30 mg via SUBCUTANEOUS
  Filled 2015-10-12: qty 0.3

## 2015-10-12 MED ORDER — FLUDROCORTISONE ACETATE 0.1 MG PO TABS
0.1000 mg | ORAL_TABLET | Freq: Every day | ORAL | Status: DC
  Administered 2015-10-12 – 2015-10-13 (×2): 0.1 mg via ORAL
  Filled 2015-10-12 (×2): qty 1

## 2015-10-12 MED ORDER — CITALOPRAM HYDROBROMIDE 20 MG PO TABS
20.0000 mg | ORAL_TABLET | Freq: Every day | ORAL | Status: DC
  Administered 2015-10-12 – 2015-10-13 (×2): 20 mg via ORAL
  Filled 2015-10-12 (×2): qty 1

## 2015-10-12 MED ORDER — ONDANSETRON HCL 4 MG/2ML IJ SOLN
4.0000 mg | Freq: Once | INTRAMUSCULAR | Status: AC
Start: 2015-10-12 — End: 2015-10-12
  Administered 2015-10-12: 4 mg via INTRAVENOUS
  Filled 2015-10-12: qty 2

## 2015-10-12 MED ORDER — NITROGLYCERIN 0.4 MG SL SUBL: 0.4000 mg | SUBLINGUAL_TABLET | SUBLINGUAL | Status: DC | PRN

## 2015-10-12 MED ORDER — LEVOTHYROXINE SODIUM 50 MCG PO TABS
50.0000 ug | ORAL_TABLET | Freq: Every day | ORAL | Status: DC
  Administered 2015-10-12 – 2015-10-13 (×2): 50 ug via ORAL
  Filled 2015-10-12 (×2): qty 1

## 2015-10-12 MED ORDER — SODIUM CHLORIDE 0.9 % IV BOLUS (SEPSIS)
1000.0000 mL | Freq: Once | INTRAVENOUS | Status: AC
  Administered 2015-10-12: 1000 mL via INTRAVENOUS

## 2015-10-12 MED ORDER — AZITHROMYCIN 250 MG PO TABS
500.0000 mg | ORAL_TABLET | Freq: Every day | ORAL | Status: DC
  Administered 2015-10-12 – 2015-10-13 (×2): 500 mg via ORAL
  Filled 2015-10-12 (×2): qty 2

## 2015-10-12 MED ORDER — FENTANYL 12 MCG/HR TD PT72: 12.5000 ug | MEDICATED_PATCH | TRANSDERMAL | Status: DC

## 2015-10-12 MED ORDER — PANTOPRAZOLE SODIUM 40 MG PO TBEC
40.0000 mg | DELAYED_RELEASE_TABLET | Freq: Every day | ORAL | Status: DC
  Administered 2015-10-12 – 2015-10-13 (×2): 40 mg via ORAL
  Filled 2015-10-12 (×2): qty 1

## 2015-10-12 NOTE — H&P (Signed)
PCP:   Einar Pheasant, MD   Chief Complaint:  Chest pain  HPI: This is a 82 year old female who lives at home. Tonight she developed left-sided chest pains . Pain was sharp and severe. Currently ranked as 5/10. She denies any radiation of the pain. Michela Pitcher it does not feel that GERD. The patient states does not occur after activity. She describes the pain as sharp, severe and intermittent. She denies any palpitation. She has a history of coronary artery disease, with a CABG in 2009. She has been doing fine since with no stress test. Of notes the patient is a fall risk and fell last Wednesday sustaining 2 right-sided rib fractures. She also has a history of thoracic spinal compression fractures.. She was started on fentanyl patch yesterday by PCP for her back pain, which she ranks today as 10/10. Today she developed a severe left-sided. chest pains. She does have a nonproductive cough. She denies any fever, chills, nausea, vomiting or abdominal pain.   Review of Systems:  The patient denies anorexia, fever, weight loss,, vision loss, decreased hearing, hoarseness, chest pain, syncope, dyspnea on exertion, peripheral edema, balance deficits, hemoptysis, abdominal pain, melena, hematochezia, severe indigestion/heartburn, hematuria, incontinence, genital sores, muscle weakness, suspicious skin lesions, transient blindness, difficulty walking, depression, unusual weight change, abnormal bleeding, enlarged lymph nodes, angioedema, and breast masses.  Past Medical History: Past Medical History  Diagnosis Date  . CAD (coronary artery disease)   . Orthostatic hypotension   . Syncope   . Breast cancer ([place])   . GERD (gastroesophageal reflux disease)   . Osteoarthritis   . Degenerative disc disease   . Hypothyroidism   . Hyperlipidemia     Previous intolerance of statins  . Peptic ulcer disease    Past Surgical History  Procedure Laterality Date  . Breast lumpectomy  2006  . Abdominal hysterectomy   1970  . Nasal sinus surgery    . Rotator cuff repair    . Appendectomy    . Coronary artery bypass graft  05/2006    x3  . Cholecystectomy    . Biopsy breast  2014  . Breast excisional biopsy Left 2014    neg  . Breast excisional biopsy Left 2006    postive radation    Medications: Prior to Admission medications   Medication Sig Start Date End Date Taking? Authorizing Provider  acetaminophen (TYLENOL) 650 MG CR tablet Take 650 mg by mouth every 8 (eight) hours as needed for pain.    Yes Historical Provider, MD  amitriptyline (ELAVIL) 50 MG tablet Take 50 mg by mouth at bedtime.   Yes Historical Provider, MD  aspirin EC 81 MG tablet Take 81 mg by mouth daily.   Yes Historical Provider, MD  atorvastatin (LIPITOR) 20 MG tablet Take 20 mg by mouth at bedtime.   Yes Historical Provider, MD  citalopram (CELEXA) 20 MG tablet Take 1 tablet by mouth  daily 10/05/15  Yes Einar Pheasant, MD  fentaNYL (DURAGESIC - DOSED MCG/HR) 12 MCG/HR Place 1 patch onto the skin every 3 (three) days. 10/11/15  Yes Historical Provider, MD  fludrocortisone (FLORINEF) 0.1 MG tablet Take 1 tablet (0.1 mg total) by mouth daily. 03/22/15  Yes Einar Pheasant, MD  HYDROcodone-acetaminophen (NORCO/VICODIN) 5-325 MG tablet Take 0.5-1 tablets by mouth 3 (three) times daily as needed for moderate pain. 10/02/15  Yes Srikar Sudini, MD  lansoprazole (PREVACID) 30 MG capsule Take 30 mg by mouth daily.     Yes Historical Provider, MD  levothyroxine (  SYNTHROID, LEVOTHROID) 50 MCG tablet Take 1 tablet by mouth  daily 10/05/15  Yes Einar Pheasant, MD  nitroGLYCERIN (NITROSTAT) 0.4 MG SL tablet Place 1 tablet (0.4 mg total) under the tongue every 5 (five) minutes as needed for chest pain. 07/10/14  Yes Einar Pheasant, MD  oxyCODONE-acetaminophen (PERCOCET/ROXICET) 5-325 MG tablet Take 1 tablet by mouth every 6 (six) hours as needed. 10/09/15  Yes Historical Provider, MD    Allergies:   Allergies  Allergen Reactions  . Alendronate  Sodium Other (See Comments)    Reaction:  Restlessness   . Prednisone Other (See Comments)    Reaction:  Restlessness     Social History:  reports that she has never smoked. She has never used smokeless tobacco. She reports that she does not drink alcohol or use illicit drugs.  Family History: Family History  Problem Relation Age of Onset  . Heart attack Sister   . Pancreatic cancer Brother     Physical Exam: Filed Vitals:   10/11/15 2336 10/11/15 2344 10/12/15 0200  BP:  134/80 185/88  Pulse: 90 85 95  Temp: 98.5 F (36.9 C)    TempSrc: Oral    Resp:  23 19  Height: 5\' 4"  (1.626 m)    Weight: 54.885 kg (121 lb)    SpO2:  100% 97%    General:  Alert and oriented times three, well developed and nourished, no acute distress Eyes: PERRLA, pink conjunctiva, no scleral icterus ENT: Moist oral mucosa, neck supple, no thyromegaly Lungs: clear to ascultation, no wheeze, no crackles, no use of accessory muscles Cardiovascular: regular rate and rhythm, no regurgitation, no gallops, no murmurs. No carotid bruits, no JVD, positive reproducible TTT left sided chest wall Abdomen: soft, positive BS, non-tender, non-distended, no organomegaly, not an acute abdomen GU: not examined Neuro: CN II - XII grossly intact, sensation intact Musculoskeletal: strength 5/5 all extremities, no clubbing, cyanosis or edema Skin: no rash, no subcutaneous crepitation, no decubitus Psych: appropriate patient   Labs on Admission:   Recent Labs  10/11/15 2333  NA 139  K 3.3*  CL 104  CO2 26  GLUCOSE 111*  BUN 30*  CREATININE 1.12*  CALCIUM 8.6*    Recent Labs  10/11/15 2333  AST 25  ALT 20  ALKPHOS 125  BILITOT 0.4  PROT 6.2*  ALBUMIN 3.3*   No results for input(s): LIPASE, AMYLASE in the last 72 hours.  Recent Labs  10/11/15 2333  WBC 9.1  NEUTROABS 5.9  HGB 10.7*  HCT 30.6*  MCV 87.3  PLT 302    Recent Labs  10/11/15 2333  TROPONINI <0.03   Invalid input(s):  POCBNP No results for input(s): DDIMER in the last 72 hours. No results for input(s): HGBA1C in the last 72 hours. No results for input(s): CHOL, HDL, LDLCALC, TRIG, CHOLHDL, LDLDIRECT in the last 72 hours. No results for input(s): TSH, T4TOTAL, T3FREE, THYROIDAB in the last 72 hours.  Invalid input(s): FREET3 No results for input(s): VITAMINB12, FOLATE, FERRITIN, TIBC, IRON, RETICCTPCT in the last 72 hours.  Micro Results: No results found for this or any previous visit (from the past 240 hour(s)).   Radiological Exams on Admission: Dg Chest 2 View  10/12/2015  CLINICAL DATA:  Patient fell at home last Wednesday with 2 right rib fractures. Now patient is having midsternal chest pain and back pain. EXAM: CHEST  2 VIEW COMPARISON:  10/01/2015 FINDINGS: Postoperative changes in the mediastinum. Normal heart size and pulmonary vascularity. Linear fibrosis in  the left lung base. No focal airspace disease or consolidation. No pneumothorax. No blunting of costophrenic angles. Mediastinal contours appear intact. Postoperative and degenerative changes in the right shoulder. Degenerative changes in the spine and left shoulder. Multiple vertebral compression deformities appear similar to previous study and suggests osteoporosis. IMPRESSION: No evidence of active pulmonary disease. Multiple thoracic compression fractures appear unchanged since previous study and are likely due to osteoporosis. Electronically Signed   By: Lucienne Capers M.D.   On: 10/12/2015 01:03   Ct Angio Chest Pe W/cm &/or Wo Cm  10/12/2015  CLINICAL DATA:  Patient fell last Wednesday with 2 right rib fractures. Now with midsternal chest pain. Back pain. EXAM: CT ANGIOGRAPHY CHEST WITH CONTRAST TECHNIQUE: Multidetector CT imaging of the chest was performed using the standard protocol during bolus administration of intravenous contrast. Multiplanar CT image reconstructions and MIPs were obtained to evaluate the vascular anatomy. CONTRAST:   100 mL Isovue 370 COMPARISON:  None. FINDINGS: Technically adequate study with good opacification of the central and segmental pulmonary arteries. No focal filling defects. No evidence of significant pulmonary embolus. Normal heart size. Normal caliber thoracic aorta. No aortic dissection. Great vessel origins are patent. Postoperative changes in the mediastinum with sternotomy and coronary bypass grafts. Small esophageal hiatal hernia. No significant lymphadenopathy in the chest. Esophagus is decompressed. Ground-glass changes in both lower lungs may indicate edema or pneumonia. No pleural effusions. No pneumothorax. Airways are patent. Multiple compression fractures of the thoracic spine likely indicating osteoporosis. These are not change since previous chest x-ray from 10/01/2015. Included portions of the upper abdominal organs are grossly unremarkable. Surgical absence of the gallbladder. Nodule in the left breast with calcification. Appearance is suggestive of benign lesion. Review of the MIP images confirms the above findings. IMPRESSION: No evidence of significant pulmonary embolus. Infiltration or edema in the lung bases. Small esophageal hiatal hernia. Electronically Signed   By: Lucienne Capers M.D.   On: 10/12/2015 02:28    Assessment/Plan Present on Admission:  . Chest pain, doubt cardiac etiology -Bring in for 23 hour observation. The patient's chest pain like most likely due to her fractured ribs and generalized musculoskeletal pains but will monitor on telemetry and cycle cardiac enzymes  . Community acquired pneumonia -This could also be contributing to patient's chest discomfort. We'll start treatment with IV antibiotics Rocephin and azithromycin. -Oxygen and duonebs ordered as needed  Mild hypokalemia -Replete orally  Right-sided rib fractures -Aware, monitor. Pain medication to keep patient comfortable  .h/o  Breast cancer ([place]) -Stable, aware  . chronic Back  pain -Continue fentanyl patch and oral medications.  . Hyperlipemia -Stable, home medications resumed  . Hypothyroidism -Stable, home medications resumed  . HYPERTENSION, BENIGN -Stable, resume home medications   Angela Howe 10/12/2015, 3:44 AM

## 2015-10-12 NOTE — Care Management Obs Status (Signed)
[facility] NOTIFICATION   Patient Details  Name: Angela Howe MRN: [id number] Date of Birth: 04-17-1926   Medicare Observation Status Notification Given:  Yes    Katrina Stack, RN 10/12/2015, 10:18 AM

## 2015-10-12 NOTE — Progress Notes (Signed)
Patient ID: Angela Howe, female   DOB: August 16, 1925, 82 y.o.   MRN: [id number] [facility] PROGRESS NOTE  Angela Howe [id number] DOB: Oct 27, 1925 DOA: 10/11/2015 PCP: Angela Pheasant, MD  HPI/Subjective:  patient thought she was having a heart attack yesterday. With chest pain. Now no chest pain and just pain in her right back. She was just diagnosed with rib fractures.  Objective: Filed Vitals:   10/12/15 0755 10/12/15 1117  BP: 156/98 145/59  Pulse: 93 89  Temp:  97.6 F (36.4 C)  Resp: 18 18    Filed Weights   10/11/15 2336 10/12/15 0548  Weight: 54.885 kg (121 lb) 52.98 kg (116 lb 12.8 oz)    ROS: Review of Systems  Constitutional: Negative for fever and chills.  Eyes: Negative for blurred vision.  Respiratory: Negative for cough and shortness of breath.   Cardiovascular: Positive for chest pain.  Gastrointestinal: Negative for nausea, vomiting, abdominal pain, diarrhea and constipation.  Genitourinary: Negative for dysuria.  Musculoskeletal: Negative for joint pain.  Neurological: Negative for dizziness and headaches.   Exam: Physical Exam  Constitutional: She is oriented to person, place, and time.  HENT:  Nose: No mucosal edema.  Mouth/Throat: No oropharyngeal exudate or posterior oropharyngeal edema.  Eyes: Conjunctivae, EOM and lids are normal. Pupils are equal, round, and reactive to light.  Neck: No JVD present. Carotid bruit is not present. No edema present. No thyroid mass and no thyromegaly present.  Cardiovascular: S1 normal and S2 normal.  Exam reveals no gallop.   No murmur heard. Pulses:      Dorsalis pedis pulses are 2+ on the right side, and 2+ on the left side.  Respiratory: No respiratory distress. She has no wheezes. She has no rhonchi. She has no rales.  GI: Soft. Bowel sounds are normal. There is no tenderness.  Musculoskeletal:       Right ankle: She exhibits no swelling.       Left ankle: She exhibits no swelling.   Lymphadenopathy:    She has no cervical adenopathy.  Neurological: She is alert and oriented to person, place, and time. No cranial nerve deficit.  Skin: Skin is warm. No rash noted. Nails show no clubbing.  Psychiatric: She has a normal mood and affect.      Data Reviewed: Basic Metabolic Panel:  Recent Labs Lab 10/11/15 2333  NA 139  K 3.3*  CL 104  CO2 26  GLUCOSE 111*  BUN 30*  CREATININE 1.12*  CALCIUM 8.6*   Liver Function Tests:  Recent Labs Lab 10/11/15 2333  AST 25  ALT 20  ALKPHOS 125  BILITOT 0.4  PROT 6.2*  ALBUMIN 3.3*   CBC:  Recent Labs Lab 10/11/15 2333  WBC 9.1  NEUTROABS 5.9  HGB 10.7*  HCT 30.6*  MCV 87.3  PLT 302   Cardiac Enzymes:  Recent Labs Lab 10/11/15 2333 10/12/15 0550 10/12/15 1231  TROPONINI <0.03 <0.03 <0.03     Recent Results (from the past 240 hour(s))  Culture, blood (routine x 2)     Status: None (Preliminary result)   Collection Time: 10/12/15  3:16 AM  Result Value Ref Range Status   Specimen Description BLOOD LEFT ASSIST CONTROL  Final   Special Requests BOTTLES DRAWN AEROBIC AND ANAEROBIC  3CC  Final   Culture NO GROWTH < 12 HOURS  Final   Report Status PENDING  Incomplete  Culture, blood (routine x 2)     Status: None (Preliminary result)  Collection Time: 10/12/15  3:16 AM  Result Value Ref Range Status   Specimen Description BLOOD RIGHT FOREARM  Final   Special Requests   Final    BOTTLES DRAWN AEROBIC AND ANAEROBIC  AEROIC3CC ANAEROBIC 5CC   Culture NO GROWTH < 12 HOURS  Final   Report Status PENDING  Incomplete     Studies: Dg Chest 2 View  10/12/2015  CLINICAL DATA:  Patient fell at home last Wednesday with 2 right rib fractures. Now patient is having midsternal chest pain and back pain. EXAM: CHEST  2 VIEW COMPARISON:  10/01/2015 FINDINGS: Postoperative changes in the mediastinum. Normal heart size and pulmonary vascularity. Linear fibrosis in the left lung base. No focal airspace disease or  consolidation. No pneumothorax. No blunting of costophrenic angles. Mediastinal contours appear intact. Postoperative and degenerative changes in the right shoulder. Degenerative changes in the spine and left shoulder. Multiple vertebral compression deformities appear similar to previous study and suggests osteoporosis. IMPRESSION: No evidence of active pulmonary disease. Multiple thoracic compression fractures appear unchanged since previous study and are likely due to osteoporosis. Electronically Signed   By: Lucienne Capers M.D.   On: 10/12/2015 01:03   Ct Angio Chest Pe W/cm &/or Wo Cm  10/12/2015  CLINICAL DATA:  Patient fell last Wednesday with 2 right rib fractures. Now with midsternal chest pain. Back pain. EXAM: CT ANGIOGRAPHY CHEST WITH CONTRAST TECHNIQUE: Multidetector CT imaging of the chest was performed using the standard protocol during bolus administration of intravenous contrast. Multiplanar CT image reconstructions and MIPs were obtained to evaluate the vascular anatomy. CONTRAST:  100 mL Isovue 370 COMPARISON:  None. FINDINGS: Technically adequate study with good opacification of the central and segmental pulmonary arteries. No focal filling defects. No evidence of significant pulmonary embolus. Normal heart size. Normal caliber thoracic aorta. No aortic dissection. Great vessel origins are patent. Postoperative changes in the mediastinum with sternotomy and coronary bypass grafts. Small esophageal hiatal hernia. No significant lymphadenopathy in the chest. Esophagus is decompressed. Ground-glass changes in both lower lungs may indicate edema or pneumonia. No pleural effusions. No pneumothorax. Airways are patent. Multiple compression fractures of the thoracic spine likely indicating osteoporosis. These are not change since previous chest x-ray from 10/01/2015. Included portions of the upper abdominal organs are grossly unremarkable. Surgical absence of the gallbladder. Nodule in the left  breast with calcification. Appearance is suggestive of benign lesion. Review of the MIP images confirms the above findings. IMPRESSION: No evidence of significant pulmonary embolus. Infiltration or edema in the lung bases. Small esophageal hiatal hernia. Electronically Signed   By: Lucienne Capers M.D.   On: 10/12/2015 02:28    Scheduled Meds: . amitriptyline  50 mg Oral QHS  . aspirin EC  81 mg Oral Daily  . atorvastatin  20 mg Oral QHS  . azithromycin  500 mg Oral Daily  . cefTRIAXone (ROCEPHIN)  IV  1 g Intravenous Q24H  . citalopram  20 mg Oral Daily  . enoxaparin (LOVENOX) injection  30 mg Subcutaneous Once  . fentaNYL  12.5 mcg Transdermal Q72H  . fludrocortisone  0.1 mg Oral Daily  . levothyroxine  50 mcg Oral QAC breakfast  . pantoprazole  40 mg Oral Daily  . potassium chloride  40 mEq Oral Once    Assessment/Plan:  1. Pneumonia. No fever. Normal white count. Patient with recent rib fractures. Continue antibiotics. Likely disposition tomorrow.  2.  chest pain. Cardiac enzymes are negative. Could be related to pneumonia and broken ribs.  3.  Weakness. Physical therapy recommended rehabilitation. 4.  Hypokalemia. Replace potassium orally 5.  Hypothyroidism unspecified on levothyroxine 6.  GERD on Protonix 7.  Hyperlipidemia unspecified on atorvastatin  Code Status:  Code Status History    Date Active Date Inactive Code Status Order ID Comments User Context   10/01/2015  5:34 PM 10/02/2015  6:18 PM DNR [id number]  Loletha Grayer, MD ED    Questions for Most Recent Historical Code Status (Order [id number])    Question Answer Comment   In the event of cardiac or respiratory ARREST Do not call a "code blue"    In the event of cardiac or respiratory ARREST Do not perform Intubation, CPR, defibrillation or ACLS    In the event of cardiac or respiratory ARREST Use medication by any route, position, wound care, and other measures to relive pain and suffering. May use oxygen, suction and  manual treatment of airway obstruction as needed for comfort.    Comments nurse may pronounce      Disposition Plan:  Potentially to rehabilitation tomorrow   Antibiotics:   Rocephin   Zithromax  Time spent:  35 minutes  [place], [place]

## 2015-10-12 NOTE — Care Management (Signed)
Patient with recent observation admission and discharge from [facility] 6/3 after stay for syncope.  She was living alone but after this observation stay- went to her daughter Clearence Ped residence.  [facility].  It sounds as though agency contacted patient but on the day of contact she die not feel like receiving therapy.  She was seen in the ED 6/8 after falling at home and now  has rib fractures. Placed in observation 6/12 for chest pain.  Physical therapy has now recommended skilled nursing placement and patient and her daughter Yolanda Bonine are in agreement.  No facility placement.  No facility preference.  Gave permission to initiate bed search. Provided with list of local skilled nursing facilities. Confirmed that payor is [place].

## 2015-10-12 NOTE — ED Provider Notes (Signed)
[facility] Emergency Department Provider Note   ____________________________________________  Time seen: Approximately 12:31 AM  I have reviewed the triage vital signs and the nursing notes.   HISTORY  Chief Complaint Chest Pain    HPI Angela Howe is a 82 y.o. female who presents to the ED from home via EMS with a chief complaint of chest pain. Patient has a history of CAD s/p CABG, most recently had right-sided rib fracture approximately 1-1/2 weeks ago which she says is improving. History of back pain with thoracic compression fractures, started on fentanyl patch by PCP yesterday. Patient reports onset of midsternal chest pressure while getting ready for bed prior to arrival. Took 2 nitroglycerin prior to arrival which she states has partially alleviated her discomfort. She was also given aspirin per EMS. Chest pressure is nonradiating and associated with nausea. Denies associated diaphoresis, shortness of breath, palpitations, dizziness. Denies recent fever, chills, abdominal pain, dysuria, diarrhea. Nothing makes her pain worse. Nitroglycerin glycerin made her pain better.Complains of nonproductive cough since she fell and suffered rib fractures.   Past Medical History  Diagnosis Date  . CAD (coronary artery disease)   . Orthostatic hypotension   . Syncope   . Breast cancer ([place])   . GERD (gastroesophageal reflux disease)   . Osteoarthritis   . Degenerative disc disease   . Hypothyroidism   . Hyperlipidemia     Previous intolerance of statins  . Peptic ulcer disease     Patient Active Problem List   Diagnosis Date Noted  . Rib fracture 10/03/2015  . Right carotid bruit 08/01/2015  . Loss of weight 01/12/2015  . Anemia 07/18/2014  . Health care maintenance 07/18/2014  . Hoarseness 07/18/2014  . Abdominal pain 05/05/2014  . Back pain 10/05/2013  . Knee pain 07/14/2013  . Oral pain 05/06/2013  . Itching 01/05/2013  . Breast cancer  ([place]) 11/20/2012  . Syncope 11/20/2012  . Hypothyroidism 08/10/2012  . Difficulty sleeping 08/10/2012  . Dizziness 11/08/2010  . Hyperlipemia 03/23/2009  . HYPERTENSION, BENIGN 03/23/2009  . CAD, NATIVE VESSEL 09/24/2008  . ORTHOSTATIC HYPOTENSION 09/24/2008  . ABNORMAL EKG 09/24/2008    Past Surgical History  Procedure Laterality Date  . Breast lumpectomy  2006  . Abdominal hysterectomy  1970  . Nasal sinus surgery    . Rotator cuff repair    . Appendectomy    . Coronary artery bypass graft  05/2006    x3  . Cholecystectomy    . Biopsy breast  2014  . Breast excisional biopsy Left 2014    neg  . Breast excisional biopsy Left 2006    postive radation    Current Outpatient Rx  Name  Route  Sig  Dispense  Refill  . acetaminophen (TYLENOL) 650 MG CR tablet   Oral   Take 650 mg by mouth every 8 (eight) hours as needed for pain.          Marland Kitchen amitriptyline (ELAVIL) 50 MG tablet   Oral   Take 50 mg by mouth at bedtime.         Marland Kitchen aspirin EC 81 MG tablet   Oral   Take 81 mg by mouth daily.         Marland Kitchen atorvastatin (LIPITOR) 20 MG tablet   Oral   Take 20 mg by mouth at bedtime.         . citalopram (CELEXA) 20 MG tablet      Take 1 tablet by mouth  daily   90 tablet   0   . fentaNYL (DURAGESIC - DOSED MCG/HR) 12 MCG/HR   Transdermal   Place 1 patch onto the skin every 3 (three) days.         . fludrocortisone (FLORINEF) 0.1 MG tablet   Oral   Take 1 tablet (0.1 mg total) by mouth daily.   90 tablet   3   . HYDROcodone-acetaminophen (NORCO/VICODIN) 5-325 MG tablet   Oral   Take 0.5-1 tablets by mouth 3 (three) times daily as needed for moderate pain.   30 tablet   0   . lansoprazole (PREVACID) 30 MG capsule   Oral   Take 30 mg by mouth daily.           Marland Kitchen levothyroxine (SYNTHROID, LEVOTHROID) 50 MCG tablet      Take 1 tablet by mouth  daily   90 tablet   0   . nitroGLYCERIN (NITROSTAT) 0.4 MG SL tablet   Sublingual   Place 1 tablet (0.4 mg  total) under the tongue every 5 (five) minutes as needed for chest pain.   25 tablet   0   . oxyCODONE-acetaminophen (PERCOCET/ROXICET) 5-325 MG tablet   Oral   Take 1 tablet by mouth every 6 (six) hours as needed.           Allergies Alendronate sodium and Prednisone  Family History  Problem Relation Age of Onset  . Heart attack Sister   . Pancreatic cancer Brother     Social History Social History  Substance Use Topics  . Smoking status: Never Smoker   . Smokeless tobacco: Never Used  . Alcohol Use: No    Review of Systems  Constitutional: No fever/chills. Eyes: No visual changes. ENT: No sore throat. Cardiovascular: Positive for chest pain. Respiratory: Denies shortness of breath. Gastrointestinal: No abdominal pain.  Positive for nausea, no vomiting.  No diarrhea.  No constipation. Genitourinary: Negative for dysuria. Musculoskeletal: Positive for back pain. Skin: Negative for rash. Neurological: Negative for headaches, focal weakness or numbness.  10-point ROS otherwise negative.  ____________________________________________   PHYSICAL EXAM:  VITAL SIGNS: ED Triage Vitals  Enc Vitals Group     BP 10/11/15 2344 134/80 mmHg     Pulse Rate 10/11/15 2336 90     Resp 10/11/15 2344 23     Temp 10/11/15 2336 98.5 F (36.9 C)     Temp Source 10/11/15 2336 Oral     SpO2 10/11/15 2344 100 %     Weight 10/11/15 2336 121 lb (54.885 kg)     Height 10/11/15 2336 5\' 4"  (1.626 m)     Head Cir --      Peak Flow --      Pain Score 10/11/15 2337 7     Pain Loc --      Pain Edu? --      Excl. in [place]? --     Constitutional: Alert and oriented. Well appearing and in no acute distress. Eyes: Conjunctivae are normal. PERRL. EOMI. Head: Atraumatic. Nose: No congestion/rhinnorhea. Mouth/Throat: Mucous membranes are moist.  Oropharynx non-erythematous. Neck: No stridor.   Cardiovascular: Normal rate, regular rhythm. Grossly normal heart sounds.  Good peripheral  circulation. Respiratory: Normal respiratory effort.  No retractions. Lungs CTAB. Right lateral rib tenderness to palpation. Gastrointestinal: Soft and nontender. No distention. No abdominal bruits. No CVA tenderness. Musculoskeletal: No lower extremity tenderness nor edema.  No joint effusions. Neurologic:  Normal speech and language. No gross focal neurologic deficits  are appreciated.  Skin:  Skin is warm, dry and intact. No rash noted. Psychiatric: Mood and affect are normal. Speech and behavior are normal.  ____________________________________________   LABS (all labs ordered are listed, but only abnormal results are displayed)  Labs Reviewed  CBC WITH DIFFERENTIAL/PLATELET - Abnormal; Notable for the following:    RBC 3.51 (*)    Hemoglobin 10.7 (*)    HCT 30.6 (*)    All other components within normal limits  COMPREHENSIVE METABOLIC PANEL - Abnormal; Notable for the following:    Potassium 3.3 (*)    Glucose, Bld 111 (*)    BUN 30 (*)    Creatinine, Ser 1.12 (*)    Calcium 8.6 (*)    Total Protein 6.2 (*)    Albumin 3.3 (*)    GFR calc non Af Amer 43 (*)    GFR calc Af Amer 50 (*)    All other components within normal limits  CULTURE, BLOOD (ROUTINE X 2)  CULTURE, BLOOD (ROUTINE X 2)  TROPONIN I   ____________________________________________  EKG  ED ECG REPORT I, Janny Crute J, the attending physician, personally viewed and interpreted this ECG.   Date: 10/12/2015  EKG Time: 2341  Rate: 83  Rhythm: normal EKG, normal sinus rhythm  Axis: Normal  Intervals:none  ST&T Change: Nonspecific  ____________________________________________  RADIOLOGY  CTA chest interpreted per Dr. Gerilyn Nestle: No evidence of significant pulmonary embolus. Infiltration or edema in the lung bases. Small esophageal hiatal hernia. ____________________________________________   PROCEDURES  Procedure(s) performed: None  Critical Care performed:  No  ____________________________________________   INITIAL IMPRESSION / ASSESSMENT AND PLAN / ED COURSE  Pertinent labs & imaging results that were available during my care of the patient were reviewed by me and considered in my medical decision making (see chart for details).  82 year old female with a history of CAD, recent right-sided rib fractures who presents with midsternal chest pressure partially alleviated by nitroglycerin. Initial troponin unremarkable. Given patient's complaint of chest pain and back pain, will obtain CT chest to evaluate for PE.  ----------------------------------------- 2:47 AM on 10/12/2015 -----------------------------------------  Updated patient of CT results. She is currently laying on her left side secondary to chronic back pain from her thoracic compression fractures. Will discuss with hospitalist to evaluate patient in the emergency department for chest pain rule out, community acquired pneumonia and thoracic back pain secondary to compression fractures. ____________________________________________   FINAL CLINICAL IMPRESSION(S) / ED DIAGNOSES  Final diagnoses:  CAP (community acquired pneumonia)  Chest pain, unspecified chest pain type  Midline thoracic back pain  Compression fracture of body of thoracic vertebra      NEW MEDICATIONS STARTED DURING THIS VISIT:  New Prescriptions   No medications on file     Note:  This document was prepared using Dragon voice recognition software and may include unintentional dictation errors.    Paulette Blanch, MD 10/12/15 [phone]

## 2015-10-12 NOTE — Evaluation (Signed)
Physical Therapy Evaluation Patient Details Name: Angela Howe MRN: [id number] DOB: 01-Feb-1926 Today's Date: 10/12/2015   History of Present Illness  82 y/o female had a fall suffering R 8th rib fracture on top of chronic mid/low back issues ~ 10 days ago.  She comes back to the hospital with chest pain and reports that she has had a hard time at home despite family helping and being involved.  Clinical Impression  Pt is able to do some limited bed mobility and ambulation but has a good bit of rib pain with movement and fatigues quickly with light activity.  She is relatively safe with ambulation but is not able to give a sustained effort.  Pt eager to go home but agrees that even with family assist she should likely go to rehab for a while to build up her strength, safety and tolerance.     Follow Up Recommendations SNF    Equipment Recommendations       Recommendations for Other Services       Precautions / Restrictions Precautions Precautions: Fall Restrictions Weight Bearing Restrictions: No      Mobility  Bed Mobility Overal bed mobility: Modified Independent             General bed mobility comments: Pt able to get to EOB w/o excessive use of rails and with slow but deliberate effort  Transfers Overall transfer level: Modified independent Equipment used: Rolling walker (2 wheeled)             General transfer comment: Pt with definite need of UEs on bed to rise, but she shows good overall confidence and safety   Ambulation/Gait Ambulation/Gait assistance: Min guard Ambulation Distance (Feet): 45 Feet Assistive device: Rolling walker (2 wheeled)       General Gait Details: Pt becomes fatigued with the effort of limited ambulation today.  Pt with some labored breathing and though she shows good effort she is not near her baseline with confidence, distance or overall ability to ambulate.  Stairs            Wheelchair Mobility    Modified  Rankin (Stroke Patients Only)       Balance                                             Pertinent Vitals/Pain Pain Score: 8  Pain Location: R rib cage    Home Living Family/patient expects to be discharged to:: Private residence Living Arrangements: Alone Available Help at Discharge: Family (daughter and grandson check in daily)   Home Access: Level entry       Home Equipment: Environmental consultant - 4 wheels;Walker - 2 wheels;Cane - single point      Prior Function Level of Independence: Independent with assistive device(s)         Comments: Pt does not drive or do a lot of community activites but goes to church with family and can go out to eat.  She dresses and bathes herself, and does light housework.     Hand Dominance        Extremity/Trunk Assessment   Upper Extremity Assessment: Generalized weakness (more pain with R shoulder elevation secondary to ribs)           Lower Extremity Assessment: Generalized weakness         Communication   Communication: No difficulties  Cognition  Arousal/Alertness: Awake/alert Behavior During Therapy: WFL for tasks assessed/performed Overall Cognitive Status: Within Functional Limits for tasks assessed                      General Comments      Exercises        Assessment/Plan    PT Assessment Patient needs continued PT services  PT Diagnosis Generalized weakness;Difficulty walking;Acute pain   PT Problem List Decreased strength;Decreased range of motion;Decreased activity tolerance;Decreased balance;Decreased mobility;Decreased coordination;Decreased knowledge of use of DME;Decreased safety awareness  PT Treatment Interventions DME instruction;Gait training;Therapeutic exercise;Therapeutic activities;Functional mobility training;Balance training;Neuromuscular re-education   PT Goals (Current goals can be found in the Care Plan section) Acute Rehab PT Goals Patient Stated Goal: I need to get  stronger at rehab before I go home PT Goal Formulation: With patient Time For Goal Achievement: 10/26/15 Potential to Achieve Goals: Fair    Frequency Min 2X/week   Barriers to discharge        Co-evaluation               End of Session Equipment Utilized During Treatment: Gait belt Activity Tolerance: Patient tolerated treatment well;Patient limited by fatigue Patient left: with bed alarm set;with family/visitor present;with call bell/phone within reach      Functional Assessment Tool Used: clinical judgement Functional Limitation: Mobility: Walking and moving around Mobility: Walking and Moving Around Current Status [id number]): At least 40 percent but less than 60 percent impaired, limited or restricted Mobility: Walking and Moving Around Goal Status [phone]): At least 20 percent but less than 40 percent impaired, limited or restricted    Time: 1029-1058 PT Time Calculation (min) (ACUTE ONLY): 29 min   Charges:   PT Evaluation $PT Eval Low Complexity: 1 Procedure     PT G Codes:   PT G-Codes **NOT FOR INPATIENT CLASS** Functional Assessment Tool Used: clinical judgement Functional Limitation: Mobility: Walking and moving around Mobility: Walking and Moving Around Current Status [id number]): At least 40 percent but less than 60 percent impaired, limited or restricted Mobility: Walking and Moving Around Goal Status [phone]): At least 20 percent but less than 40 percent impaired, limited or restricted    Kreg Shropshire, DPT 10/12/2015, 11:57 AM

## 2015-10-13 DIAGNOSIS — E876 Hypokalemia: Secondary | ICD-10-CM | POA: Diagnosis not present

## 2015-10-13 DIAGNOSIS — M1990 Unspecified osteoarthritis, unspecified site: Secondary | ICD-10-CM | POA: Diagnosis not present

## 2015-10-13 DIAGNOSIS — R531 Weakness: Secondary | ICD-10-CM | POA: Diagnosis not present

## 2015-10-13 DIAGNOSIS — R5383 Other fatigue: Secondary | ICD-10-CM | POA: Diagnosis not present

## 2015-10-13 DIAGNOSIS — Z8711 Personal history of peptic ulcer disease: Secondary | ICD-10-CM | POA: Diagnosis not present

## 2015-10-13 DIAGNOSIS — W19XXXD Unspecified fall, subsequent encounter: Secondary | ICD-10-CM | POA: Diagnosis not present

## 2015-10-13 DIAGNOSIS — K5900 Constipation, unspecified: Secondary | ICD-10-CM | POA: Diagnosis not present

## 2015-10-13 DIAGNOSIS — R6889 Other general symptoms and signs: Secondary | ICD-10-CM | POA: Diagnosis not present

## 2015-10-13 DIAGNOSIS — Z9181 History of falling: Secondary | ICD-10-CM | POA: Diagnosis not present

## 2015-10-13 DIAGNOSIS — S2241XA Multiple fractures of ribs, right side, initial encounter for closed fracture: Secondary | ICD-10-CM | POA: Diagnosis not present

## 2015-10-13 DIAGNOSIS — D649 Anemia, unspecified: Secondary | ICD-10-CM | POA: Diagnosis not present

## 2015-10-13 DIAGNOSIS — I10 Essential (primary) hypertension: Secondary | ICD-10-CM | POA: Diagnosis not present

## 2015-10-13 DIAGNOSIS — Z7982 Long term (current) use of aspirin: Secondary | ICD-10-CM | POA: Diagnosis not present

## 2015-10-13 DIAGNOSIS — J189 Pneumonia, unspecified organism: Secondary | ICD-10-CM | POA: Diagnosis not present

## 2015-10-13 DIAGNOSIS — Z951 Presence of aortocoronary bypass graft: Secondary | ICD-10-CM | POA: Diagnosis not present

## 2015-10-13 DIAGNOSIS — S2231XD Fracture of one rib, right side, subsequent encounter for fracture with routine healing: Secondary | ICD-10-CM | POA: Diagnosis not present

## 2015-10-13 DIAGNOSIS — G8929 Other chronic pain: Secondary | ICD-10-CM | POA: Diagnosis not present

## 2015-10-13 DIAGNOSIS — S2239XA Fracture of one rib, unspecified side, initial encounter for closed fracture: Secondary | ICD-10-CM | POA: Diagnosis not present

## 2015-10-13 DIAGNOSIS — E039 Hypothyroidism, unspecified: Secondary | ICD-10-CM | POA: Diagnosis not present

## 2015-10-13 DIAGNOSIS — K219 Gastro-esophageal reflux disease without esophagitis: Secondary | ICD-10-CM | POA: Diagnosis not present

## 2015-10-13 DIAGNOSIS — I2510 Atherosclerotic heart disease of native coronary artery without angina pectoris: Secondary | ICD-10-CM | POA: Diagnosis not present

## 2015-10-13 DIAGNOSIS — Z853 Personal history of malignant neoplasm of breast: Secondary | ICD-10-CM | POA: Diagnosis not present

## 2015-10-13 DIAGNOSIS — M519 Unspecified thoracic, thoracolumbar and lumbosacral intervertebral disc disorder: Secondary | ICD-10-CM | POA: Diagnosis not present

## 2015-10-13 DIAGNOSIS — R262 Difficulty in walking, not elsewhere classified: Secondary | ICD-10-CM | POA: Diagnosis not present

## 2015-10-13 DIAGNOSIS — E785 Hyperlipidemia, unspecified: Secondary | ICD-10-CM | POA: Diagnosis not present

## 2015-10-13 DIAGNOSIS — R064 Hyperventilation: Secondary | ICD-10-CM | POA: Diagnosis not present

## 2015-10-13 DIAGNOSIS — K279 Peptic ulcer, site unspecified, unspecified as acute or chronic, without hemorrhage or perforation: Secondary | ICD-10-CM | POA: Diagnosis not present

## 2015-10-13 DIAGNOSIS — M4854XA Collapsed vertebra, not elsewhere classified, thoracic region, initial encounter for fracture: Secondary | ICD-10-CM | POA: Diagnosis not present

## 2015-10-13 DIAGNOSIS — Z8781 Personal history of (healed) traumatic fracture: Secondary | ICD-10-CM | POA: Diagnosis not present

## 2015-10-13 DIAGNOSIS — R079 Chest pain, unspecified: Secondary | ICD-10-CM | POA: Diagnosis not present

## 2015-10-13 DIAGNOSIS — K449 Diaphragmatic hernia without obstruction or gangrene: Secondary | ICD-10-CM | POA: Diagnosis not present

## 2015-10-13 DIAGNOSIS — Z743 Need for continuous supervision: Secondary | ICD-10-CM | POA: Diagnosis not present

## 2015-10-13 DIAGNOSIS — Z888 Allergy status to other drugs, medicaments and biological substances status: Secondary | ICD-10-CM | POA: Diagnosis not present

## 2015-10-13 DIAGNOSIS — Z79899 Other long term (current) drug therapy: Secondary | ICD-10-CM | POA: Diagnosis not present

## 2015-10-13 DIAGNOSIS — S2241XD Multiple fractures of ribs, right side, subsequent encounter for fracture with routine healing: Secondary | ICD-10-CM | POA: Diagnosis not present

## 2015-10-13 MED ORDER — CEFUROXIME AXETIL 500 MG PO TABS: 500.0000 mg | ORAL_TABLET | Freq: Two times a day (BID) | ORAL | Status: DC

## 2015-10-13 MED ORDER — AZITHROMYCIN 250 MG PO TABS: ORAL_TABLET | ORAL | Status: DC

## 2015-10-13 MED ORDER — FENTANYL 12 MCG/HR TD PT72: 12.5000 ug | MEDICATED_PATCH | TRANSDERMAL | Status: DC

## 2015-10-13 MED ORDER — OXYCODONE-ACETAMINOPHEN 5-325 MG PO TABS: 1.0000 | ORAL_TABLET | Freq: Four times a day (QID) | ORAL | Status: DC | PRN

## 2015-10-13 NOTE — Progress Notes (Signed)
CSW presented bed offers. Stated she wanted to discuss them with her sister. Her sister just left and will return to [facility] after lunch. CSW will continue to follow and assist.  Ernest Pine, MSW, [place] Work Department [phone]

## 2015-10-13 NOTE — Clinical Social Work Note (Signed)
Clinical Social Work Assessment  Patient Details  Name: Angela Howe MRN: [id number] Date of Birth: 12/12/1925  Date of referral:  10/13/15               Reason for consult:  Discharge Planning                Permission sought to share information with:  Family Supports Permission granted to share information::     Name::        Agency::     Relationship::   (Angela Howe- Daughter)  Contact Information:     Housing/Transportation Living arrangements for the past 2 months:  Single Family Home Source of Information:  Patient Patient Interpreter Needed:  None Criminal Activity/Legal Involvement Pertinent to Current Situation/Hospitalization:  No - Comment as needed Significant Relationships:  Adult Children (Angela Howe- Daughter) Lives with:  Self Do you feel safe going back to the place where you live?  Yes Need for family participation in patient care:  Yes (Comment) (Angela Howe- Daughter)  Care giving concerns:  Patient and her daughter feel that patient will benefit from SNF placement.    Social Worker assessment / plan:  CSW met with patient and her daughter at bedside. CSW introduced herself. Per patient she feels she would benefit from SNF. Reported that she did not want to go to [facility]. Granted CSW verbal permission to send SNF referral to SNFs in [place]. Awaiting bed offers.   Presented bed offers. Accepted bed at [facility]. CSW informed Doug- Admissions Director at [facility].   Clinical Social Worker informed that patient will be medically ready to discharge to [facility]. Patient and her daughter are in a agreement with plan. CSW called Doug- Admissions Coordinator at [facility] to confirm that patient's bed is ready. Provided patient's room number 503 and number to call for report [phone] . All discharge information faxed to [facility]. RN will call report and patient will discharge to [facility] via her daughter   Employment status:  Retired Insurance  information:  Managed Medicare PT Recommendations:  Skilled Nursing Facility Information / Referral to community resources:  Skilled Nursing Facility  Patient/Family's Response to care:  Patient and family are in agreement to discharge to [facility].   Patient/Family's Understanding of and Emotional Response to Diagnosis, Current Treatment, and Prognosis:  Patient is appreciated of CSW assistance.   Emotional Assessment Appearance:  Appears stated age Attitude/Demeanor/Rapport:   (None) Affect (typically observed):  Accepting, Calm, Pleasant Orientation:  Oriented to Self, Oriented to Place, Oriented to  Time, Oriented to Situation Alcohol / Substance use:  Not Applicable Psych involvement (Current and /or in the community):  No (Comment)  Discharge Needs  Concerns to be addressed:  Discharge Planning Concerns Readmission within the last 30 days:  No Current discharge risk:  None Barriers to Discharge:  No Barriers Identified    E , LCSW 10/13/2015, 3:14 PM  

## 2015-10-13 NOTE — NC FL2 (Signed)
[place] LEVEL OF CARE SCREENING TOOL     IDENTIFICATION  Patient Name: Angela Howe Birthdate: 11/02/1925 Sex: female Admission Date (Current Location): 10/11/2015  [place] and [place] Number:  Engineering geologist and Address:  [facility], [address] [place], [place] [address]      Provider Number: [id number]  Attending Physician Name and Address:  Loletha Grayer, MD  Relative Name and Phone Number:       Current Level of Care: Hospital Recommended Level of Care: [place] Prior Approval Number:    Date Approved/Denied:   PASRR Number:  ([id number])  Discharge Plan: SNF    Current Diagnoses: Patient Active Problem List   Diagnosis Date Noted  . Chest pain 10/12/2015  . Community acquired pneumonia 10/12/2015  . Rib fracture 10/03/2015  . Right carotid bruit 08/01/2015  . Loss of weight 01/12/2015  . Anemia 07/18/2014  . Health care maintenance 07/18/2014  . Hoarseness 07/18/2014  . Abdominal pain 05/05/2014  . Back pain 10/05/2013  . Knee pain 07/14/2013  . Oral pain 05/06/2013  . Itching 01/05/2013  . Breast cancer ([place]) 11/20/2012  . Syncope 11/20/2012  . Hypothyroidism 08/10/2012  . Difficulty sleeping 08/10/2012  . Dizziness 11/08/2010  . Hyperlipemia 03/23/2009  . HYPERTENSION, BENIGN 03/23/2009  . CAD, NATIVE VESSEL 09/24/2008  . ORTHOSTATIC HYPOTENSION 09/24/2008  . ABNORMAL EKG 09/24/2008    Orientation RESPIRATION BLADDER Height & Weight     Self, Time, Situation, Place  Normal Continent Weight: 116 lb 12.8 oz (52.98 kg) Height:  5\' 4"  (162.6 cm)  BEHAVIORAL SYMPTOMS/MOOD NEUROLOGICAL BOWEL NUTRITION STATUS   (None)  (None) Continent Diet (Heart)  AMBULATORY STATUS COMMUNICATION OF NEEDS Skin   Extensive Assist Verbally Normal                       Personal Care Assistance Level of Assistance  Bathing, Feeding, Dressing Bathing Assistance: Limited  assistance Feeding assistance: Independent Dressing Assistance: Limited assistance     Functional Limitations Info  Sight, Hearing, Speech   Hearing Info: Adequate Speech Info: Adequate    SPECIAL CARE FACTORS FREQUENCY  PT (By licensed PT), OT (By licensed OT)     PT Frequency:  (5) OT Frequency:  (5)            Contractures      Additional Factors Info  Code Status, Allergies Code Status Info:  (DNR) Allergies Info:  (Alendronate Sodium, Prednisone)           Current Medications (10/13/2015):  This is the current hospital active medication list Current Facility-Administered Medications  Medication Dose Route Frequency Provider Last Rate Last Dose  . acetaminophen (TYLENOL) tablet 650 mg  650 mg Oral Q8H PRN Debby Crosley, MD      . amitriptyline (ELAVIL) tablet 50 mg  50 mg Oral QHS Debby Crosley, MD   50 mg at 10/12/15 2117  . aspirin EC tablet 81 mg  81 mg Oral Daily Debby Crosley, MD   81 mg at 10/13/15 1007  . atorvastatin (LIPITOR) tablet 20 mg  20 mg Oral QHS Debby Crosley, MD   20 mg at 10/12/15 2117  . azithromycin (ZITHROMAX) tablet 500 mg  500 mg Oral Daily Debby Crosley, MD   500 mg at 10/13/15 1007  . cefTRIAXone (ROCEPHIN) 1 g in dextrose 5 % 50 mL IVPB  1 g Intravenous Q24H Paulette Blanch, MD 100 mL/hr at 10/13/15 0245 1 g  at 10/13/15 0245  . citalopram (CELEXA) tablet 20 mg  20 mg Oral Daily Debby Crosley, MD   20 mg at 10/13/15 1007  . fentaNYL (DURAGESIC - dosed mcg/hr) 12.5 mcg  12.5 mcg Transdermal Q72H Debby Crosley, MD      . fludrocortisone (FLORINEF) tablet 0.1 mg  0.1 mg Oral Daily Debby Crosley, MD   0.1 mg at 10/13/15 1008  . levothyroxine (SYNTHROID, LEVOTHROID) tablet 50 mcg  50 mcg Oral QAC breakfast Debby Crosley, MD   50 mcg at 10/13/15 0900  . nitroGLYCERIN (NITROSTAT) SL tablet 0.4 mg  0.4 mg Sublingual Q5 min PRN Debby Crosley, MD      . oxyCODONE-acetaminophen (PERCOCET/ROXICET) 5-325 MG per tablet 1 tablet  1 tablet Oral Q6H PRN Quintella Baton, MD   1 tablet at 10/12/15 1648  . pantoprazole (PROTONIX) EC tablet 40 mg  40 mg Oral Daily Debby Crosley, MD   40 mg at 10/13/15 1007     Discharge Medications: Please see discharge summary for a list of discharge medications.  Relevant Imaging Results:  Relevant Lab Results:   Additional Information  (SSN [id number])  Lorenso Quarry Athanasia Stanwood, LCSW

## 2015-10-13 NOTE — Discharge Instructions (Signed)

## 2015-10-13 NOTE — Progress Notes (Signed)
Report called to [facility] iv and tele removed will transport to SNF via EMS

## 2015-10-13 NOTE — Discharge Summary (Signed)
[facility] NAME: Angela Howe    MR#:  [id number]  DATE OF BIRTH:  12-09-1925  DATE OF ADMISSION:  10/11/2015 ADMITTING PHYSICIAN: Quintella Baton, MD  DATE OF DISCHARGE: 10/13/2015  PRIMARY CARE PHYSICIAN: Einar Pheasant, MD    ADMISSION DIAGNOSIS:  CAP (community acquired pneumonia) [J18.9] Midline thoracic back pain [M54.6] Chest pain, unspecified chest pain type [R07.9] Compression fracture of body of thoracic vertebra [id number]  DISCHARGE DIAGNOSIS:  Active Problems:   Hyperlipemia   HYPERTENSION, BENIGN   Hypothyroidism   Breast cancer ([place])   Back pain   Chest pain   Community acquired pneumonia   SECONDARY DIAGNOSIS:   Past Medical History  Diagnosis Date  . CAD (coronary artery disease)   . Orthostatic hypotension   . Syncope   . Breast cancer ([place])   . GERD (gastroesophageal reflux disease)   . Osteoarthritis   . Degenerative disc disease   . Hypothyroidism   . Hyperlipidemia     Previous intolerance of statins  . Peptic ulcer disease     HOSPITAL COURSE:   1. Pneumonia. No fever. Normal white count. Patient with recent rib fractures. Switch antibiotics to Ceftin and Zithromax upon discharge. Patient placed on Duragesic patch recently for the rib fracture pain. Can consider getting rid of this medication down in a few weeks. 2. Chest pain. Cardiac enzymes are negative. No further cardiac workup. Could be related to pneumonia and broken ribs or even acid reflux. 3. Weakness physical therapy recommends rehabilitation. We should be able to get out to rehabilitation today. 4. Hypokalemia this was replaced orally 5. Hypothyroidism unspecified on levothyroxine 6. Gastroesophageal reflux disease on PPI 7. Hyperlipidemia unspecified on atorvastatin  DISCHARGE CONDITIONS:   Satisfactory  CONSULTS OBTAINED:  None  DRUG ALLERGIES:   Allergies  Allergen Reactions  . Alendronate Sodium Other (See  Comments)    Reaction:  Restlessness   . Prednisone Other (See Comments)    Reaction:  Restlessness     DISCHARGE MEDICATIONS:   Current Discharge Medication List    START taking these medications   Details  azithromycin (ZITHROMAX) 250 MG tablet Take one tab daily for three more days Qty: 3 each, Refills: 0    cefUROXime (CEFTIN) 500 MG tablet Take 1 tablet (500 mg total) by mouth 2 (two) times daily with a meal. Qty: 16 tablet, Refills: 0      CONTINUE these medications which have CHANGED   Details  fentaNYL (DURAGESIC - DOSED MCG/HR) 12 MCG/HR Place 1 patch (12.5 mcg total) onto the skin every 3 (three) days. Qty: 5 patch, Refills: 0    oxyCODONE-acetaminophen (PERCOCET/ROXICET) 5-325 MG tablet Take 1 tablet by mouth every 6 (six) hours as needed. Qty: 30 tablet, Refills: 0      CONTINUE these medications which have NOT CHANGED   Details  acetaminophen (TYLENOL) 650 MG CR tablet Take 650 mg by mouth every 8 (eight) hours as needed for pain.     amitriptyline (ELAVIL) 50 MG tablet Take 50 mg by mouth at bedtime.    aspirin EC 81 MG tablet Take 81 mg by mouth daily.    atorvastatin (LIPITOR) 20 MG tablet Take 20 mg by mouth at bedtime.    citalopram (CELEXA) 20 MG tablet Take 1 tablet by mouth  daily Qty: 90 tablet, Refills: 0    fludrocortisone (FLORINEF) 0.1 MG tablet Take 1 tablet (0.1 mg total) by mouth daily. Qty: 90 tablet, Refills: 3  lansoprazole (PREVACID) 30 MG capsule Take 30 mg by mouth daily.      levothyroxine (SYNTHROID, LEVOTHROID) 50 MCG tablet Take 1 tablet by mouth  daily Qty: 90 tablet, Refills: 0    nitroGLYCERIN (NITROSTAT) 0.4 MG SL tablet Place 1 tablet (0.4 mg total) under the tongue every 5 (five) minutes as needed for chest pain. Qty: 25 tablet, Refills: 0      STOP taking these medications     HYDROcodone-acetaminophen (NORCO/VICODIN) 5-325 MG tablet          DISCHARGE INSTRUCTIONS:   Follow up with doctor at rehabilitation  one day  If you experience worsening of your admission symptoms, develop shortness of breath, life threatening emergency, suicidal or homicidal thoughts you must seek medical attention immediately by calling 911 or calling your MD immediately  if symptoms less severe.  You Must read complete instructions/literature along with all the possible adverse reactions/side effects for all the Medicines you take and that have been prescribed to you. Take any new Medicines after you have completely understood and accept all the possible adverse reactions/side effects.   Please note  You were cared for by a hospitalist during your hospital stay. If you have any questions about your discharge medications or the care you received while you were in the hospital after you are discharged, you can call the unit and asked to speak with the hospitalist on call if the hospitalist that took care of you is not available. Once you are discharged, your primary care physician will handle any further medical issues. Please note that NO REFILLS for any discharge medications will be authorized once you are discharged, as it is imperative that you return to your primary care physician (or establish a relationship with a primary care physician if you do not have one) for your aftercare needs so that they can reassess your need for medications and monitor your lab values.    Today   CHIEF COMPLAINT:   Chief Complaint  Patient presents with  . Chest Pain    HISTORY OF PRESENT ILLNESS:  Angela Howe  is a 82 y.o. female brought in with chest pain.   VITAL SIGNS:  Blood pressure 182/60, pulse 97, temperature 98.6 F (37 C), temperature source Oral, resp. rate 18, height 5\' 4"  (1.626 m), weight 52.98 kg (116 lb 12.8 oz), SpO2 97 %. All other blood pressure readings were good except for this last one.   PHYSICAL EXAMINATION:  GENERAL:  82 y.o.-year-old patient lying in the bed with no acute distress.  EYES: Pupils equal,  round, reactive to light and accommodation. No scleral icterus. Extraocular muscles intact.  HEENT: Head atraumatic, normocephalic. Oropharynx and nasopharynx clear.  NECK:  Supple, no jugular venous distention. No thyroid enlargement, no tenderness.  LUNGS: Normal breath sounds bilaterally, no wheezing, rales,rhonchi or crepitation. No use of accessory muscles of respiration.  CARDIOVASCULAR: S1, S2 normal. No murmurs, rubs, or gallops.  ABDOMEN: Soft, non-tender, non-distended. Bowel sounds present. No organomegaly or mass.  EXTREMITIES: No pedal edema, cyanosis, or clubbing.  NEUROLOGIC: Cranial nerves II through XII are intact. Muscle strength 5/5 in all extremities. Sensation intact. Gait not checked.  PSYCHIATRIC: The patient is alert and oriented x 3.  SKIN: No obvious rash, lesion, or ulcer.   DATA REVIEW:   CBC  Recent Labs Lab 10/11/15 2333  WBC 9.1  HGB 10.7*  HCT 30.6*  PLT 302    Chemistries   Recent Labs Lab 10/11/15 2333  NA 139  K 3.3*  CL 104  CO2 26  GLUCOSE 111*  BUN 30*  CREATININE 1.12*  CALCIUM 8.6*  AST 25  ALT 20  ALKPHOS 125  BILITOT 0.4    Cardiac Enzymes  Recent Labs Lab 10/12/15 1723  TROPONINI <0.03    Microbiology Results  Results for orders placed or performed during the hospital encounter of 10/11/15  Culture, blood (routine x 2)     Status: None (Preliminary result)   Collection Time: 10/12/15  3:16 AM  Result Value Ref Range Status   Specimen Description BLOOD LEFT ASSIST CONTROL  Final   Special Requests BOTTLES DRAWN AEROBIC AND ANAEROBIC  3CC  Final   Culture NO GROWTH 1 DAY  Final   Report Status PENDING  Incomplete  Culture, blood (routine x 2)     Status: None (Preliminary result)   Collection Time: 10/12/15  3:16 AM  Result Value Ref Range Status   Specimen Description BLOOD RIGHT FOREARM  Final   Special Requests   Final    BOTTLES DRAWN AEROBIC AND ANAEROBIC  AEROIC3CC ANAEROBIC 5CC   Culture NO GROWTH 1 DAY   Final   Report Status PENDING  Incomplete    RADIOLOGY:  Dg Chest 2 View  10/12/2015  CLINICAL DATA:  Patient fell at home last Wednesday with 2 right rib fractures. Now patient is having midsternal chest pain and back pain. EXAM: CHEST  2 VIEW COMPARISON:  10/01/2015 FINDINGS: Postoperative changes in the mediastinum. Normal heart size and pulmonary vascularity. Linear fibrosis in the left lung base. No focal airspace disease or consolidation. No pneumothorax. No blunting of costophrenic angles. Mediastinal contours appear intact. Postoperative and degenerative changes in the right shoulder. Degenerative changes in the spine and left shoulder. Multiple vertebral compression deformities appear similar to previous study and suggests osteoporosis. IMPRESSION: No evidence of active pulmonary disease. Multiple thoracic compression fractures appear unchanged since previous study and are likely due to osteoporosis. Electronically Signed   By: Lucienne Capers M.D.   On: 10/12/2015 01:03   Ct Angio Chest Pe W/cm &/or Wo Cm  10/12/2015  CLINICAL DATA:  Patient fell last Wednesday with 2 right rib fractures. Now with midsternal chest pain. Back pain. EXAM: CT ANGIOGRAPHY CHEST WITH CONTRAST TECHNIQUE: Multidetector CT imaging of the chest was performed using the standard protocol during bolus administration of intravenous contrast. Multiplanar CT image reconstructions and MIPs were obtained to evaluate the vascular anatomy. CONTRAST:  100 mL Isovue 370 COMPARISON:  None. FINDINGS: Technically adequate study with good opacification of the central and segmental pulmonary arteries. No focal filling defects. No evidence of significant pulmonary embolus. Normal heart size. Normal caliber thoracic aorta. No aortic dissection. Great vessel origins are patent. Postoperative changes in the mediastinum with sternotomy and coronary bypass grafts. Small esophageal hiatal hernia. No significant lymphadenopathy in the chest.  Esophagus is decompressed. Ground-glass changes in both lower lungs may indicate edema or pneumonia. No pleural effusions. No pneumothorax. Airways are patent. Multiple compression fractures of the thoracic spine likely indicating osteoporosis. These are not change since previous chest x-ray from 10/01/2015. Included portions of the upper abdominal organs are grossly unremarkable. Surgical absence of the gallbladder. Nodule in the left breast with calcification. Appearance is suggestive of benign lesion. Review of the MIP images confirms the above findings. IMPRESSION: No evidence of significant pulmonary embolus. Infiltration or edema in the lung bases. Small esophageal hiatal hernia. Electronically Signed   By: Lucienne Capers M.D.   On: 10/12/2015 02:28  Management plans discussed with the patient, and she is in agreement. I spoke with the daughter yesterday about rehabilitation. Social worker will contact about bed offers today.  CODE STATUS:  Code Status History    Date Active Date Inactive Code Status Order ID Comments User Context   10/01/2015  5:34 PM 10/02/2015  6:18 PM DNR [id number]  Loletha Grayer, MD ED    Questions for Most Recent Historical Code Status (Order [id number])    Question Answer Comment   In the event of cardiac or respiratory ARREST Do not call a "code blue"    In the event of cardiac or respiratory ARREST Do not perform Intubation, CPR, defibrillation or ACLS    In the event of cardiac or respiratory ARREST Use medication by any route, position, wound care, and other measures to relive pain and suffering. May use oxygen, suction and manual treatment of airway obstruction as needed for comfort.    Comments nurse may pronounce       TOTAL TIME TAKING CARE OF THIS PATIENT: 32 minutes.    Loletha Grayer M.D on 10/13/2015 at 9:58 AM  Between 7am to 6pm - Pager - [phone]  After 6pm go to [email or web address] - password [email or web address] [facility]   [phone]  CC: Primary care physician; Einar Pheasant, MD

## 2015-10-13 NOTE — Clinical Social Work Placement (Signed)
   CLINICAL SOCIAL WORK PLACEMENT  NOTE  Date:  10/13/2015  Patient Details  Name: Angela Howe MRN: [id number] Date of Birth: [birth year]  Clinical Social Work is seeking post-discharge placement for this patient at the [place] level of care (*CSW will initial, date and re-position this form in  chart as items are completed):  Yes   Patient/family provided with [facility] Work Department's list of facilities offering this level of care within the geographic area requested by the patient (or if unable, by the patient's family).  Yes   Patient/family informed of their freedom to choose among providers that offer the needed level of care, that participate in Medicare, Medicaid or managed care program needed by the patient, have an available bed and are willing to accept the patient.  Yes   Patient/family informed of [facility]'s ownership interest in [facility] and [facility], as well as of the fact that they are under no obligation to receive care at these facilities.  PASRR submitted to EDS on 10/13/15     PASRR number received on 10/13/15     Existing PASRR number confirmed on       FL2 transmitted to all facilities in geographic area requested by pt/family on 10/13/15     FL2 transmitted to all facilities within larger geographic area on       Patient informed that his/her managed care company has contracts with or will negotiate with certain facilities, including the following:        Yes   Patient/family informed of bed offers received.  Patient chooses bed at  [facility])     Physician recommends and patient chooses bed at      Patient to be transferred to  ([facility]) on 10/13/15.  Patient to be transferred to facility by  (Family)     Patient family notified on 10/13/15 of transfer.  Name of family member notified:   (Daughter )     PHYSICIAN       Additional Comment:     _______________________________________________ Baldemar Lenis, LCSW 10/13/2015, 4:06 PM

## 2015-10-13 NOTE — Progress Notes (Signed)
RN can not get in touch with patient's daughter. Will arrange EMS for transport.  Ernest Pine, MSW, [facility] Social Work Department [phone]

## 2015-10-15 ENCOUNTER — Telehealth: Payer: Self-pay

## 2015-10-15 NOTE — Telephone Encounter (Signed)
Unable to reach patient.  No answer. No voice mail. Will continue to follow as appropriate with transitional care management. Appointment scheduled with PCP 11/03/15.

## 2015-10-17 LAB — CULTURE, BLOOD (ROUTINE X 2)
CULTURE: NO GROWTH
Culture: NO GROWTH

## 2015-10-18 ENCOUNTER — Telehealth: Payer: Self-pay

## 2015-10-18 NOTE — Telephone Encounter (Signed)
Unable to reach patient.  Awaiting call back from [name] (daughter) to confirm if she is in rehabilitation or home.  Follow up appointment scheduled with PCP for July 5.  Will continue to follow as appropriate.

## 2015-10-19 DIAGNOSIS — K5900 Constipation, unspecified: Secondary | ICD-10-CM | POA: Diagnosis not present

## 2015-10-19 DIAGNOSIS — I2510 Atherosclerotic heart disease of native coronary artery without angina pectoris: Secondary | ICD-10-CM | POA: Diagnosis not present

## 2015-10-19 DIAGNOSIS — J189 Pneumonia, unspecified organism: Secondary | ICD-10-CM | POA: Diagnosis not present

## 2015-10-19 DIAGNOSIS — Z8781 Personal history of (healed) traumatic fracture: Secondary | ICD-10-CM | POA: Diagnosis not present

## 2015-10-24 DIAGNOSIS — M1990 Unspecified osteoarthritis, unspecified site: Secondary | ICD-10-CM | POA: Diagnosis not present

## 2015-10-24 DIAGNOSIS — S2231XD Fracture of one rib, right side, subsequent encounter for fracture with routine healing: Secondary | ICD-10-CM | POA: Diagnosis not present

## 2015-10-24 DIAGNOSIS — Z9181 History of falling: Secondary | ICD-10-CM | POA: Diagnosis not present

## 2015-10-24 DIAGNOSIS — M519 Unspecified thoracic, thoracolumbar and lumbosacral intervertebral disc disorder: Secondary | ICD-10-CM | POA: Diagnosis not present

## 2015-11-01 ENCOUNTER — Telehealth: Payer: Self-pay

## 2015-11-01 NOTE — Telephone Encounter (Signed)
Transition Care Management Follow-up Telephone Call   Date discharged? 10/31/15   How have you been since you were released from the hospital? NO CHEST PAIN.  BACK FEELS BETTER.  SOME RIGHT LEG/KNEE PAIN. I STILL FEEL A LITTLE WEAK.     Do you understand why you were in the hospital? YES, CHEST PAIN, I FELL AND PNEUMONIA.   Do you understand the discharge instructions? YES, USE MY WALKER AT ALL TIMES AND INCREASE ACTIVITY AS TOLERATED.   Where were you discharged to? HOME.   Items Reviewed:  Medications reviewed: YES, STOPPED TAKING HYDROCODONE. STARTED TAKING ZITHROMAX 250MG , CEFTIN 500MG .  CONTINUING THESE MEDICATIONS WHICH HAVE CHANGED FENTANYL 12 MCG/HR, OXYCODONE-ACETAMINOPHEN 5-325MG  .  CONTINUING ALL OTHER MEDICATIONS WHICH HAVE NOT CHANGED.  Allergies reviewed: YES, ALENDRONATE SODIUM, PREDNISONE.  Dietary changes reviewed: YES, REGULAR DIET.  Referrals reviewed: YES, FOLLOW UP WITH PHYSICAL THERAPY AND PCP.   Functional Questionnaire:   Activities of Daily Living (ADLs):   She states they are independent in the following: Bathing, dressing, grooming, self feeding, toileting. States they require assistance with the following: Ambulating (walker in use), meal prep.  Family and friends to assist.   Any transportation issues/concerns?: NO.   Any patient concerns? NONE.   Confirmed importance and date/time of follow-up visits scheduled YES, appointment scheduled 11/04/15 at 10:30.  Provider Appointment booked with Dr. Nicki Reaper (PCP).  Confirmed with patient if condition begins to worsen call PCP or go to the ER.  Patient was given the office number and encouraged to call back with question or concerns.  : YES, VERBALIZED UNDERSTANDING.

## 2015-11-03 ENCOUNTER — Ambulatory Visit: Payer: Medicare Other | Admitting: Internal Medicine

## 2015-11-04 ENCOUNTER — Encounter: Payer: Self-pay | Admitting: Internal Medicine

## 2015-11-04 ENCOUNTER — Ambulatory Visit (INDEPENDENT_AMBULATORY_CARE_PROVIDER_SITE_OTHER): Payer: Medicare Other | Admitting: Internal Medicine

## 2015-11-04 VITALS — BP 138/72 | HR 88 | Temp 97.8°F | Resp 18 | Ht 62.0 in | Wt 118.2 lb

## 2015-11-04 DIAGNOSIS — I2510 Atherosclerotic heart disease of native coronary artery without angina pectoris: Secondary | ICD-10-CM | POA: Diagnosis not present

## 2015-11-04 DIAGNOSIS — I10 Essential (primary) hypertension: Secondary | ICD-10-CM | POA: Diagnosis not present

## 2015-11-04 DIAGNOSIS — R634 Abnormal weight loss: Secondary | ICD-10-CM

## 2015-11-04 DIAGNOSIS — D649 Anemia, unspecified: Secondary | ICD-10-CM

## 2015-11-04 DIAGNOSIS — E785 Hyperlipidemia, unspecified: Secondary | ICD-10-CM | POA: Diagnosis not present

## 2015-11-04 DIAGNOSIS — S2231XD Fracture of one rib, right side, subsequent encounter for fracture with routine healing: Secondary | ICD-10-CM

## 2015-11-04 DIAGNOSIS — J189 Pneumonia, unspecified organism: Secondary | ICD-10-CM

## 2015-11-04 LAB — BASIC METABOLIC PANEL
BUN: 15 mg/dL (ref 6–23)
CALCIUM: 8.8 mg/dL (ref 8.4–10.5)
CHLORIDE: 104 meq/L (ref 96–112)
CO2: 29 meq/L (ref 19–32)
CREATININE: 1 mg/dL (ref 0.40–1.20)
GFR: 55.64 mL/min — ABNORMAL LOW (ref >60.00)
GLUCOSE: 84 mg/dL (ref 70–99)
Potassium: 3.9 mEq/L (ref 3.5–5.1)
Sodium: 139 mEq/L (ref 135–145)

## 2015-11-04 LAB — CBC WITH DIFFERENTIAL/PLATELET
Basophils Absolute: 0 10*3/uL (ref 0.0–0.1)
Basophils Relative: 0.1 % (ref 0.0–3.0)
EOS PCT: 0.6 % (ref 0.0–5.0)
Eosinophils Absolute: 0 10*3/uL (ref 0.0–0.7)
HCT: 32.7 % — ABNORMAL LOW (ref 36.0–46.0)
HEMOGLOBIN: 10.9 g/dL — AB (ref 12.0–15.0)
LYMPHS ABS: 1.3 10*3/uL (ref 0.7–4.0)
Lymphocytes Relative: 22.6 % (ref 12.0–46.0)
MCHC: 33.3 g/dL (ref 30.0–36.0)
MCV: 88.9 fl (ref 78.0–100.0)
MONOS PCT: 6.3 % (ref 3.0–12.0)
Monocytes Absolute: 0.4 10*3/uL (ref 0.1–1.0)
Neutro Abs: 4.2 10*3/uL (ref 1.4–7.7)
Neutrophils Relative %: 70.4 % (ref 43.0–77.0)
Platelets: 246 10*3/uL (ref 150.0–400.0)
RBC: 3.67 Mil/uL — AB (ref 3.87–5.11)
RDW: 15.1 % (ref 11.5–15.5)
WBC: 5.9 10*3/uL (ref 4.0–10.5)

## 2015-11-04 LAB — HEPATIC FUNCTION PANEL
ALBUMIN: 3.4 g/dL — AB (ref 3.5–5.2)
ALK PHOS: 171 U/L — AB (ref 39–117)
ALT: 52 U/L — ABNORMAL HIGH (ref 0–35)
AST: 42 U/L — AB (ref 0–37)
BILIRUBIN DIRECT: 0.1 mg/dL (ref 0.0–0.3)
Total Bilirubin: 0.4 mg/dL (ref 0.2–1.2)
Total Protein: 6.2 g/dL (ref 6.0–8.3)

## 2015-11-04 NOTE — Progress Notes (Signed)
Patient ID: Angela Howe, female   DOB: 06/07/1925, 82 y.o.   MRN: [id number]   Subjective:    Patient ID: Angela Howe, female    DOB: 1925-07-03, 82 y.o.   MRN: [id number]  HPI  Patient here for hospital follow up.  She was admitted 10/11/15 and diagnosed with pneumonia.  Had recent rib fractures as well.  Was treated with abx.  duragesic patch placed for pain from the fractures.  She was discharged to rehab.  On 10/12/15, had CT chest that revealed no evidence of significant pulmonary embolus, infiltrate or edema.  Did have a small hiatal hernia.  She received therapy while in rehab.  Discharged home 10/31/15.  States gradually getting stronger.  She is eating.  Her daughter is helping to care for her.  No chest pain.  No increased sob.  No abdominal pain or cramping.  Bowels stable.  Sees Dr Sharlet Salina for her back.   Past Medical History  Diagnosis Date  . CAD (coronary artery disease)   . Orthostatic hypotension   . Syncope   . Breast cancer ([place])   . GERD (gastroesophageal reflux disease)   . Osteoarthritis   . Degenerative disc disease   . Hypothyroidism   . Hyperlipidemia     Previous intolerance of statins  . Peptic ulcer disease    Past Surgical History  Procedure Laterality Date  . Breast lumpectomy  2006  . Abdominal hysterectomy  1970  . Nasal sinus surgery    . Rotator cuff repair    . Appendectomy    . Coronary artery bypass graft  05/2006    x3  . Cholecystectomy    . Biopsy breast  2014  . Breast excisional biopsy Left 2014    neg  . Breast excisional biopsy Left 2006    postive radation   Family History  Problem Relation Age of Onset  . Heart attack Sister   . Pancreatic cancer Brother    Social History   Social History  . Marital Status: Widowed    Spouse Name: N/A  . Number of Children: N/A  . Years of Education: N/A   Occupational History  . Retired    Social History Main Topics  . Smoking status: Never Smoker   . Smokeless tobacco:  Never Used  . Alcohol Use: No  . Drug Use: No  . Sexual Activity: Not Asked   Other Topics Concern  . None   Social History Narrative   Widowed   Gets regular exercise    Outpatient Encounter Prescriptions as of 11/04/2015  Medication Sig  . acetaminophen (TYLENOL) 650 MG CR tablet Take 650 mg by mouth every 8 (eight) hours as needed for pain.   Marland Kitchen amitriptyline (ELAVIL) 50 MG tablet Take 50 mg by mouth at bedtime.  Marland Kitchen aspirin EC 81 MG tablet Take 81 mg by mouth daily.  Marland Kitchen atorvastatin (LIPITOR) 20 MG tablet Take 20 mg by mouth at bedtime.  . citalopram (CELEXA) 20 MG tablet Take 1 tablet by mouth  daily  . fentaNYL (DURAGESIC - DOSED MCG/HR) 12 MCG/HR Place 1 patch (12.5 mcg total) onto the skin every 3 (three) days.  . fludrocortisone (FLORINEF) 0.1 MG tablet Take 1 tablet (0.1 mg total) by mouth daily.  . lansoprazole (PREVACID) 30 MG capsule Take 30 mg by mouth daily.    Marland Kitchen levothyroxine (SYNTHROID, LEVOTHROID) 50 MCG tablet Take 1 tablet by mouth  daily  . nitroGLYCERIN (NITROSTAT) 0.4 MG SL tablet Place 1  tablet (0.4 mg total) under the tongue every 5 (five) minutes as needed for chest pain.  Marland Kitchen oxyCODONE-acetaminophen (PERCOCET/ROXICET) 5-325 MG tablet Take 1 tablet by mouth every 6 (six) hours as needed.  . [DISCONTINUED] azithromycin (ZITHROMAX) 250 MG tablet Take one tab daily for three more days  . [DISCONTINUED] cefUROXime (CEFTIN) 500 MG tablet Take 1 tablet (500 mg total) by mouth 2 (two) times daily with a meal.   No facility-administered encounter medications on file as of 11/04/2015.    Review of Systems  Constitutional: Negative for appetite change and unexpected weight change.  HENT: Negative for congestion and sinus pressure.   Respiratory: Negative for cough, chest tightness and shortness of breath.   Cardiovascular: Negative for chest pain, palpitations and leg swelling.  Gastrointestinal: Negative for nausea, vomiting, abdominal pain and diarrhea.  Genitourinary:  Negative for dysuria and difficulty urinating.  Musculoskeletal:       Rib pain has improved from previous rib fractures.  Sees Dr Sharlet Salina for her back pain.   Skin: Negative for color change and rash.  Neurological: Negative for dizziness, light-headedness and headaches.  Psychiatric/Behavioral: Negative for dysphoric mood and agitation.       Objective:     Blood pressure rechecked by me:  138/72  Physical Exam  Constitutional: She appears well-developed and well-nourished. No distress.  HENT:  Nose: Nose normal.  Mouth/Throat: Oropharynx is clear and moist.  Neck: Neck supple. No thyromegaly present.  Cardiovascular: Normal rate and regular rhythm.   Pulmonary/Chest: Breath sounds normal. No respiratory distress. She has no wheezes.  Abdominal: Soft. Bowel sounds are normal. There is no tenderness.  Musculoskeletal: She exhibits no edema or tenderness.  Lymphadenopathy:    She has no cervical adenopathy.  Skin: No rash noted. No erythema.  Psychiatric: She has a normal mood and affect. Her behavior is normal.    BP 138/72 mmHg  Pulse 88  Temp(Src) 97.8 F (36.6 C) (Oral)  Resp 18  Ht 5\' 2"  (1.575 m)  Wt 118 lb 4 oz (53.638 kg)  BMI 21.62 kg/m2  SpO2 96% Wt Readings from Last 3 Encounters:  11/04/15 118 lb 4 oz (53.638 kg)  10/12/15 116 lb 12.8 oz (52.98 kg)  10/07/15 121 lb (54.885 kg)     Lab Results  Component Value Date   WBC 5.9 11/04/2015   HGB 10.9* 11/04/2015   HCT 32.7* 11/04/2015   PLT 246.0 11/04/2015   GLUCOSE 84 11/04/2015   CHOL 114 07/16/2015   TRIG 128.0 07/16/2015   HDL 47.90 07/16/2015   LDLCALC 41 07/16/2015   ALT 52* 11/04/2015   AST 42* 11/04/2015   NA 139 11/04/2015   K 3.9 11/04/2015   CL 104 11/04/2015   CREATININE 1.00 11/04/2015   BUN 15 11/04/2015   CO2 29 11/04/2015   TSH 1.73 01/12/2015   INR 1.14 10/01/2015    Dg Chest 2 View  10/12/2015  CLINICAL DATA:  Patient fell at home last Wednesday with 2 right rib fractures.  Now patient is having midsternal chest pain and back pain. EXAM: CHEST  2 VIEW COMPARISON:  10/01/2015 FINDINGS: Postoperative changes in the mediastinum. Normal heart size and pulmonary vascularity. Linear fibrosis in the left lung base. No focal airspace disease or consolidation. No pneumothorax. No blunting of costophrenic angles. Mediastinal contours appear intact. Postoperative and degenerative changes in the right shoulder. Degenerative changes in the spine and left shoulder. Multiple vertebral compression deformities appear similar to previous study and suggests osteoporosis. IMPRESSION: No  evidence of active pulmonary disease. Multiple thoracic compression fractures appear unchanged since previous study and are likely due to osteoporosis. Electronically Signed   By: Lucienne Capers M.D.   On: 10/12/2015 01:03   Ct Angio Chest Pe W/cm &/or Wo Cm  10/12/2015  CLINICAL DATA:  Patient fell last Wednesday with 2 right rib fractures. Now with midsternal chest pain. Back pain. EXAM: CT ANGIOGRAPHY CHEST WITH CONTRAST TECHNIQUE: Multidetector CT imaging of the chest was performed using the standard protocol during bolus administration of intravenous contrast. Multiplanar CT image reconstructions and MIPs were obtained to evaluate the vascular anatomy. CONTRAST:  100 mL Isovue 370 COMPARISON:  None. FINDINGS: Technically adequate study with good opacification of the central and segmental pulmonary arteries. No focal filling defects. No evidence of significant pulmonary embolus. Normal heart size. Normal caliber thoracic aorta. No aortic dissection. Great vessel origins are patent. Postoperative changes in the mediastinum with sternotomy and coronary bypass grafts. Small esophageal hiatal hernia. No significant lymphadenopathy in the chest. Esophagus is decompressed. Ground-glass changes in both lower lungs may indicate edema or pneumonia. No pleural effusions. No pneumothorax. Airways are patent. Multiple  compression fractures of the thoracic spine likely indicating osteoporosis. These are not change since previous chest x-ray from 10/01/2015. Included portions of the upper abdominal organs are grossly unremarkable. Surgical absence of the gallbladder. Nodule in the left breast with calcification. Appearance is suggestive of benign lesion. Review of the MIP images confirms the above findings. IMPRESSION: No evidence of significant pulmonary embolus. Infiltration or edema in the lung bases. Small esophageal hiatal hernia. Electronically Signed   By: Lucienne Capers M.D.   On: 10/12/2015 02:28       Assessment & Plan:   Problem List Items Addressed This Visit    Anemia - Primary   Relevant Orders   CBC with Differential/Platelet (Completed)   CAD, NATIVE VESSEL    Currently without symptoms.  Followed by cardiology.       Community acquired pneumonia    Was diagnosed in the hospital.  CT on 10/12/15 clear.  No increased congestion.  Breathing stable.  Follow.        Hyperlipemia    On lipitor.  Follow lipid panel and liver function tests.        Relevant Orders   Hepatic function panel (Completed)   HYPERTENSION, BENIGN    Blood pressure on recheck improved.  Same medication regimen.  Follow pressures.  Recheck metabolic panel.        Relevant Orders   Basic metabolic panel (Completed)   Loss of weight    Weight stable from previous check.  Actually up a couple of pounds.  Follow.       Rib fracture    Pain improved.  Would like to check bone density.  Schedule.            Einar Pheasant, MD

## 2015-11-04 NOTE — Progress Notes (Signed)
Pre-visit discussion using our clinic review tool. No additional management support is needed unless otherwise documented below in the visit note.  

## 2015-11-05 ENCOUNTER — Other Ambulatory Visit: Payer: Self-pay | Admitting: Internal Medicine

## 2015-11-05 DIAGNOSIS — R7989 Other specified abnormal findings of blood chemistry: Secondary | ICD-10-CM

## 2015-11-05 DIAGNOSIS — D649 Anemia, unspecified: Secondary | ICD-10-CM

## 2015-11-05 DIAGNOSIS — R945 Abnormal results of liver function studies: Secondary | ICD-10-CM

## 2015-11-05 NOTE — Progress Notes (Signed)
Order placed for f/u labs.  

## 2015-11-06 ENCOUNTER — Other Ambulatory Visit: Payer: Self-pay | Admitting: Internal Medicine

## 2015-11-06 DIAGNOSIS — R7989 Other specified abnormal findings of blood chemistry: Secondary | ICD-10-CM

## 2015-11-06 DIAGNOSIS — R945 Abnormal results of liver function studies: Secondary | ICD-10-CM

## 2015-11-06 NOTE — Progress Notes (Signed)
Order placed for abdominal ultrasound.   

## 2015-11-07 ENCOUNTER — Encounter: Payer: Self-pay | Admitting: Internal Medicine

## 2015-11-07 DIAGNOSIS — W1830XD Fall on same level, unspecified, subsequent encounter: Secondary | ICD-10-CM | POA: Diagnosis not present

## 2015-11-07 DIAGNOSIS — Z951 Presence of aortocoronary bypass graft: Secondary | ICD-10-CM | POA: Diagnosis not present

## 2015-11-07 DIAGNOSIS — S2231XD Fracture of one rib, right side, subsequent encounter for fracture with routine healing: Secondary | ICD-10-CM | POA: Diagnosis not present

## 2015-11-07 DIAGNOSIS — M8448XD Pathological fracture, other site, subsequent encounter for fracture with routine healing: Secondary | ICD-10-CM | POA: Diagnosis not present

## 2015-11-07 DIAGNOSIS — Z853 Personal history of malignant neoplasm of breast: Secondary | ICD-10-CM | POA: Diagnosis not present

## 2015-11-07 DIAGNOSIS — I2510 Atherosclerotic heart disease of native coronary artery without angina pectoris: Secondary | ICD-10-CM | POA: Diagnosis not present

## 2015-11-07 DIAGNOSIS — M5117 Intervertebral disc disorders with radiculopathy, lumbosacral region: Secondary | ICD-10-CM | POA: Diagnosis not present

## 2015-11-07 DIAGNOSIS — M4807 Spinal stenosis, lumbosacral region: Secondary | ICD-10-CM | POA: Diagnosis not present

## 2015-11-07 DIAGNOSIS — Z9181 History of falling: Secondary | ICD-10-CM | POA: Diagnosis not present

## 2015-11-07 DIAGNOSIS — Z923 Personal history of irradiation: Secondary | ICD-10-CM | POA: Diagnosis not present

## 2015-11-07 DIAGNOSIS — I10 Essential (primary) hypertension: Secondary | ICD-10-CM | POA: Diagnosis not present

## 2015-11-07 DIAGNOSIS — M1611 Unilateral primary osteoarthritis, right hip: Secondary | ICD-10-CM | POA: Diagnosis not present

## 2015-11-07 NOTE — Assessment & Plan Note (Signed)
Was diagnosed in the hospital.  CT on 10/12/15 clear.  No increased congestion.  Breathing stable.  Follow.

## 2015-11-07 NOTE — Assessment & Plan Note (Signed)
Weight stable from previous check.  Actually up a couple of pounds.  Follow.

## 2015-11-07 NOTE — Assessment & Plan Note (Signed)
Currently without symptoms.  Followed by cardiology.

## 2015-11-07 NOTE — Assessment & Plan Note (Signed)
On lipitor.  Follow lipid panel and liver function tests.   

## 2015-11-07 NOTE — Assessment & Plan Note (Signed)
Blood pressure on recheck improved.  Same medication regimen.  Follow pressures.  Recheck metabolic panel.

## 2015-11-07 NOTE — Assessment & Plan Note (Signed)
Pain improved.  Would like to check bone density.  Schedule.

## 2015-11-09 DIAGNOSIS — M8448XD Pathological fracture, other site, subsequent encounter for fracture with routine healing: Secondary | ICD-10-CM | POA: Diagnosis not present

## 2015-11-09 DIAGNOSIS — Z923 Personal history of irradiation: Secondary | ICD-10-CM | POA: Diagnosis not present

## 2015-11-09 DIAGNOSIS — W1830XD Fall on same level, unspecified, subsequent encounter: Secondary | ICD-10-CM | POA: Diagnosis not present

## 2015-11-09 DIAGNOSIS — S2231XD Fracture of one rib, right side, subsequent encounter for fracture with routine healing: Secondary | ICD-10-CM | POA: Diagnosis not present

## 2015-11-09 DIAGNOSIS — M5117 Intervertebral disc disorders with radiculopathy, lumbosacral region: Secondary | ICD-10-CM | POA: Diagnosis not present

## 2015-11-09 DIAGNOSIS — Z853 Personal history of malignant neoplasm of breast: Secondary | ICD-10-CM | POA: Diagnosis not present

## 2015-11-09 DIAGNOSIS — I10 Essential (primary) hypertension: Secondary | ICD-10-CM | POA: Diagnosis not present

## 2015-11-09 DIAGNOSIS — I2510 Atherosclerotic heart disease of native coronary artery without angina pectoris: Secondary | ICD-10-CM | POA: Diagnosis not present

## 2015-11-09 DIAGNOSIS — M4807 Spinal stenosis, lumbosacral region: Secondary | ICD-10-CM | POA: Diagnosis not present

## 2015-11-09 DIAGNOSIS — Z951 Presence of aortocoronary bypass graft: Secondary | ICD-10-CM | POA: Diagnosis not present

## 2015-11-09 DIAGNOSIS — Z9181 History of falling: Secondary | ICD-10-CM | POA: Diagnosis not present

## 2015-11-09 DIAGNOSIS — M1611 Unilateral primary osteoarthritis, right hip: Secondary | ICD-10-CM | POA: Diagnosis not present

## 2015-11-11 ENCOUNTER — Other Ambulatory Visit (INDEPENDENT_AMBULATORY_CARE_PROVIDER_SITE_OTHER): Payer: Medicare Other

## 2015-11-11 DIAGNOSIS — E785 Hyperlipidemia, unspecified: Secondary | ICD-10-CM

## 2015-11-11 DIAGNOSIS — R7989 Other specified abnormal findings of blood chemistry: Secondary | ICD-10-CM

## 2015-11-11 DIAGNOSIS — D649 Anemia, unspecified: Secondary | ICD-10-CM

## 2015-11-11 DIAGNOSIS — R945 Abnormal results of liver function studies: Secondary | ICD-10-CM

## 2015-11-11 LAB — LIPID PANEL
CHOL/HDL RATIO: 2
Cholesterol: 116 mg/dL (ref 0–200)
HDL: 49.5 mg/dL (ref >39.00)
LDL Cholesterol: 49 mg/dL (ref 0–99)
NONHDL: 66.15
TRIGLYCERIDES: 86 mg/dL (ref 0.0–149.0)
VLDL: 17.2 mg/dL (ref 0.0–40.0)

## 2015-11-11 LAB — HEPATIC FUNCTION PANEL
ALBUMIN: 3.4 g/dL — AB (ref 3.5–5.2)
ALK PHOS: 127 U/L — AB (ref 39–117)
ALT: 16 U/L (ref 0–35)
AST: 20 U/L (ref 0–37)
BILIRUBIN DIRECT: 0.1 mg/dL (ref 0.0–0.3)
TOTAL PROTEIN: 5.8 g/dL — AB (ref 6.0–8.3)
Total Bilirubin: 0.4 mg/dL (ref 0.2–1.2)

## 2015-11-11 LAB — CBC WITH DIFFERENTIAL/PLATELET
BASOS PCT: 0.4 % (ref 0.0–3.0)
Basophils Absolute: 0 10*3/uL (ref 0.0–0.1)
EOS ABS: 0.1 10*3/uL (ref 0.0–0.7)
EOS PCT: 1.2 % (ref 0.0–5.0)
HEMATOCRIT: 32.6 % — AB (ref 36.0–46.0)
Hemoglobin: 11.1 g/dL — ABNORMAL LOW (ref 12.0–15.0)
Lymphocytes Relative: 21.6 % (ref 12.0–46.0)
Lymphs Abs: 1.3 10*3/uL (ref 0.7–4.0)
MCHC: 34.2 g/dL (ref 30.0–36.0)
MCV: 88.1 fl (ref 78.0–100.0)
MONO ABS: 0.3 10*3/uL (ref 0.1–1.0)
Monocytes Relative: 5.4 % (ref 3.0–12.0)
NEUTROS ABS: 4.3 10*3/uL (ref 1.4–7.7)
Neutrophils Relative %: 71.4 % (ref 43.0–77.0)
PLATELETS: 264 10*3/uL (ref 150.0–400.0)
RBC: 3.7 Mil/uL — ABNORMAL LOW (ref 3.87–5.11)
RDW: 15.1 % (ref 11.5–15.5)
WBC: 6 10*3/uL (ref 4.0–10.5)

## 2015-11-11 LAB — IBC PANEL
Iron: 92 ug/dL (ref 42–145)
Saturation Ratios: 37.6 % (ref 20.0–50.0)
TRANSFERRIN: 175 mg/dL — AB (ref 212.0–360.0)

## 2015-11-11 LAB — FERRITIN: Ferritin: 37.5 ng/mL (ref 10.0–291.0)

## 2015-11-11 LAB — VITAMIN B12: Vitamin B-12: 374 pg/mL (ref 211–911)

## 2015-11-12 ENCOUNTER — Ambulatory Visit
Admission: RE | Admit: 2015-11-12 | Discharge: 2015-11-12 | Disposition: A | Discharge status: Home / Self Care | Payer: Medicare Other | Source: Ambulatory Visit | Attending: Internal Medicine | Admitting: Internal Medicine

## 2015-11-12 ENCOUNTER — Encounter: Payer: Self-pay | Admitting: *Deleted

## 2015-11-12 ENCOUNTER — Ambulatory Visit: Payer: Medicare Other

## 2015-11-12 DIAGNOSIS — N281 Cyst of kidney, acquired: Secondary | ICD-10-CM | POA: Insufficient documentation

## 2015-11-12 DIAGNOSIS — R7989 Other specified abnormal findings of blood chemistry: Secondary | ICD-10-CM | POA: Diagnosis not present

## 2015-11-12 DIAGNOSIS — R945 Abnormal results of liver function studies: Secondary | ICD-10-CM | POA: Diagnosis not present

## 2015-11-15 DIAGNOSIS — M4807 Spinal stenosis, lumbosacral region: Secondary | ICD-10-CM | POA: Diagnosis not present

## 2015-11-15 DIAGNOSIS — M5117 Intervertebral disc disorders with radiculopathy, lumbosacral region: Secondary | ICD-10-CM | POA: Diagnosis not present

## 2015-11-15 DIAGNOSIS — Z923 Personal history of irradiation: Secondary | ICD-10-CM | POA: Diagnosis not present

## 2015-11-15 DIAGNOSIS — S2231XD Fracture of one rib, right side, subsequent encounter for fracture with routine healing: Secondary | ICD-10-CM | POA: Diagnosis not present

## 2015-11-15 DIAGNOSIS — Z853 Personal history of malignant neoplasm of breast: Secondary | ICD-10-CM | POA: Diagnosis not present

## 2015-11-15 DIAGNOSIS — W1830XD Fall on same level, unspecified, subsequent encounter: Secondary | ICD-10-CM | POA: Diagnosis not present

## 2015-11-15 DIAGNOSIS — M1611 Unilateral primary osteoarthritis, right hip: Secondary | ICD-10-CM | POA: Diagnosis not present

## 2015-11-15 DIAGNOSIS — M8448XD Pathological fracture, other site, subsequent encounter for fracture with routine healing: Secondary | ICD-10-CM | POA: Diagnosis not present

## 2015-11-15 DIAGNOSIS — Z9181 History of falling: Secondary | ICD-10-CM | POA: Diagnosis not present

## 2015-11-15 DIAGNOSIS — I10 Essential (primary) hypertension: Secondary | ICD-10-CM | POA: Diagnosis not present

## 2015-11-15 DIAGNOSIS — I2510 Atherosclerotic heart disease of native coronary artery without angina pectoris: Secondary | ICD-10-CM | POA: Diagnosis not present

## 2015-11-15 DIAGNOSIS — Z951 Presence of aortocoronary bypass graft: Secondary | ICD-10-CM | POA: Diagnosis not present

## 2015-11-17 DIAGNOSIS — Z951 Presence of aortocoronary bypass graft: Secondary | ICD-10-CM | POA: Diagnosis not present

## 2015-11-17 DIAGNOSIS — M8448XD Pathological fracture, other site, subsequent encounter for fracture with routine healing: Secondary | ICD-10-CM | POA: Diagnosis not present

## 2015-11-17 DIAGNOSIS — M5117 Intervertebral disc disorders with radiculopathy, lumbosacral region: Secondary | ICD-10-CM | POA: Diagnosis not present

## 2015-11-17 DIAGNOSIS — W1830XD Fall on same level, unspecified, subsequent encounter: Secondary | ICD-10-CM | POA: Diagnosis not present

## 2015-11-17 DIAGNOSIS — I10 Essential (primary) hypertension: Secondary | ICD-10-CM | POA: Diagnosis not present

## 2015-11-17 DIAGNOSIS — M1611 Unilateral primary osteoarthritis, right hip: Secondary | ICD-10-CM | POA: Diagnosis not present

## 2015-11-17 DIAGNOSIS — S2231XD Fracture of one rib, right side, subsequent encounter for fracture with routine healing: Secondary | ICD-10-CM | POA: Diagnosis not present

## 2015-11-17 DIAGNOSIS — Z923 Personal history of irradiation: Secondary | ICD-10-CM | POA: Diagnosis not present

## 2015-11-17 DIAGNOSIS — I2510 Atherosclerotic heart disease of native coronary artery without angina pectoris: Secondary | ICD-10-CM | POA: Diagnosis not present

## 2015-11-17 DIAGNOSIS — M4807 Spinal stenosis, lumbosacral region: Secondary | ICD-10-CM | POA: Diagnosis not present

## 2015-11-17 DIAGNOSIS — Z853 Personal history of malignant neoplasm of breast: Secondary | ICD-10-CM | POA: Diagnosis not present

## 2015-11-17 DIAGNOSIS — Z9181 History of falling: Secondary | ICD-10-CM | POA: Diagnosis not present

## 2015-11-22 ENCOUNTER — Ambulatory Visit: Payer: Medicare Other | Admitting: Internal Medicine

## 2015-11-22 DIAGNOSIS — Z923 Personal history of irradiation: Secondary | ICD-10-CM | POA: Diagnosis not present

## 2015-11-22 DIAGNOSIS — S2231XD Fracture of one rib, right side, subsequent encounter for fracture with routine healing: Secondary | ICD-10-CM | POA: Diagnosis not present

## 2015-11-22 DIAGNOSIS — I10 Essential (primary) hypertension: Secondary | ICD-10-CM | POA: Diagnosis not present

## 2015-11-22 DIAGNOSIS — Z951 Presence of aortocoronary bypass graft: Secondary | ICD-10-CM | POA: Diagnosis not present

## 2015-11-22 DIAGNOSIS — Z853 Personal history of malignant neoplasm of breast: Secondary | ICD-10-CM | POA: Diagnosis not present

## 2015-11-22 DIAGNOSIS — M4807 Spinal stenosis, lumbosacral region: Secondary | ICD-10-CM | POA: Diagnosis not present

## 2015-11-22 DIAGNOSIS — M5117 Intervertebral disc disorders with radiculopathy, lumbosacral region: Secondary | ICD-10-CM | POA: Diagnosis not present

## 2015-11-22 DIAGNOSIS — I2510 Atherosclerotic heart disease of native coronary artery without angina pectoris: Secondary | ICD-10-CM | POA: Diagnosis not present

## 2015-11-22 DIAGNOSIS — W1830XD Fall on same level, unspecified, subsequent encounter: Secondary | ICD-10-CM | POA: Diagnosis not present

## 2015-11-22 DIAGNOSIS — Z9181 History of falling: Secondary | ICD-10-CM | POA: Diagnosis not present

## 2015-11-22 DIAGNOSIS — M8448XD Pathological fracture, other site, subsequent encounter for fracture with routine healing: Secondary | ICD-10-CM | POA: Diagnosis not present

## 2015-11-22 DIAGNOSIS — M1611 Unilateral primary osteoarthritis, right hip: Secondary | ICD-10-CM | POA: Diagnosis not present

## 2015-11-23 DIAGNOSIS — M5117 Intervertebral disc disorders with radiculopathy, lumbosacral region: Secondary | ICD-10-CM | POA: Diagnosis not present

## 2015-11-23 DIAGNOSIS — Z853 Personal history of malignant neoplasm of breast: Secondary | ICD-10-CM | POA: Diagnosis not present

## 2015-11-23 DIAGNOSIS — M4807 Spinal stenosis, lumbosacral region: Secondary | ICD-10-CM | POA: Diagnosis not present

## 2015-11-23 DIAGNOSIS — Z951 Presence of aortocoronary bypass graft: Secondary | ICD-10-CM | POA: Diagnosis not present

## 2015-11-23 DIAGNOSIS — W1830XD Fall on same level, unspecified, subsequent encounter: Secondary | ICD-10-CM | POA: Diagnosis not present

## 2015-11-23 DIAGNOSIS — I2510 Atherosclerotic heart disease of native coronary artery without angina pectoris: Secondary | ICD-10-CM | POA: Diagnosis not present

## 2015-11-23 DIAGNOSIS — M1611 Unilateral primary osteoarthritis, right hip: Secondary | ICD-10-CM | POA: Diagnosis not present

## 2015-11-23 DIAGNOSIS — M8448XD Pathological fracture, other site, subsequent encounter for fracture with routine healing: Secondary | ICD-10-CM | POA: Diagnosis not present

## 2015-11-23 DIAGNOSIS — I10 Essential (primary) hypertension: Secondary | ICD-10-CM | POA: Diagnosis not present

## 2015-11-23 DIAGNOSIS — S2231XD Fracture of one rib, right side, subsequent encounter for fracture with routine healing: Secondary | ICD-10-CM | POA: Diagnosis not present

## 2015-11-23 DIAGNOSIS — Z9181 History of falling: Secondary | ICD-10-CM | POA: Diagnosis not present

## 2015-11-23 DIAGNOSIS — Z923 Personal history of irradiation: Secondary | ICD-10-CM | POA: Diagnosis not present

## 2015-11-25 DIAGNOSIS — I2510 Atherosclerotic heart disease of native coronary artery without angina pectoris: Secondary | ICD-10-CM | POA: Diagnosis not present

## 2015-11-25 DIAGNOSIS — M8448XD Pathological fracture, other site, subsequent encounter for fracture with routine healing: Secondary | ICD-10-CM | POA: Diagnosis not present

## 2015-11-25 DIAGNOSIS — M1611 Unilateral primary osteoarthritis, right hip: Secondary | ICD-10-CM | POA: Diagnosis not present

## 2015-11-25 DIAGNOSIS — Z9181 History of falling: Secondary | ICD-10-CM | POA: Diagnosis not present

## 2015-11-25 DIAGNOSIS — M4807 Spinal stenosis, lumbosacral region: Secondary | ICD-10-CM | POA: Diagnosis not present

## 2015-11-25 DIAGNOSIS — Z951 Presence of aortocoronary bypass graft: Secondary | ICD-10-CM | POA: Diagnosis not present

## 2015-11-25 DIAGNOSIS — Z853 Personal history of malignant neoplasm of breast: Secondary | ICD-10-CM | POA: Diagnosis not present

## 2015-11-25 DIAGNOSIS — S2231XD Fracture of one rib, right side, subsequent encounter for fracture with routine healing: Secondary | ICD-10-CM | POA: Diagnosis not present

## 2015-11-25 DIAGNOSIS — W1830XD Fall on same level, unspecified, subsequent encounter: Secondary | ICD-10-CM | POA: Diagnosis not present

## 2015-11-25 DIAGNOSIS — Z923 Personal history of irradiation: Secondary | ICD-10-CM | POA: Diagnosis not present

## 2015-11-25 DIAGNOSIS — M5117 Intervertebral disc disorders with radiculopathy, lumbosacral region: Secondary | ICD-10-CM | POA: Diagnosis not present

## 2015-11-25 DIAGNOSIS — I10 Essential (primary) hypertension: Secondary | ICD-10-CM | POA: Diagnosis not present

## 2015-11-26 ENCOUNTER — Telehealth: Payer: Self-pay

## 2015-11-26 NOTE — Telephone Encounter (Signed)
I must have been on the wrong date some how. I don't see an appt either.

## 2015-11-26 NOTE — Telephone Encounter (Signed)
Is this the right pt.  She just had labs and there is no appt scheduled that I see.

## 2015-11-26 NOTE — Telephone Encounter (Signed)
Pt coming for fasting labs 11/29/15. Please place future orders. Thank you.

## 2015-11-29 ENCOUNTER — Other Ambulatory Visit: Payer: Medicare Other

## 2015-11-30 DIAGNOSIS — W1830XD Fall on same level, unspecified, subsequent encounter: Secondary | ICD-10-CM | POA: Diagnosis not present

## 2015-11-30 DIAGNOSIS — S2231XD Fracture of one rib, right side, subsequent encounter for fracture with routine healing: Secondary | ICD-10-CM | POA: Diagnosis not present

## 2015-11-30 DIAGNOSIS — M5117 Intervertebral disc disorders with radiculopathy, lumbosacral region: Secondary | ICD-10-CM | POA: Diagnosis not present

## 2015-11-30 DIAGNOSIS — Z853 Personal history of malignant neoplasm of breast: Secondary | ICD-10-CM | POA: Diagnosis not present

## 2015-11-30 DIAGNOSIS — M8448XD Pathological fracture, other site, subsequent encounter for fracture with routine healing: Secondary | ICD-10-CM | POA: Diagnosis not present

## 2015-11-30 DIAGNOSIS — I10 Essential (primary) hypertension: Secondary | ICD-10-CM | POA: Diagnosis not present

## 2015-11-30 DIAGNOSIS — M1611 Unilateral primary osteoarthritis, right hip: Secondary | ICD-10-CM | POA: Diagnosis not present

## 2015-11-30 DIAGNOSIS — M4807 Spinal stenosis, lumbosacral region: Secondary | ICD-10-CM | POA: Diagnosis not present

## 2015-11-30 DIAGNOSIS — Z9181 History of falling: Secondary | ICD-10-CM | POA: Diagnosis not present

## 2015-11-30 DIAGNOSIS — I2510 Atherosclerotic heart disease of native coronary artery without angina pectoris: Secondary | ICD-10-CM | POA: Diagnosis not present

## 2015-11-30 DIAGNOSIS — Z951 Presence of aortocoronary bypass graft: Secondary | ICD-10-CM | POA: Diagnosis not present

## 2015-11-30 DIAGNOSIS — Z923 Personal history of irradiation: Secondary | ICD-10-CM | POA: Diagnosis not present

## 2015-12-02 ENCOUNTER — Ambulatory Visit: Payer: Medicare Other | Admitting: Internal Medicine

## 2015-12-02 ENCOUNTER — Telehealth: Payer: Self-pay | Admitting: Internal Medicine

## 2015-12-02 NOTE — Telephone Encounter (Signed)
FYI, Pt daughter called stating that pt was still in the bed when she went to get her ready for her appt.. Appt was resch. Let me know if the appt needs to be cancelled? Thank you!

## 2015-12-02 NOTE — Telephone Encounter (Signed)
Ok   Done  Thank you

## 2015-12-02 NOTE — Telephone Encounter (Signed)
Ok to take off schedule 

## 2015-12-07 DIAGNOSIS — Z9181 History of falling: Secondary | ICD-10-CM | POA: Diagnosis not present

## 2015-12-07 DIAGNOSIS — Z951 Presence of aortocoronary bypass graft: Secondary | ICD-10-CM | POA: Diagnosis not present

## 2015-12-07 DIAGNOSIS — S2231XD Fracture of one rib, right side, subsequent encounter for fracture with routine healing: Secondary | ICD-10-CM | POA: Diagnosis not present

## 2015-12-07 DIAGNOSIS — W1830XD Fall on same level, unspecified, subsequent encounter: Secondary | ICD-10-CM | POA: Diagnosis not present

## 2015-12-07 DIAGNOSIS — M8448XD Pathological fracture, other site, subsequent encounter for fracture with routine healing: Secondary | ICD-10-CM | POA: Diagnosis not present

## 2015-12-07 DIAGNOSIS — M5117 Intervertebral disc disorders with radiculopathy, lumbosacral region: Secondary | ICD-10-CM | POA: Diagnosis not present

## 2015-12-07 DIAGNOSIS — I2510 Atherosclerotic heart disease of native coronary artery without angina pectoris: Secondary | ICD-10-CM | POA: Diagnosis not present

## 2015-12-07 DIAGNOSIS — M4807 Spinal stenosis, lumbosacral region: Secondary | ICD-10-CM | POA: Diagnosis not present

## 2015-12-07 DIAGNOSIS — I10 Essential (primary) hypertension: Secondary | ICD-10-CM | POA: Diagnosis not present

## 2015-12-07 DIAGNOSIS — Z923 Personal history of irradiation: Secondary | ICD-10-CM | POA: Diagnosis not present

## 2015-12-07 DIAGNOSIS — M1611 Unilateral primary osteoarthritis, right hip: Secondary | ICD-10-CM | POA: Diagnosis not present

## 2015-12-07 DIAGNOSIS — Z853 Personal history of malignant neoplasm of breast: Secondary | ICD-10-CM | POA: Diagnosis not present

## 2015-12-08 DIAGNOSIS — Z9181 History of falling: Secondary | ICD-10-CM | POA: Diagnosis not present

## 2015-12-08 DIAGNOSIS — M8448XD Pathological fracture, other site, subsequent encounter for fracture with routine healing: Secondary | ICD-10-CM | POA: Diagnosis not present

## 2015-12-08 DIAGNOSIS — M1611 Unilateral primary osteoarthritis, right hip: Secondary | ICD-10-CM | POA: Diagnosis not present

## 2015-12-08 DIAGNOSIS — I10 Essential (primary) hypertension: Secondary | ICD-10-CM | POA: Diagnosis not present

## 2015-12-08 DIAGNOSIS — M5117 Intervertebral disc disorders with radiculopathy, lumbosacral region: Secondary | ICD-10-CM | POA: Diagnosis not present

## 2015-12-08 DIAGNOSIS — W1830XD Fall on same level, unspecified, subsequent encounter: Secondary | ICD-10-CM | POA: Diagnosis not present

## 2015-12-08 DIAGNOSIS — M4807 Spinal stenosis, lumbosacral region: Secondary | ICD-10-CM | POA: Diagnosis not present

## 2015-12-08 DIAGNOSIS — I2510 Atherosclerotic heart disease of native coronary artery without angina pectoris: Secondary | ICD-10-CM | POA: Diagnosis not present

## 2015-12-08 DIAGNOSIS — S2231XD Fracture of one rib, right side, subsequent encounter for fracture with routine healing: Secondary | ICD-10-CM | POA: Diagnosis not present

## 2015-12-08 DIAGNOSIS — Z951 Presence of aortocoronary bypass graft: Secondary | ICD-10-CM | POA: Diagnosis not present

## 2015-12-08 DIAGNOSIS — Z853 Personal history of malignant neoplasm of breast: Secondary | ICD-10-CM | POA: Diagnosis not present

## 2015-12-08 DIAGNOSIS — Z923 Personal history of irradiation: Secondary | ICD-10-CM | POA: Diagnosis not present

## 2015-12-09 DIAGNOSIS — I2510 Atherosclerotic heart disease of native coronary artery without angina pectoris: Secondary | ICD-10-CM | POA: Diagnosis not present

## 2015-12-09 DIAGNOSIS — Z923 Personal history of irradiation: Secondary | ICD-10-CM | POA: Diagnosis not present

## 2015-12-09 DIAGNOSIS — Z951 Presence of aortocoronary bypass graft: Secondary | ICD-10-CM | POA: Diagnosis not present

## 2015-12-09 DIAGNOSIS — M5117 Intervertebral disc disorders with radiculopathy, lumbosacral region: Secondary | ICD-10-CM | POA: Diagnosis not present

## 2015-12-09 DIAGNOSIS — M1611 Unilateral primary osteoarthritis, right hip: Secondary | ICD-10-CM | POA: Diagnosis not present

## 2015-12-09 DIAGNOSIS — Z853 Personal history of malignant neoplasm of breast: Secondary | ICD-10-CM | POA: Diagnosis not present

## 2015-12-09 DIAGNOSIS — I10 Essential (primary) hypertension: Secondary | ICD-10-CM | POA: Diagnosis not present

## 2015-12-09 DIAGNOSIS — M8448XD Pathological fracture, other site, subsequent encounter for fracture with routine healing: Secondary | ICD-10-CM | POA: Diagnosis not present

## 2015-12-09 DIAGNOSIS — Z9181 History of falling: Secondary | ICD-10-CM | POA: Diagnosis not present

## 2015-12-09 DIAGNOSIS — M4807 Spinal stenosis, lumbosacral region: Secondary | ICD-10-CM | POA: Diagnosis not present

## 2015-12-09 DIAGNOSIS — W1830XD Fall on same level, unspecified, subsequent encounter: Secondary | ICD-10-CM | POA: Diagnosis not present

## 2015-12-09 DIAGNOSIS — S2231XD Fracture of one rib, right side, subsequent encounter for fracture with routine healing: Secondary | ICD-10-CM | POA: Diagnosis not present

## 2015-12-14 DIAGNOSIS — M4807 Spinal stenosis, lumbosacral region: Secondary | ICD-10-CM | POA: Diagnosis not present

## 2015-12-14 DIAGNOSIS — Z853 Personal history of malignant neoplasm of breast: Secondary | ICD-10-CM | POA: Diagnosis not present

## 2015-12-14 DIAGNOSIS — I2510 Atherosclerotic heart disease of native coronary artery without angina pectoris: Secondary | ICD-10-CM | POA: Diagnosis not present

## 2015-12-14 DIAGNOSIS — S2231XD Fracture of one rib, right side, subsequent encounter for fracture with routine healing: Secondary | ICD-10-CM | POA: Diagnosis not present

## 2015-12-14 DIAGNOSIS — Z9181 History of falling: Secondary | ICD-10-CM | POA: Diagnosis not present

## 2015-12-14 DIAGNOSIS — W1830XD Fall on same level, unspecified, subsequent encounter: Secondary | ICD-10-CM | POA: Diagnosis not present

## 2015-12-14 DIAGNOSIS — Z923 Personal history of irradiation: Secondary | ICD-10-CM | POA: Diagnosis not present

## 2015-12-14 DIAGNOSIS — M5117 Intervertebral disc disorders with radiculopathy, lumbosacral region: Secondary | ICD-10-CM | POA: Diagnosis not present

## 2015-12-14 DIAGNOSIS — I10 Essential (primary) hypertension: Secondary | ICD-10-CM | POA: Diagnosis not present

## 2015-12-14 DIAGNOSIS — M8448XD Pathological fracture, other site, subsequent encounter for fracture with routine healing: Secondary | ICD-10-CM | POA: Diagnosis not present

## 2015-12-14 DIAGNOSIS — M1611 Unilateral primary osteoarthritis, right hip: Secondary | ICD-10-CM | POA: Diagnosis not present

## 2015-12-14 DIAGNOSIS — Z951 Presence of aortocoronary bypass graft: Secondary | ICD-10-CM | POA: Diagnosis not present

## 2015-12-17 DIAGNOSIS — M4807 Spinal stenosis, lumbosacral region: Secondary | ICD-10-CM | POA: Diagnosis not present

## 2015-12-17 DIAGNOSIS — Z923 Personal history of irradiation: Secondary | ICD-10-CM | POA: Diagnosis not present

## 2015-12-17 DIAGNOSIS — Z9181 History of falling: Secondary | ICD-10-CM | POA: Diagnosis not present

## 2015-12-17 DIAGNOSIS — Z951 Presence of aortocoronary bypass graft: Secondary | ICD-10-CM | POA: Diagnosis not present

## 2015-12-17 DIAGNOSIS — S2231XD Fracture of one rib, right side, subsequent encounter for fracture with routine healing: Secondary | ICD-10-CM | POA: Diagnosis not present

## 2015-12-17 DIAGNOSIS — W1830XD Fall on same level, unspecified, subsequent encounter: Secondary | ICD-10-CM | POA: Diagnosis not present

## 2015-12-17 DIAGNOSIS — Z853 Personal history of malignant neoplasm of breast: Secondary | ICD-10-CM | POA: Diagnosis not present

## 2015-12-17 DIAGNOSIS — I10 Essential (primary) hypertension: Secondary | ICD-10-CM | POA: Diagnosis not present

## 2015-12-17 DIAGNOSIS — I2510 Atherosclerotic heart disease of native coronary artery without angina pectoris: Secondary | ICD-10-CM | POA: Diagnosis not present

## 2015-12-17 DIAGNOSIS — M1611 Unilateral primary osteoarthritis, right hip: Secondary | ICD-10-CM | POA: Diagnosis not present

## 2015-12-17 DIAGNOSIS — M8448XD Pathological fracture, other site, subsequent encounter for fracture with routine healing: Secondary | ICD-10-CM | POA: Diagnosis not present

## 2015-12-17 DIAGNOSIS — M5117 Intervertebral disc disorders with radiculopathy, lumbosacral region: Secondary | ICD-10-CM | POA: Diagnosis not present

## 2015-12-30 ENCOUNTER — Other Ambulatory Visit: Payer: Self-pay | Admitting: Internal Medicine

## 2015-12-30 DIAGNOSIS — Z1231 Encounter for screening mammogram for malignant neoplasm of breast: Secondary | ICD-10-CM

## 2016-01-04 DIAGNOSIS — M5136 Other intervertebral disc degeneration, lumbar region: Secondary | ICD-10-CM | POA: Diagnosis not present

## 2016-01-04 DIAGNOSIS — M4806 Spinal stenosis, lumbar region: Secondary | ICD-10-CM | POA: Diagnosis not present

## 2016-01-07 ENCOUNTER — Emergency Department
Admission: EM | Admit: 2016-01-07 | Discharge: 2016-01-07 | Disposition: A | Discharge status: Home / Self Care | Payer: Medicare Other | Attending: Emergency Medicine | Admitting: Emergency Medicine

## 2016-01-07 ENCOUNTER — Telehealth: Payer: Self-pay | Admitting: *Deleted

## 2016-01-07 ENCOUNTER — Emergency Department: Payer: Medicare Other

## 2016-01-07 ENCOUNTER — Telehealth: Payer: Self-pay | Admitting: Internal Medicine

## 2016-01-07 DIAGNOSIS — S3993XA Unspecified injury of pelvis, initial encounter: Secondary | ICD-10-CM | POA: Diagnosis not present

## 2016-01-07 DIAGNOSIS — Y9389 Activity, other specified: Secondary | ICD-10-CM | POA: Insufficient documentation

## 2016-01-07 DIAGNOSIS — Z853 Personal history of malignant neoplasm of breast: Secondary | ICD-10-CM | POA: Diagnosis not present

## 2016-01-07 DIAGNOSIS — I2510 Atherosclerotic heart disease of native coronary artery without angina pectoris: Secondary | ICD-10-CM | POA: Insufficient documentation

## 2016-01-07 DIAGNOSIS — M549 Dorsalgia, unspecified: Secondary | ICD-10-CM | POA: Diagnosis present

## 2016-01-07 DIAGNOSIS — Z79899 Other long term (current) drug therapy: Secondary | ICD-10-CM | POA: Insufficient documentation

## 2016-01-07 DIAGNOSIS — W1839XA Other fall on same level, initial encounter: Secondary | ICD-10-CM | POA: Insufficient documentation

## 2016-01-07 DIAGNOSIS — Y999 Unspecified external cause status: Secondary | ICD-10-CM | POA: Diagnosis not present

## 2016-01-07 DIAGNOSIS — E039 Hypothyroidism, unspecified: Secondary | ICD-10-CM | POA: Diagnosis not present

## 2016-01-07 DIAGNOSIS — W19XXXA Unspecified fall, initial encounter: Secondary | ICD-10-CM

## 2016-01-07 DIAGNOSIS — Y92019 Unspecified place in single-family (private) house as the place of occurrence of the external cause: Secondary | ICD-10-CM | POA: Insufficient documentation

## 2016-01-07 DIAGNOSIS — S300XXA Contusion of lower back and pelvis, initial encounter: Secondary | ICD-10-CM | POA: Diagnosis not present

## 2016-01-07 DIAGNOSIS — S22080A Wedge compression fracture of T11-T12 vertebra, initial encounter for closed fracture: Secondary | ICD-10-CM | POA: Diagnosis not present

## 2016-01-07 DIAGNOSIS — S3992XA Unspecified injury of lower back, initial encounter: Secondary | ICD-10-CM | POA: Diagnosis not present

## 2016-01-07 DIAGNOSIS — S20229A Contusion of unspecified back wall of thorax, initial encounter: Secondary | ICD-10-CM | POA: Diagnosis not present

## 2016-01-07 MED ORDER — CYCLOBENZAPRINE HCL 5 MG PO TABS: 5.0000 mg | ORAL_TABLET | Freq: Three times a day (TID) | ORAL | 0 refills | Status: DC | PRN

## 2016-01-07 MED ORDER — PREDNISONE 10 MG PO TABS: 10.0000 mg | ORAL_TABLET | ORAL | 0 refills | Status: DC

## 2016-01-07 NOTE — ED Triage Notes (Addendum)
Pt arrives to ER via POV after fall last night. Pt fell backwards from standing after missing the door knob. Pt was at home when fall occurred. Pt had help getting up. Pt denies LOC. C/o back pain, pt also has chronic back pain. Fentanyl patch on back at time of triage. No bruising present at time of triage. No loss in skin integrity. Pt not taking any blood thinners. Pt alert and oriented X4, active, cooperative, pt in NAD. RR even and unlabored, color WNL.

## 2016-01-07 NOTE — Telephone Encounter (Signed)
Patients daughter stated that patient has fallen late yesterday night.  Patient woke up this more and is having body aches all over.  She has been scheduled with Dr. Lacinda Axon on [id number] @[id number] .  Patients daughter did state that if SX worsen over the weekend patient will be taken to urgent care or ED.

## 2016-01-07 NOTE — ED Provider Notes (Signed)
[facility] Emergency Department Provider Note  ____________________________________________  Time seen: Approximately 2:59 PM  I have reviewed the triage vital signs and the nursing notes.   HISTORY  Chief Complaint Fall    HPI Angela Howe is a 82 y.o. female presents emergency department complaining of acute on chronic back pain. Patient states that she was at home trying to open her door when the door handle unattached from the physical door. Patient fell backwards landing on her back. Patient states that she did bump her head but did not lose consciousness. She is not on blood thinners. Patient denies any headache. She states that she had a bump on the back of her head that went down with ice. Patient denies any headache, skull pain, neck pain. Patient reports mid to lower back pain and right-sided hip pain. Patient is on chronic pain medication, she is using fentanyl patches. Patient reports that these patches to improve her back pain including the acute onset of back pain status post fall. However, patient states that it is not fully resolve symptoms. She denies any concerning symptoms of bowel or bladder dysfunction, saddle anesthesia, paresthesias. No other complaint at this time.   Past Medical History:  Diagnosis Date  . Breast cancer ([place])   . CAD (coronary artery disease)   . Degenerative disc disease   . GERD (gastroesophageal reflux disease)   . Hyperlipidemia    Previous intolerance of statins  . Hypothyroidism   . Orthostatic hypotension   . Osteoarthritis   . Peptic ulcer disease   . Syncope     Patient Active Problem List   Diagnosis Date Noted  . Chest pain 10/12/2015  . Community acquired pneumonia 10/12/2015  . Rib fracture 10/03/2015  . Right carotid bruit 08/01/2015  . Loss of weight 01/12/2015  . Anemia 07/18/2014  . Health care maintenance 07/18/2014  . Hoarseness 07/18/2014  . Abdominal pain 05/05/2014  . Back  pain 10/05/2013  . Knee pain 07/14/2013  . Oral pain 05/06/2013  . Itching 01/05/2013  . Breast cancer ([place]) 11/20/2012  . Syncope 11/20/2012  . Hypothyroidism 08/10/2012  . Difficulty sleeping 08/10/2012  . Dizziness 11/08/2010  . Hyperlipemia 03/23/2009  . HYPERTENSION, BENIGN 03/23/2009  . CAD, NATIVE VESSEL 09/24/2008  . ORTHOSTATIC HYPOTENSION 09/24/2008  . ABNORMAL EKG 09/24/2008    Past Surgical History:  Procedure Laterality Date  . ABDOMINAL HYSTERECTOMY  1970  . APPENDECTOMY    . BIOPSY BREAST  2014  . BREAST EXCISIONAL BIOPSY Left 2014   neg  . BREAST EXCISIONAL BIOPSY Left 2006   postive radation  . BREAST LUMPECTOMY  2006  . CHOLECYSTECTOMY    . CORONARY ARTERY BYPASS GRAFT  05/2006   x3  . NASAL SINUS SURGERY    . ROTATOR CUFF REPAIR      Prior to Admission medications   Medication Sig Start Date End Date Taking? Authorizing Provider  acetaminophen (TYLENOL) 650 MG CR tablet Take 650 mg by mouth every 8 (eight) hours as needed for pain.     Historical Provider, MD  amitriptyline (ELAVIL) 50 MG tablet Take 50 mg by mouth at bedtime.    Historical Provider, MD  aspirin EC 81 MG tablet Take 81 mg by mouth daily.    Historical Provider, MD  atorvastatin (LIPITOR) 20 MG tablet Take 20 mg by mouth at bedtime.    Historical Provider, MD  citalopram (CELEXA) 20 MG tablet Take 1 tablet by mouth  daily 10/05/15  Einar Pheasant, MD  cyclobenzaprine (FLEXERIL) 5 MG tablet Take 1 tablet (5 mg total) by mouth 3 (three) times daily as needed for muscle spasms. 01/07/16   Charline Bills Vertis Scheib, PA-C  fentaNYL (DURAGESIC - DOSED MCG/HR) 12 MCG/HR Place 1 patch (12.5 mcg total) onto the skin every 3 (three) days. 10/13/15   Loletha Grayer, MD  fludrocortisone (FLORINEF) 0.1 MG tablet Take 1 tablet (0.1 mg total) by mouth daily. 03/22/15   Einar Pheasant, MD  lansoprazole (PREVACID) 30 MG capsule Take 30 mg by mouth daily.      Historical Provider, MD  levothyroxine (SYNTHROID,  LEVOTHROID) 50 MCG tablet Take 1 tablet by mouth  daily 10/05/15   Einar Pheasant, MD  nitroGLYCERIN (NITROSTAT) 0.4 MG SL tablet Place 1 tablet (0.4 mg total) under the tongue every 5 (five) minutes as needed for chest pain. 07/10/14   Einar Pheasant, MD  oxyCODONE-acetaminophen (PERCOCET/ROXICET) 5-325 MG tablet Take 1 tablet by mouth every 6 (six) hours as needed. 10/13/15   Loletha Grayer, MD  predniSONE (DELTASONE) 10 MG tablet Take 1 tablet (10 mg total) by mouth as directed. 01/07/16   Charline Bills Sayana Salley, PA-C    Allergies Alendronate sodium and Prednisone  Family History  Problem Relation Age of Onset  . Heart attack Sister   . Pancreatic cancer Brother     Social History Social History  Substance Use Topics  . Smoking status: Never Smoker  . Smokeless tobacco: Never Used  . Alcohol use No     Review of Systems  Constitutional: No fever/chills Eyes: No visual changes Cardiovascular: no chest pain. Respiratory: no cough. No SOB. Genitourinary: Negative for dysuria. No hematuria Musculoskeletal: Negative for musculoskeletal pain. Skin: Negative for rash, abrasions, lacerations, ecchymosis. Neurological: Negative for headaches, focal weakness or numbness. 10-point ROS otherwise negative.  ____________________________________________   PHYSICAL EXAM:  VITAL SIGNS: ED Triage Vitals [01/07/16 1315]  Enc Vitals Group     BP 119/62     Pulse Rate 83     Resp 18     Temp 97.8 F (36.6 C)     Temp Source Oral     SpO2 100 %     Weight 118 lb (53.5 kg)     Height 5\' 3"  (1.6 m)     Head Circumference      Peak Flow      Pain Score 10     Pain Loc      Pain Edu?      Excl. in [place]?      Constitutional: Alert and oriented. Well appearing and in no acute distress. Eyes: Conjunctivae are normal. PERRL. EOMI. Head: Atraumatic.No ecchymosis, contusion, abrasion, laceration. No tenderness to palpation over the osseous structures of the skull. No palpable abnormality. No  crepitus. No battle signs. No raccoon eyes. The serosanguineous fluid drainage from ears or nares. Neck: No stridor.  No cervical spine tenderness to palpation.  Cardiovascular: Normal rate, regular rhythm. Normal S1 and S2.  Good peripheral circulation. Respiratory: Normal respiratory effort without tachypnea or retractions. Lungs CTAB. Good air entry to the bases with no decreased or absent breath sounds. Musculoskeletal: Full range of motion to all extremities. No gross deformities appreciated. No deformities noted to spine. No ecchymosis, contusion, abrasion, lacerations noted. The patient is tender to palpation midline and paraspinal muscle region from mid thoracic all the way to pelvis. No specific point tenderness. No palpable abnormality. No tenderness to palpation over bilateral sciatic notches. Dorsalis pedis pulse intact bilateral lower extremities.  Sensation intact and equal lower extremities. Neurologic:  Normal speech and language. No gross focal neurologic deficits are appreciated.  Skin:  Skin is warm, dry and intact. No rash noted. Psychiatric: Mood and affect are normal. Speech and behavior are normal. Patient exhibits appropriate insight and judgement.   ____________________________________________   LABS (all labs ordered are listed, but only abnormal results are displayed)  Labs Reviewed - No data to display ____________________________________________  EKG   ____________________________________________  RADIOLOGY [facility] Raul Winterhalter, personally viewed and evaluated these images (plain radiographs) as part of my medical decision making, as well as reviewing the written report by the radiologist.  Dg Thoracic Spine 2 View  Result Date: 01/07/2016 CLINICAL DATA:  82 year old female with acute on chronic lumbar spine pain after falling earlier today EXAM: THORACIC SPINE 2 VIEWS COMPARISON:  Prior CT scan of the chest 10/12/2015 FINDINGS: Multiple compression fractures  again noted including T8, T9 and T11. The T8 and T11 fractures remain unchanged. However, there has been interval progression of height loss at T9. The bones are osteopenic in appearance. No other new compression fracture identified. Patient is status post median sternotomy with evidence of prior multivessel CABG. Aortic atherosclerosis again noted. Clips in the right upper quadrant suggest prior cholecystectomy. IMPRESSION: 1. Interval progression of height loss at T9 concerning for acute on chronic compression fracture. 2. T8 and T11 compression fractures remain unchanged. 3.  Aortic Atherosclerosis (ICD10-170.0) Electronically Signed   By: Jacqulynn Cadet M.D.   On: 01/07/2016 16:21   Dg Lumbar Spine Complete  Result Date: 01/07/2016 CLINICAL DATA:  Fall. EXAM: LUMBAR SPINE - COMPLETE 4+ VIEW COMPARISON:  10/01/2015 FINDINGS: There are treated compression fractures involving L3 and L4, unchanged from previous exam. T11 vertebral plana deformity is also again noted. No acute fracture or subluxation identified. The bones appear diffusely osteopenic. Aortic atherosclerosis noted. IMPRESSION: 1. No acute findings. 2. Chronic thoracic and lumbar compression deformities. Electronically Signed   By: Kerby Moors M.D.   On: 01/07/2016 16:19   Dg Pelvis 1-2 Views  Result Date: 01/07/2016 CLINICAL DATA:  82 y/o F; status post fall with a lower back pain around both sides of pelvis. EXAM: PELVIS - 1-2 VIEW COMPARISON:  10/01/2015 CT of abdomen and pelvis. FINDINGS: No acute fracture or dislocation is identified. Severe degenerative changes of the right hip joint with joint space narrowing and sclerotic/ fibrocystic changes of articular surfaces. Compression deformities and kyphoplasty of L3 and L4 again seen. IMPRESSION: No acute fracture or dislocation is identified. Electronically Signed   By: Kristine Garbe M.D.   On: 01/07/2016 16:20     ____________________________________________    PROCEDURES  Procedure(s) performed:    Procedures    Medications - No data to display   ____________________________________________   INITIAL IMPRESSION / ASSESSMENT AND PLAN / ED COURSE  Pertinent labs & imaging results that were available during my care of the patient were reviewed by me and considered in my medical decision making (see chart for details).  Review of the [place] CSRS was performed in accordance of the [place] prior to dispensing any controlled drugs.  Clinical Course    Patient's diagnosis is consistent with Fall resulting in back contusion. Exam is reassuring that imaging was undertaken. No acute osseous abnormality is identified. Chronic old injuries are identified. Patient is neurologically intact.. Patient will be discharged home with prescriptions for steroids and muscle relaxer. Patient states that she is allergic to prednisone but reaction is restlessness. Patient will be  prescribed steroids versus NSAIDs due to patient's age. Patient will be placed on both dose taper. Patient is also given a muscle relaxer with patient and family member cautioned on using same with fentanyl patch. Patient's family member will be present when using muscle relaxer. Patient is to follow up with primary care as needed or otherwise directed. Patient is given ED precautions to return to the ED for any worsening or new symptoms.     ____________________________________________  FINAL CLINICAL IMPRESSION(S) / ED DIAGNOSES  Final diagnoses:  Fall, initial encounter  Back contusion, unspecified laterality, initial encounter      NEW MEDICATIONS STARTED DURING THIS VISIT:  Discharge Medication List as of 01/07/2016  4:49 PM    START taking these medications   Details  cyclobenzaprine (FLEXERIL) 5 MG tablet Take 1 tablet (5 mg total) by mouth 3 (three) times daily as needed for muscle spasms., Starting Fri 01/07/2016, Print     predniSONE (DELTASONE) 10 MG tablet Take 1 tablet (10 mg total) by mouth as directed., Starting Fri 01/07/2016, Print            This chart was dictated using voice recognition software/Dragon. Despite best efforts to proofread, errors can occur which can change the meaning. Any change was purely unintentional.    Darletta Moll, PA-C 01/07/16 [place], MD 01/08/16 1520

## 2016-01-07 NOTE — Telephone Encounter (Signed)
Patient Name: Angela Howe  DOB: May 21, 1925    Initial Comment Caller states mother fell last night, sore all over. She hit the back of her head, and knot has since gone away.   Nurse Assessment  Nurse: Mallie Mussel, RN, Alveta Heimlich Date/Time Eilene Ghazi Time): 01/07/2016 11:06:43 AM  Confirm and document reason for call. If symptomatic, describe symptoms. You must click the next button to save text entered. ---Caller states that her mother fell last night and hit the back of her head. She denies cuts and bleeding. The knot at the impact is going down. Denies being knocked unconscious nor did she have a seizure. She is A&O. Denies vomiting. She is not with her mother at this time. Caller states that some other nurse had called and made her an appointment to be seen on Monday, nothing available today. I double checked this and there are no appointments available today. I offered to check [place], but she declined. She didn't think her mother would want to ride that far. She states that if she gets worse before Monday she will take her to an UC.  Has the patient traveled out of the country within the last 30 days? ---Not Applicable  Does the patient have any new or worsening symptoms? ---Yes  Will a triage be completed? ---No  Select reason for no triage. ---Other     Guidelines    Guideline Title Affirmed Question Affirmed Notes       Final Disposition User

## 2016-01-07 NOTE — Telephone Encounter (Signed)
Pt daughter requested pt be seen today, she had a fall last night. She hit her head, daughter report no pain to her head at this time. Pt is currently sore all over.  Please Contact [name] [phone] *transferred to the nurse line due to hitting head

## 2016-01-10 ENCOUNTER — Ambulatory Visit: Payer: Medicare Other | Admitting: Family Medicine

## 2016-01-10 NOTE — Telephone Encounter (Signed)
Noted.  Please hold until can speak to daughter.

## 2016-01-10 NOTE — Telephone Encounter (Signed)
Attempted to reach the patient at her home number, no answer, not able to leave a message. Attempted to reach the daughter Angela Howe and left a message to return my call for an update.  Patient was scheduled to see Dr. Lacinda Axon this am and cancelled the appt.

## 2016-01-10 NOTE — Telephone Encounter (Signed)
FYI

## 2016-01-10 NOTE — Telephone Encounter (Signed)
Please call for update on her condition.  If hit head and any persistent headache, dizziness, etc, needs evaluation today.  Any concerns - evaluation today.  Acute care or urgent care today.

## 2016-01-11 NOTE — Telephone Encounter (Signed)
Noted.  Ok.  Just wanted to make sure she was evaluated.

## 2016-01-11 NOTE — Telephone Encounter (Signed)
Left a VM again for daughter to return my call, per the chart looks like after her call to [place] she did take her mom to the ED, was given some muscle relaxers and steroid taper.  I will continue to try and reach her for a follow up.

## 2016-01-11 NOTE — Telephone Encounter (Signed)
Noted thanks °

## 2016-01-25 ENCOUNTER — Ambulatory Visit
Admission: RE | Admit: 2016-01-25 | Discharge: 2016-01-25 | Disposition: A | Discharge status: Home / Self Care | Payer: Medicare Other | Source: Ambulatory Visit | Attending: Internal Medicine | Admitting: Internal Medicine

## 2016-01-25 DIAGNOSIS — Z1231 Encounter for screening mammogram for malignant neoplasm of breast: Secondary | ICD-10-CM | POA: Diagnosis not present

## 2016-02-17 ENCOUNTER — Ambulatory Visit: Payer: Medicare Other | Admitting: Internal Medicine

## 2016-02-18 ENCOUNTER — Encounter: Payer: Self-pay | Admitting: Internal Medicine

## 2016-02-18 ENCOUNTER — Ambulatory Visit (INDEPENDENT_AMBULATORY_CARE_PROVIDER_SITE_OTHER): Payer: Medicare Other | Admitting: Internal Medicine

## 2016-02-18 VITALS — BP 132/68 | HR 89 | Temp 98.3°F | Ht 63.0 in | Wt 105.6 lb

## 2016-02-18 DIAGNOSIS — M25561 Pain in right knee: Secondary | ICD-10-CM

## 2016-02-18 DIAGNOSIS — M545 Low back pain, unspecified: Secondary | ICD-10-CM

## 2016-02-18 DIAGNOSIS — E785 Hyperlipidemia, unspecified: Secondary | ICD-10-CM

## 2016-02-18 DIAGNOSIS — D649 Anemia, unspecified: Secondary | ICD-10-CM | POA: Diagnosis not present

## 2016-02-18 DIAGNOSIS — G8929 Other chronic pain: Secondary | ICD-10-CM | POA: Diagnosis not present

## 2016-02-18 DIAGNOSIS — E039 Hypothyroidism, unspecified: Secondary | ICD-10-CM

## 2016-02-18 DIAGNOSIS — I2510 Atherosclerotic heart disease of native coronary artery without angina pectoris: Secondary | ICD-10-CM

## 2016-02-18 DIAGNOSIS — R634 Abnormal weight loss: Secondary | ICD-10-CM | POA: Diagnosis not present

## 2016-02-18 DIAGNOSIS — Z23 Encounter for immunization: Secondary | ICD-10-CM | POA: Diagnosis not present

## 2016-02-18 DIAGNOSIS — I10 Essential (primary) hypertension: Secondary | ICD-10-CM

## 2016-02-18 LAB — HEPATIC FUNCTION PANEL
ALT: 11 U/L (ref 0–35)
AST: 21 U/L (ref 0–37)
Albumin: 3.9 g/dL (ref 3.5–5.2)
Alkaline Phosphatase: 91 U/L (ref 39–117)
BILIRUBIN DIRECT: 0.1 mg/dL (ref 0.0–0.3)
BILIRUBIN TOTAL: 0.4 mg/dL (ref 0.2–1.2)
TOTAL PROTEIN: 6.7 g/dL (ref 6.0–8.3)

## 2016-02-18 LAB — BASIC METABOLIC PANEL
BUN: 25 mg/dL — AB (ref 6–23)
CHLORIDE: 108 meq/L (ref 96–112)
CO2: 29 mEq/L (ref 19–32)
Calcium: 9.4 mg/dL (ref 8.4–10.5)
Creatinine, Ser: 1.03 mg/dL (ref 0.40–1.20)
GFR: 53.74 mL/min — AB (ref >60.00)
Glucose, Bld: 97 mg/dL (ref 70–99)
POTASSIUM: 4.2 meq/L (ref 3.5–5.1)
SODIUM: 142 meq/L (ref 135–145)

## 2016-02-18 LAB — CBC WITH DIFFERENTIAL/PLATELET
BASOS PCT: 0.4 % (ref 0.0–3.0)
Basophils Absolute: 0 10*3/uL (ref 0.0–0.1)
EOS ABS: 0.1 10*3/uL (ref 0.0–0.7)
EOS PCT: 0.7 % (ref 0.0–5.0)
HEMATOCRIT: 36 % (ref 36.0–46.0)
HEMOGLOBIN: 12 g/dL (ref 12.0–15.0)
Lymphocytes Relative: 24.9 % (ref 12.0–46.0)
Lymphs Abs: 1.9 10*3/uL (ref 0.7–4.0)
MCHC: 33.4 g/dL (ref 30.0–36.0)
MCV: 89.8 fl (ref 78.0–100.0)
MONO ABS: 0.4 10*3/uL (ref 0.1–1.0)
Monocytes Relative: 6 % (ref 3.0–12.0)
NEUTROS ABS: 5.1 10*3/uL (ref 1.4–7.7)
Neutrophils Relative %: 68 % (ref 43.0–77.0)
PLATELETS: 262 10*3/uL (ref 150.0–400.0)
RBC: 4.01 Mil/uL (ref 3.87–5.11)
RDW: 15.4 % (ref 11.5–15.5)
WBC: 7.5 10*3/uL (ref 4.0–10.5)

## 2016-02-18 LAB — TSH: TSH: 4.56 u[IU]/mL — AB (ref 0.35–4.50)

## 2016-02-18 LAB — FERRITIN: FERRITIN: 39.7 ng/mL (ref 10.0–291.0)

## 2016-02-18 NOTE — Progress Notes (Signed)
Patient ID: Angela Howe, female   DOB: Apr 20, 1926, 82 y.o.   MRN: [id number]   Subjective:    Patient ID: Angela Howe, female    DOB: 08/12/1925, 82 y.o.   MRN: [id number]  HPI  Patient here for a scheduled follow up.  She was seen in the ER on 01/07/16 after a fall.  She lost grip while opening the door and fell backwards.  Went to ER because of increased back pain.  Note reviewed.  No head injury.  They gave her prednisone and muscle relaxer.  She still has back pain, but reports is similar to prior to the fall.  She does report increased pain in her right leg/knee.  Request earlier appt with Dr Sharlet Salina.  He follows her for her chronic pain.  She has lost weight.  She reports she is eating well.   Discussed the need for increased po intake.  No nausea or vomiting.  No bowel change.  Desires no further scanning at this time.     Past Medical History:  Diagnosis Date  . Breast cancer ([place])   . CAD (coronary artery disease)   . Degenerative disc disease   . GERD (gastroesophageal reflux disease)   . Hyperlipidemia    Previous intolerance of statins  . Hypothyroidism   . Orthostatic hypotension   . Osteoarthritis   . Peptic ulcer disease   . Syncope    Past Surgical History:  Procedure Laterality Date  . ABDOMINAL HYSTERECTOMY  1970  . APPENDECTOMY    . BIOPSY BREAST  2014  . BREAST EXCISIONAL BIOPSY Left 2014   neg  . BREAST EXCISIONAL BIOPSY Left 2006   postive radation  . BREAST LUMPECTOMY  2006  . CHOLECYSTECTOMY    . CORONARY ARTERY BYPASS GRAFT  05/2006   x3  . NASAL SINUS SURGERY    . ROTATOR CUFF REPAIR     Family History  Problem Relation Age of Onset  . Heart attack Sister   . Pancreatic cancer Brother    Social History   Social History  . Marital status: Widowed    Spouse name: N/A  . Number of children: N/A  . Years of education: N/A   Occupational History  . Retired Retired   Social History Main Topics  . Smoking status: Never Smoker  .  Smokeless tobacco: Never Used  . Alcohol use No  . Drug use: No  . Sexual activity: Not Asked   Other Topics Concern  . None   Social History Narrative   Widowed   Gets regular exercise    Outpatient Encounter Prescriptions as of 02/18/2016  Medication Sig  . acetaminophen (TYLENOL) 650 MG CR tablet Take 650 mg by mouth every 8 (eight) hours as needed for pain.   Marland Kitchen amitriptyline (ELAVIL) 50 MG tablet Take 50 mg by mouth at bedtime.  Marland Kitchen aspirin EC 81 MG tablet Take 81 mg by mouth daily.  Marland Kitchen atorvastatin (LIPITOR) 20 MG tablet Take 20 mg by mouth at bedtime.  . citalopram (CELEXA) 20 MG tablet Take 1 tablet by mouth  daily  . fentaNYL (DURAGESIC - DOSED MCG/HR) 12 MCG/HR Place 1 patch (12.5 mcg total) onto the skin every 3 (three) days.  . fludrocortisone (FLORINEF) 0.1 MG tablet Take 1 tablet (0.1 mg total) by mouth daily.  . lansoprazole (PREVACID) 30 MG capsule Take 30 mg by mouth daily.    Marland Kitchen levothyroxine (SYNTHROID, LEVOTHROID) 50 MCG tablet Take 1 tablet by mouth  daily  .  nitroGLYCERIN (NITROSTAT) 0.4 MG SL tablet Place 1 tablet (0.4 mg total) under the tongue every 5 (five) minutes as needed for chest pain.  . [DISCONTINUED] cyclobenzaprine (FLEXERIL) 5 MG tablet Take 1 tablet (5 mg total) by mouth 3 (three) times daily as needed for muscle spasms.  . [DISCONTINUED] oxyCODONE-acetaminophen (PERCOCET/ROXICET) 5-325 MG tablet Take 1 tablet by mouth every 6 (six) hours as needed.  . [DISCONTINUED] predniSONE (DELTASONE) 10 MG tablet Take 1 tablet (10 mg total) by mouth as directed.   No facility-administered encounter medications on file as of 02/18/2016.     Review of Systems  Constitutional: Positive for unexpected weight change. Negative for appetite change.  HENT: Negative for congestion and sinus pressure.   Respiratory: Negative for cough, chest tightness and shortness of breath.   Cardiovascular: Negative for chest pain, palpitations and leg swelling.  Gastrointestinal:  Negative for abdominal pain, diarrhea, nausea and vomiting.  Genitourinary: Negative for difficulty urinating and dysuria.  Musculoskeletal: Positive for back pain.       Right leg pain as outlined.    Skin: Negative for color change and rash.  Neurological: Negative for dizziness, light-headedness and headaches.  Psychiatric/Behavioral: Negative for agitation and dysphoric mood.       Objective:     Blood pressure rechecked by me:  132/68  Physical Exam  Constitutional: No distress.  HENT:  Nose: Nose normal.  Mouth/Throat: Oropharynx is clear and moist.  Neck: Neck supple. No thyromegaly present.  Cardiovascular: Normal rate and regular rhythm.   Pulmonary/Chest: Breath sounds normal. No respiratory distress. She has no wheezes.  Abdominal: Soft. Bowel sounds are normal. There is no tenderness.  Musculoskeletal: She exhibits no edema or tenderness.  Lymphadenopathy:    She has no cervical adenopathy.  Skin: No rash noted. No erythema.  Psychiatric: She has a normal mood and affect. Her behavior is normal.    BP 132/68   Pulse 89   Temp 98.3 F (36.8 C) (Oral)   Ht 5' 3"  (1.6 m)   Wt 105 lb 9.6 oz (47.9 kg)   SpO2 98%   BMI 18.71 kg/m  Wt Readings from Last 3 Encounters:  02/18/16 105 lb 9.6 oz (47.9 kg)  01/07/16 118 lb (53.5 kg)  11/04/15 118 lb 4 oz (53.6 kg)     Lab Results  Component Value Date   WBC 7.5 02/18/2016   HGB 12.0 02/18/2016   HCT 36.0 02/18/2016   PLT 262.0 02/18/2016   GLUCOSE 97 02/18/2016   CHOL 116 11/11/2015   TRIG 86.0 11/11/2015   HDL 49.50 11/11/2015   LDLCALC 49 11/11/2015   ALT 11 02/18/2016   AST 21 02/18/2016   NA 142 02/18/2016   K 4.2 02/18/2016   CL 108 02/18/2016   CREATININE 1.03 02/18/2016   BUN 25 (H) 02/18/2016   CO2 29 02/18/2016   TSH 4.56 (H) 02/18/2016   INR 1.14 10/01/2015    Mm Screening Breast Tomo Bilateral  Result Date: 01/25/2016 CLINICAL DATA:  Screening. EXAM: 2D DIGITAL SCREENING BILATERAL  MAMMOGRAM WITH CAD AND ADJUNCT TOMO COMPARISON:  Previous exam(s). ACR Breast Density Category b: There are scattered areas of fibroglandular density. FINDINGS: There are no findings suspicious for malignancy. Stable postsurgical changes on the left. Images were processed with CAD. IMPRESSION: No mammographic evidence of malignancy. A result letter of this screening mammogram will be mailed directly to the patient. RECOMMENDATION: Screening mammogram in one year. (Code:SM-B-01Y) BI-RADS CATEGORY  2: Benign. Electronically Signed   By:  Lajean Manes M.D.   On: 01/25/2016 12:01       Assessment & Plan:   Problem List Items Addressed This Visit    Anemia   Relevant Orders   CBC with Differential/Platelet (Completed)   Protein Electrophoresis, (serum)   Ferritin (Completed)   Back pain    Back pain and right leg and knee pain.  Sees Dr Sharlet Salina for pain control.  Given increased pain, she request referral back to Dr Sharlet Salina for evaluation and question of need for other treatment recommendations.        Relevant Orders   Protein Electrophoresis, (serum)   Ambulatory referral to Orthopedic Surgery   CAD, NATIVE VESSEL    Followed by cardiology.  Currently without symptoms.        Hyperlipemia   Relevant Orders   Hepatic function panel (Completed)   HYPERTENSION, BENIGN   Relevant Orders   Basic metabolic panel (Completed)   Hypothyroidism   Relevant Orders   TSH (Completed)   Knee pain   Loss of weight - Primary    Discussed with her today.  Discussed the need for increased po intake.  Check cbc, met c and tsh.  Follow weight.  Get her back in soon to reassess.  Hold on further testing.         Other Visit Diagnoses    Encounter for immunization       Relevant Orders   CBC with Differential/Platelet (Completed)   Hepatic function panel (Completed)   Basic metabolic panel (Completed)   TSH (Completed)   Protein Electrophoresis, (serum)   Ferritin (Completed)   Flu vaccine HIGH  DOSE PF (Completed)       Einar Pheasant, MD

## 2016-02-18 NOTE — Progress Notes (Signed)
Pre visit review using our clinic review tool, if applicable. No additional management support is needed unless otherwise documented below in the visit note. 

## 2016-02-19 ENCOUNTER — Encounter: Payer: Self-pay | Admitting: Internal Medicine

## 2016-02-19 NOTE — Assessment & Plan Note (Signed)
Back pain and right leg and knee pain.  Sees Dr Sharlet Salina for pain control.  Given increased pain, she request referral back to Dr Sharlet Salina for evaluation and question of need for other treatment recommendations.

## 2016-02-19 NOTE — Assessment & Plan Note (Signed)
Followed by cardiology.  Currently without symptoms.

## 2016-02-19 NOTE — Assessment & Plan Note (Signed)
Discussed with her today.  Discussed the need for increased po intake.  Check cbc, met c and tsh.  Follow weight.  Get her back in soon to reassess.  Hold on further testing.

## 2016-02-22 LAB — PROTEIN ELECTROPHORESIS, SERUM
ALBUMIN ELP: 3.4 g/dL — AB (ref 3.8–4.8)
ALPHA-1-GLOBULIN: 0.3 g/dL (ref 0.2–0.3)
ALPHA-2-GLOBULIN: 0.8 g/dL (ref 0.5–0.9)
Beta 2: 0.5 g/dL (ref 0.2–0.5)
Beta Globulin: 0.4 g/dL (ref 0.4–0.6)
GAMMA GLOBULIN: 0.7 g/dL — AB (ref 0.8–1.7)
Total Protein, Serum Electrophoresis: 6.1 g/dL (ref 6.1–8.1)

## 2016-02-23 ENCOUNTER — Other Ambulatory Visit: Payer: Self-pay | Admitting: Internal Medicine

## 2016-02-23 DIAGNOSIS — R7989 Other specified abnormal findings of blood chemistry: Secondary | ICD-10-CM

## 2016-02-23 NOTE — Progress Notes (Signed)
Order placed for f/u tsh.  

## 2016-03-03 ENCOUNTER — Other Ambulatory Visit: Payer: Self-pay | Admitting: Internal Medicine

## 2016-03-14 DIAGNOSIS — H353132 Nonexudative age-related macular degeneration, bilateral, intermediate dry stage: Secondary | ICD-10-CM | POA: Diagnosis not present

## 2016-04-04 ENCOUNTER — Encounter: Payer: Self-pay | Admitting: Internal Medicine

## 2016-04-04 ENCOUNTER — Ambulatory Visit (INDEPENDENT_AMBULATORY_CARE_PROVIDER_SITE_OTHER): Payer: Medicare Other | Admitting: Internal Medicine

## 2016-04-04 ENCOUNTER — Other Ambulatory Visit (INDEPENDENT_AMBULATORY_CARE_PROVIDER_SITE_OTHER): Payer: Medicare Other

## 2016-04-04 DIAGNOSIS — E785 Hyperlipidemia, unspecified: Secondary | ICD-10-CM | POA: Diagnosis not present

## 2016-04-04 DIAGNOSIS — I2510 Atherosclerotic heart disease of native coronary artery without angina pectoris: Secondary | ICD-10-CM

## 2016-04-04 DIAGNOSIS — G8929 Other chronic pain: Secondary | ICD-10-CM

## 2016-04-04 DIAGNOSIS — R634 Abnormal weight loss: Secondary | ICD-10-CM | POA: Diagnosis not present

## 2016-04-04 DIAGNOSIS — M545 Low back pain: Secondary | ICD-10-CM

## 2016-04-04 DIAGNOSIS — C50919 Malignant neoplasm of unspecified site of unspecified female breast: Secondary | ICD-10-CM

## 2016-04-04 DIAGNOSIS — E039 Hypothyroidism, unspecified: Secondary | ICD-10-CM

## 2016-04-04 DIAGNOSIS — I10 Essential (primary) hypertension: Secondary | ICD-10-CM | POA: Diagnosis not present

## 2016-04-04 DIAGNOSIS — R7989 Other specified abnormal findings of blood chemistry: Secondary | ICD-10-CM

## 2016-04-04 DIAGNOSIS — R946 Abnormal results of thyroid function studies: Secondary | ICD-10-CM | POA: Diagnosis not present

## 2016-04-04 LAB — TSH: TSH: 3.07 u[IU]/mL (ref 0.35–4.50)

## 2016-04-04 NOTE — Progress Notes (Signed)
Pre visit review using our clinic review tool, if applicable. No additional management support is needed unless otherwise documented below in the visit note. 

## 2016-04-04 NOTE — Progress Notes (Signed)
Patient ID: Angela Howe, female   DOB: 29-May-1925, 82 y.o.   MRN: [id number]   Subjective:    Patient ID: Angela Howe, female    DOB: Dec 30, 1925, 81 y.o.   MRN: [id number]  HPI  Patient here for a scheduled follow up.  She reports she is doing relatively well.  Had DDD.  Seeing Dr Sharlet Salina.  Pain overall stable.  Eating.  No chest pain.  No sob.  No acid reflux.  No abdominal pain or cramping.  Bowels stable.     Past Medical History:  Diagnosis Date  . Breast cancer ([place])   . CAD (coronary artery disease)   . Degenerative disc disease   . GERD (gastroesophageal reflux disease)   . Hyperlipidemia    Previous intolerance of statins  . Hypothyroidism   . Orthostatic hypotension   . Osteoarthritis   . Peptic ulcer disease   . Syncope    Past Surgical History:  Procedure Laterality Date  . ABDOMINAL HYSTERECTOMY  1970  . APPENDECTOMY    . BIOPSY BREAST  2014  . BREAST EXCISIONAL BIOPSY Left 2014   neg  . BREAST EXCISIONAL BIOPSY Left 2006   postive radation  . BREAST LUMPECTOMY  2006  . CHOLECYSTECTOMY    . CORONARY ARTERY BYPASS GRAFT  05/2006   x3  . NASAL SINUS SURGERY    . ROTATOR CUFF REPAIR     Family History  Problem Relation Age of Onset  . Heart attack Sister   . Pancreatic cancer Brother    Social History   Social History  . Marital status: Widowed    Spouse name: N/A  . Number of children: N/A  . Years of education: N/A   Occupational History  . Retired Retired   Social History Main Topics  . Smoking status: Never Smoker  . Smokeless tobacco: Never Used  . Alcohol use No  . Drug use: No  . Sexual activity: Not Asked   Other Topics Concern  . None   Social History Narrative   Widowed   Gets regular exercise    Outpatient Encounter Prescriptions as of 04/04/2016  Medication Sig  . acetaminophen (TYLENOL) 650 MG CR tablet Take 650 mg by mouth every 8 (eight) hours as needed for pain.   Marland Kitchen amitriptyline (ELAVIL) 50 MG tablet Take  50 mg by mouth at bedtime.  Marland Kitchen aspirin EC 81 MG tablet Take 81 mg by mouth daily.  Marland Kitchen atorvastatin (LIPITOR) 20 MG tablet Take 20 mg by mouth at bedtime.  Marland Kitchen atorvastatin (LIPITOR) 20 MG tablet TAKE 1 TABLET BY MOUTH  DAILY  . citalopram (CELEXA) 20 MG tablet TAKE 1 TABLET BY MOUTH  DAILY  . fentaNYL (DURAGESIC - DOSED MCG/HR) 12 MCG/HR Place 1 patch (12.5 mcg total) onto the skin every 3 (three) days.  . fludrocortisone (FLORINEF) 0.1 MG tablet Take 1 tablet (0.1 mg total) by mouth daily.  . lansoprazole (PREVACID) 30 MG capsule Take 30 mg by mouth daily.    Marland Kitchen levothyroxine (SYNTHROID, LEVOTHROID) 50 MCG tablet TAKE 1 TABLET BY MOUTH  DAILY  . nitroGLYCERIN (NITROSTAT) 0.4 MG SL tablet Place 1 tablet (0.4 mg total) under the tongue every 5 (five) minutes as needed for chest pain.   No facility-administered encounter medications on file as of 04/04/2016.     Review of Systems  Constitutional: Negative for appetite change and unexpected weight change.  HENT: Negative for congestion and sinus pressure.   Respiratory: Negative for cough, chest tightness  and shortness of breath.   Cardiovascular: Negative for chest pain, palpitations and leg swelling.  Gastrointestinal: Negative for abdominal pain, diarrhea, nausea and vomiting.  Genitourinary: Negative for difficulty urinating and dysuria.  Musculoskeletal: Positive for back pain. Negative for joint swelling.  Skin: Negative for color change and rash.  Neurological: Negative for dizziness, light-headedness and headaches.  Psychiatric/Behavioral: Negative for agitation and dysphoric mood.       Objective:     Blood pressure rechecked by me:  144/78  Physical Exam  Constitutional: She appears well-developed and well-nourished. No distress.  HENT:  Nose: Nose normal.  Mouth/Throat: Oropharynx is clear and moist.  Neck: Neck supple. No thyromegaly present.  Cardiovascular: Normal rate and regular rhythm.   Pulmonary/Chest: Breath sounds  normal. No respiratory distress. She has no wheezes.  Abdominal: Soft. Bowel sounds are normal. There is no tenderness.  Musculoskeletal: She exhibits no edema or tenderness.  Lymphadenopathy:    She has no cervical adenopathy.  Skin: No rash noted. No erythema.  Psychiatric: She has a normal mood and affect. Her behavior is normal.    BP (!) 162/82   Pulse 81   Temp 98 F (36.7 C) (Oral)   Ht 5\' 3"  (1.6 m)   Wt 106 lb (48.1 kg)   SpO2 98%   BMI 18.78 kg/m  Wt Readings from Last 3 Encounters:  04/04/16 106 lb (48.1 kg)  02/18/16 105 lb 9.6 oz (47.9 kg)  01/07/16 118 lb (53.5 kg)     Lab Results  Component Value Date   WBC 7.5 02/18/2016   HGB 12.0 02/18/2016   HCT 36.0 02/18/2016   PLT 262.0 02/18/2016   GLUCOSE 97 02/18/2016   CHOL 116 11/11/2015   TRIG 86.0 11/11/2015   HDL 49.50 11/11/2015   LDLCALC 49 11/11/2015   ALT 11 02/18/2016   AST 21 02/18/2016   NA 142 02/18/2016   K 4.2 02/18/2016   CL 108 02/18/2016   CREATININE 1.03 02/18/2016   BUN 25 (H) 02/18/2016   CO2 29 02/18/2016   TSH 3.07 04/04/2016   INR 1.14 10/01/2015    Mm Screening Breast Tomo Bilateral  Result Date: 01/25/2016 CLINICAL DATA:  Screening. EXAM: 2D DIGITAL SCREENING BILATERAL MAMMOGRAM WITH CAD AND ADJUNCT TOMO COMPARISON:  Previous exam(s). ACR Breast Density Category b: There are scattered areas of fibroglandular density. FINDINGS: There are no findings suspicious for malignancy. Stable postsurgical changes on the left. Images were processed with CAD. IMPRESSION: No mammographic evidence of malignancy. A result letter of this screening mammogram will be mailed directly to the patient. RECOMMENDATION: Screening mammogram in one year. (Code:SM-B-01Y) BI-RADS CATEGORY  2: Benign. Electronically Signed   By: Lajean Manes M.D.   On: 01/25/2016 12:01       Assessment & Plan:   Problem List Items Addressed This Visit    Back pain    Chronic pain.  Sees Dr Sharlet Salina.  Controlled.         Breast cancer ([place])    Mammogram 01/25/16 - [facility] II.        CAD, NATIVE VESSEL    Followed by cardiology.  Continue risk factor modifications.  Currently asymptomatic.        Hyperlipemia    Low cholesterol diet and exercise.  On lipitor.  Follow lipid panel and liver function tests.        Relevant Orders   Lipid panel   Hepatic function panel   HYPERTENSION, BENIGN    Blood pressure has  been under reasonable control.  Continue same medication regimen.  Follow pressures.  Follow metabolic panel.        Relevant Orders   Basic metabolic panel   Hypothyroidism    On thyroid replacement.  Follow tsh.        Relevant Orders   TSH   Loss of weight    Weight stable from last weight.  Encourage increased po intake.  Follow.            Einar Pheasant, MD

## 2016-04-09 ENCOUNTER — Encounter: Payer: Self-pay | Admitting: Internal Medicine

## 2016-04-09 NOTE — Assessment & Plan Note (Signed)
Blood pressure has been under reasonable control.  Continue same medication regimen.  Follow pressures.  Follow metabolic panel.  

## 2016-04-09 NOTE — Assessment & Plan Note (Signed)
On thyroid replacement.  Follow tsh.  

## 2016-04-09 NOTE — Assessment & Plan Note (Signed)
Low cholesterol diet and exercise.  On lipitor.  Follow lipid panel and liver function tests.   

## 2016-04-09 NOTE — Assessment & Plan Note (Signed)
Chronic pain.  Sees Dr Sharlet Salina.  Controlled.

## 2016-04-09 NOTE — Assessment & Plan Note (Signed)
Mammogram 01/25/16 - [facility] II.

## 2016-04-09 NOTE — Assessment & Plan Note (Signed)
Weight stable from last weight.  Encourage increased po intake.  Follow.

## 2016-04-09 NOTE — Assessment & Plan Note (Signed)
Followed by cardiology.  Continue risk factor modifications.  Currently asymptomatic.

## 2016-05-22 DIAGNOSIS — M5416 Radiculopathy, lumbar region: Secondary | ICD-10-CM | POA: Diagnosis not present

## 2016-05-22 DIAGNOSIS — M48062 Spinal stenosis, lumbar region with neurogenic claudication: Secondary | ICD-10-CM | POA: Diagnosis not present

## 2016-05-22 DIAGNOSIS — M5136 Other intervertebral disc degeneration, lumbar region: Secondary | ICD-10-CM | POA: Diagnosis not present

## 2016-06-05 ENCOUNTER — Other Ambulatory Visit: Payer: Self-pay | Admitting: Internal Medicine

## 2016-07-04 ENCOUNTER — Ambulatory Visit (INDEPENDENT_AMBULATORY_CARE_PROVIDER_SITE_OTHER): Payer: Medicare Other | Admitting: Cardiovascular Disease

## 2016-07-04 ENCOUNTER — Encounter: Payer: Self-pay | Admitting: Cardiovascular Disease

## 2016-07-04 VITALS — BP 138/84 | HR 85 | Ht 63.0 in | Wt 103.0 lb

## 2016-07-04 DIAGNOSIS — I10 Essential (primary) hypertension: Secondary | ICD-10-CM

## 2016-07-04 DIAGNOSIS — R55 Syncope and collapse: Secondary | ICD-10-CM | POA: Diagnosis not present

## 2016-07-04 DIAGNOSIS — E785 Hyperlipidemia, unspecified: Secondary | ICD-10-CM | POA: Diagnosis not present

## 2016-07-04 DIAGNOSIS — R296 Repeated falls: Secondary | ICD-10-CM | POA: Diagnosis not present

## 2016-07-04 DIAGNOSIS — I2510 Atherosclerotic heart disease of native coronary artery without angina pectoris: Secondary | ICD-10-CM

## 2016-07-04 DIAGNOSIS — R079 Chest pain, unspecified: Secondary | ICD-10-CM

## 2016-07-04 NOTE — Progress Notes (Signed)
Cardiology Office Note  Date:  07/04/2016   ID:  Angela Howe, DOB [birth year], MRN [id number]  PCP:  Einar Pheasant, MD   Chief Complaint  Patient presents with  . other    21mo f/u. ED for fall 01/07/2016. Pt states she is doing well. Reviewed meds with pt verbally.    HPI:  Angela Howe is a very pleasant 83 year-old woman with history of coronary artery disease, status post bypass grafting in 2008. She also has a history of hypertension, hyperlipidemia, breast cancer status post left lumpectomy, and gastroesophageal reflux disease. Previous episodes of syncope  (dating back to 2012) felt secondary to orthostatic hypotension,maintained on Florinef  (midodrine was previously used  for continued orthostasis) -Previous cardiac catheterization showed patent LIMA graft, vein graft to the diagonal, occluded vein graft to the PDA though the native RCA was patent, good LV function. She presents for routine followup of her coronary artery disease and syncope Husband passed weight 15 years ago Chronic low back pain  She presents by herself today In follow-up today she reports that she is doing well except for worsening leg weakness Reports that she's had several falls 2 falls since [place] Reports that she is Falling forward, one fell 40 after getting out of her car Another fall after she was not able to hold onto a door handle, fell backwards Hurt her back, went to the walk in clinic, put ice on her head Right knee pain after a fall No near syncope or syncope  No regular exercise program, legs are getting weaker She does not like to use her walker  Previous episode of syncope after getting out of bed in the middle of the night with urinary urgency.  EKG reviewed personally by myself on today's visit shows normal sinus rhythm with no significant ST or T-wave changes, rate 85 bpm  Other past medical history reviewed Previous 30 day monitor  This shows normal sinus rhythm with no  significant arrhythmia  MRI reviewed with her showing moderate to severe spinal stenosis, compression fractures  several falls in 2016 but these were mechanical, not from syncope or near syncope she does endorse having  previous syncopal episodes, one occurred at [facility], 1 at [facility], one at [facility] Typically she had witnessed syncope, and on recovering, several minutes later would have nausea and vomiting after each episode  Orthostatics done in the office previously showed no signs of significant orthostasis 0000000 systolic supine, [id number] sitting, 143 standing, minimal change in heart rate, also with no symptoms  Prior episode of syncope. She was at [facility] in the checkout when she felt hot, sweaty then had loss of consciousness. She reports it was only for a short period of time. She also reported additional episodes of diaphoresis, feeling hot and sweaty with near syncope. These occurring over  several weeks. On a prior visit,, her blood pressure was 62/40. She was feeling hot, had to sit down . Recheck of her blood pressure was 82/40 on the right. Some time later recheck her blood pressure by myself was [id number] systolic and she was feeling better  History of fall  back x-rays Sep 24 2013 when she fell, repeat x-rays 10/02/2013. Most recent June x-ray suggesting moderate compression fracture of L3. Prior L4 kyphoplasty  History of chronic severe knee pain and neuropathy. She was seen by Dr. Jefm Bryant and told that she needed a hip replacement  Previous episode of chest pain at the end of August 2013. Stress test was ordered  to rule out ischemia. This showed anterior wall defect consistent with breast attenuation artifact, normal ejection fraction, unable to exclude mild apical ischemia  PMH:   has a past medical history of Breast cancer ([place]); CAD (coronary artery disease); Degenerative disc disease; GERD (gastroesophageal reflux disease); Hyperlipidemia; Hypothyroidism; Orthostatic hypotension;  Osteoarthritis; Peptic ulcer disease; and Syncope.  PSH:    Past Surgical History:  Procedure Laterality Date  . ABDOMINAL HYSTERECTOMY  1970  . APPENDECTOMY    . BIOPSY BREAST  2014  . BREAST EXCISIONAL BIOPSY Left 2014   neg  . BREAST EXCISIONAL BIOPSY Left 2006   postive radation  . BREAST LUMPECTOMY  2006  . CHOLECYSTECTOMY    . CORONARY ARTERY BYPASS GRAFT  05/2006   x3  . NASAL SINUS SURGERY    . ROTATOR CUFF REPAIR      Current Outpatient Prescriptions  Medication Sig Dispense Refill  . amitriptyline (ELAVIL) 50 MG tablet Take 50 mg by mouth at bedtime.    Marland Kitchen amitriptyline (ELAVIL) 50 MG tablet TAKE 1 TABLET BY MOUTH AT BEDTIME 90 tablet 0  . aspirin EC 81 MG tablet Take 81 mg by mouth daily.    Marland Kitchen atorvastatin (LIPITOR) 20 MG tablet TAKE 1 TABLET BY MOUTH  DAILY 90 tablet 1  . citalopram (CELEXA) 20 MG tablet TAKE 1 TABLET BY MOUTH  DAILY 90 tablet 1  . fentaNYL (DURAGESIC - DOSED MCG/HR) 12 MCG/HR Place 1 patch (12.5 mcg total) onto the skin every 3 (three) days. 5 patch 0  . fludrocortisone (FLORINEF) 0.1 MG tablet Take 1 tablet (0.1 mg total) by mouth daily. 90 tablet 3  . lansoprazole (PREVACID) 30 MG capsule Take 30 mg by mouth daily.      Marland Kitchen levothyroxine (SYNTHROID, LEVOTHROID) 50 MCG tablet TAKE 1 TABLET BY MOUTH  DAILY 90 tablet 1  . nitroGLYCERIN (NITROSTAT) 0.4 MG SL tablet Place 1 tablet (0.4 mg total) under the tongue every 5 (five) minutes as needed for chest pain. 25 tablet 0   No current facility-administered medications for this visit.      Allergies:   Alendronate sodium and Prednisone   Social History:  The patient  reports that she has never smoked. She has never used smokeless tobacco. She reports that she does not drink alcohol or use drugs.   Family History:   family history includes Cancer in her brother; Heart attack in her sister; Ovarian cancer in her sister; Pancreatic cancer in her brother.    Review of Systems: Review of Systems   Constitutional: Negative.   Respiratory: Negative.   Cardiovascular: Negative.   Gastrointestinal: Negative.   Musculoskeletal: Positive for falls.       Leg weakness, muscle wasting  Neurological: Negative.   Psychiatric/Behavioral: Negative.   All other systems reviewed and are negative.    PHYSICAL EXAM: VS:  BP 138/84 (BP Location: Left Arm, Patient Position: Sitting, Cuff Size: Normal)   Pulse 85   Ht 5\' 3"  (1.6 m)   Wt 103 lb (46.7 kg)   BMI 18.25 kg/m  , BMI Body mass index is 18.25 kg/m. GEN: Thin, frail, in no acute distress , Walks with a cane HEENT: normal  Neck: no JVD, carotid bruits, or masses Cardiac: RRR; no murmurs, rubs, or gallops,no edema  Respiratory:  clear to auscultation bilaterally, normal work of breathing GI: soft, nontender, nondistended, + BS MS: no deformity or atrophy  Skin: warm and dry, no rash Neuro:  Strength and sensation are intact Psych:  euthymic mood, full affect    Recent Labs: 02/18/2016: ALT 11; BUN 25; Creatinine, Ser 1.03; Hemoglobin 12.0; Platelets 262.0; Potassium 4.2; Sodium 142 04/04/2016: TSH 3.07    Lipid Panel Lab Results  Component Value Date   CHOL 116 11/11/2015   HDL 49.50 11/11/2015   LDLCALC 49 11/11/2015   TRIG 86.0 11/11/2015      Wt Readings from Last 3 Encounters:  07/04/16 103 lb (46.7 kg)  04/04/16 106 lb (48.1 kg)  02/18/16 105 lb 9.6 oz (47.9 kg)       ASSESSMENT AND PLAN:  Hyperlipidemia, unspecified hyperlipidemia type - Plan: EKG 12-Lead Cholesterol is at goal on the current lipid regimen. No changes to the medications were made.  HYPERTENSION, BENIGN - Plan: EKG 12-Lead Blood pressure is well controlled on today's visit. No changes made to the medications.  Atherosclerosis of native coronary artery of native heart without angina pectoris - Plan: EKG 12-Lead Currently with no symptoms of angina. No further workup at this time. Continue current medication regimen.  Syncope,  unspecified syncope type - Plan: EKG 12-Lead Denies any near-syncope or syncope No further workup needed  Chest pain, unspecified type - Plan: EKG 12-Lead Denies having any chest pain concerning for angina. No significant change in EKG  Falls frequently Long discussion concerning leg weakness, falls She does not like to use her walker, no exercises, legs are getting weaker She may benefit from home physical therapy   Total encounter time more than 25 minutes  Greater than 50% was spent in counseling and coordination of care with the patient   Disposition:   F/U  6 months   Orders Placed This Encounter  Procedures  . EKG 12-Lead     Signed, Esmond Plants, M.D., Ph.D. 07/04/2016  [place], [place]

## 2016-07-04 NOTE — Patient Instructions (Signed)

## 2016-08-08 ENCOUNTER — Encounter: Payer: Self-pay | Admitting: Internal Medicine

## 2016-08-08 ENCOUNTER — Ambulatory Visit (INDEPENDENT_AMBULATORY_CARE_PROVIDER_SITE_OTHER): Payer: Medicare Other | Admitting: Internal Medicine

## 2016-08-08 VITALS — BP 130/74 | HR 77 | Temp 97.8°F | Resp 12 | Ht 63.0 in | Wt 105.4 lb

## 2016-08-08 DIAGNOSIS — E039 Hypothyroidism, unspecified: Secondary | ICD-10-CM | POA: Diagnosis not present

## 2016-08-08 DIAGNOSIS — C50919 Malignant neoplasm of unspecified site of unspecified female breast: Secondary | ICD-10-CM | POA: Diagnosis not present

## 2016-08-08 DIAGNOSIS — I2510 Atherosclerotic heart disease of native coronary artery without angina pectoris: Secondary | ICD-10-CM

## 2016-08-08 DIAGNOSIS — R634 Abnormal weight loss: Secondary | ICD-10-CM

## 2016-08-08 DIAGNOSIS — E785 Hyperlipidemia, unspecified: Secondary | ICD-10-CM | POA: Diagnosis not present

## 2016-08-08 DIAGNOSIS — R2681 Unsteadiness on feet: Secondary | ICD-10-CM

## 2016-08-08 DIAGNOSIS — Z0000 Encounter for general adult medical examination without abnormal findings: Secondary | ICD-10-CM

## 2016-08-08 DIAGNOSIS — G8929 Other chronic pain: Secondary | ICD-10-CM

## 2016-08-08 DIAGNOSIS — M545 Low back pain: Secondary | ICD-10-CM

## 2016-08-08 DIAGNOSIS — I10 Essential (primary) hypertension: Secondary | ICD-10-CM | POA: Diagnosis not present

## 2016-08-08 MED ORDER — ATORVASTATIN CALCIUM 20 MG PO TABS: 20.0000 mg | ORAL_TABLET | Freq: Every day | ORAL | 1 refills | Status: AC

## 2016-08-08 MED ORDER — CITALOPRAM HYDROBROMIDE 20 MG PO TABS: 20.0000 mg | ORAL_TABLET | Freq: Every day | ORAL | 1 refills | Status: AC

## 2016-08-08 MED ORDER — LEVOTHYROXINE SODIUM 50 MCG PO TABS: 50.0000 ug | ORAL_TABLET | Freq: Every day | ORAL | 1 refills | Status: AC

## 2016-08-08 NOTE — Progress Notes (Signed)
Patient ID: Tenna Lacko, female   DOB: 1925/06/14, 83 y.o.   MRN: [id number]   Subjective:    Patient ID: [name], female    DOB: 01/24/1926, 83 y.o.   MRN: [id number]  HPI  Patient here for her physical exam.  She reports she is doing relatively well.  Does describe some unsteadiness with ambulation.  Discussed using a walker.  Also discussed home health to help with gait and balance.  She is in agreement.  No chest pain.  Breathing stable.  She reports noticing a place - left axilla.  Non tender.  She is eating.  No nausea or vomiting.  Bowels moving.     Past Medical History:  Diagnosis Date  . Breast cancer ([place])   . CAD (coronary artery disease)   . Degenerative disc disease   . GERD (gastroesophageal reflux disease)   . Hyperlipidemia    Previous intolerance of statins  . Hypothyroidism   . Orthostatic hypotension   . Osteoarthritis   . Peptic ulcer disease   . Syncope    Past Surgical History:  Procedure Laterality Date  . ABDOMINAL HYSTERECTOMY  1970  . APPENDECTOMY    . BIOPSY BREAST  2014  . BREAST EXCISIONAL BIOPSY Left 2014   neg  . BREAST EXCISIONAL BIOPSY Left 2006   postive radation  . BREAST LUMPECTOMY  2006  . CHOLECYSTECTOMY    . CORONARY ARTERY BYPASS GRAFT  05/2006   x3  . NASAL SINUS SURGERY    . ROTATOR CUFF REPAIR     Family History  Problem Relation Age of Onset  . Heart attack Sister   . Cancer Brother   . Pancreatic cancer Brother   . Ovarian cancer Sister    Social History   Social History  . Marital status: Widowed    Spouse name: N/A  . Number of children: N/A  . Years of education: N/A   Occupational History  . Retired Retired   Social History Main Topics  . Smoking status: Never Smoker  . Smokeless tobacco: Never Used  . Alcohol use No  . Drug use: No  . Sexual activity: Not Asked   Other Topics Concern  . None   Social History Narrative   Widowed   Gets regular exercise    Outpatient Encounter  Prescriptions as of 08/08/2016  Medication Sig  . amitriptyline (ELAVIL) 50 MG tablet Take 50 mg by mouth at bedtime.  Marland Kitchen amitriptyline (ELAVIL) 50 MG tablet TAKE 1 TABLET BY MOUTH AT BEDTIME  . aspirin EC 81 MG tablet Take 81 mg by mouth daily.  Marland Kitchen atorvastatin (LIPITOR) 20 MG tablet Take 1 tablet (20 mg total) by mouth daily.  . citalopram (CELEXA) 20 MG tablet Take 1 tablet (20 mg total) by mouth daily.  . fentaNYL (DURAGESIC - DOSED MCG/HR) 12 MCG/HR Place 1 patch (12.5 mcg total) onto the skin every 3 (three) days.  . fludrocortisone (FLORINEF) 0.1 MG tablet Take 1 tablet (0.1 mg total) by mouth daily.  . lansoprazole (PREVACID) 30 MG capsule Take 30 mg by mouth daily.    Marland Kitchen levothyroxine (SYNTHROID, LEVOTHROID) 50 MCG tablet Take 1 tablet (50 mcg total) by mouth daily.  . nitroGLYCERIN (NITROSTAT) 0.4 MG SL tablet Place 1 tablet (0.4 mg total) under the tongue every 5 (five) minutes as needed for chest pain.  . [DISCONTINUED] atorvastatin (LIPITOR) 20 MG tablet TAKE 1 TABLET BY MOUTH  DAILY  . [DISCONTINUED] citalopram (CELEXA) 20 MG tablet TAKE  1 TABLET BY MOUTH  DAILY  . [DISCONTINUED] levothyroxine (SYNTHROID, LEVOTHROID) 50 MCG tablet TAKE 1 TABLET BY MOUTH  DAILY   No facility-administered encounter medications on file as of 08/08/2016.     Review of Systems  Constitutional: Negative for appetite change and unexpected weight change.  HENT: Negative for congestion and sinus pressure.   Eyes: Negative for pain and visual disturbance.  Respiratory: Negative for cough, chest tightness and shortness of breath.   Cardiovascular: Negative for chest pain, palpitations and leg swelling.  Gastrointestinal: Negative for abdominal pain, diarrhea, nausea and vomiting.  Genitourinary: Negative for difficulty urinating and dysuria.  Musculoskeletal: Positive for back pain. Negative for joint swelling and myalgias.  Skin: Negative for color change and rash.  Neurological: Negative for dizziness,  light-headedness and headaches.  Hematological: Negative for adenopathy. Does not bruise/bleed easily.  Psychiatric/Behavioral: Negative for agitation and dysphoric mood.       Objective:    Physical Exam  Constitutional: She appears well-developed and well-nourished. No distress.  HENT:  Nose: Nose normal.  Mouth/Throat: Oropharynx is clear and moist.  Eyes: Conjunctivae are normal. Right eye exhibits no discharge. Left eye exhibits no discharge.  Neck: Neck supple. No thyromegaly present.  Cardiovascular: Normal rate and regular rhythm.   Pulmonary/Chest: Breath sounds normal. No respiratory distress. She has no wheezes.  Breast exam reveals no nipple discharge.  Superficial palpable area left breast - axilla.  No erythema.  Non tender.  No other palpable nodules or axillary adenopathy.    Abdominal: Soft. Bowel sounds are normal. There is no tenderness.  Musculoskeletal: She exhibits no edema or tenderness.  Lymphadenopathy:    She has no cervical adenopathy.  Skin: No rash noted. No erythema.  Psychiatric: She has a normal mood and affect. Her behavior is normal.    BP 130/74 (BP Location: Left Arm, Patient Position: Sitting, Cuff Size: Normal)   Pulse 77   Temp 97.8 F (36.6 C) (Oral)   Resp 12   Ht 5\' 3"  (1.6 m)   Wt 105 lb 6.4 oz (47.8 kg)   SpO2 98%   BMI 18.67 kg/m  Wt Readings from Last 3 Encounters:  08/08/16 105 lb 6.4 oz (47.8 kg)  07/04/16 103 lb (46.7 kg)  04/04/16 106 lb (48.1 kg)     Lab Results  Component Value Date   WBC 7.5 02/18/2016   HGB 12.0 02/18/2016   HCT 36.0 02/18/2016   PLT 262.0 02/18/2016   GLUCOSE 97 02/18/2016   CHOL 116 11/11/2015   TRIG 86.0 11/11/2015   HDL 49.50 11/11/2015   LDLCALC 49 11/11/2015   ALT 11 02/18/2016   AST 21 02/18/2016   NA 142 02/18/2016   K 4.2 02/18/2016   CL 108 02/18/2016   CREATININE 1.03 02/18/2016   BUN 25 (H) 02/18/2016   CO2 29 02/18/2016   TSH 3.07 04/04/2016   INR 1.14 10/01/2015    Mm  Screening Breast Tomo Bilateral  Result Date: 01/25/2016 CLINICAL DATA:  Screening. EXAM: 2D DIGITAL SCREENING BILATERAL MAMMOGRAM WITH CAD AND ADJUNCT TOMO COMPARISON:  Previous exam(s). ACR Breast Density Category b: There are scattered areas of fibroglandular density. FINDINGS: There are no findings suspicious for malignancy. Stable postsurgical changes on the left. Images were processed with CAD. IMPRESSION: No mammographic evidence of malignancy. A result letter of this screening mammogram will be mailed directly to the patient. RECOMMENDATION: Screening mammogram in one year. (Code:SM-B-01Y) BI-RADS CATEGORY  2: Benign. Electronically Signed   By: Shanon Brow  Ormond M.D.   On: 01/25/2016 12:01       Assessment & Plan:   Problem List Items Addressed This Visit    Back pain    Chronic pain.  Sees Dr Sharlet Salina.        Breast cancer ([place])    Last mammogram 01/25/16 - Birads II.  Notices the palpable area left axilla.  Discussed f/u mammogram/ultrasound. She prefers to have surgery evaluate.  Previously evaluated by Dr Pat Patrick.  Request referral to him or one of his partners.        Relevant Orders   Ambulatory referral to General Surgery   CAD, NATIVE VESSEL    Followed by cardiology.  Continue risk factor modification.  Stable.        Relevant Medications   atorvastatin (LIPITOR) 20 MG tablet   Health care maintenance    Physical today 08/08/16.  Mammogram 01/25/16 - Birads II.        Hyperlipemia    On lipitor.  Low cholesterol diet and exercise.  Follow lipid panel and liver function tests.        Relevant Medications   atorvastatin (LIPITOR) 20 MG tablet   HYPERTENSION, BENIGN    Blood pressure has been doing well.  Follow.  Same medication regimen.  Follow metabolic panel.        Relevant Medications   atorvastatin (LIPITOR) 20 MG tablet   Hypothyroidism    On thyroid replacement.  Follow tsh.        Relevant Medications   levothyroxine (SYNTHROID, LEVOTHROID) 50 MCG tablet    Loss of weight    States she is eating well.  Weight is up a couple of pounds.        Unsteady gait    Pt with unsteady gait and some balance issues.  Will have home health physical therapy evaluate and treat.        Relevant Orders   Ambulatory referral to Home Health       Einar Pheasant, MD

## 2016-08-08 NOTE — Progress Notes (Signed)
Pre-visit discussion using our clinic review tool. No additional management support is needed unless otherwise documented below in the visit note.  

## 2016-08-11 ENCOUNTER — Telehealth: Payer: Self-pay | Admitting: Internal Medicine

## 2016-08-11 NOTE — Telephone Encounter (Signed)
Patient daughter called she was little confused mom told her you found a bump under breast at the app she had this week. She thinks her mom may have been confused about it. I informed that you note is not done yet but I would send message to you. Also let her know that you are with patients and may not get return call from me until Monday. She is fine with that in no hurry just wanted to clarify.

## 2016-08-11 NOTE — Telephone Encounter (Signed)
Pt daughter [name] called and wanted more information in regards to last visit and breast exam, and needing mammogram. Please advise, thank you!  Call [facility] @ [phone] (cell)

## 2016-08-11 NOTE — Telephone Encounter (Signed)
Left message to return call to our office.  

## 2016-08-13 ENCOUNTER — Encounter: Payer: Self-pay | Admitting: Internal Medicine

## 2016-08-13 DIAGNOSIS — R2681 Unsteadiness on feet: Secondary | ICD-10-CM | POA: Insufficient documentation

## 2016-08-13 NOTE — Assessment & Plan Note (Signed)
Physical today 08/08/16.  Mammogram 01/25/16 - Birads II.

## 2016-08-13 NOTE — Assessment & Plan Note (Signed)
Followed by cardiology.  Continue risk factor modification.  Stable.   

## 2016-08-13 NOTE — Assessment & Plan Note (Signed)
Blood pressure has been doing well.  Follow.  Same medication regimen.  Follow metabolic panel.

## 2016-08-13 NOTE — Assessment & Plan Note (Signed)
On thyroid replacement.  Follow tsh.  

## 2016-08-13 NOTE — Assessment & Plan Note (Signed)
Chronic pain.  Sees Dr Sharlet Salina.

## 2016-08-13 NOTE — Telephone Encounter (Signed)
Called [name] and spoke with her regarding the palpable lesion and plans for surgery referral.  See note.  She is in agreement.

## 2016-08-13 NOTE — Assessment & Plan Note (Signed)
Last mammogram 01/25/16 - Birads II.  Notices the palpable area left axilla.  Discussed f/u mammogram/ultrasound. She prefers to have surgery evaluate.  Previously evaluated by Dr Pat Patrick.  Request referral to him or one of his partners.

## 2016-08-13 NOTE — Assessment & Plan Note (Signed)
On lipitor.  Low cholesterol diet and exercise.  Follow lipid panel and liver function tests.   

## 2016-08-13 NOTE — Assessment & Plan Note (Signed)
States she is eating well.  Weight is up a couple of pounds.

## 2016-08-13 NOTE — Assessment & Plan Note (Signed)
Pt with unsteady gait and some balance issues.  Will have home health physical therapy evaluate and treat.

## 2016-08-16 ENCOUNTER — Telehealth: Payer: Self-pay | Admitting: Internal Medicine

## 2016-08-16 DIAGNOSIS — I2510 Atherosclerotic heart disease of native coronary artery without angina pectoris: Secondary | ICD-10-CM | POA: Diagnosis not present

## 2016-08-16 DIAGNOSIS — R531 Weakness: Secondary | ICD-10-CM | POA: Diagnosis not present

## 2016-08-16 DIAGNOSIS — R2681 Unsteadiness on feet: Secondary | ICD-10-CM | POA: Diagnosis not present

## 2016-08-16 DIAGNOSIS — M545 Low back pain: Secondary | ICD-10-CM | POA: Diagnosis not present

## 2016-08-16 DIAGNOSIS — C50919 Malignant neoplasm of unspecified site of unspecified female breast: Secondary | ICD-10-CM | POA: Diagnosis not present

## 2016-08-16 NOTE — Telephone Encounter (Signed)
Lemuel from [place] called and is looking for verbal orders for 2x for 3 weeks and 1x for 3 weeks for a total of 9 visits. Please advise, thank you!  Call [facility] @ [phone]

## 2016-08-17 ENCOUNTER — Telehealth: Payer: Self-pay | Admitting: Internal Medicine

## 2016-08-17 ENCOUNTER — Ambulatory Visit (INDEPENDENT_AMBULATORY_CARE_PROVIDER_SITE_OTHER): Payer: Medicare Other | Admitting: Surgery

## 2016-08-17 ENCOUNTER — Encounter: Payer: Self-pay | Admitting: Surgery

## 2016-08-17 VITALS — BP 124/75 | HR 93 | Temp 97.6°F | Ht 63.0 in | Wt 102.8 lb

## 2016-08-17 DIAGNOSIS — N63 Unspecified lump in unspecified breast: Secondary | ICD-10-CM

## 2016-08-17 NOTE — Telephone Encounter (Signed)
Please advise 

## 2016-08-17 NOTE — Telephone Encounter (Signed)
ok 

## 2016-08-17 NOTE — Telephone Encounter (Signed)
Angela Howe [phone] called from [facility] regarding pt needing a diagnostic mammo breast due to pt having a history of breast cancer. Pt needs to have this before pt can have an appt. Thank you!

## 2016-08-17 NOTE — Telephone Encounter (Signed)
Called informed ok per Dr. Nicki Reaper

## 2016-08-17 NOTE — Telephone Encounter (Signed)
Spoke with Rashene at [facility] regarding this patient. Patient came to appointment today without having Diagnostic mammogram. Patient stated she went to [facility] recently and was told due to her age that she didn't need the Mammogram. However the patient has a history of breast cancer 2014.   Per Dr.Davis he prefers for the patient to have Diagnostic Mammogram ordered by Dr.Picchi PCP and if surgical intervention is needed then Dr.Cappella can make an appointment for the patient with our office.   Patient was not charged for an office visit today.  Patient was very understanding regarding the above and appreciated the fact that she was not charged,  although Dr.Davis explained this to her.

## 2016-08-17 NOTE — Patient Instructions (Signed)
We will call Dr.Pai so she can order your Diagnostic Mammogram. If you do not hear from someone from her office please call them. They will need to make an appointment after you have the Mammogram with our office. Please call our office if you have any problems or concerns.

## 2016-08-18 DIAGNOSIS — C50919 Malignant neoplasm of unspecified site of unspecified female breast: Secondary | ICD-10-CM | POA: Diagnosis not present

## 2016-08-18 DIAGNOSIS — M545 Low back pain: Secondary | ICD-10-CM | POA: Diagnosis not present

## 2016-08-18 DIAGNOSIS — R2681 Unsteadiness on feet: Secondary | ICD-10-CM | POA: Diagnosis not present

## 2016-08-18 DIAGNOSIS — R531 Weakness: Secondary | ICD-10-CM | POA: Diagnosis not present

## 2016-08-18 DIAGNOSIS — I2510 Atherosclerotic heart disease of native coronary artery without angina pectoris: Secondary | ICD-10-CM | POA: Diagnosis not present

## 2016-08-18 NOTE — Telephone Encounter (Signed)
Please call [name] (pts daughter) and let her know of surgery recommendation.  I had discussed diagnostic mammogram and pt was hesitant to have another mammogram.  If she is agreeable to have another mammogram, then I can order.  If not wanting to pursue another mammogram, then schedule a f/u breast exam with me and we can discuss this more.

## 2016-08-18 NOTE — Telephone Encounter (Signed)
Daughter will talk with mother this weekend and give me a call back on Monday and let me know what they would like  to schedule

## 2016-08-21 NOTE — Telephone Encounter (Signed)
Please schedule her in the open appt tomorrow.  Thanks

## 2016-08-21 NOTE — Telephone Encounter (Signed)
I can see her at 1:30 on 09/01/16.  Thanks

## 2016-08-21 NOTE — Telephone Encounter (Signed)
Spoke with [name] patient daughter patient prefers to come and see Dr . Nicki Reaper in regards to her mammogram.  Please contact her in regards to appointment [phone].

## 2016-08-21 NOTE — Telephone Encounter (Signed)
Spoke with patients daughter [name] and she is going to be out of town .  Is there any other time patient can be seen please advise.

## 2016-08-21 NOTE — Telephone Encounter (Signed)
Spoke with [name] daughter and she is scheduled for to get crown put on tomorrow  at 900 am in the morning.   She is going to be out of town May 4 th and really wants to come with her to the appointment so she can hear what's discussed.   Thanks.

## 2016-08-22 NOTE — Telephone Encounter (Signed)
Spoke with Angela Howe and she said May 9th at 1230 would work for her.   Angela Howe can you place on schedule please ? Thanks.

## 2016-08-22 NOTE — Telephone Encounter (Signed)
I can see her 09/06/16 at 12:30.

## 2016-08-22 NOTE — Telephone Encounter (Signed)
Pt scheduled  

## 2016-08-23 DIAGNOSIS — C50919 Malignant neoplasm of unspecified site of unspecified female breast: Secondary | ICD-10-CM | POA: Diagnosis not present

## 2016-08-23 DIAGNOSIS — R2681 Unsteadiness on feet: Secondary | ICD-10-CM | POA: Diagnosis not present

## 2016-08-23 DIAGNOSIS — R531 Weakness: Secondary | ICD-10-CM | POA: Diagnosis not present

## 2016-08-23 DIAGNOSIS — I2510 Atherosclerotic heart disease of native coronary artery without angina pectoris: Secondary | ICD-10-CM | POA: Diagnosis not present

## 2016-08-23 DIAGNOSIS — M545 Low back pain: Secondary | ICD-10-CM | POA: Diagnosis not present

## 2016-08-24 ENCOUNTER — Telehealth: Payer: Self-pay | Admitting: *Deleted

## 2016-08-24 NOTE — Telephone Encounter (Signed)
Neomia Dear [phone] of below.  When he last saw 4/25 patient BP 150/70, patient was feeling fine, no chest pain. . I advised him that patient wasn't on medication.  He checked with [facility]  pharmacy and they haven't filled since 2016 a

## 2016-08-24 NOTE — Telephone Encounter (Signed)
Noted.  If has not been taking and blood pressure ok, then remain off medication.

## 2016-08-24 NOTE — Telephone Encounter (Signed)
This was a prescribed medication that she has previously been on regularly.  Was originally prescribed by cardiology.  Need to know if she has been taking regularly.  If [facility] cannot tell, then will need to call pharmacy and see when last refilled.  If she has been off for a while - blood pressure was fine in office and would remain off.  If has been taking regulalry, let me know.

## 2016-08-24 NOTE — Telephone Encounter (Signed)
Please advise 

## 2016-08-24 NOTE — Telephone Encounter (Signed)
Lemuel from [place] requested to know if the medication fludrocortisone was needed . Patient has this medication bottle with other medications  Please contact Angela Howe [phone]

## 2016-08-25 ENCOUNTER — Telehealth: Payer: Self-pay | Admitting: Internal Medicine

## 2016-08-25 DIAGNOSIS — R531 Weakness: Secondary | ICD-10-CM | POA: Diagnosis not present

## 2016-08-25 DIAGNOSIS — C50919 Malignant neoplasm of unspecified site of unspecified female breast: Secondary | ICD-10-CM | POA: Diagnosis not present

## 2016-08-25 DIAGNOSIS — R2681 Unsteadiness on feet: Secondary | ICD-10-CM | POA: Diagnosis not present

## 2016-08-25 DIAGNOSIS — M545 Low back pain: Secondary | ICD-10-CM | POA: Diagnosis not present

## 2016-08-25 DIAGNOSIS — I2510 Atherosclerotic heart disease of native coronary artery without angina pectoris: Secondary | ICD-10-CM | POA: Diagnosis not present

## 2016-08-25 NOTE — Telephone Encounter (Signed)
Spoke to patient she did not feel she needs to be seen. If any thing changes she will go to walkin or ed or call our office.

## 2016-08-25 NOTE — Telephone Encounter (Signed)
Confirm she does not feel needs evaluation.  If no, then monitor.  If any problems evaluation.

## 2016-08-25 NOTE — Telephone Encounter (Addendum)
Left voice mail to call back for [facility]

## 2016-08-25 NOTE — Telephone Encounter (Signed)
Skeet Simmer from [facility] called and stated that pt had fallen this morning. She was outside talking to a friend when walking back into the house her hand slipped off the railing. Pt fell and hit bottom first and hit left elbow. Left elbow is bruised and their is a small skin tear but no bleeding.  Pt was not using a walking device at the time. Pt is in no pain at this time, was advised to make sure that she uses her walker at all times. Skeet Simmer would also like to report that pt's bp is 104/50.   Call Gananda @ [phone]

## 2016-08-25 NOTE — Telephone Encounter (Signed)
Called pt states fell on bottom did not hit her head at all has left elbow bruise and small skin tear. She is not having any additional trouble with walking or busing on back please advise.

## 2016-08-28 ENCOUNTER — Telehealth: Payer: Self-pay

## 2016-08-28 DIAGNOSIS — C50919 Malignant neoplasm of unspecified site of unspecified female breast: Secondary | ICD-10-CM | POA: Diagnosis not present

## 2016-08-28 DIAGNOSIS — R2681 Unsteadiness on feet: Secondary | ICD-10-CM | POA: Diagnosis not present

## 2016-08-28 DIAGNOSIS — I2510 Atherosclerotic heart disease of native coronary artery without angina pectoris: Secondary | ICD-10-CM | POA: Diagnosis not present

## 2016-08-28 DIAGNOSIS — M545 Low back pain: Secondary | ICD-10-CM | POA: Diagnosis not present

## 2016-08-28 DIAGNOSIS — R531 Weakness: Secondary | ICD-10-CM | POA: Diagnosis not present

## 2016-08-28 NOTE — Telephone Encounter (Signed)
Home health called states that on 25th and 30th patients blood pressures have been 160/80. Patient is not having any symptoms at all. No dizziness changes in vision or chest pain. Home health has advised patient per my request that if any thing changes to go to ED otherwise we will in form provider and she will receive a call from our office tomorrow. Patient informed you are not in the office if any changes in symptoms go to ER

## 2016-08-28 NOTE — Telephone Encounter (Signed)
Left voice mail to call back 

## 2016-08-29 NOTE — Telephone Encounter (Signed)
If the blood pressure is elevated, no she does not need the florinef (see previous message).  Have them to continue to spot check her pressure and record.  Make sure she is seated and has rested a few minutes prior to taking.  Can send in readings.  She has an appt with me soon and I will reassess blood pressure as well.

## 2016-08-29 NOTE — Telephone Encounter (Signed)
No answer no vm

## 2016-08-30 NOTE — Telephone Encounter (Signed)
Patient informed will bring readings when she comes in office. Did not have any questions.

## 2016-08-31 DIAGNOSIS — R531 Weakness: Secondary | ICD-10-CM | POA: Diagnosis not present

## 2016-08-31 DIAGNOSIS — R2681 Unsteadiness on feet: Secondary | ICD-10-CM | POA: Diagnosis not present

## 2016-08-31 DIAGNOSIS — M545 Low back pain: Secondary | ICD-10-CM | POA: Diagnosis not present

## 2016-08-31 DIAGNOSIS — I2510 Atherosclerotic heart disease of native coronary artery without angina pectoris: Secondary | ICD-10-CM | POA: Diagnosis not present

## 2016-08-31 DIAGNOSIS — C50919 Malignant neoplasm of unspecified site of unspecified female breast: Secondary | ICD-10-CM | POA: Diagnosis not present

## 2016-09-01 DIAGNOSIS — E039 Hypothyroidism, unspecified: Secondary | ICD-10-CM | POA: Diagnosis not present

## 2016-09-01 DIAGNOSIS — I10 Essential (primary) hypertension: Secondary | ICD-10-CM | POA: Diagnosis not present

## 2016-09-01 DIAGNOSIS — C50919 Malignant neoplasm of unspecified site of unspecified female breast: Secondary | ICD-10-CM | POA: Diagnosis not present

## 2016-09-01 DIAGNOSIS — R531 Weakness: Secondary | ICD-10-CM | POA: Diagnosis not present

## 2016-09-01 DIAGNOSIS — I2510 Atherosclerotic heart disease of native coronary artery without angina pectoris: Secondary | ICD-10-CM | POA: Diagnosis not present

## 2016-09-01 DIAGNOSIS — M545 Low back pain: Secondary | ICD-10-CM | POA: Diagnosis not present

## 2016-09-01 DIAGNOSIS — R2681 Unsteadiness on feet: Secondary | ICD-10-CM | POA: Diagnosis not present

## 2016-09-06 ENCOUNTER — Encounter: Payer: Self-pay | Admitting: Internal Medicine

## 2016-09-06 ENCOUNTER — Other Ambulatory Visit: Payer: Self-pay | Admitting: Internal Medicine

## 2016-09-06 ENCOUNTER — Ambulatory Visit (INDEPENDENT_AMBULATORY_CARE_PROVIDER_SITE_OTHER): Payer: Medicare Other | Admitting: Internal Medicine

## 2016-09-06 DIAGNOSIS — R2681 Unsteadiness on feet: Secondary | ICD-10-CM | POA: Diagnosis not present

## 2016-09-06 DIAGNOSIS — Z853 Personal history of malignant neoplasm of breast: Secondary | ICD-10-CM | POA: Diagnosis not present

## 2016-09-06 DIAGNOSIS — G8929 Other chronic pain: Secondary | ICD-10-CM

## 2016-09-06 DIAGNOSIS — M545 Low back pain, unspecified: Secondary | ICD-10-CM

## 2016-09-06 DIAGNOSIS — R634 Abnormal weight loss: Secondary | ICD-10-CM | POA: Diagnosis not present

## 2016-09-06 DIAGNOSIS — I10 Essential (primary) hypertension: Secondary | ICD-10-CM

## 2016-09-06 NOTE — Progress Notes (Signed)
Patient ID: Angela Howe, female   DOB: August 24, 1925, 83 y.o.   MRN: [id number]   Subjective:    Patient ID: Angela Howe, female    DOB: Sep 07, 1925, 83 y.o.   MRN: [id number]  HPI  Patient here for a scheduled follow up.  She previously noticed a left breast "lump".  She was seen recently and mentioned noticing some fullness in her left breast.  She had preferred to f/u with surgery instead of doing another mammogram.  Surgery appt made and when she arrived she was informed need to first have mammogram.  Preferred to f/u here for discussion of further w/up.  She states she can still palpate the area.  No change.  No pain.  No nipple discharge.  Working with therapy.  Blood pressure has been a little elevated.  She confirmed today that she is off florinef.  Had previous issues with blood pressure dropping with standing.  See previous notes.  Had been controlled on florinef.  Now that blood pressure is elevated - off florinef.  No chest pain.  Breathing stable.  Eating and drinking.  No nausea or vomiting.  Bowels stable.     Past Medical History:  Diagnosis Date  . Breast cancer ([place])   . CAD (coronary artery disease)   . Degenerative disc disease   . GERD (gastroesophageal reflux disease)   . Hyperlipidemia    Previous intolerance of statins  . Hypothyroidism   . Orthostatic hypotension   . Osteoarthritis   . Peptic ulcer disease   . Syncope    Past Surgical History:  Procedure Laterality Date  . ABDOMINAL HYSTERECTOMY  1970  . APPENDECTOMY    . BIOPSY BREAST  2014  . BREAST EXCISIONAL BIOPSY Left 2014   neg  . BREAST EXCISIONAL BIOPSY Left 2006   postive radation  . BREAST LUMPECTOMY  2006  . CHOLECYSTECTOMY    . CORONARY ARTERY BYPASS GRAFT  05/2006   x3  . NASAL SINUS SURGERY    . ROTATOR CUFF REPAIR     Family History  Problem Relation Age of Onset  . Heart attack Sister   . Cancer Brother   . Pancreatic cancer Brother   . Ovarian cancer Sister    Social  History   Social History  . Marital status: Widowed    Spouse name: N/A  . Number of children: N/A  . Years of education: N/A   Occupational History  . Retired Retired   Social History Main Topics  . Smoking status: Never Smoker  . Smokeless tobacco: Never Used  . Alcohol use No  . Drug use: No  . Sexual activity: Not Asked   Other Topics Concern  . None   Social History Narrative   Widowed   Gets regular exercise    Outpatient Encounter Prescriptions as of 09/06/2016  Medication Sig  . aspirin EC 81 MG tablet Take 81 mg by mouth daily.  Marland Kitchen atorvastatin (LIPITOR) 20 MG tablet Take 1 tablet (20 mg total) by mouth daily.  . citalopram (CELEXA) 20 MG tablet Take 1 tablet (20 mg total) by mouth daily.  . fentaNYL (DURAGESIC - DOSED MCG/HR) 12 MCG/HR Place 1 patch (12.5 mcg total) onto the skin every 3 (three) days.  . fludrocortisone (FLORINEF) 0.1 MG tablet Take 1 tablet (0.1 mg total) by mouth daily.  . lansoprazole (PREVACID) 30 MG capsule Take 30 mg by mouth daily.    Marland Kitchen levothyroxine (SYNTHROID, LEVOTHROID) 50 MCG tablet Take 1 tablet (  50 mcg total) by mouth daily.  . nitroGLYCERIN (NITROSTAT) 0.4 MG SL tablet Place 1 tablet (0.4 mg total) under the tongue every 5 (five) minutes as needed for chest pain.  . [DISCONTINUED] amitriptyline (ELAVIL) 50 MG tablet Take 50 mg by mouth at bedtime.   No facility-administered encounter medications on file as of 09/06/2016.     Review of Systems  Constitutional: Negative for appetite change and unexpected weight change.  HENT: Negative for congestion and sinus pressure.   Respiratory: Negative for cough, chest tightness and shortness of breath.   Cardiovascular: Negative for chest pain, palpitations and leg swelling.  Gastrointestinal: Negative for abdominal pain, diarrhea, nausea and vomiting.  Genitourinary: Negative for difficulty urinating and dysuria.  Musculoskeletal: Negative for joint swelling and myalgias.  Skin: Negative for  color change and rash.  Neurological: Negative for dizziness, light-headedness and headaches.  Psychiatric/Behavioral: Negative for agitation and dysphoric mood.       Objective:     Blood pressure rechecked by me:  152/70.  Blood pressure dropped to 140/68 standing.    Physical Exam  Constitutional: She appears well-developed and well-nourished. No distress.  HENT:  Nose: Nose normal.  Mouth/Throat: Oropharynx is clear and moist.  Neck: Neck supple. No thyromegaly present.  Cardiovascular: Normal rate and regular rhythm.   Pulmonary/Chest: Breath sounds normal. No respiratory distress. She has no wheezes.  Breast exam:  No nipple discharge or nipple retraction present.  Incision site - well healed.  Some fullness left breast - 2-3:00.  Non tender.  No palpable axillary lymph nodes.    Abdominal: Soft. Bowel sounds are normal. There is no tenderness.  Musculoskeletal: She exhibits no edema or tenderness.  Lymphadenopathy:    She has no cervical adenopathy.  Skin: No rash noted. No erythema.  Psychiatric: She has a normal mood and affect. Her behavior is normal.    BP (!) 164/80   Pulse 73   Temp 97.6 F (36.4 C) (Oral)   Wt 105 lb 3.2 oz (47.7 kg)   SpO2 97%   BMI 18.64 kg/m  Wt Readings from Last 3 Encounters:  09/06/16 105 lb 3.2 oz (47.7 kg)  08/17/16 102 lb 12.8 oz (46.6 kg)  08/08/16 105 lb 6.4 oz (47.8 kg)     Lab Results  Component Value Date   WBC 7.5 02/18/2016   HGB 12.0 02/18/2016   HCT 36.0 02/18/2016   PLT 262.0 02/18/2016   GLUCOSE 97 02/18/2016   CHOL 116 11/11/2015   TRIG 86.0 11/11/2015   HDL 49.50 11/11/2015   LDLCALC 49 11/11/2015   ALT 11 02/18/2016   AST 21 02/18/2016   NA 142 02/18/2016   K 4.2 02/18/2016   CL 108 02/18/2016   CREATININE 1.03 02/18/2016   BUN 25 (H) 02/18/2016   CO2 29 02/18/2016   TSH 3.07 04/04/2016   INR 1.14 10/01/2015    Mm Screening Breast Tomo Bilateral  Result Date: 01/25/2016 CLINICAL DATA:  Screening.  EXAM: 2D DIGITAL SCREENING BILATERAL MAMMOGRAM WITH CAD AND ADJUNCT TOMO COMPARISON:  Previous exam(s). ACR Breast Density Category b: There are scattered areas of fibroglandular density. FINDINGS: There are no findings suspicious for malignancy. Stable postsurgical changes on the left. Images were processed with CAD. IMPRESSION: No mammographic evidence of malignancy. A result letter of this screening mammogram will be mailed directly to the patient. RECOMMENDATION: Screening mammogram in one year. (Code:SM-B-01Y) BI-RADS CATEGORY  2: Benign. Electronically Signed   By: Lajean Manes M.D.   On:  01/25/2016 12:01       Assessment & Plan:   Problem List Items Addressed This Visit    Back pain    Chronic pain.  Stable.  Followed by Dr Sharlet Salina.        History of breast cancer    Last mammogram 01/25/16 - Birads II.  Noticed the palpable area as outlined.  Discussed with her today.  Discussed diagnostic mammogram.  She declines.  Desires to monitor.  Desires no further intervention.  Wants to follow.        HYPERTENSION, BENIGN    Blood pressure has been noted to be a little elevated today.  Blood pressure checked by me as outlined.  Will hold on medication given some decrease with standing.  Remain off florinef.  Follow pressures.  Follow metabolic panel.        Loss of weight    Weight back up a few pounds from last check.  Follow.        Unsteady gait    Working with physical therapy.  Follow.           Einar Pheasant, MD

## 2016-09-08 DIAGNOSIS — C50919 Malignant neoplasm of unspecified site of unspecified female breast: Secondary | ICD-10-CM | POA: Diagnosis not present

## 2016-09-08 DIAGNOSIS — R2681 Unsteadiness on feet: Secondary | ICD-10-CM | POA: Diagnosis not present

## 2016-09-08 DIAGNOSIS — M545 Low back pain: Secondary | ICD-10-CM | POA: Diagnosis not present

## 2016-09-08 DIAGNOSIS — R531 Weakness: Secondary | ICD-10-CM | POA: Diagnosis not present

## 2016-09-08 DIAGNOSIS — I2510 Atherosclerotic heart disease of native coronary artery without angina pectoris: Secondary | ICD-10-CM | POA: Diagnosis not present

## 2016-09-08 NOTE — Progress Notes (Signed)
Erroneous encounter

## 2016-09-11 ENCOUNTER — Telehealth: Payer: Self-pay

## 2016-09-11 NOTE — Telephone Encounter (Signed)
Fax received put in your red folder with copy of meds.

## 2016-09-11 NOTE — Telephone Encounter (Signed)
Signed and placed in your box.   

## 2016-09-11 NOTE — Telephone Encounter (Signed)
Faxed copy put in folder with charge sheet at reception.

## 2016-09-13 DIAGNOSIS — I2510 Atherosclerotic heart disease of native coronary artery without angina pectoris: Secondary | ICD-10-CM | POA: Diagnosis not present

## 2016-09-13 DIAGNOSIS — M545 Low back pain: Secondary | ICD-10-CM | POA: Diagnosis not present

## 2016-09-13 DIAGNOSIS — C50919 Malignant neoplasm of unspecified site of unspecified female breast: Secondary | ICD-10-CM | POA: Diagnosis not present

## 2016-09-13 DIAGNOSIS — R531 Weakness: Secondary | ICD-10-CM | POA: Diagnosis not present

## 2016-09-13 DIAGNOSIS — R2681 Unsteadiness on feet: Secondary | ICD-10-CM | POA: Diagnosis not present

## 2016-09-18 ENCOUNTER — Encounter: Payer: Self-pay | Admitting: Internal Medicine

## 2016-09-18 NOTE — Assessment & Plan Note (Signed)
Chronic pain.  Stable.  Followed by Dr Sharlet Salina.

## 2016-09-18 NOTE — Assessment & Plan Note (Signed)
Last mammogram 01/25/16 - Birads II.  Noticed the palpable area as outlined.  Discussed with her today.  Discussed diagnostic mammogram.  She declines.  Desires to monitor.  Desires no further intervention.  Wants to follow.

## 2016-09-18 NOTE — Assessment & Plan Note (Signed)
Blood pressure has been noted to be a little elevated today.  Blood pressure checked by me as outlined.  Will hold on medication given some decrease with standing.  Remain off florinef.  Follow pressures.  Follow metabolic panel.

## 2016-09-18 NOTE — Assessment & Plan Note (Signed)
Working with physical therapy.  Follow.

## 2016-09-18 NOTE — Assessment & Plan Note (Signed)
Weight back up a few pounds from last check.  Follow.

## 2016-09-20 DIAGNOSIS — C50919 Malignant neoplasm of unspecified site of unspecified female breast: Secondary | ICD-10-CM | POA: Diagnosis not present

## 2016-09-20 DIAGNOSIS — M545 Low back pain: Secondary | ICD-10-CM | POA: Diagnosis not present

## 2016-09-20 DIAGNOSIS — R531 Weakness: Secondary | ICD-10-CM | POA: Diagnosis not present

## 2016-09-20 DIAGNOSIS — R2681 Unsteadiness on feet: Secondary | ICD-10-CM | POA: Diagnosis not present

## 2016-09-20 DIAGNOSIS — I2510 Atherosclerotic heart disease of native coronary artery without angina pectoris: Secondary | ICD-10-CM | POA: Diagnosis not present

## 2016-10-23 DIAGNOSIS — M48062 Spinal stenosis, lumbar region with neurogenic claudication: Secondary | ICD-10-CM | POA: Diagnosis not present

## 2016-10-23 DIAGNOSIS — M5416 Radiculopathy, lumbar region: Secondary | ICD-10-CM | POA: Diagnosis not present

## 2016-10-23 DIAGNOSIS — M5136 Other intervertebral disc degeneration, lumbar region: Secondary | ICD-10-CM | POA: Diagnosis not present

## 2016-10-27 DIAGNOSIS — H353132 Nonexudative age-related macular degeneration, bilateral, intermediate dry stage: Secondary | ICD-10-CM | POA: Diagnosis not present

## 2016-11-17 ENCOUNTER — Encounter: Payer: Self-pay | Admitting: Internal Medicine

## 2016-11-17 ENCOUNTER — Ambulatory Visit (INDEPENDENT_AMBULATORY_CARE_PROVIDER_SITE_OTHER): Payer: Medicare Other | Admitting: Internal Medicine

## 2016-11-17 VITALS — BP 134/78 | HR 80 | Temp 98.2°F | Resp 12 | Ht 63.0 in | Wt 103.2 lb

## 2016-11-17 DIAGNOSIS — G8929 Other chronic pain: Secondary | ICD-10-CM | POA: Diagnosis not present

## 2016-11-17 DIAGNOSIS — D649 Anemia, unspecified: Secondary | ICD-10-CM

## 2016-11-17 DIAGNOSIS — R2681 Unsteadiness on feet: Secondary | ICD-10-CM

## 2016-11-17 DIAGNOSIS — R634 Abnormal weight loss: Secondary | ICD-10-CM | POA: Diagnosis not present

## 2016-11-17 DIAGNOSIS — Z23 Encounter for immunization: Secondary | ICD-10-CM | POA: Diagnosis not present

## 2016-11-17 DIAGNOSIS — I2510 Atherosclerotic heart disease of native coronary artery without angina pectoris: Secondary | ICD-10-CM

## 2016-11-17 DIAGNOSIS — E039 Hypothyroidism, unspecified: Secondary | ICD-10-CM | POA: Diagnosis not present

## 2016-11-17 DIAGNOSIS — Z853 Personal history of malignant neoplasm of breast: Secondary | ICD-10-CM | POA: Diagnosis not present

## 2016-11-17 DIAGNOSIS — M545 Low back pain, unspecified: Secondary | ICD-10-CM

## 2016-11-17 DIAGNOSIS — E785 Hyperlipidemia, unspecified: Secondary | ICD-10-CM

## 2016-11-17 DIAGNOSIS — I10 Essential (primary) hypertension: Secondary | ICD-10-CM

## 2016-11-17 LAB — HEPATIC FUNCTION PANEL
ALBUMIN: 3.2 g/dL — AB (ref 3.5–5.2)
ALK PHOS: 53 U/L (ref 39–117)
ALT: 11 U/L (ref 0–35)
AST: 18 U/L (ref 0–37)
Bilirubin, Direct: 0 mg/dL (ref 0.0–0.3)
Total Bilirubin: 0.3 mg/dL (ref 0.2–1.2)
Total Protein: 5.7 g/dL — ABNORMAL LOW (ref 6.0–8.3)

## 2016-11-17 LAB — BASIC METABOLIC PANEL
BUN: 21 mg/dL (ref 6–23)
CALCIUM: 8.7 mg/dL (ref 8.4–10.5)
CO2: 27 mEq/L (ref 19–32)
CREATININE: 0.96 mg/dL (ref 0.40–1.20)
Chloride: 109 mEq/L (ref 96–112)
GFR: 58.18 mL/min — AB (ref >60.00)
Glucose, Bld: 96 mg/dL (ref 70–99)
Potassium: 4.9 mEq/L (ref 3.5–5.1)
Sodium: 140 mEq/L (ref 135–145)

## 2016-11-17 LAB — LIPID PANEL
Cholesterol: 116 mg/dL (ref 0–200)
HDL: 57.5 mg/dL (ref >39.00)
LDL Cholesterol: 43 mg/dL (ref 0–99)
NonHDL: 58.52
TRIGLYCERIDES: 79 mg/dL (ref 0.0–149.0)
Total CHOL/HDL Ratio: 2
VLDL: 15.8 mg/dL (ref 0.0–40.0)

## 2016-11-17 LAB — TSH: TSH: 5.5 u[IU]/mL — AB (ref 0.35–4.50)

## 2016-11-17 NOTE — Progress Notes (Signed)
Patient ID: Angela Howe, female   DOB: 1925/06/19, 83 y.o.   MRN: [id number]   Subjective:    Patient ID: Angela Howe, female    DOB: [birth year], 83 y.o.   MRN: [id number]  HPI  Patient here for a scheduled follow up.  She reports she is doing relatively well.  Seeing Dr Sharlet Salina for her back.  Scheduled for injection 12/07/16.   No chest pain.  Breathing stable.  No acid reflux.  No abdominal pain.  Bowels moving.  Worked with therapy.  States she feels she is getting around her house - relatively well.  Able to do the things she needs to do.  Has good support from her family.     Past Medical History:  Diagnosis Date  . Breast cancer ([place])   . CAD (coronary artery disease)   . Degenerative disc disease   . GERD (gastroesophageal reflux disease)   . Hyperlipidemia    Previous intolerance of statins  . Hypothyroidism   . Orthostatic hypotension   . Osteoarthritis   . Peptic ulcer disease   . Syncope    Past Surgical History:  Procedure Laterality Date  . ABDOMINAL HYSTERECTOMY  1970  . APPENDECTOMY    . BIOPSY BREAST  2014  . BREAST EXCISIONAL BIOPSY Left 2014   neg  . BREAST EXCISIONAL BIOPSY Left 2006   postive radation  . BREAST LUMPECTOMY  2006  . CHOLECYSTECTOMY    . CORONARY ARTERY BYPASS GRAFT  05/2006   x3  . NASAL SINUS SURGERY    . ROTATOR CUFF REPAIR     Family History  Problem Relation Age of Onset  . Heart attack Sister   . Cancer Brother   . Pancreatic cancer Brother   . Ovarian cancer Sister    Social History   Social History  . Marital status: Widowed    Spouse name: N/A  . Number of children: N/A  . Years of education: N/A   Occupational History  . Retired Retired   Social History Main Topics  . Smoking status: Never Smoker  . Smokeless tobacco: Never Used  . Alcohol use No  . Drug use: No  . Sexual activity: Not Asked   Other Topics Concern  . None   Social History Narrative   Widowed   Gets regular exercise     Outpatient Encounter Prescriptions as of 11/17/2016  Medication Sig  . amitriptyline (ELAVIL) 50 MG tablet TAKE 1 TABLET BY MOUTH AT BEDTIME  . aspirin EC 81 MG tablet Take 81 mg by mouth daily.  Marland Kitchen atorvastatin (LIPITOR) 20 MG tablet Take 1 tablet (20 mg total) by mouth daily.  . citalopram (CELEXA) 20 MG tablet Take 1 tablet (20 mg total) by mouth daily.  . fentaNYL (DURAGESIC - DOSED MCG/HR) 12 MCG/HR Place 1 patch (12.5 mcg total) onto the skin every 3 (three) days.  . fludrocortisone (FLORINEF) 0.1 MG tablet Take 1 tablet (0.1 mg total) by mouth daily.  . lansoprazole (PREVACID) 30 MG capsule Take 30 mg by mouth daily.    Marland Kitchen levothyroxine (SYNTHROID, LEVOTHROID) 50 MCG tablet Take 1 tablet (50 mcg total) by mouth daily.  . nitroGLYCERIN (NITROSTAT) 0.4 MG SL tablet Place 1 tablet (0.4 mg total) under the tongue every 5 (five) minutes as needed for chest pain.   No facility-administered encounter medications on file as of 11/17/2016.     Review of Systems  Constitutional: Negative for appetite change and unexpected weight change.  HENT: Negative  for congestion and sinus pressure.   Respiratory: Negative for cough, chest tightness and shortness of breath.   Cardiovascular: Negative for chest pain, palpitations and leg swelling.  Gastrointestinal: Negative for abdominal pain, diarrhea, nausea and vomiting.  Genitourinary: Negative for difficulty urinating and dysuria.  Musculoskeletal: Positive for back pain. Negative for myalgias.  Skin: Negative for color change and rash.  Neurological: Negative for dizziness, light-headedness and headaches.  Psychiatric/Behavioral: Negative for agitation and dysphoric mood.       Objective:     Blood pressure rechecked by me:  134/78  Physical Exam  Constitutional: She appears well-developed and well-nourished. No distress.  HENT:  Nose: Nose normal.  Mouth/Throat: Oropharynx is clear and moist.  Neck: Neck supple. No thyromegaly present.   Cardiovascular: Normal rate and regular rhythm.   Pulmonary/Chest: Breath sounds normal. No respiratory distress. She has no wheezes.  Abdominal: Soft. Bowel sounds are normal. There is no tenderness.  Musculoskeletal: She exhibits no edema or tenderness.  Lymphadenopathy:    She has no cervical adenopathy.  Skin: No rash noted. No erythema.  Psychiatric: She has a normal mood and affect. Her behavior is normal.    BP 134/78   Pulse 80   Temp 98.2 F (36.8 C) (Oral)   Resp 12   Ht 5\' 3"  (1.6 m)   Wt 103 lb 3.2 oz (46.8 kg)   SpO2 95%   BMI 18.28 kg/m  Wt Readings from Last 3 Encounters:  11/17/16 103 lb 3.2 oz (46.8 kg)  09/06/16 105 lb 3.2 oz (47.7 kg)  08/17/16 102 lb 12.8 oz (46.6 kg)     Lab Results  Component Value Date   WBC 7.5 02/18/2016   HGB 12.0 02/18/2016   HCT 36.0 02/18/2016   PLT 262.0 02/18/2016   GLUCOSE 96 11/17/2016   CHOL 116 11/17/2016   TRIG 79.0 11/17/2016   HDL 57.50 11/17/2016   LDLCALC 43 11/17/2016   ALT 11 11/17/2016   AST 18 11/17/2016   NA 140 11/17/2016   K 4.9 11/17/2016   CL 109 11/17/2016   CREATININE 0.96 11/17/2016   BUN 21 11/17/2016   CO2 27 11/17/2016   TSH 5.50 (H) 11/17/2016   INR 1.14 10/01/2015    Mm Screening Breast Tomo Bilateral  Result Date: 01/25/2016 CLINICAL DATA:  Screening. EXAM: 2D DIGITAL SCREENING BILATERAL MAMMOGRAM WITH CAD AND ADJUNCT TOMO COMPARISON:  Previous exam(s). ACR Breast Density Category b: There are scattered areas of fibroglandular density. FINDINGS: There are no findings suspicious for malignancy. Stable postsurgical changes on the left. Images were processed with CAD. IMPRESSION: No mammographic evidence of malignancy. A result letter of this screening mammogram will be mailed directly to the patient. RECOMMENDATION: Screening mammogram in one year. (Code:SM-B-01Y) BI-RADS CATEGORY  2: Benign. Electronically Signed   By: Lajean Manes M.D.   On: 01/25/2016 12:01       Assessment & Plan:    Problem List Items Addressed This Visit    Anemia    Last hgb 12.0 - 01/2016.        Back pain    Saw dr Sharlet Salina.  Planning for injections 12/07/16.        CAD, NATIVE VESSEL    Followed by cardiology.  Continue risk factor modification.  Stable.  Continue risk factor modification.       History of breast cancer    Last mammogram 01/25/16 - Birads II.  Noticed the palpable area as outlined previously.  Discussed with her again  today.  She desires no further mammogram or testing at this time.  Follow.       Hyperlipemia    On lipitor.  Follow lipid panel and liver function tests.        HYPERTENSION, BENIGN    Blood pressure much better.  On no medication.  Follow.       Hypothyroidism    On thyroid replacement.  Follow tsh.       Loss of weight    Weight relatively stable over the last few checks.  Discussed with her today. She is eating.  No nausea or vomiting.  Follow.        Unsteady gait    Worked with therapy.  Does feel helped.  Follow.         Other Visit Diagnoses    Need for pneumococcal vaccination    -  Primary   Relevant Orders   Pneumococcal polysaccharide vaccine 23-valent greater than or equal to 2yo subcutaneous/IM (Completed)       Einar Pheasant, MD

## 2016-11-17 NOTE — Progress Notes (Signed)
Pre-visit discussion using our clinic review tool. No additional management support is needed unless otherwise documented below in the visit note.  

## 2016-11-19 ENCOUNTER — Other Ambulatory Visit: Payer: Self-pay | Admitting: Internal Medicine

## 2016-11-19 ENCOUNTER — Encounter: Payer: Self-pay | Admitting: Internal Medicine

## 2016-11-19 DIAGNOSIS — E039 Hypothyroidism, unspecified: Secondary | ICD-10-CM

## 2016-11-19 NOTE — Assessment & Plan Note (Signed)
Weight relatively stable over the last few checks.  Discussed with her today. She is eating.  No nausea or vomiting.  Follow.

## 2016-11-19 NOTE — Progress Notes (Signed)
Order placed for f/u tsh.  

## 2016-11-19 NOTE — Assessment & Plan Note (Signed)
Followed by cardiology.  Continue risk factor modification.  Stable.  Continue risk factor modification.

## 2016-11-19 NOTE — Assessment & Plan Note (Signed)
Worked with therapy.  Does feel helped.  Follow.

## 2016-11-19 NOTE — Assessment & Plan Note (Signed)
Saw dr Sharlet Salina.  Planning for injections 12/07/16.

## 2016-11-19 NOTE — Assessment & Plan Note (Signed)
On lipitor.  Follow lipid panel and liver function tests.   

## 2016-11-19 NOTE — Assessment & Plan Note (Signed)
Last hgb 12.0 - 01/2016.

## 2016-11-19 NOTE — Assessment & Plan Note (Signed)
On thyroid replacement.  Follow tsh.  

## 2016-11-19 NOTE — Assessment & Plan Note (Signed)
Blood pressure much better.  On no medication.  Follow.

## 2016-11-19 NOTE — Assessment & Plan Note (Signed)
Last mammogram 01/25/16 - Birads II.  Noticed the palpable area as outlined previously.  Discussed with her again today.  She desires no further mammogram or testing at this time.  Follow.

## 2016-12-07 ENCOUNTER — Other Ambulatory Visit: Payer: Self-pay | Admitting: Internal Medicine

## 2016-12-07 DIAGNOSIS — M5136 Other intervertebral disc degeneration, lumbar region: Secondary | ICD-10-CM | POA: Diagnosis not present

## 2016-12-07 DIAGNOSIS — M5416 Radiculopathy, lumbar region: Secondary | ICD-10-CM | POA: Diagnosis not present

## 2016-12-07 DIAGNOSIS — M48062 Spinal stenosis, lumbar region with neurogenic claudication: Secondary | ICD-10-CM | POA: Diagnosis not present

## 2016-12-21 ENCOUNTER — Inpatient Hospital Stay
Admission: EM | Admit: 2016-12-21 | Discharge: 2016-12-24 | DRG: 386 | Disposition: A | Discharge status: Home Health | Payer: Medicare Other | Attending: Internal Medicine | Admitting: Internal Medicine

## 2016-12-21 ENCOUNTER — Encounter: Payer: Self-pay | Admitting: Emergency Medicine

## 2016-12-21 ENCOUNTER — Emergency Department: Payer: Medicare Other

## 2016-12-21 DIAGNOSIS — Z79899 Other long term (current) drug therapy: Secondary | ICD-10-CM | POA: Diagnosis not present

## 2016-12-21 DIAGNOSIS — F0390 Unspecified dementia without behavioral disturbance: Secondary | ICD-10-CM | POA: Diagnosis present

## 2016-12-21 DIAGNOSIS — R109 Unspecified abdominal pain: Secondary | ICD-10-CM | POA: Diagnosis not present

## 2016-12-21 DIAGNOSIS — K5100 Ulcerative (chronic) pancolitis without complications: Secondary | ICD-10-CM | POA: Diagnosis not present

## 2016-12-21 DIAGNOSIS — Z7982 Long term (current) use of aspirin: Secondary | ICD-10-CM

## 2016-12-21 DIAGNOSIS — E039 Hypothyroidism, unspecified: Secondary | ICD-10-CM | POA: Diagnosis present

## 2016-12-21 DIAGNOSIS — I2510 Atherosclerotic heart disease of native coronary artery without angina pectoris: Secondary | ICD-10-CM | POA: Diagnosis not present

## 2016-12-21 DIAGNOSIS — R2681 Unsteadiness on feet: Secondary | ICD-10-CM | POA: Diagnosis present

## 2016-12-21 DIAGNOSIS — E860 Dehydration: Secondary | ICD-10-CM | POA: Diagnosis present

## 2016-12-21 DIAGNOSIS — K922 Gastrointestinal hemorrhage, unspecified: Secondary | ICD-10-CM

## 2016-12-21 DIAGNOSIS — Z951 Presence of aortocoronary bypass graft: Secondary | ICD-10-CM

## 2016-12-21 DIAGNOSIS — A048 Other specified bacterial intestinal infections: Secondary | ICD-10-CM | POA: Diagnosis present

## 2016-12-21 DIAGNOSIS — K5730 Diverticulosis of large intestine without perforation or abscess without bleeding: Secondary | ICD-10-CM | POA: Diagnosis not present

## 2016-12-21 DIAGNOSIS — B9623 Unspecified Shiga toxin-producing Escherichia coli [E. coli] (STEC) as the cause of diseases classified elsewhere: Secondary | ICD-10-CM | POA: Diagnosis present

## 2016-12-21 DIAGNOSIS — Z853 Personal history of malignant neoplasm of breast: Secondary | ICD-10-CM | POA: Diagnosis not present

## 2016-12-21 DIAGNOSIS — R197 Diarrhea, unspecified: Secondary | ICD-10-CM | POA: Diagnosis not present

## 2016-12-21 DIAGNOSIS — Z8711 Personal history of peptic ulcer disease: Secondary | ICD-10-CM

## 2016-12-21 DIAGNOSIS — K219 Gastro-esophageal reflux disease without esophagitis: Secondary | ICD-10-CM | POA: Diagnosis present

## 2016-12-21 DIAGNOSIS — I10 Essential (primary) hypertension: Secondary | ICD-10-CM | POA: Diagnosis present

## 2016-12-21 DIAGNOSIS — M1991 Primary osteoarthritis, unspecified site: Secondary | ICD-10-CM | POA: Diagnosis not present

## 2016-12-21 DIAGNOSIS — D649 Anemia, unspecified: Secondary | ICD-10-CM | POA: Diagnosis not present

## 2016-12-21 DIAGNOSIS — Z79891 Long term (current) use of opiate analgesic: Secondary | ICD-10-CM | POA: Diagnosis not present

## 2016-12-21 DIAGNOSIS — E785 Hyperlipidemia, unspecified: Secondary | ICD-10-CM | POA: Diagnosis present

## 2016-12-21 DIAGNOSIS — M549 Dorsalgia, unspecified: Secondary | ICD-10-CM | POA: Diagnosis present

## 2016-12-21 DIAGNOSIS — G8929 Other chronic pain: Secondary | ICD-10-CM | POA: Diagnosis present

## 2016-12-21 DIAGNOSIS — R531 Weakness: Secondary | ICD-10-CM | POA: Diagnosis not present

## 2016-12-21 DIAGNOSIS — N390 Urinary tract infection, site not specified: Secondary | ICD-10-CM | POA: Diagnosis not present

## 2016-12-21 DIAGNOSIS — R1030 Lower abdominal pain, unspecified: Secondary | ICD-10-CM | POA: Diagnosis not present

## 2016-12-21 DIAGNOSIS — R1032 Left lower quadrant pain: Secondary | ICD-10-CM | POA: Diagnosis not present

## 2016-12-21 LAB — COMPREHENSIVE METABOLIC PANEL
ALK PHOS: 60 U/L (ref 38–126)
ALT: 53 U/L (ref 14–54)
AST: 46 U/L — ABNORMAL HIGH (ref 15–41)
Albumin: 3.3 g/dL — ABNORMAL LOW (ref 3.5–5.0)
Anion gap: 10 (ref 5–15)
BUN: 30 mg/dL — ABNORMAL HIGH (ref 6–20)
CALCIUM: 8.4 mg/dL — AB (ref 8.9–10.3)
CHLORIDE: 107 mmol/L (ref 101–111)
CO2: 23 mmol/L (ref 22–32)
Creatinine, Ser: 0.95 mg/dL (ref 0.44–1.00)
GFR, EST NON AFRICAN AMERICAN: 52 mL/min — AB (ref >60)
Glucose, Bld: 107 mg/dL — ABNORMAL HIGH (ref 65–99)
Potassium: 3.9 mmol/L (ref 3.5–5.1)
SODIUM: 140 mmol/L (ref 135–145)
Total Bilirubin: 0.5 mg/dL (ref 0.3–1.2)
Total Protein: 6.5 g/dL (ref 6.5–8.1)

## 2016-12-21 LAB — URINALYSIS, COMPLETE (UACMP) WITH MICROSCOPIC
BILIRUBIN URINE: NEGATIVE
Glucose, UA: NEGATIVE mg/dL
HGB URINE DIPSTICK: NEGATIVE
Ketones, ur: 5 mg/dL — AB
Leukocytes, UA: NEGATIVE
NITRITE: POSITIVE — AB
PROTEIN: NEGATIVE mg/dL
Specific Gravity, Urine: 1.036 — ABNORMAL HIGH (ref 1.005–1.030)
pH: 5 (ref 5.0–8.0)

## 2016-12-21 LAB — CBC WITH DIFFERENTIAL/PLATELET
BASOS ABS: 0.1 10*3/uL (ref 0–0.1)
Basophils Relative: 1 %
EOS PCT: 1 %
Eosinophils Absolute: 0.1 10*3/uL (ref 0–0.7)
HEMATOCRIT: 36.9 % (ref 35.0–47.0)
Hemoglobin: 12.8 g/dL (ref 12.0–16.0)
LYMPHS ABS: 2.4 10*3/uL (ref 1.0–3.6)
LYMPHS PCT: 19 %
MCH: 31 pg (ref 26.0–34.0)
MCHC: 34.7 g/dL (ref 32.0–36.0)
MCV: 89.3 fL (ref 80.0–100.0)
MONO ABS: 0.7 10*3/uL (ref 0.2–0.9)
Monocytes Relative: 6 %
NEUTROS ABS: 9.5 10*3/uL — AB (ref 1.4–6.5)
Neutrophils Relative %: 75 %
PLATELETS: 245 10*3/uL (ref 150–440)
RBC: 4.13 MIL/uL (ref 3.80–5.20)
RDW: 15.5 % — AB (ref 11.5–14.5)
WBC: 12.7 10*3/uL — ABNORMAL HIGH (ref 3.6–11.0)

## 2016-12-21 LAB — PROTIME-INR
INR: 1.12
Prothrombin Time: 14.5 seconds (ref 11.4–15.2)

## 2016-12-21 LAB — LIPASE, BLOOD: Lipase: 22 U/L (ref 11–51)

## 2016-12-21 LAB — C DIFFICILE QUICK SCREEN W PCR REFLEX
C DIFFICILE (CDIFF) TOXIN: NEGATIVE
C DIFFICLE (CDIFF) ANTIGEN: NEGATIVE
C Diff interpretation: NOT DETECTED

## 2016-12-21 MED ORDER — IOPAMIDOL (ISOVUE-300) INJECTION 61%: 15.0000 mL | INTRAVENOUS | Status: DC

## 2016-12-21 MED ORDER — METRONIDAZOLE IN NACL 5-0.79 MG/ML-% IV SOLN
500.0000 mg | Freq: Three times a day (TID) | INTRAVENOUS | Status: DC
  Administered 2016-12-21 – 2016-12-22 (×4): 500 mg via INTRAVENOUS
  Filled 2016-12-21 (×9): qty 100

## 2016-12-21 MED ORDER — FENTANYL 12 MCG/HR TD PT72
12.5000 ug | MEDICATED_PATCH | TRANSDERMAL | Status: DC
  Administered 2016-12-21: 12.5 ug via TRANSDERMAL
  Filled 2016-12-21: qty 1

## 2016-12-21 MED ORDER — SODIUM CHLORIDE 0.9 % IV BOLUS (SEPSIS)
1000.0000 mL | Freq: Once | INTRAVENOUS | Status: AC
  Administered 2016-12-21: 1000 mL via INTRAVENOUS

## 2016-12-21 MED ORDER — ACETAMINOPHEN 325 MG PO TABS
650.0000 mg | ORAL_TABLET | Freq: Four times a day (QID) | ORAL | Status: DC | PRN
  Administered 2016-12-21 – 2016-12-23 (×3): 650 mg via ORAL
  Filled 2016-12-21 (×4): qty 2

## 2016-12-21 MED ORDER — ONDANSETRON HCL 4 MG PO TABS: 4.0000 mg | ORAL_TABLET | Freq: Four times a day (QID) | ORAL | Status: DC | PRN

## 2016-12-21 MED ORDER — IOPAMIDOL (ISOVUE-370) INJECTION 76%
75.0000 mL | Freq: Once | INTRAVENOUS | Status: AC | PRN
  Administered 2016-12-21: 75 mL via INTRAVENOUS

## 2016-12-21 MED ORDER — TRAZODONE HCL 50 MG PO TABS: 25.0000 mg | ORAL_TABLET | Freq: Every evening | ORAL | Status: DC | PRN

## 2016-12-21 MED ORDER — LEVOTHYROXINE SODIUM 50 MCG PO TABS
50.0000 ug | ORAL_TABLET | Freq: Every day | ORAL | Status: DC
  Administered 2016-12-22 – 2016-12-24 (×3): 50 ug via ORAL
  Filled 2016-12-21 (×3): qty 1

## 2016-12-21 MED ORDER — HYDROCODONE-ACETAMINOPHEN 5-325 MG PO TABS
1.0000 | ORAL_TABLET | Freq: Four times a day (QID) | ORAL | Status: DC | PRN
  Filled 2016-12-21: qty 1

## 2016-12-21 MED ORDER — CIPROFLOXACIN IN D5W 400 MG/200ML IV SOLN
400.0000 mg | INTRAVENOUS | Status: DC
  Administered 2016-12-21 – 2016-12-22 (×2): 400 mg via INTRAVENOUS
  Filled 2016-12-21 (×4): qty 200

## 2016-12-21 MED ORDER — ATORVASTATIN CALCIUM 20 MG PO TABS
20.0000 mg | ORAL_TABLET | Freq: Every day | ORAL | Status: DC
  Administered 2016-12-23 – 2016-12-24 (×2): 20 mg via ORAL
  Filled 2016-12-21 (×3): qty 1

## 2016-12-21 MED ORDER — SODIUM CHLORIDE 0.9 % IV SOLN
INTRAVENOUS | Status: DC
  Administered 2016-12-21: 17:00:00 via INTRAVENOUS

## 2016-12-21 MED ORDER — AMITRIPTYLINE HCL 25 MG PO TABS
50.0000 mg | ORAL_TABLET | Freq: Every day | ORAL | Status: DC
  Administered 2016-12-21 – 2016-12-23 (×3): 50 mg via ORAL
  Filled 2016-12-21 (×5): qty 2

## 2016-12-21 MED ORDER — CITALOPRAM HYDROBROMIDE 20 MG PO TABS
20.0000 mg | ORAL_TABLET | Freq: Every day | ORAL | Status: DC
  Administered 2016-12-21 – 2016-12-24 (×4): 20 mg via ORAL
  Filled 2016-12-21 (×4): qty 1

## 2016-12-21 MED ORDER — ACETAMINOPHEN 650 MG RE SUPP: 650.0000 mg | Freq: Four times a day (QID) | RECTAL | Status: DC | PRN

## 2016-12-21 MED ORDER — PIPERACILLIN-TAZOBACTAM 3.375 G IVPB 30 MIN
3.3750 g | Freq: Once | INTRAVENOUS | Status: AC
  Administered 2016-12-21: 3.375 g via INTRAVENOUS
  Filled 2016-12-21: qty 50

## 2016-12-21 MED ORDER — ONDANSETRON HCL 4 MG/2ML IJ SOLN: 4.0000 mg | Freq: Four times a day (QID) | INTRAMUSCULAR | Status: DC | PRN

## 2016-12-21 NOTE — Progress Notes (Signed)
Pt blood pressures running high and has severe pain. Tylenol given. BP re-checked and BP is 122/60, pain has decreased with tylenol.

## 2016-12-21 NOTE — ED Notes (Signed)
Unable to obtain urine sample due to diarrhea. Admitting MD notified.

## 2016-12-21 NOTE — ED Notes (Signed)
Pharmacy called. Spoke to [place]. Notified of need of zosyn. States he will send it up.

## 2016-12-21 NOTE — Progress Notes (Signed)
ANTIBIOTIC CONSULT NOTE - INITIAL  Pharmacy Consult for Cipro Indication: intra-abdominal infection  Allergies  Allergen Reactions  . Alendronate Sodium Other (See Comments)    Reaction:  Restlessness   . Prednisone Other (See Comments)    Reaction:  Restlessness     Patient Measurements: Height: 5' 4.5" (163.8 cm) Weight: 102 lb (46.3 kg) IBW/kg (Calculated) : 55.85 Adjusted Body Weight:   Vital Signs: Temp: 98.5 F (36.9 C) (08/23 1318) Temp Source: Oral (08/23 1318) BP: 158/79 (08/23 1530) Pulse Rate: 88 (08/23 1530) Intake/Output from previous day: No intake/output data recorded. Intake/Output from this shift: No intake/output data recorded.  Labs:  Recent Labs  12/21/16 1328  WBC 12.7*  HGB 12.8  PLT 245  CREATININE 0.95   Estimated Creatinine Clearance: 29.9 mL/min (by C-G formula based on SCr of 0.95 mg/dL). No results for input(s): VANCOTROUGH, VANCOPEAK, VANCORANDOM, GENTTROUGH, GENTPEAK, GENTRANDOM, TOBRATROUGH, TOBRAPEAK, TOBRARND, AMIKACINPEAK, AMIKACINTROU, AMIKACIN in the last 72 hours.   Microbiology: No results found for this or any previous visit (from the past 720 hour(s)).  Medical History: Past Medical History:  Diagnosis Date  . Breast cancer ([place])   . CAD (coronary artery disease)   . Degenerative disc disease   . GERD (gastroesophageal reflux disease)   . Hyperlipidemia    Previous intolerance of statins  . Hypothyroidism   . Orthostatic hypotension   . Osteoarthritis   . Peptic ulcer disease   . Syncope     Medications:   (Not in a hospital admission) Scheduled:  . amitriptyline  50 mg Oral QHS  . atorvastatin  20 mg Oral Daily  . citalopram  20 mg Oral Daily  . fentaNYL  12.5 mcg Transdermal Q72H  . levothyroxine  50 mcg Oral Daily   Assessment: Pharmacy consulted to dose and monitor Cipro in this patient being treated for intra-abdominal infection. Patient previously had a dose of Zosyn in the ER and is also currently  on Metronidazole  Goal of Therapy:    Plan:  Will start Cipro 400 mg IV q24 hours.   Jocilyn Trego D 12/21/2016,4:43 PM

## 2016-12-21 NOTE — ED Notes (Signed)
Patient transported to CT 

## 2016-12-21 NOTE — ED Triage Notes (Signed)
Patient presents to the ED via EMS from home for abdominal pain and diarrhea.  Patient reports sharp lower abdominal pain and tenderness with diarrhea since Monday.  Patient is alert and oriented x 4.  Patient states she has a home Dietitian that comes to assist her daily.  Patient reports 4 episodes of diarrhea today.  Patient reports stools have been very dark.  Patient reports 1 episode of vomiting about 1 week ago.

## 2016-12-21 NOTE — H&P (Signed)
[facility] NAME: Angela Howe    MR#:  [id number]  DATE OF BIRTH:  [birth year]  DATE OF ADMISSION:  12/21/2016  PRIMARY CARE PHYSICIAN: Einar Pheasant, MD   REQUESTING/REFERRING PHYSICIAN: Dr. Joni Fears  CHIEF COMPLAINT: Diarrhea    Chief Complaint  Patient presents with  . Diarrhea  . Abdominal Pain    HISTORY OF PRESENT ILLNESS:  Angela Howe  is a 83 y.o. female with a known history of Essential hypertension, hypothyroidism presents to ED because of generalized abdominal pain for the last 3-4 days associated with multiple episodes of diarrhea for 3-4 days. Patient found to have guaiac-positive stools in the emergency room. patient had an episode of nausea, vomiting last night.  PAST MEDICAL HISTORY:   Past Medical History:  Diagnosis Date  . Breast cancer ([place])   . CAD (coronary artery disease)   . Degenerative disc disease   . GERD (gastroesophageal reflux disease)   . Hyperlipidemia    Previous intolerance of statins  . Hypothyroidism   . Orthostatic hypotension   . Osteoarthritis   . Peptic ulcer disease   . Syncope     PAST SURGICAL HISTOIRY:   Past Surgical History:  Procedure Laterality Date  . ABDOMINAL HYSTERECTOMY  1970  . APPENDECTOMY    . BIOPSY BREAST  2014  . BREAST EXCISIONAL BIOPSY Left 2014   neg  . BREAST EXCISIONAL BIOPSY Left 2006   postive radation  . BREAST LUMPECTOMY  2006  . CHOLECYSTECTOMY    . CORONARY ARTERY BYPASS GRAFT  05/2006   x3  . NASAL SINUS SURGERY    . ROTATOR CUFF REPAIR      SOCIAL HISTORY:   Social History  Substance Use Topics  . Smoking status: Never Smoker  . Smokeless tobacco: Never Used  . Alcohol use No    FAMILY HISTORY:   Family History  Problem Relation Age of Onset  . Heart attack Sister   . Cancer Brother   . Pancreatic cancer Brother   . Ovarian cancer Sister     DRUG ALLERGIES:   Allergies  Allergen Reactions  . Alendronate  Sodium Other (See Comments)    Reaction:  Restlessness   . Prednisone Other (See Comments)    Reaction:  Restlessness     REVIEW OF SYSTEMS:  CONSTITUTIONAL: No fever, fatigue or weakness.  EYES: No blurred or double vision.  EARS, NOSE, AND THROAT: No tinnitus or ear pain.  RESPIRATORY: No cough, shortness of breath, wheezing or hemoptysis.  CARDIOVASCULAR: No chest pain, orthopnea, edema.  GASTROINTESTINAL: Generalized abdominal pain, diarrhea for last 3-4 days.  GENITOURINARY: No dysuria, hematuria.  ENDOCRINE: No polyuria, nocturia,  HEMATOLOGY: No anemia, easy bruising or bleeding SKIN: No rash or lesion. MUSCULOSKELETAL: No joint pain or arthritis.   NEUROLOGIC: No tingling, numbness, weakness.  PSYCHIATRY: Dementia getting worse as per patient's daughter.   MEDICATIONS AT HOME:   Prior to Admission medications   Medication Sig Start Date End Date Taking? Authorizing Provider  amitriptyline (ELAVIL) 50 MG tablet TAKE 1 TABLET BY MOUTH AT BEDTIME 12/08/16  Yes Einar Pheasant, MD  aspirin EC 81 MG tablet Take 81 mg by mouth daily.   Yes [provider]  atorvastatin (LIPITOR) 20 MG tablet Take 1 tablet (20 mg total) by mouth daily. 08/08/16  Yes Einar Pheasant, MD  citalopram (CELEXA) 20 MG tablet Take 1 tablet (20 mg total) by mouth daily. 08/08/16  Yes Einar Pheasant,  MD  fentaNYL (DURAGESIC - DOSED MCG/HR) 12 MCG/HR Place 1 patch (12.5 mcg total) onto the skin every 3 (three) days. 10/13/15  Yes Wieting, Richard, MD  lansoprazole (PREVACID) 30 MG capsule Take 30 mg by mouth daily.     Yes [provider]  levothyroxine (SYNTHROID, LEVOTHROID) 50 MCG tablet Take 1 tablet (50 mcg total) by mouth daily. 08/08/16  Yes Einar Pheasant, MD  nitroGLYCERIN (NITROSTAT) 0.4 MG SL tablet Place 1 tablet (0.4 mg total) under the tongue every 5 (five) minutes as needed for chest pain. 07/10/14  Yes Einar Pheasant, MD  fludrocortisone (FLORINEF) 0.1 MG tablet Take 1 tablet  (0.1 mg total) by mouth daily. Patient not taking: Reported on 12/21/2016 03/22/15   Einar Pheasant, MD      VITAL SIGNS:  Blood pressure (!) 158/79, pulse 88, temperature 98.5 F (36.9 C), temperature source Oral, resp. rate 15, height 5' 4.5" (1.638 m), weight 46.3 kg (102 lb), SpO2 98 %.  PHYSICAL EXAMINATION:  GENERAL:  83 y.o.-year-old patient lying in the bed with no acute distress.  EYES: Pupils equal, round, reactive to light and accommodation. No scleral icterus. Extraocular muscles intact.  HEENT: Head atraumatic, normocephalic. Oropharynx and nasopharynx clear.  NECK:  Supple, no jugular venous distention. No thyroid enlargement, no tenderness.  LUNGS: Normal breath sounds bilaterally, no wheezing, rales,rhonchi or crepitation. No use of accessory muscles of respiration.  CARDIOVASCULAR: S1, S2 normal. No murmurs, rubs, or gallops.  ABDOMEN: Generalized abdominal tenderness present no rebound tenderness, bowel sounds present.no organo megaly. EXTREMITIES: No pedal edema, cyanosis, or clubbing.  NEUROLOGIC: Cranial nerves II through XII are intact. Muscle strength 5/5 in all extremities. Sensation intact. Gait not checked.  PSYCHIATRIC: The patient is alert and oriented x 3.  SKIN: No obvious rash, lesion, or ulcer.   LABORATORY PANEL:   CBC  Recent Labs Lab 12/21/16 1328  WBC 12.7*  HGB 12.8  HCT 36.9  PLT 245   ------------------------------------------------------------------------------------------------------------------  Chemistries   Recent Labs Lab 12/21/16 1328  NA 140  K 3.9  CL 107  CO2 23  GLUCOSE 107*  BUN 30*  CREATININE 0.95  CALCIUM 8.4*  AST 46*  ALT 53  ALKPHOS 60  BILITOT 0.5   ------------------------------------------------------------------------------------------------------------------  Cardiac Enzymes No results for input(s): TROPONINI in the last 168  hours. ------------------------------------------------------------------------------------------------------------------  RADIOLOGY:  Ct Angio Abd/pel W And/or Wo Contrast  Result Date: 12/21/2016 CLINICAL DATA:  Abdominal pain and diarrhea, GI bleed EXAM: CT ANGIOGRAPHY ABDOMEN AND PELVIS TECHNIQUE: Multidetector CT imaging of the abdomen and pelvis was performed using the standard protocol during bolus administration of intravenous contrast. Multiplanar reconstructed images including MIPs were obtained and reviewed to evaluate the vascular anatomy. CONTRAST:  75 cc Isovue 370 COMPARISON:  10/01/2015 FINDINGS: Arterial findings: Aorta: Heavy calcific atherosclerosis of the abdominal aorta with mild tortuosity. No significant aneurysm, dissection, or acute process. No retroperitoneal hemorrhage or hematoma. Celiac axis: Heavily calcified origin with ostial narrowing, estimated less than 50%. Celiac remains patent. Superior mesenteric: Heavily calcified origin with mild ostial narrowing, also less than 50%, remain patent. Left renal: Patent origin with heavy calcification. Mild luminal narrowing. No significant stenosis. Right renal: Patent origin and calcification. No significant stenosis. Mild proximal luminal narrowing. Inferior mesenteric: Heavily calcified origin but remains patent off of the distal aorta. Left iliac: Left common, internal and external iliac arteries remain patent. Mild tortuosity. Right iliac: Right common, internal and external iliac arteries are not sclerotic change over patent. Mild tortuosity. Venous findings:  Hepatic, portal, splenic, mesenteric, and renal veins are remain patent. IVC and iliac veins are patent. No veno-occlusive process or acute thrombus. Review of the MIP images confirms the above findings. Nonvascular findings: Lower chest: Minor basilar atelectasis versus scarring. Mild basilar hyperinflation. No pericardial pleural effusion. Normal heart size. Hepatobiliary:  Previous cholecystectomy. Stable mild biliary prominence. No focal hepatic abnormality or biliary dilatation. Pancreas: Mild atrophy. No acute inflammatory process or fluid collection. No ductal dilatation. Spleen:  Within normal limits in size.  No focal abnormality. Adrenals/urinary tract: Normal adrenal glands. Exophytic left renal cyst measures 2.5 cm as before. No obstructing urinary tract calculus. No hydroureter. Urinary bladder unremarkable. No focal renal abnormality or mass. Stomach/bowel: Moderate hiatal hernia noted. Negative for bowel obstruction, significant dilatation, ileus or free air. Diffuse wall thickening and edema of the colon compatible with acute pancolitis. Colon contains scattered areas of hyperdense intraluminal material which may be some residual contrast. No definite active arterial bleeding demonstrated by CTA within the GI tract. Sigmoid diverticulosis noted. Lymphatic:  No adenopathy Reproductive: Remote hysterectomy. No adnexal abnormality or mass trace pelvic free fluid likely related to the acute colitis. No fluid collection or abscess. No hematoma. Other: No inguinal abnormality or hernia. Intact abdominal wall. Indeterminate left breast cystic abnormality with peripheral calcifications, image 6 measures 17 mm. Consider correlation with mammogram. Musculoskeletal: Chronic compression fractures at T11, L3 and L4. Advanced degenerative changes of the lumbar spine. Bones are osteopenic. Lumbar facet arthropathy as well. No definite acute osseous finding. IMPRESSION: Diffuse colonic wall thickening and edema compatible with pancolitis. Sigmoid diverticulosis No significant active arterial bleeding demonstrated by CTA. Abdominal atherosclerosis without aneurysm Moderate hiatal hernia Remote cholecystectomy and hysterectomy Probable cystic peripherally calcified left breast abnormality. Recommend correlation with mammogram. Electronically Signed   By: Jerilynn Mages.  Shick M.D.   On: 12/21/2016  15:39    EKG:   Orders placed or performed in visit on 07/04/16  . EKG 12-Lead    IMPRESSION AND PLAN:  83 year old female patient with the diarrhea, generalized abdominal pain for last 3-4 days with CT evidence of pancolitis: Admitted to hospitalist service, start IV Cipro, Flagyl, IV hydration. Check the stool for C. difficile, GI panel. #2 guaiac-positive stools and upper GI bleed: Hemoglobin is stable at 12.8. Likely coming from pancolitis. Monitor the trend of hemoglobin and the consider GI consult if needed. #3 history of hypothyroidism: Continue Synthroid. #4 history of unstable gait, worsening dementia: Physical therapy consult , hd/w dauhghter 5. History of chronic back pain: Patient is on fentanyl patch for a long time. History of depression: Continue Celexa 20 mg daily. Amitriptyline 50 MG at bedtime. Hyperlipidemia: Continue atorvastatin 20 mg daily.  CODE STATUS discussed with patient and daughter and they want to be full code, but we will change the orders.  All the records are reviewed and case discussed with ED provider. Management plans discussed with the patient, family and they are in agreement.  CODE STATUS:full  TOTAL TIME TAKING CARE OF THIS PATIENT: 55 minutes.    Epifanio Lesches M.D on 12/21/2016 at 4:08 PM  Between 7am to 6pm - Pager - [phone]  After 6pm go to [email or web address] - password [email or web address] [facility]  Office  [phone]  CC: Primary care physician; Einar Pheasant, MD  Note: This dictation was prepared with Dragon dictation along with smaller phrase technology. Any transcriptional errors that result from this process are unintentional.

## 2016-12-21 NOTE — ED Provider Notes (Signed)
[facility] Emergency Department Provider Note  ____________________________________________  Time seen: Approximately 2:22 PM  I have reviewed the triage vital signs and the nursing notes.   HISTORY  Chief Complaint Diarrhea and Abdominal Pain    HPI Angela Howe is a 83 y.o. female who complains of lower abdominal pain for the past 2-3 days associated with diarrhea. Notes that her stool has been black and runny during this time.. She also has had one episode of nonbloody vomiting. Pain is nonradiating, moderate intensity, sharp, no aggravating or alleviating factors. Denies fever chills sweats back pain or chest pain.     Past Medical History:  Diagnosis Date  . Breast cancer ([place])   . CAD (coronary artery disease)   . Degenerative disc disease   . GERD (gastroesophageal reflux disease)   . Hyperlipidemia    Previous intolerance of statins  . Hypothyroidism   . Orthostatic hypotension   . Osteoarthritis   . Peptic ulcer disease   . Syncope      Patient Active Problem List   Diagnosis Date Noted  . Unsteady gait 08/13/2016  . Falls frequently 07/04/2016  . Chest pain 10/12/2015  . Rib fracture 10/03/2015  . Right carotid bruit 08/01/2015  . Loss of weight 01/12/2015  . Anemia 07/18/2014  . Health care maintenance 07/18/2014  . Hoarseness 07/18/2014  . Abdominal pain 05/05/2014  . Back pain 10/05/2013  . Knee pain 07/14/2013  . Oral pain 05/06/2013  . Itching 01/05/2013  . History of breast cancer 11/20/2012  . Syncope 11/20/2012  . Hypothyroidism 08/10/2012  . Difficulty sleeping 08/10/2012  . Dizziness 11/08/2010  . Hyperlipemia 03/23/2009  . HYPERTENSION, BENIGN 03/23/2009  . CAD, NATIVE VESSEL 09/24/2008  . ORTHOSTATIC HYPOTENSION 09/24/2008  . ABNORMAL EKG 09/24/2008     Past Surgical History:  Procedure Laterality Date  . ABDOMINAL HYSTERECTOMY  1970  . APPENDECTOMY    . BIOPSY BREAST  2014  . BREAST  EXCISIONAL BIOPSY Left 2014   neg  . BREAST EXCISIONAL BIOPSY Left 2006   postive radation  . BREAST LUMPECTOMY  2006  . CHOLECYSTECTOMY    . CORONARY ARTERY BYPASS GRAFT  05/2006   x3  . NASAL SINUS SURGERY    . ROTATOR CUFF REPAIR       Prior to Admission medications   Medication Sig Start Date End Date Taking? Authorizing Provider  amitriptyline (ELAVIL) 50 MG tablet TAKE 1 TABLET BY MOUTH AT BEDTIME 12/08/16   Einar Pheasant, MD  aspirin EC 81 MG tablet Take 81 mg by mouth daily.    [provider]  atorvastatin (LIPITOR) 20 MG tablet Take 1 tablet (20 mg total) by mouth daily. 08/08/16   Einar Pheasant, MD  citalopram (CELEXA) 20 MG tablet Take 1 tablet (20 mg total) by mouth daily. 08/08/16   Einar Pheasant, MD  fentaNYL (DURAGESIC - DOSED MCG/HR) 12 MCG/HR Place 1 patch (12.5 mcg total) onto the skin every 3 (three) days. 10/13/15   Loletha Grayer, MD  fludrocortisone (FLORINEF) 0.1 MG tablet Take 1 tablet (0.1 mg total) by mouth daily. 03/22/15   Einar Pheasant, MD  lansoprazole (PREVACID) 30 MG capsule Take 30 mg by mouth daily.      [provider]  levothyroxine (SYNTHROID, LEVOTHROID) 50 MCG tablet Take 1 tablet (50 mcg total) by mouth daily. 08/08/16   Einar Pheasant, MD  nitroGLYCERIN (NITROSTAT) 0.4 MG SL tablet Place 1 tablet (0.4 mg total) under the tongue every 5 (five) minutes  as needed for chest pain. 07/10/14   Einar Pheasant, MD     Allergies Alendronate sodium and Prednisone   Family History  Problem Relation Age of Onset  . Heart attack Sister   . Cancer Brother   . Pancreatic cancer Brother   . Ovarian cancer Sister     Social History Social History  Substance Use Topics  . Smoking status: Never Smoker  . Smokeless tobacco: Never Used  . Alcohol use No    Review of Systems  Constitutional:   No fever or chills.  ENT:   No sore throat. No rhinorrhea. Cardiovascular:   No chest pain or syncope. Respiratory:   No dyspnea or  cough. Gastrointestinal:   Positive as above for abdominal pain and diarrhea..  Musculoskeletal:   Negative for focal pain or swelling All other systems reviewed and are negative except as documented above in ROS and HPI.  ____________________________________________   PHYSICAL EXAM:  VITAL SIGNS: ED Triage Vitals  Enc Vitals Group     BP 12/21/16 1318 (!) 159/78     Pulse Rate 12/21/16 1318 96     Resp 12/21/16 1318 16     Temp 12/21/16 1318 98.5 F (36.9 C)     Temp Source 12/21/16 1318 Oral     SpO2 12/21/16 1318 96 %     Weight 12/21/16 1319 102 lb (46.3 kg)     Height 12/21/16 1319 5' 4.5" (1.638 m)     Head Circumference --      Peak Flow --      Pain Score 12/21/16 1318 9     Pain Loc --      Pain Edu? --      Excl. in [place]? --     Vital signs reviewed, nursing assessments reviewed.   Constitutional:   Alert and oriented. Well appearing and in no distress. Eyes:   No scleral icterus.  EOMI. No nystagmus. No conjunctival pallor. PERRL. ENT   Head:   Normocephalic and atraumatic.   Nose:   No congestion/rhinnorhea.    Mouth/Throat:   MMM, no pharyngeal erythema. No peritonsillar mass.    Neck:   No meningismus. Full ROM Hematological/Lymphatic/Immunilogical:   No cervical lymphadenopathy. Cardiovascular:   RRR. Symmetric bilateral radial and DP pulses.  No murmurs.  Respiratory:   Normal respiratory effort without tachypnea/retractions. Breath sounds are clear and equal bilaterally. No wheezes/rales/rhonchi. Gastrointestinal:   Soft With left lower quadrant tenderness. Non distended. There is no CVA tenderness.  No rebound, rigidity, or guarding. No pulsatile mass or bruit. Rectal exam reveals melanotic stool, strongly Hemoccult-positive. Controls okay. Genitourinary:   deferred Musculoskeletal:   Normal range of motion in all extremities. No joint effusions.  No lower extremity tenderness.  No edema. Neurologic:   Normal speech and language.  Motor  grossly intact. No gross focal neurologic deficits are appreciated.  Skin:    Skin is warm, dry and intact. No rash noted.  No petechiae, purpura, or bullae.  ____________________________________________    LABS (pertinent positives/negatives) (all labs ordered are listed, but only abnormal results are displayed) Labs Reviewed  COMPREHENSIVE METABOLIC PANEL - Abnormal; Notable for the following:       Result Value   Glucose, Bld 107 (*)    BUN 30 (*)    Calcium 8.4 (*)    Albumin 3.3 (*)    AST 46 (*)    GFR calc non Af Amer 52 (*)    All other components within normal limits  CBC WITH DIFFERENTIAL/PLATELET - Abnormal; Notable for the following:    WBC 12.7 (*)    RDW 15.5 (*)    Neutro Abs 9.5 (*)    All other components within normal limits  URINE CULTURE  LIPASE, BLOOD  PROTIME-INR  URINALYSIS, COMPLETE (UACMP) WITH MICROSCOPIC   ____________________________________________   EKG    ____________________________________________    RADIOLOGY  No results found.  ____________________________________________   PROCEDURES Procedures  ____________________________________________   INITIAL IMPRESSION / ASSESSMENT AND PLAN / ED COURSE  Pertinent labs & imaging results that were available during my care of the patient were reviewed by me and considered in my medical decision making (see chart for details).    Clinical Course as of Dec 21 1420  Thu Dec 21, 2016  1329 Pt p/w abd pain concerning for diverticulitis, found to have melanotic stool.  Given need for IV contrast enhanced CT to eval for complications of diverticulitis, will perform CTA abd/pelvis to also possibly localize bleeding source.  [PS]    Clinical Course User Index [PS] Carrie Mew, MD    ----------------------------------------- 3:39 PM on 12/21/2016 -----------------------------------------  Still awaiting read from CT angiogram. Case discussed with the hospitalist for further  management. My review of the CT images shows prominent diverticular disease and IV contrast extravasated into the rectum. Clinically the patient has diverticulitis. Zosyn ordered.   ____________________________________________   FINAL CLINICAL IMPRESSION(S) / ED DIAGNOSES  Final diagnoses:  Acute GI bleeding  Lower abdominal pain      New Prescriptions   No medications on file     Portions of this note were generated with dragon dictation software. Dictation errors may occur despite best attempts at proofreading.    Carrie Mew, MD 12/21/16 1539

## 2016-12-22 ENCOUNTER — Telehealth: Payer: Self-pay | Admitting: Internal Medicine

## 2016-12-22 LAB — BASIC METABOLIC PANEL
Anion gap: 5 (ref 5–15)
BUN: 22 mg/dL — AB (ref 6–20)
CALCIUM: 7.6 mg/dL — AB (ref 8.9–10.3)
CO2: 24 mmol/L (ref 22–32)
CREATININE: 0.91 mg/dL (ref 0.44–1.00)
Chloride: 112 mmol/L — ABNORMAL HIGH (ref 101–111)
GFR calc Af Amer: >60 mL/min (ref >60)
GFR, EST NON AFRICAN AMERICAN: 55 mL/min — AB (ref >60)
Glucose, Bld: 91 mg/dL (ref 65–99)
POTASSIUM: 4 mmol/L (ref 3.5–5.1)
SODIUM: 141 mmol/L (ref 135–145)

## 2016-12-22 LAB — GASTROINTESTINAL PANEL BY PCR, STOOL (REPLACES STOOL CULTURE)
ADENOVIRUS F40/41: NOT DETECTED
Astrovirus: NOT DETECTED
CRYPTOSPORIDIUM: NOT DETECTED
CYCLOSPORA CAYETANENSIS: NOT DETECTED
Campylobacter species: NOT DETECTED
E. coli O157: NOT DETECTED
ENTAMOEBA HISTOLYTICA: NOT DETECTED
ENTEROAGGREGATIVE E COLI (EAEC): NOT DETECTED
Enterotoxigenic E coli (ETEC): NOT DETECTED
GIARDIA LAMBLIA: NOT DETECTED
Norovirus GI/GII: NOT DETECTED
Plesimonas shigelloides: NOT DETECTED
ROTAVIRUS A: NOT DETECTED
SALMONELLA SPECIES: NOT DETECTED
SHIGELLA/ENTEROINVASIVE E COLI (EIEC): NOT DETECTED
Sapovirus (I, II, IV, and V): NOT DETECTED
Shiga like toxin producing E coli (STEC): DETECTED — AB
VIBRIO SPECIES: NOT DETECTED
Vibrio cholerae: NOT DETECTED
YERSINIA ENTEROCOLITICA: NOT DETECTED

## 2016-12-22 LAB — CBC
HEMATOCRIT: 30.5 % — AB (ref 35.0–47.0)
Hemoglobin: 10.6 g/dL — ABNORMAL LOW (ref 12.0–16.0)
MCH: 30.7 pg (ref 26.0–34.0)
MCHC: 34.6 g/dL (ref 32.0–36.0)
MCV: 88.5 fL (ref 80.0–100.0)
Platelets: 197 10*3/uL (ref 150–440)
RBC: 3.45 MIL/uL — ABNORMAL LOW (ref 3.80–5.20)
RDW: 15.4 % — AB (ref 11.5–14.5)
WBC: 9.8 10*3/uL (ref 3.6–11.0)

## 2016-12-22 LAB — GLUCOSE, CAPILLARY
GLUCOSE-CAPILLARY: 110 mg/dL — AB (ref 65–99)
Glucose-Capillary: 73 mg/dL (ref 65–99)

## 2016-12-22 MED ORDER — ALUM & MAG HYDROXIDE-SIMETH 200-200-20 MG/5ML PO SUSP: 15.0000 mL | Freq: Four times a day (QID) | ORAL | Status: DC | PRN

## 2016-12-22 MED ORDER — BOOST / RESOURCE BREEZE PO LIQD
1.0000 | Freq: Three times a day (TID) | ORAL | Status: DC
  Administered 2016-12-22 – 2016-12-24 (×5): 1 via ORAL

## 2016-12-22 MED ORDER — OXYCODONE-ACETAMINOPHEN 5-325 MG PO TABS: 1.0000 | ORAL_TABLET | Freq: Four times a day (QID) | ORAL | Status: DC | PRN

## 2016-12-22 NOTE — Telephone Encounter (Signed)
Called pt to schedule AWV. No answer, No voicemail.  No hx of AWV  Ebony Hail call back [phone]

## 2016-12-22 NOTE — Progress Notes (Signed)
Initial Nutrition Assessment  DOCUMENTATION CODES:   Severe malnutrition in context of chronic illness  INTERVENTION:  1. Boost Breeze po TID, each supplement provides 250 kcal and 9 grams of protein 2. Discussed high protein, high calorie nutrition therapy with patient along with possible food sources.  NUTRITION DIAGNOSIS:   Malnutrition related to chronic illness (Poor appetite) as evidenced by severe depletion of body fat, severe depletion of muscle mass.  GOAL:   Patient will meet greater than or equal to 90% of their needs  MONITOR:   PO intake, I & O's, Labs, Supplement acceptance, Weight trends  REASON FOR ASSESSMENT:   Malnutrition Screening Tool    ASSESSMENT:   Angela Howe  is a 83 y.o. female with a known history of Essential hypertension, hypothyroidism presents to ED because of generalized abdominal pain for the last 3-4 days associated with multiple episodes of diarrhea for 3-4 days.  Patient has a hx of breast cancer. PO intake has been poor, patient reports a usual weight of 138 lbs now 101 lbs. Reports this weight loss has occurred over the last year, but unable to corroborate this information with the chart. Normally eats a breakfast bar or cereal for breakfast, a sandwich and soup for lunch and maybe a sandwich for dinner. Likely not meeting her needs as an outpatient.  Labs reviewed  Medications reviewed and include: NS at 31mL/hr  Nutrition-Focused physical exam completed. Findings are severe fat depletion, severe muscle depletion, and no edema.   Diet Order:  Diet full liquid Room service appropriate? Yes; Fluid consistency: Thin  Skin:  Reviewed, no issues  Last BM:  12/21/2016 (Type 6)  Height:   Ht Readings from Last 1 Encounters:  12/21/16 5' 4.5" (1.638 m)    Weight:   Wt Readings from Last 1 Encounters:  12/22/16 101 lb 14.4 oz (46.2 kg)    Ideal Body Weight:  55.68 kg  BMI:  Body mass index is 17.22 kg/m.  Estimated  Nutritional Needs:   Kcal:  1200-1400 calories  Protein:  69-78 grams   Fluid:  > 1.5L  EDUCATION NEEDS:   No education needs identified at this time  Satira Anis. [name], MS, RD LDN Inpatient Clinical Dietitian Pager [phone]

## 2016-12-22 NOTE — Progress Notes (Addendum)
ANTIBIOTIC CONSULT NOTE - FOLLOW UP   Pharmacy Consult for Cipro Indication: intra-abdominal infection  Allergies  Allergen Reactions  . Alendronate Sodium Other (See Comments)    Reaction:  Restlessness   . Prednisone Other (See Comments)    Reaction:  Restlessness     Patient Measurements: Height: 5' 4.5" (163.8 cm) Weight: 101 lb 14.4 oz (46.2 kg) IBW/kg (Calculated) : 55.85 Adjusted Body Weight:   Vital Signs: Temp: 98.8 F (37.1 C) (08/24 0745) Temp Source: Oral (08/24 0745) BP: 146/76 (08/24 0745) Pulse Rate: 84 (08/24 0745) Intake/Output from previous day: 08/23 0701 - 08/24 0700 In: 1047.5 [I.V.:647.5; IV Piggyback:400] Out: 500 [Urine:500] Intake/Output from this shift: No intake/output data recorded.  Labs:  Recent Labs  12/21/16 1328 12/22/16 0248  WBC 12.7* 9.8  HGB 12.8 10.6*  PLT 245 197  CREATININE 0.95 0.91   Estimated Creatinine Clearance: 31.2 mL/min (by C-G formula based on SCr of 0.91 mg/dL). No results for input(s): VANCOTROUGH, VANCOPEAK, VANCORANDOM, GENTTROUGH, GENTPEAK, GENTRANDOM, TOBRATROUGH, TOBRAPEAK, TOBRARND, AMIKACINPEAK, AMIKACINTROU, AMIKACIN in the last 72 hours.   Microbiology: Recent Results (from the past 720 hour(s))  C difficile quick scan w PCR reflex     Status: None   Collection Time: 12/21/16  3:56 PM  Result Value Ref Range Status   C Diff antigen NEGATIVE NEGATIVE Final   C Diff toxin NEGATIVE NEGATIVE Final   C Diff interpretation No C. difficile detected.  Final    Medical History: Past Medical History:  Diagnosis Date  . Breast cancer ([place])   . CAD (coronary artery disease)   . Degenerative disc disease   . GERD (gastroesophageal reflux disease)   . Hyperlipidemia    Previous intolerance of statins  . Hypothyroidism   . Orthostatic hypotension   . Osteoarthritis   . Peptic ulcer disease   . Syncope     Medications:  Prescriptions Prior to Admission  Medication Sig Dispense Refill Last Dose  .  amitriptyline (ELAVIL) 50 MG tablet TAKE 1 TABLET BY MOUTH AT BEDTIME 90 tablet 0 12/20/2016 at 2000  . aspirin EC 81 MG tablet Take 81 mg by mouth daily.   12/21/2016 at 0800  . atorvastatin (LIPITOR) 20 MG tablet Take 1 tablet (20 mg total) by mouth daily. 90 tablet 1 12/20/2016 at 2000  . citalopram (CELEXA) 20 MG tablet Take 1 tablet (20 mg total) by mouth daily. 90 tablet 1 12/20/2016 at 2000  . fentaNYL (DURAGESIC - DOSED MCG/HR) 12 MCG/HR Place 1 patch (12.5 mcg total) onto the skin every 3 (three) days. 5 patch 0 12/18/2016 at 0800  . lansoprazole (PREVACID) 30 MG capsule Take 30 mg by mouth daily.     12/20/2016 at 2000  . levothyroxine (SYNTHROID, LEVOTHROID) 50 MCG tablet Take 1 tablet (50 mcg total) by mouth daily. 90 tablet 1 12/21/2016 at 0700  . nitroGLYCERIN (NITROSTAT) 0.4 MG SL tablet Place 1 tablet (0.4 mg total) under the tongue every 5 (five) minutes as needed for chest pain. 25 tablet 0 PRN at PRN  . fludrocortisone (FLORINEF) 0.1 MG tablet Take 1 tablet (0.1 mg total) by mouth daily. (Patient not taking: Reported on 12/21/2016) 90 tablet 3 Not Taking at Unknown time   Scheduled:  . amitriptyline  50 mg Oral QHS  . atorvastatin  20 mg Oral Daily  . citalopram  20 mg Oral Daily  . feeding supplement  1 Container Oral TID BM  . fentaNYL  12.5 mcg Transdermal Q72H  . levothyroxine  50 mcg Oral QAC breakfast   Assessment: Pharmacy consulted to dose and monitor Cipro in this patient being treated for intra-abdominal infection. Patient also currently on Metronidazole. Goal of Therapy:    Plan:  Patient renal function is borderline for q12 hour dosing; however will continue Cipro 400 mg IV q24 hours.   Angello Chien D 12/22/2016,2:54 PM

## 2016-12-22 NOTE — Progress Notes (Signed)
[place] at [place] NAME: Angela Howe    MR#:  [id number]  DATE OF BIRTH:  02-Apr-1926  SUBJECTIVE:  CHIEF COMPLAINT:   Chief Complaint  Patient presents with  . Diarrhea  . Abdominal Pain   Diffuse abdominal pain, no nausea vomiting, but had diarrhea once today. REVIEW OF SYSTEMS:  Review of Systems  Constitutional: Positive for malaise/fatigue. Negative for chills and fever.  HENT: Negative for sore throat.   Eyes: Negative for blurred vision and double vision.  Respiratory: Negative for cough, hemoptysis, shortness of breath, wheezing and stridor.   Cardiovascular: Negative for chest pain, palpitations, orthopnea and leg swelling.  Gastrointestinal: Positive for abdominal pain and diarrhea. Negative for blood in stool, melena, nausea and vomiting.  Genitourinary: Negative for dysuria, flank pain and hematuria.  Musculoskeletal: Negative for back pain and joint pain.  Neurological: Positive for weakness. Negative for dizziness, sensory change, focal weakness, seizures, loss of consciousness and headaches.  Endo/Heme/Allergies: Negative for polydipsia.  Psychiatric/Behavioral: Negative for depression. The patient is not nervous/anxious.     DRUG ALLERGIES:   Allergies  Allergen Reactions  . Alendronate Sodium Other (See Comments)    Reaction:  Restlessness   . Prednisone Other (See Comments)    Reaction:  Restlessness    VITALS:  Blood pressure 128/62, pulse 81, temperature 98.3 F (36.8 C), temperature source Oral, resp. rate 16, height 5' 4.5" (1.638 m), weight 101 lb 14.4 oz (46.2 kg), SpO2 98 %. PHYSICAL EXAMINATION:  Physical Exam  Constitutional: She is oriented to person, place, and time and well-developed, well-nourished, and in no distress.  HENT:  Head: Normocephalic.  Mouth/Throat: Oropharynx is clear and moist.  Eyes: Pupils are equal, round, and reactive to light. Conjunctivae and EOM are normal. No scleral  icterus.  Neck: Normal range of motion. Neck supple. No JVD present. No tracheal deviation present.  Cardiovascular: Normal rate, regular rhythm and normal heart sounds.  Exam reveals no gallop.   No murmur heard. Pulmonary/Chest: Effort normal and breath sounds normal. No respiratory distress. She has no wheezes. She has no rales.  Abdominal: Soft. Bowel sounds are normal. She exhibits no distension. There is tenderness. There is no rebound.  Musculoskeletal: Normal range of motion. She exhibits no edema or tenderness.  Neurological: She is alert and oriented to person, place, and time. No cranial nerve deficit.  Skin: No rash noted. No erythema.  Psychiatric: Affect normal.   LABORATORY PANEL:  Female CBC  Recent Labs Lab 12/22/16 0248  WBC 9.8  HGB 10.6*  HCT 30.5*  PLT 197   ------------------------------------------------------------------------------------------------------------------ Chemistries   Recent Labs Lab 12/21/16 1328 12/22/16 0248  NA 140 141  K 3.9 4.0  CL 107 112*  CO2 23 24  GLUCOSE 107* 91  BUN 30* 22*  CREATININE 0.95 0.91  CALCIUM 8.4* 7.6*  AST 46*  --   ALT 53  --   ALKPHOS 60  --   BILITOT 0.5  --    RADIOLOGY:  No results found. ASSESSMENT AND PLAN:   83 year old female patient with the diarrhea, generalized abdominal pain for last 3-4 days with CT evidence of pancolitis  #1. Pancolitis with leukocytosis. Continue IV Cipro, Flagyl, IV hydration.  negative C. Difficile. But GI panel showed Shiga like toxin producing E coli (STEC). Leukocytosis improved.  * UTI. Continue Cipro, U/C: >=100,000 COLONIES/mL ENTEROBACTER AEROGENES  follow-up sensitivity.  #2  Occult GI bleeding with guaiac-positive stools: Likely coming from pancolitis.  Hemoglobin decreased to 10.6 today. GI consult and follow-up hemoglobin  #3 history of hypothyroidism: Continue Synthroid. #4 history of unstable gait, worsening dementia: Physical therapy consult.  5.  History of chronic back pain: Patient is on fentanyl patch for a long time. History of depression: Continue Celexa 20 mg daily. Amitriptyline 50 MG at bedtime. Hyperlipidemia: Continue atorvastatin 20 mg daily.  Dehydration. Improving with IV fluid support  All the records are reviewed and case discussed with Care Management/Social Worker. Management plans discussed with the patient, family and they are in agreement.  CODE STATUS: Full Code  TOTAL TIME TAKING CARE OF THIS PATIENT: 36 minutes.   More than 50% of the time was spent in counseling/coordination of care: YES  POSSIBLE D/C IN 2 DAYS, DEPENDING ON CLINICAL CONDITION.   Demetrios Loll M.D on 12/22/2016 at 5:12 PM  Between 7am to 6pm - Pager - [phone]  After 6pm go to [email or web address] - Proofreader  [facility]  Office  [phone]  CC: Primary care physician; Einar Pheasant, MD  Note: This dictation was prepared with Dragon dictation along with smaller phrase technology. Any transcriptional errors that result from this process are unintentional.

## 2016-12-23 LAB — URINE CULTURE

## 2016-12-23 LAB — GLUCOSE, CAPILLARY: GLUCOSE-CAPILLARY: 84 mg/dL (ref 65–99)

## 2016-12-23 LAB — HEMOGLOBIN: Hemoglobin: 11.5 g/dL — ABNORMAL LOW (ref 12.0–16.0)

## 2016-12-23 NOTE — Progress Notes (Signed)
A&Ox4. VSS. Pt has been continent of bowels for >24 hours. Wears a pull up at baseline at home. Enteric precautions removed.

## 2016-12-23 NOTE — Consult Note (Signed)
Referring Provider: Dr. Bridgett Larsson Primary Care Physician:  Einar Pheasant, MD Primary Gastroenterologist:  Althia Forts  Reason for Consultation:  Diarrhea; Colitis  HPI: Angela Howe is a 83 y.o. female with acute onset of profuse watery stools since early last week that has decreased in frequency to two so far today. Stools remain loose. Denies rectal bleeding. Lower abdominal pain last week. CT showed pancolitis and GI pathogen panel positive for Shiga-like toxin producing E. Coli (STEC). One episode of nonbloody vomiting prior to admit. Eating solid food for lunch right now and feels ok. Daughter and granddaughters in room.  Past Medical History:  Diagnosis Date  . Breast cancer ([place])   . CAD (coronary artery disease)   . Degenerative disc disease   . GERD (gastroesophageal reflux disease)   . Hyperlipidemia    Previous intolerance of statins  . Hypothyroidism   . Orthostatic hypotension   . Osteoarthritis   . Peptic ulcer disease   . Syncope     Past Surgical History:  Procedure Laterality Date  . ABDOMINAL HYSTERECTOMY  1970  . APPENDECTOMY    . BIOPSY BREAST  2014  . BREAST EXCISIONAL BIOPSY Left 2014   neg  . BREAST EXCISIONAL BIOPSY Left 2006   postive radation  . BREAST LUMPECTOMY  2006  . CHOLECYSTECTOMY    . CORONARY ARTERY BYPASS GRAFT  05/2006   x3  . NASAL SINUS SURGERY    . ROTATOR CUFF REPAIR      Prior to Admission medications   Medication Sig Start Date End Date Taking? Authorizing Provider  amitriptyline (ELAVIL) 50 MG tablet TAKE 1 TABLET BY MOUTH AT BEDTIME 12/08/16  Yes Einar Pheasant, MD  aspirin EC 81 MG tablet Take 81 mg by mouth daily.   Yes [provider]  atorvastatin (LIPITOR) 20 MG tablet Take 1 tablet (20 mg total) by mouth daily. 08/08/16  Yes Einar Pheasant, MD  citalopram (CELEXA) 20 MG tablet Take 1 tablet (20 mg total) by mouth daily. 08/08/16  Yes Einar Pheasant, MD  fentaNYL (DURAGESIC - DOSED MCG/HR) 12 MCG/HR Place 1  patch (12.5 mcg total) onto the skin every 3 (three) days. 10/13/15  Yes Wieting, Richard, MD  lansoprazole (PREVACID) 30 MG capsule Take 30 mg by mouth daily.     Yes [provider]  levothyroxine (SYNTHROID, LEVOTHROID) 50 MCG tablet Take 1 tablet (50 mcg total) by mouth daily. 08/08/16  Yes Einar Pheasant, MD  nitroGLYCERIN (NITROSTAT) 0.4 MG SL tablet Place 1 tablet (0.4 mg total) under the tongue every 5 (five) minutes as needed for chest pain. 07/10/14  Yes Einar Pheasant, MD  fludrocortisone (FLORINEF) 0.1 MG tablet Take 1 tablet (0.1 mg total) by mouth daily. Patient not taking: Reported on 12/21/2016 03/22/15   Einar Pheasant, MD    Scheduled Meds: . amitriptyline  50 mg Oral QHS  . atorvastatin  20 mg Oral Daily  . citalopram  20 mg Oral Daily  . feeding supplement  1 Container Oral TID BM  . fentaNYL  12.5 mcg Transdermal Q72H  . levothyroxine  50 mcg Oral QAC breakfast   Continuous Infusions: . ciprofloxacin 400 mg (12/22/16 1747)   PRN Meds:.acetaminophen **OR** acetaminophen, alum & mag hydroxide-simeth, HYDROcodone-acetaminophen, ondansetron **OR** ondansetron (ZOFRAN) IV, oxyCODONE-acetaminophen, traZODone  Allergies as of 12/21/2016 - Review Complete 12/21/2016  Allergen Reaction Noted  . Alendronate sodium Other (See Comments)   . Prednisone Other (See Comments) 07/28/2013    Family History  Problem Relation Age of Onset  .  Heart attack Sister   . Cancer Brother   . Pancreatic cancer Brother   . Ovarian cancer Sister     Social History   Social History  . Marital status: Widowed    Spouse name: N/A  . Number of children: N/A  . Years of education: N/A   Occupational History  . Retired Retired   Social History Main Topics  . Smoking status: Never Smoker  . Smokeless tobacco: Never Used  . Alcohol use No  . Drug use: No  . Sexual activity: Not on file   Other Topics Concern  . Not on file   Social History Narrative   Widowed   Gets  regular exercise    Review of Systems: All negative except as stated above in HPI.  Physical Exam: Vital signs: Vitals:   12/22/16 2021 12/23/16 0804  BP: 140/67 (!) 170/71  Pulse: 81 79  Resp: 18   Temp: 98.1 F (36.7 C) 98.5 F (36.9 C)  SpO2: 96% 99%   Last BM Date: 12/22/16 General:   Elderly, frail, thin, lethargic, pleasant and cooperative in NAD HEENT: anicteric sclera, oropharynx clear Neck: supple, nontender Lungs:  Clear throughout to auscultation.   No wheezes, crackles, or rhonchi. No acute distress. Heart:  Regular rate and rhythm; no murmurs, clicks, rubs,  or gallops. Abdomen: diffuse tenderness with guarding, soft, nondistended, +BS  Rectal:  Deferred Ext: no edema  GI:  Lab Results:  Recent Labs  12/21/16 1328 12/22/16 0248 12/23/16 0723  WBC 12.7* 9.8  --   HGB 12.8 10.6* 11.5*  HCT 36.9 30.5*  --   PLT 245 197  --    BMET  Recent Labs  12/21/16 1328 12/22/16 0248  NA 140 141  K 3.9 4.0  CL 107 112*  CO2 23 24  GLUCOSE 107* 91  BUN 30* 22*  CREATININE 0.95 0.91  CALCIUM 8.4* 7.6*   LFT  Recent Labs  12/21/16 1328  PROT 6.5  ALBUMIN 3.3*  AST 46*  ALT 53  ALKPHOS 60  BILITOT 0.5   PT/INR  Recent Labs  12/21/16 1328  LABPROT 14.5  INR 1.12     Studies/Results: Ct Angio Abd/pel W And/or Wo Contrast  Result Date: 12/21/2016 CLINICAL DATA:  Abdominal pain and diarrhea, GI bleed EXAM: CT ANGIOGRAPHY ABDOMEN AND PELVIS TECHNIQUE: Multidetector CT imaging of the abdomen and pelvis was performed using the standard protocol during bolus administration of intravenous contrast. Multiplanar reconstructed images including MIPs were obtained and reviewed to evaluate the vascular anatomy. CONTRAST:  75 cc Isovue 370 COMPARISON:  10/01/2015 FINDINGS: Arterial findings: Aorta: Heavy calcific atherosclerosis of the abdominal aorta with mild tortuosity. No significant aneurysm, dissection, or acute process. No retroperitoneal hemorrhage or  hematoma. Celiac axis: Heavily calcified origin with ostial narrowing, estimated less than 50%. Celiac remains patent. Superior mesenteric: Heavily calcified origin with mild ostial narrowing, also less than 50%, remain patent. Left renal: Patent origin with heavy calcification. Mild luminal narrowing. No significant stenosis. Right renal: Patent origin and calcification. No significant stenosis. Mild proximal luminal narrowing. Inferior mesenteric: Heavily calcified origin but remains patent off of the distal aorta. Left iliac: Left common, internal and external iliac arteries remain patent. Mild tortuosity. Right iliac: Right common, internal and external iliac arteries are not sclerotic change over patent. Mild tortuosity. Venous findings: Hepatic, portal, splenic, mesenteric, and renal veins are remain patent. IVC and iliac veins are patent. No veno-occlusive process or acute thrombus. Review of the MIP images confirms  the above findings. Nonvascular findings: Lower chest: Minor basilar atelectasis versus scarring. Mild basilar hyperinflation. No pericardial pleural effusion. Normal heart size. Hepatobiliary: Previous cholecystectomy. Stable mild biliary prominence. No focal hepatic abnormality or biliary dilatation. Pancreas: Mild atrophy. No acute inflammatory process or fluid collection. No ductal dilatation. Spleen:  Within normal limits in size.  No focal abnormality. Adrenals/urinary tract: Normal adrenal glands. Exophytic left renal cyst measures 2.5 cm as before. No obstructing urinary tract calculus. No hydroureter. Urinary bladder unremarkable. No focal renal abnormality or mass. Stomach/bowel: Moderate hiatal hernia noted. Negative for bowel obstruction, significant dilatation, ileus or free air. Diffuse wall thickening and edema of the colon compatible with acute pancolitis. Colon contains scattered areas of hyperdense intraluminal material which may be some residual contrast. No definite active  arterial bleeding demonstrated by CTA within the GI tract. Sigmoid diverticulosis noted. Lymphatic:  No adenopathy Reproductive: Remote hysterectomy. No adnexal abnormality or mass trace pelvic free fluid likely related to the acute colitis. No fluid collection or abscess. No hematoma. Other: No inguinal abnormality or hernia. Intact abdominal wall. Indeterminate left breast cystic abnormality with peripheral calcifications, image 6 measures 17 mm. Consider correlation with mammogram. Musculoskeletal: Chronic compression fractures at T11, L3 and L4. Advanced degenerative changes of the lumbar spine. Bones are osteopenic. Lumbar facet arthropathy as well. No definite acute osseous finding. IMPRESSION: Diffuse colonic wall thickening and edema compatible with pancolitis. Sigmoid diverticulosis No significant active arterial bleeding demonstrated by CTA. Abdominal atherosclerosis without aneurysm Moderate hiatal hernia Remote cholecystectomy and hysterectomy Probable cystic peripherally calcified left breast abnormality. Recommend correlation with mammogram. Electronically Signed   By: Jerilynn Mages.  Shick M.D.   On: 12/21/2016 15:39    Impression/Plan: Infectious colitis - improving with supportive care. Abx NOT recommended for STEC. Primary team started Cipro for a UTI. Would avoid antibiotics if not needed. Continue soft diet. Supportive care. Will follow.    LOS: 2 days   [place] C.  12/23/2016, 1:25 PM

## 2016-12-23 NOTE — Progress Notes (Addendum)
[place] at [place] NAME: Angela Howe    MR#:  [id number]  DATE OF BIRTH:  [birth year]  SUBJECTIVE:  CHIEF COMPLAINT:   Chief Complaint  Patient presents with  . Diarrhea  . Abdominal Pain   Still abdominal pain, no nausea or vomiting, but had diarrhea twice today. REVIEW OF SYSTEMS:  Review of Systems  Constitutional: Positive for malaise/fatigue. Negative for chills and fever.  HENT: Negative for sore throat.   Eyes: Negative for blurred vision and double vision.  Respiratory: Negative for cough, hemoptysis, shortness of breath, wheezing and stridor.   Cardiovascular: Negative for chest pain, palpitations, orthopnea and leg swelling.  Gastrointestinal: Positive for abdominal pain and diarrhea. Negative for blood in stool, melena, nausea and vomiting.  Genitourinary: Negative for dysuria, flank pain and hematuria.  Musculoskeletal: Negative for back pain and joint pain.  Neurological: Positive for weakness. Negative for dizziness, sensory change, focal weakness, seizures, loss of consciousness and headaches.  Endo/Heme/Allergies: Negative for polydipsia.  Psychiatric/Behavioral: Negative for depression. The patient is not nervous/anxious.     DRUG ALLERGIES:   Allergies  Allergen Reactions  . Alendronate Sodium Other (See Comments)    Reaction:  Restlessness   . Prednisone Other (See Comments)    Reaction:  Restlessness    VITALS:  Blood pressure (!) 170/71, pulse 79, temperature 98.5 F (36.9 C), temperature source Oral, resp. rate 18, height 5' 4.5" (1.638 m), weight 97 lb 9.6 oz (44.3 kg), SpO2 99 %. PHYSICAL EXAMINATION:  Physical Exam  Constitutional: She is oriented to person, place, and time and well-developed, well-nourished, and in no distress.  HENT:  Head: Normocephalic.  Mouth/Throat: Oropharynx is clear and moist.  Eyes: Pupils are equal, round, and reactive to light. Conjunctivae and EOM are normal. No scleral  icterus.  Neck: Normal range of motion. Neck supple. No JVD present. No tracheal deviation present.  Cardiovascular: Normal rate, regular rhythm and normal heart sounds.  Exam reveals no gallop.   No murmur heard. Pulmonary/Chest: Effort normal and breath sounds normal. No respiratory distress. She has no wheezes. She has no rales.  Abdominal: Soft. Bowel sounds are normal. She exhibits no distension. There is tenderness. There is no rebound.  Musculoskeletal: Normal range of motion. She exhibits no edema or tenderness.  Neurological: She is alert and oriented to person, place, and time. No cranial nerve deficit.  Skin: No rash noted. No erythema.  Psychiatric: Affect normal.   LABORATORY PANEL:  Female CBC  Recent Labs Lab 12/22/16 0248 12/23/16 0723  WBC 9.8  --   HGB 10.6* 11.5*  HCT 30.5*  --   PLT 197  --    ------------------------------------------------------------------------------------------------------------------ Chemistries   Recent Labs Lab 12/21/16 1328 12/22/16 0248  NA 140 141  K 3.9 4.0  CL 107 112*  CO2 23 24  GLUCOSE 107* 91  BUN 30* 22*  CREATININE 0.95 0.91  CALCIUM 8.4* 7.6*  AST 46*  --   ALT 53  --   ALKPHOS 60  --   BILITOT 0.5  --    RADIOLOGY:  No results found. ASSESSMENT AND PLAN:   83 year old female patient with the diarrhea, generalized abdominal pain for last 3-4 days with CT evidence of pancolitis  #1. Pancolitis with leukocytosis, Infectious colitis. She was on IV Cipro, Flagyl, IV hydration.  negative C. Difficile. But GI panel showed Shiga like toxin producing E coli (STEC). Flagyl was discontinued. Would avoid antibiotics if not needed per  Dr. Michail Sermon. Leukocytosis improved.  * UTI. On Cipro, U/C: >=100,000 COLONIES/mL ENTEROBACTER AEROGENES  sensitive to cipro.  The patient has been on Cipro for 3 days. She has no complaints of dysuria or urinary urgency. discontinue Cipro.  #2  Occult GI bleeding with guaiac-positive  stools: Likely coming from pancolitis. Hemoglobin is stable at 11.5.   #3 history of hypothyroidism: Continue Synthroid. #4 history of unstable gait, worsening dementia: Physical therapy consult.  5. History of chronic back pain: Patient is on fentanyl patch for a long time. History of depression: Continue Celexa 20 mg daily. Amitriptyline 50 MG at bedtime. Hyperlipidemia: Continue atorvastatin 20 mg daily.  Dehydration. Improved with IV fluid support  All the records are reviewed and case discussed with Care Management/Social Worker. Management plans discussed with the patient, family and they are in agreement.  CODE STATUS: Full Code  TOTAL TIME TAKING CARE OF THIS PATIENT: 33 minutes.   More than 50% of the time was spent in counseling/coordination of care: YES  POSSIBLE D/C IN 1-2 DAYS, DEPENDING ON CLINICAL CONDITION.   Demetrios Loll M.D on 12/23/2016 at 1:13 PM  Between 7am to 6pm - Pager - [phone]  After 6pm go to [email or web address] - Proofreader  [facility]  Office  [phone]  CC: Primary care physician; Einar Pheasant, MD  Note: This dictation was prepared with Dragon dictation along with smaller phrase technology. Any transcriptional errors that result from this process are unintentional.

## 2016-12-24 LAB — GLUCOSE, CAPILLARY: GLUCOSE-CAPILLARY: 96 mg/dL (ref 65–99)

## 2016-12-24 NOTE — Discharge Instructions (Signed)
Heat healthy diet. Fall precaution.

## 2016-12-24 NOTE — Discharge Summary (Signed)
[facility] [place] NAME: Angela Howe    MR#:  [id number]  DATE OF BIRTH:  29-Feb-1926  DATE OF ADMISSION:  12/21/2016   ADMITTING PHYSICIAN: Epifanio Lesches, MD  DATE OF DISCHARGE: 12/24/2016 PRIMARY CARE PHYSICIAN: Einar Pheasant, MD   ADMISSION DIAGNOSIS:  Acute GI bleeding [K92.2] Lower abdominal pain [R10.30] DISCHARGE DIAGNOSIS:  Active Problems:   Pancolitis ([place])  SECONDARY DIAGNOSIS:   Past Medical History:  Diagnosis Date  . Breast cancer ([place])   . CAD (coronary artery disease)   . Degenerative disc disease   . GERD (gastroesophageal reflux disease)   . Hyperlipidemia    Previous intolerance of statins  . Hypothyroidism   . Orthostatic hypotension   . Osteoarthritis   . Peptic ulcer disease   . Syncope    HOSPITAL COURSE:   83 year old female patient with the diarrhea, generalized abdominal pain for last 3-4 days with CT evidence of pancolitis  #1. Pancolitis with leukocytosis, Infectious colitis. She was on IV Cipro, Flagyl, IV hydration.  negative C. Difficile. But GI panel showed Shiga like toxin producing E coli (STEC). Flagyl was discontinued. Would avoid antibiotics if not needed per Dr. Michail Sermon. Leukocytosis improved.  * UTI. On Cipro, U/C: >=100,000 COLONIES/mL ENTEROBACTER AEROGENES  sensitive to cipro.  The patient has been on Cipro for 3 days. She has no complaints of dysuria or urinary urgency. discontinued Cipro.  #2  Occult GI bleeding with guaiac-positive stools: Likely coming from pancolitis. Hemoglobin is stable at 11.5.   #3 history of hypothyroidism: Continue Synthroid. #4 history of unstable gait, worsening dementia: Physical therapy consult.  5. History of chronic back pain: Patient is on fentanyl patch for a long time. History of depression: Continue Celexa 20 mg daily. Amitriptyline 50 MG at bedtime. Hyperlipidemia: Continue atorvastatin 20 mg daily.  Dehydration. Improved  with IV fluid support Generalized weakness. PT evaluation suggested home health and PT. DISCHARGE CONDITIONS:  Stable, discharge to home with home health and PT today. CONSULTS OBTAINED:  Treatment Team:  Wilford Corner, MD DRUG ALLERGIES:   Allergies  Allergen Reactions  . Alendronate Sodium Other (See Comments)    Reaction:  Restlessness   . Prednisone Other (See Comments)    Reaction:  Restlessness    DISCHARGE MEDICATIONS:   Allergies as of 12/24/2016      Reactions   Alendronate Sodium Other (See Comments)   Reaction:  Restlessness    Prednisone Other (See Comments)   Reaction:  Restlessness       Medication List    TAKE these medications   amitriptyline 50 MG tablet Commonly known as:  ELAVIL TAKE 1 TABLET BY MOUTH AT BEDTIME   aspirin EC 81 MG tablet Take 81 mg by mouth daily.   atorvastatin 20 MG tablet Commonly known as:  LIPITOR Take 1 tablet (20 mg total) by mouth daily.   citalopram 20 MG tablet Commonly known as:  CELEXA Take 1 tablet (20 mg total) by mouth daily.   fentaNYL 12 MCG/HR Commonly known as:  DURAGESIC - dosed mcg/hr Place 1 patch (12.5 mcg total) onto the skin every 3 (three) days.   fludrocortisone 0.1 MG tablet Commonly known as:  FLORINEF Take 1 tablet (0.1 mg total) by mouth daily.   lansoprazole 30 MG capsule Commonly known as:  PREVACID Take 30 mg by mouth daily.   levothyroxine 50 MCG tablet Commonly known as:  SYNTHROID, LEVOTHROID Take 1 tablet (50 mcg total) by mouth daily.  nitroGLYCERIN 0.4 MG SL tablet Commonly known as:  NITROSTAT Place 1 tablet (0.4 mg total) under the tongue every 5 (five) minutes as needed for chest pain.            Discharge Care Instructions        Start     Ordered   12/24/16 0000  Increase activity slowly     12/24/16 5732   12/24/16 0000  Diet - low sodium heart healthy     12/24/16 2025       DISCHARGE INSTRUCTIONS:  See AVS. If you experience worsening of your  admission symptoms, develop shortness of breath, life threatening emergency, suicidal or homicidal thoughts you must seek medical attention immediately by calling 911 or calling your MD immediately  if symptoms less severe.  You Must read complete instructions/literature along with all the possible adverse reactions/side effects for all the Medicines you take and that have been prescribed to you. Take any new Medicines after you have completely understood and accpet all the possible adverse reactions/side effects.   Please note  You were cared for by a hospitalist during your hospital stay. If you have any questions about your discharge medications or the care you received while you were in the hospital after you are discharged, you can call the unit and asked to speak with the hospitalist on call if the hospitalist that took care of you is not available. Once you are discharged, your primary care physician will handle any further medical issues. Please note that NO REFILLS for any discharge medications will be authorized once you are discharged, as it is imperative that you return to your primary care physician (or establish a relationship with a primary care physician if you do not have one) for your aftercare needs so that they can reassess your need for medications and monitor your lab values.    On the day of Discharge:  VITAL SIGNS:  Blood pressure 112/60, pulse 83, temperature 98 F (36.7 C), temperature source Oral, resp. rate 16, height 5' 4.5" (1.638 m), weight 97 lb 9.6 oz (44.3 kg), SpO2 98 %. PHYSICAL EXAMINATION:  GENERAL:  83 y.o.-year-old patient lying in the bed with no acute distress.  EYES: Pupils equal, round, reactive to light and accommodation. No scleral icterus. Extraocular muscles intact.  HEENT: Head atraumatic, normocephalic. Oropharynx and nasopharynx clear.  NECK:  Supple, no jugular venous distention. No thyroid enlargement, no tenderness.  LUNGS: Normal breath sounds  bilaterally, no wheezing, rales,rhonchi or crepitation. No use of accessory muscles of respiration.  CARDIOVASCULAR: S1, S2 normal. No murmurs, rubs, or gallops.  ABDOMEN: Soft, non-tender, non-distended. Bowel sounds present. No organomegaly or mass.  EXTREMITIES: No pedal edema, cyanosis, or clubbing.  NEUROLOGIC: Cranial nerves II through XII are intact. Muscle strength 4/5 in all extremities. Sensation intact. Gait not checked.  PSYCHIATRIC: The patient is alert and oriented x 3.  SKIN: No obvious rash, lesion, or ulcer.  DATA REVIEW:   CBC  Recent Labs Lab 12/22/16 0248 12/23/16 0723  WBC 9.8  --   HGB 10.6* 11.5*  HCT 30.5*  --   PLT 197  --     Chemistries   Recent Labs Lab 12/21/16 1328 12/22/16 0248  NA 140 141  K 3.9 4.0  CL 107 112*  CO2 23 24  GLUCOSE 107* 91  BUN 30* 22*  CREATININE 0.95 0.91  CALCIUM 8.4* 7.6*  AST 46*  --   ALT 53  --   ALKPHOS 60  --  BILITOT 0.5  --      Microbiology Results  Results for orders placed or performed during the hospital encounter of 12/21/16  Urine culture     Status: Abnormal   Collection Time: 12/21/16  1:15 PM  Result Value Ref Range Status   Specimen Description URINE, RANDOM  Final   Special Requests NONE  Final   Culture >=100,000 COLONIES/mL ENTEROBACTER AEROGENES (A)  Final   Report Status 12/23/2016 FINAL  Final   Organism ID, Bacteria ENTEROBACTER AEROGENES (A)  Final      Susceptibility   Enterobacter aerogenes - MIC*    CEFAZOLIN >=64 RESISTANT Resistant     CEFTRIAXONE <=1 SENSITIVE Sensitive     CIPROFLOXACIN <=0.25 SENSITIVE Sensitive     GENTAMICIN <=1 SENSITIVE Sensitive     IMIPENEM 1 SENSITIVE Sensitive     NITROFURANTOIN 64 INTERMEDIATE Intermediate     TRIMETH/SULFA <=20 SENSITIVE Sensitive     PIP/TAZO <=4 SENSITIVE Sensitive     * >=100,000 COLONIES/mL ENTEROBACTER AEROGENES  C difficile quick scan w PCR reflex     Status: None   Collection Time: 12/21/16  3:56 PM  Result Value Ref  Range Status   C Diff antigen NEGATIVE NEGATIVE Final   C Diff toxin NEGATIVE NEGATIVE Final   C Diff interpretation No C. difficile detected.  Final  Gastrointestinal Panel by PCR , Stool     Status: Abnormal   Collection Time: 12/21/16  3:56 PM  Result Value Ref Range Status   Campylobacter species NOT DETECTED NOT DETECTED Final   Plesimonas shigelloides NOT DETECTED NOT DETECTED Final   Salmonella species NOT DETECTED NOT DETECTED Final   Yersinia enterocolitica NOT DETECTED NOT DETECTED Final   Vibrio species NOT DETECTED NOT DETECTED Final   Vibrio cholerae NOT DETECTED NOT DETECTED Final   Enteroaggregative E coli (EAEC) NOT DETECTED NOT DETECTED Final   Enterotoxigenic E coli (ETEC) NOT DETECTED NOT DETECTED Final   Shiga like toxin producing E coli (STEC) DETECTED (A) NOT DETECTED Final    Comment: RESULT CALLED TO, READ BACK BY AND VERIFIED WITH: JENNIFER COLE 12/22/16 @ 3833  [place]    E. coli O157 NOT DETECTED NOT DETECTED Final   Shigella/Enteroinvasive E coli (EIEC) NOT DETECTED NOT DETECTED Final   Cryptosporidium NOT DETECTED NOT DETECTED Final   Cyclospora cayetanensis NOT DETECTED NOT DETECTED Final   Entamoeba histolytica NOT DETECTED NOT DETECTED Final   Giardia lamblia NOT DETECTED NOT DETECTED Final   Adenovirus F40/41 NOT DETECTED NOT DETECTED Final   Astrovirus NOT DETECTED NOT DETECTED Final   Norovirus GI/GII NOT DETECTED NOT DETECTED Final   Rotavirus A NOT DETECTED NOT DETECTED Final   Sapovirus (I, II, IV, and V) NOT DETECTED NOT DETECTED Final    RADIOLOGY:  No results found.   Management plans discussed with the patient, family and they are in agreement.  CODE STATUS: Full Code   TOTAL TIME TAKING CARE OF THIS PATIENT: 32 minutes.    Demetrios Loll M.D on 12/24/2016 at 11:01 AM  Between 7am to 6pm - Pager - [phone]  After 6pm go to [email or web address] - Proofreader  [facility]  Office  [phone]  CC:  Primary care physician; Einar Pheasant, MD   Note: This dictation was prepared with Dragon dictation along with smaller phrase technology. Any transcriptional errors that result from this process are unintentional.

## 2016-12-24 NOTE — Care Management Note (Signed)
Case Management Note  Patient Details  Name: Willow Shidler MRN: [id number] Date of Birth: [birth year]  Subjective/Objective:   Discussed discharge planning with Mrs Koch. She chose [facility] for home health PT and RN. Malachy Mood at [facility] accepted this referral for HH-PT, RN                 Action/Plan:   Expected Discharge Date:  12/24/16               Expected Discharge Plan:  [place]  In-House Referral:     Discharge planning Services     Post Acute Care Choice:  Home Health Choice offered to:  Patient  DME Arranged:    DME Agency:     HH Arranged:  PT, RN HH Agency:  [place]  Status of Service:  Completed, signed off  If discussed at [place] of Stay Meetings, dates discussed:    Additional Comments:  Lance Galas A, RN 12/24/2016, 11:07 AM

## 2016-12-24 NOTE — Progress Notes (Signed)
[name] to be D/C'd Home per MD order.  Discussed with the patient and all questions fully answered.  VSS, Skin clean, dry and intact without evidence of skin break down, no evidence of skin tears noted. IV catheter discontinued intact. Site without signs and symptoms of complications. Dressing and pressure applied.  An After Visit Summary was printed and given to the patient. Patient received prescription.  D/c education completed with patient/family including follow up instructions, medication list, d/c activities limitations if indicated, with other d/c instructions as indicated by MD - patient able to verbalize understanding, all questions fully answered.   Patient instructed to return to ED, call 911, or call MD for any changes in condition.   Patient escorted via [place], and D/C home via private auto.  Deri Fuelling 12/24/2016 2:14 PM

## 2016-12-24 NOTE — Progress Notes (Signed)
Pt remaining alert this shift. No complaints. Pt has been able to sleep in between care. Report given to [facility] who will resume care of pt.

## 2016-12-24 NOTE — Evaluation (Signed)
Physical Therapy Evaluation Patient Details Name: Angela Howe MRN: [id number] DOB: 11/11/1925 Today's Date: 12/24/2016   History of Present Illness  pt was admitted on 12/21/2016 with CC of abdominal pain for 3-4 days combined with diarrhea. She has a Dx of pancolitis. Breast cancer, CAD, DDD, GERD, hyperlipidemia, hypothyroidism, orthostatic hypotension, OA, peptic Ulcer disease, and syncope. pt reports she currently lives alone but has an aid come 2 x a week for cooking and cleaning which she stated she plans to increase her frequency to 7 x  week.   Clinical Impression  Upon entering pt was laying in bed with her grandson in in the room. She is A&O x 4 and currently reports 4/10 pain across the lower abdominal region. She is able to perform general bed mobility and transfers independently. bil UE/LE exhibits general strength at 4/5.  She ambulated in the room/ hall ~ 100 ft requiring verbal cues to stay with RW, but reproted feeing fatigued limiting the walk. She performed exercises at the EOB with no difficulty. She would benefit from physical therapy to improve strength/ endurance, increase balance and stability to maximize her function. Upon discharged she plans to return home and would benefit continued physical therapy via HHPT.     Follow Up Recommendations Home health PT    Equipment Recommendations   (pt has rollator, SPC, and 2 wheeled RW at home)    Recommendations for Other Services Rehab consult     Precautions / Restrictions Precautions Precautions: None Restrictions Weight Bearing Restrictions: No      Mobility  Bed Mobility Overal bed mobility: Independent                Transfers Overall transfer level: Independent Equipment used: Rolling walker (2 wheeled)                Ambulation/Gait Ambulation/Gait assistance: Supervision Ambulation Distance (Feet): 100 Feet Assistive device: Rolling walker (2 wheeled) Gait Pattern/deviations:  Shuffle;Antalgic;Trendelenburg;Trunk flexed     General Gait Details: verbal cues to stay with walker avoiding it leading her, to stand up as straight  Stairs            Wheelchair Mobility    Modified Rankin (Stroke Patients Only)       Balance                                             Pertinent Vitals/Pain Pain Assessment: 0-10 Pain Score: 4  Pain Location: abdominal region Pain Descriptors / Indicators: Aching Pain Intervention(s): Monitored during session    Home Living Family/patient expects to be discharged to:: Private residence Living Arrangements: Alone Available Help at Discharge: Other (Comment) (aid) Type of Home: House Home Access: Level entry     Home Layout: One level Home Equipment: Cane - quad;Walker - 2 wheels (rollator)      Prior Function Level of Independence: Needs assistance   Gait / Transfers Assistance Needed: supervision  ADL's / Homemaking Assistance Needed: Aid helps with cooking and cleaning        Hand Dominance   Dominant Hand: Right    Extremity/Trunk Assessment   Upper Extremity Assessment Upper Extremity Assessment: Overall WFL for tasks assessed (4/5 )    Lower Extremity Assessment Lower Extremity Assessment: Overall WFL for tasks assessed (4/5)       Communication   Communication: No difficulties  Cognition Arousal/Alertness: Awake/alert  Behavior During Therapy: WFL for tasks assessed/performed Overall Cognitive Status: Within Functional Limits for tasks assessed                                        General Comments      Exercises Other Exercises Other Exercises: ankle pumps 1 x 20 alternating, alternating hip flexion (seated EOB)1 x 30, alternating LAQ 1 x 30    Assessment/Plan    PT Assessment Patient needs continued PT services  PT Problem List Decreased strength;Decreased activity tolerance;Decreased safety awareness;Decreased mobility;Decreased  balance;Decreased range of motion       PT Treatment Interventions Gait training;Functional mobility training;Therapeutic activities;Neuromuscular re-education;Balance training;Therapeutic exercise;Patient/family education    PT Goals (Current goals can be found in the Care Plan section)  Acute Rehab PT Goals Patient Stated Goal: to be able to go home PT Goal Formulation: With patient Time For Goal Achievement: 01/07/17 Potential to Achieve Goals: Good    Frequency Min 2X/week   Barriers to discharge        Co-evaluation               AM-PAC PT "6 Clicks" Daily Activity  Outcome Measure Difficulty turning over in bed (including adjusting bedclothes, sheets and blankets)?: A Little Difficulty moving from lying on back to sitting on the side of the bed? : A Little Difficulty sitting down on and standing up from a chair with arms (e.g., wheelchair, bedside commode, etc,.)?: A Little Help needed moving to and from a bed to chair (including a wheelchair)?: A Little Help needed walking in hospital room?: A Little Help needed climbing 3-5 steps with a railing? : A Little 6 Click Score: 18    End of Session Equipment Utilized During Treatment: Gait belt Activity Tolerance: Patient tolerated treatment well;No increased pain;Patient limited by fatigue Patient left: in bed;with bed alarm set;with family/visitor present Nurse Communication: Mobility status (notified of how evaluation went) PT Visit Diagnosis: Other abnormalities of gait and mobility (R26.89);Muscle weakness (generalized) (M62.81);History of falling (Z91.81);Difficulty in walking, not elsewhere classified (R26.2)    Time: 1660-6301 PT Time Calculation (min) (ACUTE ONLY): 28 min   Charges:   PT Evaluation $PT Eval Moderate Complexity: 1 Mod PT Treatments $Gait Training: 8-22 mins $Therapeutic Exercise: 8-22 mins   PT G Codes:   PT G-Codes **NOT FOR INPATIENT CLASS** Functional Assessment Tool Used: AM-PAC 6  Clicks Basic Mobility;Clinical judgement Functional Limitation: Mobility: Walking and moving around Mobility: Walking and Moving Around Current Status (S0109): At least 40 percent but less than 60 percent impaired, limited or restricted Mobility: Walking and Moving Around Goal Status [phone]): At least 20 percent but less than 40 percent impaired, limited or restricted    Starr Lake PT, DPT, LAT, ATC  12/24/16  10:52 AM       Starr Lake 12/24/2016, 10:48 AM

## 2016-12-26 ENCOUNTER — Telehealth: Payer: Self-pay | Admitting: Internal Medicine

## 2016-12-26 DIAGNOSIS — Z951 Presence of aortocoronary bypass graft: Secondary | ICD-10-CM | POA: Diagnosis not present

## 2016-12-26 DIAGNOSIS — E039 Hypothyroidism, unspecified: Secondary | ICD-10-CM | POA: Diagnosis not present

## 2016-12-26 DIAGNOSIS — I10 Essential (primary) hypertension: Secondary | ICD-10-CM | POA: Diagnosis not present

## 2016-12-26 DIAGNOSIS — Z8711 Personal history of peptic ulcer disease: Secondary | ICD-10-CM | POA: Diagnosis not present

## 2016-12-26 DIAGNOSIS — M6281 Muscle weakness (generalized): Secondary | ICD-10-CM | POA: Diagnosis not present

## 2016-12-26 DIAGNOSIS — Z853 Personal history of malignant neoplasm of breast: Secondary | ICD-10-CM | POA: Diagnosis not present

## 2016-12-26 DIAGNOSIS — I2510 Atherosclerotic heart disease of native coronary artery without angina pectoris: Secondary | ICD-10-CM | POA: Diagnosis not present

## 2016-12-26 DIAGNOSIS — E785 Hyperlipidemia, unspecified: Secondary | ICD-10-CM | POA: Diagnosis not present

## 2016-12-26 DIAGNOSIS — D649 Anemia, unspecified: Secondary | ICD-10-CM | POA: Diagnosis not present

## 2016-12-26 DIAGNOSIS — R2681 Unsteadiness on feet: Secondary | ICD-10-CM | POA: Diagnosis not present

## 2016-12-26 DIAGNOSIS — M1991 Primary osteoarthritis, unspecified site: Secondary | ICD-10-CM | POA: Diagnosis not present

## 2016-12-26 DIAGNOSIS — K219 Gastro-esophageal reflux disease without esophagitis: Secondary | ICD-10-CM | POA: Diagnosis not present

## 2016-12-26 DIAGNOSIS — K51011 Ulcerative (chronic) pancolitis with rectal bleeding: Secondary | ICD-10-CM | POA: Diagnosis not present

## 2016-12-26 DIAGNOSIS — G4700 Insomnia, unspecified: Secondary | ICD-10-CM | POA: Diagnosis not present

## 2016-12-26 NOTE — Telephone Encounter (Signed)
Transition Care Management Follow-up Telephone Call  How have you been since you were released from the hospital? Feeling much better no blood in stool and pain has eased in abdomen.   Do you understand why you were in the hospital? Yes   Do you understand the discharge instrcutions? Yes  Items Reviewed:  Medications reviewed: yes  Allergies reviewed:Yes  Dietary changes reviewed: Yes  Referrals reviewed: Yes   Functional Questionnaire:   Activities of Daily Living (ADLs):   She states they are independent in the following: In all ADLs States they require assistance with the following: No assist needed.   Any transportation issues/concerns?: {No   Any patient concerns? No   Confirmed importance and date/time of follow-up visits scheduled: Yes  Confirmed with patient if condition begins to worsen call PCP or go to the ER.  Patient was given the Call-a-Nurse line [phone]: Yes

## 2016-12-27 ENCOUNTER — Other Ambulatory Visit: Payer: Self-pay | Admitting: *Deleted

## 2016-12-27 DIAGNOSIS — G4700 Insomnia, unspecified: Secondary | ICD-10-CM | POA: Diagnosis not present

## 2016-12-27 DIAGNOSIS — R2681 Unsteadiness on feet: Secondary | ICD-10-CM | POA: Diagnosis not present

## 2016-12-27 DIAGNOSIS — Z951 Presence of aortocoronary bypass graft: Secondary | ICD-10-CM | POA: Diagnosis not present

## 2016-12-27 DIAGNOSIS — D649 Anemia, unspecified: Secondary | ICD-10-CM | POA: Diagnosis not present

## 2016-12-27 DIAGNOSIS — I10 Essential (primary) hypertension: Secondary | ICD-10-CM | POA: Diagnosis not present

## 2016-12-27 DIAGNOSIS — E039 Hypothyroidism, unspecified: Secondary | ICD-10-CM | POA: Diagnosis not present

## 2016-12-27 DIAGNOSIS — K51011 Ulcerative (chronic) pancolitis with rectal bleeding: Secondary | ICD-10-CM | POA: Diagnosis not present

## 2016-12-27 DIAGNOSIS — K219 Gastro-esophageal reflux disease without esophagitis: Secondary | ICD-10-CM | POA: Diagnosis not present

## 2016-12-27 DIAGNOSIS — M1991 Primary osteoarthritis, unspecified site: Secondary | ICD-10-CM | POA: Diagnosis not present

## 2016-12-27 DIAGNOSIS — Z853 Personal history of malignant neoplasm of breast: Secondary | ICD-10-CM | POA: Diagnosis not present

## 2016-12-27 DIAGNOSIS — M6281 Muscle weakness (generalized): Secondary | ICD-10-CM | POA: Diagnosis not present

## 2016-12-27 DIAGNOSIS — Z8711 Personal history of peptic ulcer disease: Secondary | ICD-10-CM | POA: Diagnosis not present

## 2016-12-27 DIAGNOSIS — I2510 Atherosclerotic heart disease of native coronary artery without angina pectoris: Secondary | ICD-10-CM | POA: Diagnosis not present

## 2016-12-27 DIAGNOSIS — E785 Hyperlipidemia, unspecified: Secondary | ICD-10-CM | POA: Diagnosis not present

## 2016-12-27 NOTE — Patient Outreach (Signed)
[facility]) Care Management  12/27/2016  Angela Howe 10/05/1925 [id number]   EMMI- General Discharge RED ON EMMI ALERT DAY#: 1 DATE: 12/26/16 RED ALERT: Read discharge paperwork? No Know who to call about changes in condition? No  Outreach attempt #1 to patient, spoke with patient, reviewed and addressed red alert. Patient stated, "she doesn't remember answering questions or speaking with anyone". Patient confirmed, she did not read her discharge paperwork. She believes her daughter has her hospital discharge paperwork. RN CM [place]) reviewed discharge paperwork with patient.   Patient is active with a home health agency (PT). She was unable to state the name of the agency. Patient stated, she is doing better. She reported feeling weak at night. She uses a cane or RW to assist with mobility. She has a private pay care giver for 2 days a week. The plan is to increase her caregiver days to 5/wk.   Patient's has an appointment scheduled on 01/05/17 with her PCP. She plans to have labs drawn on 01/16/17.  Plan: RN CM will notify [facility] CM administrative assistant regarding case closure.  [place] Bells, RN, BSN, MHA/MSL, [place] Telephonic Care Manager Coordinator [facility] Direct Phone: [phone] Toll Free: [phone] Fax: [phone]

## 2016-12-29 DIAGNOSIS — M1991 Primary osteoarthritis, unspecified site: Secondary | ICD-10-CM | POA: Diagnosis not present

## 2016-12-29 DIAGNOSIS — Z8711 Personal history of peptic ulcer disease: Secondary | ICD-10-CM | POA: Diagnosis not present

## 2016-12-29 DIAGNOSIS — R2681 Unsteadiness on feet: Secondary | ICD-10-CM | POA: Diagnosis not present

## 2016-12-29 DIAGNOSIS — I2510 Atherosclerotic heart disease of native coronary artery without angina pectoris: Secondary | ICD-10-CM | POA: Diagnosis not present

## 2016-12-29 DIAGNOSIS — Z951 Presence of aortocoronary bypass graft: Secondary | ICD-10-CM | POA: Diagnosis not present

## 2016-12-29 DIAGNOSIS — E785 Hyperlipidemia, unspecified: Secondary | ICD-10-CM | POA: Diagnosis not present

## 2016-12-29 DIAGNOSIS — D649 Anemia, unspecified: Secondary | ICD-10-CM | POA: Diagnosis not present

## 2016-12-29 DIAGNOSIS — M6281 Muscle weakness (generalized): Secondary | ICD-10-CM | POA: Diagnosis not present

## 2016-12-29 DIAGNOSIS — Z853 Personal history of malignant neoplasm of breast: Secondary | ICD-10-CM | POA: Diagnosis not present

## 2016-12-29 DIAGNOSIS — G4700 Insomnia, unspecified: Secondary | ICD-10-CM | POA: Diagnosis not present

## 2016-12-29 DIAGNOSIS — K51011 Ulcerative (chronic) pancolitis with rectal bleeding: Secondary | ICD-10-CM | POA: Diagnosis not present

## 2016-12-29 DIAGNOSIS — K219 Gastro-esophageal reflux disease without esophagitis: Secondary | ICD-10-CM | POA: Diagnosis not present

## 2016-12-29 DIAGNOSIS — I10 Essential (primary) hypertension: Secondary | ICD-10-CM | POA: Diagnosis not present

## 2016-12-29 DIAGNOSIS — E039 Hypothyroidism, unspecified: Secondary | ICD-10-CM | POA: Diagnosis not present

## 2017-01-02 DIAGNOSIS — F0280 Dementia in other diseases classified elsewhere without behavioral disturbance: Secondary | ICD-10-CM | POA: Diagnosis not present

## 2017-01-02 DIAGNOSIS — Z853 Personal history of malignant neoplasm of breast: Secondary | ICD-10-CM | POA: Diagnosis not present

## 2017-01-02 DIAGNOSIS — K51011 Ulcerative (chronic) pancolitis with rectal bleeding: Secondary | ICD-10-CM | POA: Diagnosis not present

## 2017-01-02 DIAGNOSIS — R2681 Unsteadiness on feet: Secondary | ICD-10-CM | POA: Diagnosis not present

## 2017-01-02 DIAGNOSIS — Z8711 Personal history of peptic ulcer disease: Secondary | ICD-10-CM

## 2017-01-02 DIAGNOSIS — G4700 Insomnia, unspecified: Secondary | ICD-10-CM | POA: Diagnosis not present

## 2017-01-02 DIAGNOSIS — M1991 Primary osteoarthritis, unspecified site: Secondary | ICD-10-CM | POA: Diagnosis not present

## 2017-01-02 DIAGNOSIS — I10 Essential (primary) hypertension: Secondary | ICD-10-CM | POA: Diagnosis not present

## 2017-01-02 DIAGNOSIS — I2510 Atherosclerotic heart disease of native coronary artery without angina pectoris: Secondary | ICD-10-CM | POA: Diagnosis not present

## 2017-01-02 DIAGNOSIS — E039 Hypothyroidism, unspecified: Secondary | ICD-10-CM | POA: Diagnosis not present

## 2017-01-02 DIAGNOSIS — M6281 Muscle weakness (generalized): Secondary | ICD-10-CM | POA: Diagnosis not present

## 2017-01-02 DIAGNOSIS — Z951 Presence of aortocoronary bypass graft: Secondary | ICD-10-CM | POA: Diagnosis not present

## 2017-01-02 DIAGNOSIS — K219 Gastro-esophageal reflux disease without esophagitis: Secondary | ICD-10-CM | POA: Diagnosis not present

## 2017-01-02 DIAGNOSIS — D649 Anemia, unspecified: Secondary | ICD-10-CM | POA: Diagnosis not present

## 2017-01-02 DIAGNOSIS — F339 Major depressive disorder, recurrent, unspecified: Secondary | ICD-10-CM | POA: Diagnosis not present

## 2017-01-02 DIAGNOSIS — E785 Hyperlipidemia, unspecified: Secondary | ICD-10-CM | POA: Diagnosis not present

## 2017-01-04 DIAGNOSIS — D649 Anemia, unspecified: Secondary | ICD-10-CM | POA: Diagnosis not present

## 2017-01-04 DIAGNOSIS — M6281 Muscle weakness (generalized): Secondary | ICD-10-CM | POA: Diagnosis not present

## 2017-01-04 DIAGNOSIS — Z951 Presence of aortocoronary bypass graft: Secondary | ICD-10-CM | POA: Diagnosis not present

## 2017-01-04 DIAGNOSIS — Z853 Personal history of malignant neoplasm of breast: Secondary | ICD-10-CM | POA: Diagnosis not present

## 2017-01-04 DIAGNOSIS — M1991 Primary osteoarthritis, unspecified site: Secondary | ICD-10-CM | POA: Diagnosis not present

## 2017-01-04 DIAGNOSIS — I2510 Atherosclerotic heart disease of native coronary artery without angina pectoris: Secondary | ICD-10-CM | POA: Diagnosis not present

## 2017-01-04 DIAGNOSIS — K219 Gastro-esophageal reflux disease without esophagitis: Secondary | ICD-10-CM | POA: Diagnosis not present

## 2017-01-04 DIAGNOSIS — Z8711 Personal history of peptic ulcer disease: Secondary | ICD-10-CM | POA: Diagnosis not present

## 2017-01-04 DIAGNOSIS — R2681 Unsteadiness on feet: Secondary | ICD-10-CM | POA: Diagnosis not present

## 2017-01-04 DIAGNOSIS — K51011 Ulcerative (chronic) pancolitis with rectal bleeding: Secondary | ICD-10-CM | POA: Diagnosis not present

## 2017-01-04 DIAGNOSIS — E785 Hyperlipidemia, unspecified: Secondary | ICD-10-CM | POA: Diagnosis not present

## 2017-01-04 DIAGNOSIS — G4700 Insomnia, unspecified: Secondary | ICD-10-CM | POA: Diagnosis not present

## 2017-01-04 DIAGNOSIS — I10 Essential (primary) hypertension: Secondary | ICD-10-CM | POA: Diagnosis not present

## 2017-01-04 DIAGNOSIS — E039 Hypothyroidism, unspecified: Secondary | ICD-10-CM | POA: Diagnosis not present

## 2017-01-05 ENCOUNTER — Ambulatory Visit (INDEPENDENT_AMBULATORY_CARE_PROVIDER_SITE_OTHER): Payer: Medicare Other | Admitting: Internal Medicine

## 2017-01-05 ENCOUNTER — Encounter: Payer: Self-pay | Admitting: Internal Medicine

## 2017-01-05 VITALS — BP 128/74 | HR 83 | Temp 98.4°F | Resp 14 | Ht 65.0 in | Wt 109.0 lb

## 2017-01-05 DIAGNOSIS — M545 Low back pain: Secondary | ICD-10-CM | POA: Diagnosis not present

## 2017-01-05 DIAGNOSIS — Z23 Encounter for immunization: Secondary | ICD-10-CM

## 2017-01-05 DIAGNOSIS — K5100 Ulcerative (chronic) pancolitis without complications: Secondary | ICD-10-CM

## 2017-01-05 DIAGNOSIS — E039 Hypothyroidism, unspecified: Secondary | ICD-10-CM

## 2017-01-05 DIAGNOSIS — Z853 Personal history of malignant neoplasm of breast: Secondary | ICD-10-CM

## 2017-01-05 DIAGNOSIS — G8929 Other chronic pain: Secondary | ICD-10-CM | POA: Diagnosis not present

## 2017-01-05 DIAGNOSIS — M79606 Pain in leg, unspecified: Secondary | ICD-10-CM

## 2017-01-05 DIAGNOSIS — I10 Essential (primary) hypertension: Secondary | ICD-10-CM

## 2017-01-05 DIAGNOSIS — D649 Anemia, unspecified: Secondary | ICD-10-CM

## 2017-01-05 DIAGNOSIS — I2510 Atherosclerotic heart disease of native coronary artery without angina pectoris: Secondary | ICD-10-CM

## 2017-01-05 DIAGNOSIS — R1084 Generalized abdominal pain: Secondary | ICD-10-CM

## 2017-01-05 DIAGNOSIS — E785 Hyperlipidemia, unspecified: Secondary | ICD-10-CM | POA: Diagnosis not present

## 2017-01-05 LAB — CBC WITH DIFFERENTIAL/PLATELET
BASOS PCT: 0.5 % (ref 0.0–3.0)
Basophils Absolute: 0 10*3/uL (ref 0.0–0.1)
Eosinophils Absolute: 0.2 10*3/uL (ref 0.0–0.7)
Eosinophils Relative: 2.3 % (ref 0.0–5.0)
HCT: 32.1 % — ABNORMAL LOW (ref 36.0–46.0)
HEMOGLOBIN: 10.6 g/dL — AB (ref 12.0–15.0)
Lymphocytes Relative: 35.1 % (ref 12.0–46.0)
Lymphs Abs: 2.8 10*3/uL (ref 0.7–4.0)
MCHC: 33.1 g/dL (ref 30.0–36.0)
MCV: 93.1 fl (ref 78.0–100.0)
MONO ABS: 0.7 10*3/uL (ref 0.1–1.0)
Monocytes Relative: 8.6 % (ref 3.0–12.0)
Neutro Abs: 4.3 10*3/uL (ref 1.4–7.7)
Neutrophils Relative %: 53.5 % (ref 43.0–77.0)
Platelets: 296 10*3/uL (ref 150.0–400.0)
RBC: 3.45 Mil/uL — ABNORMAL LOW (ref 3.87–5.11)
RDW: 15.7 % — AB (ref 11.5–15.5)
WBC: 8 10*3/uL (ref 4.0–10.5)

## 2017-01-05 LAB — BASIC METABOLIC PANEL
BUN: 22 mg/dL (ref 6–23)
CALCIUM: 8.7 mg/dL (ref 8.4–10.5)
CO2: 26 meq/L (ref 19–32)
Chloride: 108 mEq/L (ref 96–112)
Creatinine, Ser: 0.94 mg/dL (ref 0.40–1.20)
GFR: 59.59 mL/min — AB (ref >60.00)
Glucose, Bld: 83 mg/dL (ref 70–99)
Potassium: 4.9 mEq/L (ref 3.5–5.1)
SODIUM: 141 meq/L (ref 135–145)

## 2017-01-05 LAB — HEPATIC FUNCTION PANEL
ALBUMIN: 3.1 g/dL — AB (ref 3.5–5.2)
ALT: 13 U/L (ref 0–35)
AST: 17 U/L (ref 0–37)
Alkaline Phosphatase: 45 U/L (ref 39–117)
Bilirubin, Direct: 0.1 mg/dL (ref 0.0–0.3)
Total Bilirubin: 0.3 mg/dL (ref 0.2–1.2)
Total Protein: 5.6 g/dL — ABNORMAL LOW (ref 6.0–8.3)

## 2017-01-05 LAB — VITAMIN B12: VITAMIN B 12: 527 pg/mL (ref 211–911)

## 2017-01-05 LAB — FERRITIN: FERRITIN: 31.8 ng/mL (ref 10.0–291.0)

## 2017-01-05 LAB — TSH: TSH: 3.25 u[IU]/mL (ref 0.35–4.50)

## 2017-01-05 NOTE — Progress Notes (Signed)
Patient ID: Angela Howe, female   DOB: [birth year], 83 y.o.   MRN: [id number]   Subjective:    Patient ID: Angela Howe, female    DOB: Dec 09, 1925, 83 y.o.   MRN: [id number]  HPI  Patient here for hospital follow up.  She was admitted 12/21/16 and diagnosed with pancolitis.  She was treated with IV cipro and flagyl.  Also hydrated.  GI panel showed Shiga like toxin producing E. Coli.  Flagyl was discontinued.  Was also diagnosed with UTI.  Treated with cipro for three days.  cipro was discontinued.  No urinary symptoms.  Diarrhea improved.  Eating better.  No nausea or vomiting.  Recommended home health PT at discharge.  Was noted to have guaic positive stool.  CBC remained stable.  Weight has improved.  CT scan as outlined.  Notice of left breast abnormality.  Discussed mammogram.  She declines.  Declines any further testing or w/up.  She reports some soreness on her leg.  Sore to touch.  No redness.  No swelling extending up her leg. Overall she feels better.     Past Medical History:  Diagnosis Date  . Breast cancer ([place])   . CAD (coronary artery disease)   . Degenerative disc disease   . GERD (gastroesophageal reflux disease)   . Hyperlipidemia    Previous intolerance of statins  . Hypothyroidism   . Orthostatic hypotension   . Osteoarthritis   . Peptic ulcer disease   . Syncope    Past Surgical History:  Procedure Laterality Date  . ABDOMINAL HYSTERECTOMY  1970  . APPENDECTOMY    . BIOPSY BREAST  2014  . BREAST EXCISIONAL BIOPSY Left 2014   neg  . BREAST EXCISIONAL BIOPSY Left 2006   postive radation  . BREAST LUMPECTOMY  2006  . CHOLECYSTECTOMY    . CORONARY ARTERY BYPASS GRAFT  05/2006   x3  . NASAL SINUS SURGERY    . ROTATOR CUFF REPAIR     Family History  Problem Relation Age of Onset  . Heart attack Sister   . Cancer Brother   . Pancreatic cancer Brother   . Ovarian cancer Sister    Social History   Social History  . Marital status: Widowed   Spouse name: N/A  . Number of children: N/A  . Years of education: N/A   Occupational History  . Retired Retired   Social History Main Topics  . Smoking status: Never Smoker  . Smokeless tobacco: Never Used  . Alcohol use No  . Drug use: No  . Sexual activity: Not Asked   Other Topics Concern  . None   Social History Narrative   Widowed   Gets regular exercise    Outpatient Encounter Prescriptions as of 01/05/2017  Medication Sig  . amitriptyline (ELAVIL) 50 MG tablet TAKE 1 TABLET BY MOUTH AT BEDTIME  . aspirin EC 81 MG tablet Take 81 mg by mouth daily.  Marland Kitchen atorvastatin (LIPITOR) 20 MG tablet Take 1 tablet (20 mg total) by mouth daily.  . citalopram (CELEXA) 20 MG tablet Take 1 tablet (20 mg total) by mouth daily.  . fentaNYL (DURAGESIC - DOSED MCG/HR) 12 MCG/HR Place 1 patch (12.5 mcg total) onto the skin every 3 (three) days.  . fludrocortisone (FLORINEF) 0.1 MG tablet Take 1 tablet (0.1 mg total) by mouth daily.  . lansoprazole (PREVACID) 30 MG capsule Take 30 mg by mouth daily.    Marland Kitchen levothyroxine (SYNTHROID, LEVOTHROID) 50 MCG tablet Take 1  tablet (50 mcg total) by mouth daily.  . nitroGLYCERIN (NITROSTAT) 0.4 MG SL tablet Place 1 tablet (0.4 mg total) under the tongue every 5 (five) minutes as needed for chest pain.   No facility-administered encounter medications on file as of 01/05/2017.     Review of Systems  Constitutional:       Weight has increased.  Eating better.   HENT: Negative for congestion and sinus pressure.   Respiratory: Negative for cough, chest tightness and shortness of breath.   Cardiovascular: Negative for chest pain, palpitations and leg swelling.  Gastrointestinal: Negative for abdominal pain, diarrhea, nausea and vomiting.  Genitourinary: Negative for difficulty urinating and dysuria.  Musculoskeletal: Negative for joint swelling and myalgias.  Skin: Negative for color change and rash.  Neurological: Negative for dizziness, light-headedness and  headaches.  Psychiatric/Behavioral: Negative for agitation and dysphoric mood.       Objective:    Physical Exam  Constitutional: She appears well-developed and well-nourished. No distress.  HENT:  Nose: Nose normal.  Mouth/Throat: Oropharynx is clear and moist.  Neck: Neck supple. No thyromegaly present.  Cardiovascular: Normal rate and regular rhythm.   Pulmonary/Chest: Breath sounds normal. No respiratory distress. She has no wheezes.  Abdominal: Soft. Bowel sounds are normal.  Increased abdominal discomfort with palpation.  No rebound or guarding.   Musculoskeletal: She exhibits no edema or tenderness.  Minimal increased soft tissue - lower leg.  Some tenderness to palpation.  No calf tenderness or swelling.    Lymphadenopathy:    She has no cervical adenopathy.  Skin: No rash noted. No erythema.  Psychiatric: She has a normal mood and affect. Her behavior is normal.    BP 128/74 (BP Location: Left Arm, Patient Position: Sitting, Cuff Size: Normal)   Pulse 83   Temp 98.4 F (36.9 C) (Oral)   Resp 14   Ht 5\' 5"  (1.651 m)   Wt 109 lb (49.4 kg)   SpO2 96%   BMI 18.14 kg/m  Wt Readings from Last 3 Encounters:  01/05/17 109 lb (49.4 kg)  12/23/16 97 lb 9.6 oz (44.3 kg)  11/17/16 103 lb 3.2 oz (46.8 kg)     Lab Results  Component Value Date   WBC 8.0 01/05/2017   HGB 10.6 (L) 01/05/2017   HCT 32.1 (L) 01/05/2017   PLT 296.0 01/05/2017   GLUCOSE 83 01/05/2017   CHOL 116 11/17/2016   TRIG 79.0 11/17/2016   HDL 57.50 11/17/2016   LDLCALC 43 11/17/2016   ALT 13 01/05/2017   AST 17 01/05/2017   NA 141 01/05/2017   K 4.9 01/05/2017   CL 108 01/05/2017   CREATININE 0.94 01/05/2017   BUN 22 01/05/2017   CO2 26 01/05/2017   TSH 3.25 01/05/2017   INR 1.12 12/21/2016    Ct Angio Abd/pel W And/or Wo Contrast  Result Date: 12/21/2016 CLINICAL DATA:  Abdominal pain and diarrhea, GI bleed EXAM: CT ANGIOGRAPHY ABDOMEN AND PELVIS TECHNIQUE: Multidetector CT imaging of  the abdomen and pelvis was performed using the standard protocol during bolus administration of intravenous contrast. Multiplanar reconstructed images including MIPs were obtained and reviewed to evaluate the vascular anatomy. CONTRAST:  75 cc Isovue 370 COMPARISON:  10/01/2015 FINDINGS: Arterial findings: Aorta: Heavy calcific atherosclerosis of the abdominal aorta with mild tortuosity. No significant aneurysm, dissection, or acute process. No retroperitoneal hemorrhage or hematoma. Celiac axis: Heavily calcified origin with ostial narrowing, estimated less than 50%. Celiac remains patent. Superior mesenteric: Heavily calcified origin with mild ostial  narrowing, also less than 50%, remain patent. Left renal: Patent origin with heavy calcification. Mild luminal narrowing. No significant stenosis. Right renal: Patent origin and calcification. No significant stenosis. Mild proximal luminal narrowing. Inferior mesenteric: Heavily calcified origin but remains patent off of the distal aorta. Left iliac: Left common, internal and external iliac arteries remain patent. Mild tortuosity. Right iliac: Right common, internal and external iliac arteries are not sclerotic change over patent. Mild tortuosity. Venous findings: Hepatic, portal, splenic, mesenteric, and renal veins are remain patent. IVC and iliac veins are patent. No veno-occlusive process or acute thrombus. Review of the MIP images confirms the above findings. Nonvascular findings: Lower chest: Minor basilar atelectasis versus scarring. Mild basilar hyperinflation. No pericardial pleural effusion. Normal heart size. Hepatobiliary: Previous cholecystectomy. Stable mild biliary prominence. No focal hepatic abnormality or biliary dilatation. Pancreas: Mild atrophy. No acute inflammatory process or fluid collection. No ductal dilatation. Spleen:  Within normal limits in size.  No focal abnormality. Adrenals/urinary tract: Normal adrenal glands. Exophytic left renal  cyst measures 2.5 cm as before. No obstructing urinary tract calculus. No hydroureter. Urinary bladder unremarkable. No focal renal abnormality or mass. Stomach/bowel: Moderate hiatal hernia noted. Negative for bowel obstruction, significant dilatation, ileus or free air. Diffuse wall thickening and edema of the colon compatible with acute pancolitis. Colon contains scattered areas of hyperdense intraluminal material which may be some residual contrast. No definite active arterial bleeding demonstrated by CTA within the GI tract. Sigmoid diverticulosis noted. Lymphatic:  No adenopathy Reproductive: Remote hysterectomy. No adnexal abnormality or mass trace pelvic free fluid likely related to the acute colitis. No fluid collection or abscess. No hematoma. Other: No inguinal abnormality or hernia. Intact abdominal wall. Indeterminate left breast cystic abnormality with peripheral calcifications, image 6 measures 17 mm. Consider correlation with mammogram. Musculoskeletal: Chronic compression fractures at T11, L3 and L4. Advanced degenerative changes of the lumbar spine. Bones are osteopenic. Lumbar facet arthropathy as well. No definite acute osseous finding. IMPRESSION: Diffuse colonic wall thickening and edema compatible with pancolitis. Sigmoid diverticulosis No significant active arterial bleeding demonstrated by CTA. Abdominal atherosclerosis without aneurysm Moderate hiatal hernia Remote cholecystectomy and hysterectomy Probable cystic peripherally calcified left breast abnormality. Recommend correlation with mammogram. Electronically Signed   By: Jerilynn Mages.  Shick M.D.   On: 12/21/2016 15:39       Assessment & Plan:   Problem List Items Addressed This Visit    Abdominal pain - Primary    Found on exam.  No report of pain after discharge.  States feels better.  Eating better.  Bowels better.  She declines any further evaluation. Wants to monitor.  Follow.        Anemia    Follow cbc.       Relevant Orders     CBC with Differential/Platelet (Completed)   Ferritin (Completed)   Vitamin B12 (Completed)   Back pain    Chronic.  Sees dr Sharlet Salina.        CAD, NATIVE VESSEL    Followed by cardiology.  Continue risk factor modification.        History of breast cancer    She has noticed a palpable area in her breast.  Recent CT documented cystic peripherally calcified breast abnormality.  Discussed with her and her daughter today.  She declines mammogram and any further w/up.        Hyperlipemia    On lipitor.  Follow lipid panel and liver function tests.        HYPERTENSION, BENIGN  Blood pressure under good control.  Continue same medication regimen.  Follow pressures.  Follow metabolic panel.        Relevant Orders   Hepatic function panel (Completed)   Basic metabolic panel (Completed)   Hypothyroidism    On thyroid replacement.  Follow tsh.       Relevant Orders   TSH (Completed)   Pancolitis ([place])    Recently admitted as outlined.  Treated with IV abx initially.  Culture as outlined.  Taken off abx.  Doing well now.  Bowels better.  Eating better.  Weight is better.  Follow.         Other Visit Diagnoses    Encounter for immunization       Relevant Orders   Flu vaccine HIGH DOSE PF (Completed)   Pain of lower extremity, unspecified laterality       pain as outlined - to touch.  no increased erythema.  no calf tenderness.  no swelling.  desires no further w/up or evaluation or treatment.  follow.         Einar Pheasant, MD

## 2017-01-06 DIAGNOSIS — Z853 Personal history of malignant neoplasm of breast: Secondary | ICD-10-CM | POA: Diagnosis not present

## 2017-01-06 DIAGNOSIS — K219 Gastro-esophageal reflux disease without esophagitis: Secondary | ICD-10-CM | POA: Diagnosis not present

## 2017-01-06 DIAGNOSIS — Z8711 Personal history of peptic ulcer disease: Secondary | ICD-10-CM | POA: Diagnosis not present

## 2017-01-06 DIAGNOSIS — D649 Anemia, unspecified: Secondary | ICD-10-CM | POA: Diagnosis not present

## 2017-01-06 DIAGNOSIS — I2510 Atherosclerotic heart disease of native coronary artery without angina pectoris: Secondary | ICD-10-CM | POA: Diagnosis not present

## 2017-01-06 DIAGNOSIS — E039 Hypothyroidism, unspecified: Secondary | ICD-10-CM | POA: Diagnosis not present

## 2017-01-06 DIAGNOSIS — R2681 Unsteadiness on feet: Secondary | ICD-10-CM | POA: Diagnosis not present

## 2017-01-06 DIAGNOSIS — Z951 Presence of aortocoronary bypass graft: Secondary | ICD-10-CM | POA: Diagnosis not present

## 2017-01-06 DIAGNOSIS — M6281 Muscle weakness (generalized): Secondary | ICD-10-CM | POA: Diagnosis not present

## 2017-01-06 DIAGNOSIS — E785 Hyperlipidemia, unspecified: Secondary | ICD-10-CM | POA: Diagnosis not present

## 2017-01-06 DIAGNOSIS — M1991 Primary osteoarthritis, unspecified site: Secondary | ICD-10-CM | POA: Diagnosis not present

## 2017-01-06 DIAGNOSIS — G4700 Insomnia, unspecified: Secondary | ICD-10-CM | POA: Diagnosis not present

## 2017-01-06 DIAGNOSIS — I10 Essential (primary) hypertension: Secondary | ICD-10-CM | POA: Diagnosis not present

## 2017-01-06 DIAGNOSIS — K51011 Ulcerative (chronic) pancolitis with rectal bleeding: Secondary | ICD-10-CM | POA: Diagnosis not present

## 2017-01-07 ENCOUNTER — Encounter: Payer: Self-pay | Admitting: Internal Medicine

## 2017-01-07 NOTE — Assessment & Plan Note (Signed)
Chronic.  Sees dr Sharlet Salina.

## 2017-01-07 NOTE — Assessment & Plan Note (Signed)
Followed by cardiology.  Continue risk factor modification.   

## 2017-01-07 NOTE — Assessment & Plan Note (Signed)
Recently admitted as outlined.  Treated with IV abx initially.  Culture as outlined.  Taken off abx.  Doing well now.  Bowels better.  Eating better.  Weight is better.  Follow.

## 2017-01-07 NOTE — Assessment & Plan Note (Signed)
Found on exam.  No report of pain after discharge.  States feels better.  Eating better.  Bowels better.  She declines any further evaluation. Wants to monitor.  Follow.

## 2017-01-07 NOTE — Assessment & Plan Note (Signed)
Blood pressure under good control.  Continue same medication regimen.  Follow pressures.  Follow metabolic panel.   

## 2017-01-07 NOTE — Assessment & Plan Note (Signed)
On lipitor.  Follow lipid panel and liver function tests.   

## 2017-01-07 NOTE — Assessment & Plan Note (Signed)
She has noticed a palpable area in her breast.  Recent CT documented cystic peripherally calcified breast abnormality.  Discussed with her and her daughter today.  She declines mammogram and any further w/up.

## 2017-01-07 NOTE — Assessment & Plan Note (Signed)
On thyroid replacement.  Follow tsh.  

## 2017-01-07 NOTE — Assessment & Plan Note (Signed)
Follow cbc.  

## 2017-01-08 ENCOUNTER — Other Ambulatory Visit: Payer: Self-pay | Admitting: Internal Medicine

## 2017-01-08 DIAGNOSIS — D649 Anemia, unspecified: Secondary | ICD-10-CM

## 2017-01-08 NOTE — Progress Notes (Signed)
Order placed for f/u labs.  

## 2017-01-09 ENCOUNTER — Telehealth: Payer: Self-pay

## 2017-01-09 DIAGNOSIS — Z8711 Personal history of peptic ulcer disease: Secondary | ICD-10-CM | POA: Diagnosis not present

## 2017-01-09 DIAGNOSIS — E039 Hypothyroidism, unspecified: Secondary | ICD-10-CM | POA: Diagnosis not present

## 2017-01-09 DIAGNOSIS — E785 Hyperlipidemia, unspecified: Secondary | ICD-10-CM | POA: Diagnosis not present

## 2017-01-09 DIAGNOSIS — M1991 Primary osteoarthritis, unspecified site: Secondary | ICD-10-CM | POA: Diagnosis not present

## 2017-01-09 DIAGNOSIS — I10 Essential (primary) hypertension: Secondary | ICD-10-CM | POA: Diagnosis not present

## 2017-01-09 DIAGNOSIS — R2681 Unsteadiness on feet: Secondary | ICD-10-CM | POA: Diagnosis not present

## 2017-01-09 DIAGNOSIS — D649 Anemia, unspecified: Secondary | ICD-10-CM | POA: Diagnosis not present

## 2017-01-09 DIAGNOSIS — K219 Gastro-esophageal reflux disease without esophagitis: Secondary | ICD-10-CM | POA: Diagnosis not present

## 2017-01-09 DIAGNOSIS — Z853 Personal history of malignant neoplasm of breast: Secondary | ICD-10-CM | POA: Diagnosis not present

## 2017-01-09 DIAGNOSIS — G4700 Insomnia, unspecified: Secondary | ICD-10-CM | POA: Diagnosis not present

## 2017-01-09 DIAGNOSIS — I2510 Atherosclerotic heart disease of native coronary artery without angina pectoris: Secondary | ICD-10-CM | POA: Diagnosis not present

## 2017-01-09 DIAGNOSIS — K51011 Ulcerative (chronic) pancolitis with rectal bleeding: Secondary | ICD-10-CM | POA: Diagnosis not present

## 2017-01-09 DIAGNOSIS — M6281 Muscle weakness (generalized): Secondary | ICD-10-CM | POA: Diagnosis not present

## 2017-01-09 DIAGNOSIS — Z951 Presence of aortocoronary bypass graft: Secondary | ICD-10-CM | POA: Diagnosis not present

## 2017-01-09 NOTE — Telephone Encounter (Signed)
Form in folder with list of med changes

## 2017-01-10 DIAGNOSIS — R2681 Unsteadiness on feet: Secondary | ICD-10-CM | POA: Diagnosis not present

## 2017-01-10 DIAGNOSIS — K51011 Ulcerative (chronic) pancolitis with rectal bleeding: Secondary | ICD-10-CM | POA: Diagnosis not present

## 2017-01-10 DIAGNOSIS — Z951 Presence of aortocoronary bypass graft: Secondary | ICD-10-CM | POA: Diagnosis not present

## 2017-01-10 DIAGNOSIS — I10 Essential (primary) hypertension: Secondary | ICD-10-CM | POA: Diagnosis not present

## 2017-01-10 DIAGNOSIS — K219 Gastro-esophageal reflux disease without esophagitis: Secondary | ICD-10-CM | POA: Diagnosis not present

## 2017-01-10 DIAGNOSIS — E785 Hyperlipidemia, unspecified: Secondary | ICD-10-CM | POA: Diagnosis not present

## 2017-01-10 DIAGNOSIS — Z8711 Personal history of peptic ulcer disease: Secondary | ICD-10-CM | POA: Diagnosis not present

## 2017-01-10 DIAGNOSIS — I2510 Atherosclerotic heart disease of native coronary artery without angina pectoris: Secondary | ICD-10-CM | POA: Diagnosis not present

## 2017-01-10 DIAGNOSIS — M6281 Muscle weakness (generalized): Secondary | ICD-10-CM | POA: Diagnosis not present

## 2017-01-10 DIAGNOSIS — D649 Anemia, unspecified: Secondary | ICD-10-CM | POA: Diagnosis not present

## 2017-01-10 DIAGNOSIS — G4700 Insomnia, unspecified: Secondary | ICD-10-CM | POA: Diagnosis not present

## 2017-01-10 DIAGNOSIS — Z853 Personal history of malignant neoplasm of breast: Secondary | ICD-10-CM | POA: Diagnosis not present

## 2017-01-10 DIAGNOSIS — M1991 Primary osteoarthritis, unspecified site: Secondary | ICD-10-CM | POA: Diagnosis not present

## 2017-01-10 DIAGNOSIS — E039 Hypothyroidism, unspecified: Secondary | ICD-10-CM | POA: Diagnosis not present

## 2017-01-10 NOTE — Telephone Encounter (Signed)
Signed and placed in box.  Need to know if pt is taking aspirin.  If no, why did she stop.  Thanks

## 2017-01-10 NOTE — Telephone Encounter (Signed)
Spoke to patient she is taking asa still every day. Have put in folder if you would like to add to ppw.

## 2017-01-10 NOTE — Telephone Encounter (Signed)
Signed and placed in box.   

## 2017-01-10 NOTE — Telephone Encounter (Signed)
Faxed and copy sent to charge

## 2017-01-11 DIAGNOSIS — E785 Hyperlipidemia, unspecified: Secondary | ICD-10-CM | POA: Diagnosis not present

## 2017-01-11 DIAGNOSIS — G4700 Insomnia, unspecified: Secondary | ICD-10-CM | POA: Diagnosis not present

## 2017-01-11 DIAGNOSIS — D649 Anemia, unspecified: Secondary | ICD-10-CM | POA: Diagnosis not present

## 2017-01-11 DIAGNOSIS — Z951 Presence of aortocoronary bypass graft: Secondary | ICD-10-CM | POA: Diagnosis not present

## 2017-01-11 DIAGNOSIS — K219 Gastro-esophageal reflux disease without esophagitis: Secondary | ICD-10-CM | POA: Diagnosis not present

## 2017-01-11 DIAGNOSIS — E039 Hypothyroidism, unspecified: Secondary | ICD-10-CM | POA: Diagnosis not present

## 2017-01-11 DIAGNOSIS — M6281 Muscle weakness (generalized): Secondary | ICD-10-CM | POA: Diagnosis not present

## 2017-01-11 DIAGNOSIS — I10 Essential (primary) hypertension: Secondary | ICD-10-CM | POA: Diagnosis not present

## 2017-01-11 DIAGNOSIS — K51011 Ulcerative (chronic) pancolitis with rectal bleeding: Secondary | ICD-10-CM | POA: Diagnosis not present

## 2017-01-11 DIAGNOSIS — M1991 Primary osteoarthritis, unspecified site: Secondary | ICD-10-CM | POA: Diagnosis not present

## 2017-01-11 DIAGNOSIS — Z853 Personal history of malignant neoplasm of breast: Secondary | ICD-10-CM | POA: Diagnosis not present

## 2017-01-11 DIAGNOSIS — Z8711 Personal history of peptic ulcer disease: Secondary | ICD-10-CM | POA: Diagnosis not present

## 2017-01-11 DIAGNOSIS — R2681 Unsteadiness on feet: Secondary | ICD-10-CM | POA: Diagnosis not present

## 2017-01-11 DIAGNOSIS — I2510 Atherosclerotic heart disease of native coronary artery without angina pectoris: Secondary | ICD-10-CM | POA: Diagnosis not present

## 2017-01-16 ENCOUNTER — Other Ambulatory Visit (INDEPENDENT_AMBULATORY_CARE_PROVIDER_SITE_OTHER): Payer: Medicare Other

## 2017-01-16 DIAGNOSIS — K219 Gastro-esophageal reflux disease without esophagitis: Secondary | ICD-10-CM | POA: Diagnosis not present

## 2017-01-16 DIAGNOSIS — Z853 Personal history of malignant neoplasm of breast: Secondary | ICD-10-CM | POA: Diagnosis not present

## 2017-01-16 DIAGNOSIS — D649 Anemia, unspecified: Secondary | ICD-10-CM | POA: Diagnosis not present

## 2017-01-16 DIAGNOSIS — M6281 Muscle weakness (generalized): Secondary | ICD-10-CM | POA: Diagnosis not present

## 2017-01-16 DIAGNOSIS — R2681 Unsteadiness on feet: Secondary | ICD-10-CM | POA: Diagnosis not present

## 2017-01-16 DIAGNOSIS — K51011 Ulcerative (chronic) pancolitis with rectal bleeding: Secondary | ICD-10-CM | POA: Diagnosis not present

## 2017-01-16 DIAGNOSIS — Z951 Presence of aortocoronary bypass graft: Secondary | ICD-10-CM | POA: Diagnosis not present

## 2017-01-16 DIAGNOSIS — E039 Hypothyroidism, unspecified: Secondary | ICD-10-CM

## 2017-01-16 DIAGNOSIS — I10 Essential (primary) hypertension: Secondary | ICD-10-CM | POA: Diagnosis not present

## 2017-01-16 DIAGNOSIS — G4700 Insomnia, unspecified: Secondary | ICD-10-CM | POA: Diagnosis not present

## 2017-01-16 DIAGNOSIS — E785 Hyperlipidemia, unspecified: Secondary | ICD-10-CM | POA: Diagnosis not present

## 2017-01-16 DIAGNOSIS — I2510 Atherosclerotic heart disease of native coronary artery without angina pectoris: Secondary | ICD-10-CM | POA: Diagnosis not present

## 2017-01-16 DIAGNOSIS — Z8711 Personal history of peptic ulcer disease: Secondary | ICD-10-CM | POA: Diagnosis not present

## 2017-01-16 DIAGNOSIS — M1991 Primary osteoarthritis, unspecified site: Secondary | ICD-10-CM | POA: Diagnosis not present

## 2017-01-16 LAB — IBC PANEL
Iron: 52 ug/dL (ref 42–145)
Saturation Ratios: 17.4 % — ABNORMAL LOW (ref 20.0–50.0)
Transferrin: 214 mg/dL (ref 212.0–360.0)

## 2017-01-16 LAB — CBC WITH DIFFERENTIAL/PLATELET
Basophils Absolute: 0 10*3/uL (ref 0.0–0.1)
Basophils Relative: 0.7 % (ref 0.0–3.0)
EOS PCT: 3.1 % (ref 0.0–5.0)
Eosinophils Absolute: 0.2 10*3/uL (ref 0.0–0.7)
HCT: 34.6 % — ABNORMAL LOW (ref 36.0–46.0)
Hemoglobin: 11.3 g/dL — ABNORMAL LOW (ref 12.0–15.0)
LYMPHS ABS: 2.4 10*3/uL (ref 0.7–4.0)
Lymphocytes Relative: 37.6 % (ref 12.0–46.0)
MCHC: 32.7 g/dL (ref 30.0–36.0)
MCV: 93.2 fl (ref 78.0–100.0)
MONOS PCT: 9.7 % (ref 3.0–12.0)
Monocytes Absolute: 0.6 10*3/uL (ref 0.1–1.0)
NEUTROS ABS: 3.2 10*3/uL (ref 1.4–7.7)
NEUTROS PCT: 48.9 % (ref 43.0–77.0)
PLATELETS: 285 10*3/uL (ref 150.0–400.0)
RBC: 3.71 Mil/uL — ABNORMAL LOW (ref 3.87–5.11)
RDW: 15.6 % — ABNORMAL HIGH (ref 11.5–15.5)
WBC: 6.5 10*3/uL (ref 4.0–10.5)

## 2017-01-16 LAB — TSH: TSH: 2.15 u[IU]/mL (ref 0.35–4.50)

## 2017-01-17 LAB — MISCELLANEOUS TEST

## 2017-01-20 DIAGNOSIS — G4700 Insomnia, unspecified: Secondary | ICD-10-CM | POA: Diagnosis not present

## 2017-01-20 DIAGNOSIS — M1991 Primary osteoarthritis, unspecified site: Secondary | ICD-10-CM | POA: Diagnosis not present

## 2017-01-20 DIAGNOSIS — Z951 Presence of aortocoronary bypass graft: Secondary | ICD-10-CM | POA: Diagnosis not present

## 2017-01-20 DIAGNOSIS — K219 Gastro-esophageal reflux disease without esophagitis: Secondary | ICD-10-CM | POA: Diagnosis not present

## 2017-01-20 DIAGNOSIS — E785 Hyperlipidemia, unspecified: Secondary | ICD-10-CM | POA: Diagnosis not present

## 2017-01-20 DIAGNOSIS — D649 Anemia, unspecified: Secondary | ICD-10-CM | POA: Diagnosis not present

## 2017-01-20 DIAGNOSIS — I2510 Atherosclerotic heart disease of native coronary artery without angina pectoris: Secondary | ICD-10-CM | POA: Diagnosis not present

## 2017-01-20 DIAGNOSIS — M6281 Muscle weakness (generalized): Secondary | ICD-10-CM | POA: Diagnosis not present

## 2017-01-20 DIAGNOSIS — K51011 Ulcerative (chronic) pancolitis with rectal bleeding: Secondary | ICD-10-CM | POA: Diagnosis not present

## 2017-01-20 DIAGNOSIS — R2681 Unsteadiness on feet: Secondary | ICD-10-CM | POA: Diagnosis not present

## 2017-01-20 DIAGNOSIS — I10 Essential (primary) hypertension: Secondary | ICD-10-CM | POA: Diagnosis not present

## 2017-01-20 DIAGNOSIS — Z853 Personal history of malignant neoplasm of breast: Secondary | ICD-10-CM | POA: Diagnosis not present

## 2017-01-20 DIAGNOSIS — E039 Hypothyroidism, unspecified: Secondary | ICD-10-CM | POA: Diagnosis not present

## 2017-01-20 DIAGNOSIS — Z8711 Personal history of peptic ulcer disease: Secondary | ICD-10-CM | POA: Diagnosis not present

## 2017-01-22 ENCOUNTER — Other Ambulatory Visit: Payer: Medicare Other

## 2017-01-23 DIAGNOSIS — K51011 Ulcerative (chronic) pancolitis with rectal bleeding: Secondary | ICD-10-CM | POA: Diagnosis not present

## 2017-01-23 DIAGNOSIS — I10 Essential (primary) hypertension: Secondary | ICD-10-CM | POA: Diagnosis not present

## 2017-01-23 DIAGNOSIS — Z853 Personal history of malignant neoplasm of breast: Secondary | ICD-10-CM | POA: Diagnosis not present

## 2017-01-23 DIAGNOSIS — G4700 Insomnia, unspecified: Secondary | ICD-10-CM | POA: Diagnosis not present

## 2017-01-23 DIAGNOSIS — Z8711 Personal history of peptic ulcer disease: Secondary | ICD-10-CM | POA: Diagnosis not present

## 2017-01-23 DIAGNOSIS — E785 Hyperlipidemia, unspecified: Secondary | ICD-10-CM | POA: Diagnosis not present

## 2017-01-23 DIAGNOSIS — M1991 Primary osteoarthritis, unspecified site: Secondary | ICD-10-CM | POA: Diagnosis not present

## 2017-01-23 DIAGNOSIS — Z951 Presence of aortocoronary bypass graft: Secondary | ICD-10-CM | POA: Diagnosis not present

## 2017-01-23 DIAGNOSIS — E039 Hypothyroidism, unspecified: Secondary | ICD-10-CM | POA: Diagnosis not present

## 2017-01-23 DIAGNOSIS — K219 Gastro-esophageal reflux disease without esophagitis: Secondary | ICD-10-CM | POA: Diagnosis not present

## 2017-01-23 DIAGNOSIS — D649 Anemia, unspecified: Secondary | ICD-10-CM | POA: Diagnosis not present

## 2017-01-23 DIAGNOSIS — I2510 Atherosclerotic heart disease of native coronary artery without angina pectoris: Secondary | ICD-10-CM | POA: Diagnosis not present

## 2017-01-23 DIAGNOSIS — M6281 Muscle weakness (generalized): Secondary | ICD-10-CM | POA: Diagnosis not present

## 2017-01-23 DIAGNOSIS — R2681 Unsteadiness on feet: Secondary | ICD-10-CM | POA: Diagnosis not present

## 2017-01-24 DIAGNOSIS — E785 Hyperlipidemia, unspecified: Secondary | ICD-10-CM | POA: Diagnosis not present

## 2017-01-24 DIAGNOSIS — M1991 Primary osteoarthritis, unspecified site: Secondary | ICD-10-CM | POA: Diagnosis not present

## 2017-01-24 DIAGNOSIS — R2681 Unsteadiness on feet: Secondary | ICD-10-CM | POA: Diagnosis not present

## 2017-01-24 DIAGNOSIS — G4700 Insomnia, unspecified: Secondary | ICD-10-CM | POA: Diagnosis not present

## 2017-01-24 DIAGNOSIS — D649 Anemia, unspecified: Secondary | ICD-10-CM | POA: Diagnosis not present

## 2017-01-24 DIAGNOSIS — I10 Essential (primary) hypertension: Secondary | ICD-10-CM | POA: Diagnosis not present

## 2017-01-24 DIAGNOSIS — K219 Gastro-esophageal reflux disease without esophagitis: Secondary | ICD-10-CM | POA: Diagnosis not present

## 2017-01-24 DIAGNOSIS — Z8711 Personal history of peptic ulcer disease: Secondary | ICD-10-CM | POA: Diagnosis not present

## 2017-01-24 DIAGNOSIS — Z853 Personal history of malignant neoplasm of breast: Secondary | ICD-10-CM | POA: Diagnosis not present

## 2017-01-24 DIAGNOSIS — Z951 Presence of aortocoronary bypass graft: Secondary | ICD-10-CM | POA: Diagnosis not present

## 2017-01-24 DIAGNOSIS — K51011 Ulcerative (chronic) pancolitis with rectal bleeding: Secondary | ICD-10-CM | POA: Diagnosis not present

## 2017-01-24 DIAGNOSIS — M6281 Muscle weakness (generalized): Secondary | ICD-10-CM | POA: Diagnosis not present

## 2017-01-24 DIAGNOSIS — E039 Hypothyroidism, unspecified: Secondary | ICD-10-CM | POA: Diagnosis not present

## 2017-01-24 DIAGNOSIS — I2510 Atherosclerotic heart disease of native coronary artery without angina pectoris: Secondary | ICD-10-CM | POA: Diagnosis not present

## 2017-01-25 NOTE — Telephone Encounter (Signed)
Scheduled 02/02/17

## 2017-01-31 DIAGNOSIS — Z853 Personal history of malignant neoplasm of breast: Secondary | ICD-10-CM | POA: Diagnosis not present

## 2017-01-31 DIAGNOSIS — I2510 Atherosclerotic heart disease of native coronary artery without angina pectoris: Secondary | ICD-10-CM | POA: Diagnosis not present

## 2017-01-31 DIAGNOSIS — K51011 Ulcerative (chronic) pancolitis with rectal bleeding: Secondary | ICD-10-CM | POA: Diagnosis not present

## 2017-01-31 DIAGNOSIS — D649 Anemia, unspecified: Secondary | ICD-10-CM | POA: Diagnosis not present

## 2017-01-31 DIAGNOSIS — Z951 Presence of aortocoronary bypass graft: Secondary | ICD-10-CM | POA: Diagnosis not present

## 2017-01-31 DIAGNOSIS — E039 Hypothyroidism, unspecified: Secondary | ICD-10-CM | POA: Diagnosis not present

## 2017-01-31 DIAGNOSIS — K219 Gastro-esophageal reflux disease without esophagitis: Secondary | ICD-10-CM | POA: Diagnosis not present

## 2017-01-31 DIAGNOSIS — G4700 Insomnia, unspecified: Secondary | ICD-10-CM | POA: Diagnosis not present

## 2017-01-31 DIAGNOSIS — M1991 Primary osteoarthritis, unspecified site: Secondary | ICD-10-CM | POA: Diagnosis not present

## 2017-01-31 DIAGNOSIS — E785 Hyperlipidemia, unspecified: Secondary | ICD-10-CM | POA: Diagnosis not present

## 2017-01-31 DIAGNOSIS — R2681 Unsteadiness on feet: Secondary | ICD-10-CM | POA: Diagnosis not present

## 2017-01-31 DIAGNOSIS — I10 Essential (primary) hypertension: Secondary | ICD-10-CM | POA: Diagnosis not present

## 2017-01-31 DIAGNOSIS — M6281 Muscle weakness (generalized): Secondary | ICD-10-CM | POA: Diagnosis not present

## 2017-01-31 DIAGNOSIS — Z8711 Personal history of peptic ulcer disease: Secondary | ICD-10-CM | POA: Diagnosis not present

## 2017-02-01 DIAGNOSIS — Z853 Personal history of malignant neoplasm of breast: Secondary | ICD-10-CM | POA: Diagnosis not present

## 2017-02-01 DIAGNOSIS — I2510 Atherosclerotic heart disease of native coronary artery without angina pectoris: Secondary | ICD-10-CM | POA: Diagnosis not present

## 2017-02-01 DIAGNOSIS — M6281 Muscle weakness (generalized): Secondary | ICD-10-CM | POA: Diagnosis not present

## 2017-02-01 DIAGNOSIS — E785 Hyperlipidemia, unspecified: Secondary | ICD-10-CM | POA: Diagnosis not present

## 2017-02-01 DIAGNOSIS — D649 Anemia, unspecified: Secondary | ICD-10-CM | POA: Diagnosis not present

## 2017-02-01 DIAGNOSIS — K219 Gastro-esophageal reflux disease without esophagitis: Secondary | ICD-10-CM | POA: Diagnosis not present

## 2017-02-01 DIAGNOSIS — R2681 Unsteadiness on feet: Secondary | ICD-10-CM | POA: Diagnosis not present

## 2017-02-01 DIAGNOSIS — G4700 Insomnia, unspecified: Secondary | ICD-10-CM | POA: Diagnosis not present

## 2017-02-01 DIAGNOSIS — M1991 Primary osteoarthritis, unspecified site: Secondary | ICD-10-CM | POA: Diagnosis not present

## 2017-02-01 DIAGNOSIS — Z8711 Personal history of peptic ulcer disease: Secondary | ICD-10-CM | POA: Diagnosis not present

## 2017-02-01 DIAGNOSIS — K51011 Ulcerative (chronic) pancolitis with rectal bleeding: Secondary | ICD-10-CM | POA: Diagnosis not present

## 2017-02-01 DIAGNOSIS — E039 Hypothyroidism, unspecified: Secondary | ICD-10-CM | POA: Diagnosis not present

## 2017-02-01 DIAGNOSIS — Z951 Presence of aortocoronary bypass graft: Secondary | ICD-10-CM | POA: Diagnosis not present

## 2017-02-01 DIAGNOSIS — I10 Essential (primary) hypertension: Secondary | ICD-10-CM | POA: Diagnosis not present

## 2017-02-02 ENCOUNTER — Ambulatory Visit: Payer: Medicare Other

## 2017-02-02 DIAGNOSIS — Z951 Presence of aortocoronary bypass graft: Secondary | ICD-10-CM | POA: Diagnosis not present

## 2017-02-02 DIAGNOSIS — M1991 Primary osteoarthritis, unspecified site: Secondary | ICD-10-CM | POA: Diagnosis not present

## 2017-02-02 DIAGNOSIS — Z853 Personal history of malignant neoplasm of breast: Secondary | ICD-10-CM | POA: Diagnosis not present

## 2017-02-02 DIAGNOSIS — I10 Essential (primary) hypertension: Secondary | ICD-10-CM | POA: Diagnosis not present

## 2017-02-02 DIAGNOSIS — I2510 Atherosclerotic heart disease of native coronary artery without angina pectoris: Secondary | ICD-10-CM | POA: Diagnosis not present

## 2017-02-02 DIAGNOSIS — K51011 Ulcerative (chronic) pancolitis with rectal bleeding: Secondary | ICD-10-CM | POA: Diagnosis not present

## 2017-02-02 DIAGNOSIS — G4700 Insomnia, unspecified: Secondary | ICD-10-CM | POA: Diagnosis not present

## 2017-02-02 DIAGNOSIS — Z8711 Personal history of peptic ulcer disease: Secondary | ICD-10-CM | POA: Diagnosis not present

## 2017-02-02 DIAGNOSIS — K219 Gastro-esophageal reflux disease without esophagitis: Secondary | ICD-10-CM | POA: Diagnosis not present

## 2017-02-02 DIAGNOSIS — E785 Hyperlipidemia, unspecified: Secondary | ICD-10-CM | POA: Diagnosis not present

## 2017-02-02 DIAGNOSIS — R2681 Unsteadiness on feet: Secondary | ICD-10-CM | POA: Diagnosis not present

## 2017-02-02 DIAGNOSIS — D649 Anemia, unspecified: Secondary | ICD-10-CM | POA: Diagnosis not present

## 2017-02-02 DIAGNOSIS — M6281 Muscle weakness (generalized): Secondary | ICD-10-CM | POA: Diagnosis not present

## 2017-02-02 DIAGNOSIS — E039 Hypothyroidism, unspecified: Secondary | ICD-10-CM | POA: Diagnosis not present

## 2017-02-06 DIAGNOSIS — I10 Essential (primary) hypertension: Secondary | ICD-10-CM | POA: Diagnosis not present

## 2017-02-06 DIAGNOSIS — D649 Anemia, unspecified: Secondary | ICD-10-CM | POA: Diagnosis not present

## 2017-02-06 DIAGNOSIS — G4700 Insomnia, unspecified: Secondary | ICD-10-CM | POA: Diagnosis not present

## 2017-02-06 DIAGNOSIS — I2510 Atherosclerotic heart disease of native coronary artery without angina pectoris: Secondary | ICD-10-CM | POA: Diagnosis not present

## 2017-02-06 DIAGNOSIS — Z853 Personal history of malignant neoplasm of breast: Secondary | ICD-10-CM | POA: Diagnosis not present

## 2017-02-06 DIAGNOSIS — E785 Hyperlipidemia, unspecified: Secondary | ICD-10-CM | POA: Diagnosis not present

## 2017-02-06 DIAGNOSIS — R2681 Unsteadiness on feet: Secondary | ICD-10-CM | POA: Diagnosis not present

## 2017-02-06 DIAGNOSIS — E039 Hypothyroidism, unspecified: Secondary | ICD-10-CM | POA: Diagnosis not present

## 2017-02-06 DIAGNOSIS — Z951 Presence of aortocoronary bypass graft: Secondary | ICD-10-CM | POA: Diagnosis not present

## 2017-02-06 DIAGNOSIS — M1991 Primary osteoarthritis, unspecified site: Secondary | ICD-10-CM | POA: Diagnosis not present

## 2017-02-06 DIAGNOSIS — K51011 Ulcerative (chronic) pancolitis with rectal bleeding: Secondary | ICD-10-CM | POA: Diagnosis not present

## 2017-02-06 DIAGNOSIS — M6281 Muscle weakness (generalized): Secondary | ICD-10-CM | POA: Diagnosis not present

## 2017-02-06 DIAGNOSIS — Z8711 Personal history of peptic ulcer disease: Secondary | ICD-10-CM | POA: Diagnosis not present

## 2017-02-06 DIAGNOSIS — K219 Gastro-esophageal reflux disease without esophagitis: Secondary | ICD-10-CM | POA: Diagnosis not present

## 2017-02-08 DIAGNOSIS — Z8711 Personal history of peptic ulcer disease: Secondary | ICD-10-CM | POA: Diagnosis not present

## 2017-02-08 DIAGNOSIS — K51011 Ulcerative (chronic) pancolitis with rectal bleeding: Secondary | ICD-10-CM | POA: Diagnosis not present

## 2017-02-08 DIAGNOSIS — I10 Essential (primary) hypertension: Secondary | ICD-10-CM | POA: Diagnosis not present

## 2017-02-08 DIAGNOSIS — G4700 Insomnia, unspecified: Secondary | ICD-10-CM | POA: Diagnosis not present

## 2017-02-08 DIAGNOSIS — M6281 Muscle weakness (generalized): Secondary | ICD-10-CM | POA: Diagnosis not present

## 2017-02-08 DIAGNOSIS — M1991 Primary osteoarthritis, unspecified site: Secondary | ICD-10-CM | POA: Diagnosis not present

## 2017-02-08 DIAGNOSIS — E785 Hyperlipidemia, unspecified: Secondary | ICD-10-CM | POA: Diagnosis not present

## 2017-02-08 DIAGNOSIS — Z853 Personal history of malignant neoplasm of breast: Secondary | ICD-10-CM | POA: Diagnosis not present

## 2017-02-08 DIAGNOSIS — R2681 Unsteadiness on feet: Secondary | ICD-10-CM | POA: Diagnosis not present

## 2017-02-08 DIAGNOSIS — I2510 Atherosclerotic heart disease of native coronary artery without angina pectoris: Secondary | ICD-10-CM | POA: Diagnosis not present

## 2017-02-08 DIAGNOSIS — D649 Anemia, unspecified: Secondary | ICD-10-CM | POA: Diagnosis not present

## 2017-02-08 DIAGNOSIS — K219 Gastro-esophageal reflux disease without esophagitis: Secondary | ICD-10-CM | POA: Diagnosis not present

## 2017-02-08 DIAGNOSIS — Z951 Presence of aortocoronary bypass graft: Secondary | ICD-10-CM | POA: Diagnosis not present

## 2017-02-08 DIAGNOSIS — E039 Hypothyroidism, unspecified: Secondary | ICD-10-CM | POA: Diagnosis not present

## 2017-02-12 ENCOUNTER — Encounter: Payer: Self-pay | Admitting: Emergency Medicine

## 2017-02-12 ENCOUNTER — Emergency Department: Payer: Medicare Other

## 2017-02-12 ENCOUNTER — Emergency Department
Admission: EM | Admit: 2017-02-12 | Discharge: 2017-02-12 | Disposition: A | Discharge status: Home / Self Care | Payer: Medicare Other | Attending: Emergency Medicine | Admitting: Emergency Medicine

## 2017-02-12 DIAGNOSIS — W19XXXA Unspecified fall, initial encounter: Secondary | ICD-10-CM | POA: Diagnosis not present

## 2017-02-12 DIAGNOSIS — W01198A Fall on same level from slipping, tripping and stumbling with subsequent striking against other object, initial encounter: Secondary | ICD-10-CM | POA: Insufficient documentation

## 2017-02-12 DIAGNOSIS — Y939 Activity, unspecified: Secondary | ICD-10-CM | POA: Diagnosis not present

## 2017-02-12 DIAGNOSIS — Z853 Personal history of malignant neoplasm of breast: Secondary | ICD-10-CM | POA: Insufficient documentation

## 2017-02-12 DIAGNOSIS — Y999 Unspecified external cause status: Secondary | ICD-10-CM | POA: Insufficient documentation

## 2017-02-12 DIAGNOSIS — Z79899 Other long term (current) drug therapy: Secondary | ICD-10-CM | POA: Diagnosis not present

## 2017-02-12 DIAGNOSIS — Y929 Unspecified place or not applicable: Secondary | ICD-10-CM | POA: Diagnosis not present

## 2017-02-12 DIAGNOSIS — I2510 Atherosclerotic heart disease of native coronary artery without angina pectoris: Secondary | ICD-10-CM | POA: Insufficient documentation

## 2017-02-12 DIAGNOSIS — S22000A Wedge compression fracture of unspecified thoracic vertebra, initial encounter for closed fracture: Secondary | ICD-10-CM | POA: Diagnosis not present

## 2017-02-12 DIAGNOSIS — S22009A Unspecified fracture of unspecified thoracic vertebra, initial encounter for closed fracture: Secondary | ICD-10-CM | POA: Diagnosis not present

## 2017-02-12 DIAGNOSIS — E039 Hypothyroidism, unspecified: Secondary | ICD-10-CM | POA: Insufficient documentation

## 2017-02-12 DIAGNOSIS — I10 Essential (primary) hypertension: Secondary | ICD-10-CM | POA: Diagnosis not present

## 2017-02-12 DIAGNOSIS — Z7982 Long term (current) use of aspirin: Secondary | ICD-10-CM | POA: Insufficient documentation

## 2017-02-12 DIAGNOSIS — S0990XA Unspecified injury of head, initial encounter: Secondary | ICD-10-CM | POA: Diagnosis not present

## 2017-02-12 DIAGNOSIS — S0101XA Laceration without foreign body of scalp, initial encounter: Secondary | ICD-10-CM | POA: Insufficient documentation

## 2017-02-12 DIAGNOSIS — R296 Repeated falls: Secondary | ICD-10-CM | POA: Diagnosis not present

## 2017-02-12 DIAGNOSIS — M546 Pain in thoracic spine: Secondary | ICD-10-CM | POA: Diagnosis not present

## 2017-02-12 LAB — COMPREHENSIVE METABOLIC PANEL
ALBUMIN: 3.1 g/dL — AB (ref 3.5–5.0)
ALT: 14 U/L (ref 14–54)
ANION GAP: 8 (ref 5–15)
AST: 36 U/L (ref 15–41)
Alkaline Phosphatase: 55 U/L (ref 38–126)
BILIRUBIN TOTAL: 0.5 mg/dL (ref 0.3–1.2)
BUN: 39 mg/dL — ABNORMAL HIGH (ref 6–20)
CHLORIDE: 104 mmol/L (ref 101–111)
CO2: 25 mmol/L (ref 22–32)
Calcium: 8.8 mg/dL — ABNORMAL LOW (ref 8.9–10.3)
Creatinine, Ser: 1.3 mg/dL — ABNORMAL HIGH (ref 0.44–1.00)
GFR calc Af Amer: 41 mL/min — ABNORMAL LOW (ref >60)
GFR calc non Af Amer: 35 mL/min — ABNORMAL LOW (ref >60)
GLUCOSE: 114 mg/dL — AB (ref 65–99)
POTASSIUM: 4.3 mmol/L (ref 3.5–5.1)
SODIUM: 137 mmol/L (ref 135–145)
TOTAL PROTEIN: 6.3 g/dL — AB (ref 6.5–8.1)

## 2017-02-12 LAB — TROPONIN I: Troponin I: <0.03 ng/mL (ref <0.03)

## 2017-02-12 LAB — CBC
HCT: 33 % — ABNORMAL LOW (ref 35.0–47.0)
Hemoglobin: 11.2 g/dL — ABNORMAL LOW (ref 12.0–16.0)
MCH: 30.7 pg (ref 26.0–34.0)
MCHC: 34.1 g/dL (ref 32.0–36.0)
MCV: 90 fL (ref 80.0–100.0)
PLATELETS: 296 10*3/uL (ref 150–440)
RBC: 3.67 MIL/uL — ABNORMAL LOW (ref 3.80–5.20)
RDW: 14.5 % (ref 11.5–14.5)
WBC: 14 10*3/uL — ABNORMAL HIGH (ref 3.6–11.0)

## 2017-02-12 NOTE — ED Notes (Signed)
Laceration and staples wrapped a this time with clean gauze

## 2017-02-12 NOTE — ED Triage Notes (Signed)
Pt to ED via EMS from home, pt had mechanical fall this am and hit head on door knob, pt has lac to head, bleeding controlled. Pt denies any LOC, A&Ox4, VS stable. PT also states intermit CP x2wks. MD at bedside

## 2017-02-12 NOTE — ED Provider Notes (Signed)
[facility] Emergency Department Provider Note   ____________________________________________    I have reviewed the triage vital signs and the nursing notes.   HISTORY  Chief Complaint Fall     HPI Angela Howe is a 83 y.o. female who presents after a fall. Patient reports she bumped into a door and fell backwards and hit her head on the doorknob. She did suffer a laceration to her scalp. She denies LOC, she briefly felt dizzy after this occurred. Currently she feels well. She notes that she did have some chest pain earlier in the week which has resolved. No shortness of breath. No extremity injury, she does complain of some back pain that is new. No neck pain. noAbdominal pain. No nausea or vomiting  Past Medical History:  Diagnosis Date  . Breast cancer ([place])   . CAD (coronary artery disease)   . Degenerative disc disease   . GERD (gastroesophageal reflux disease)   . Hyperlipidemia    Previous intolerance of statins  . Hypothyroidism   . Orthostatic hypotension   . Osteoarthritis   . Peptic ulcer disease   . Syncope     Patient Active Problem List   Diagnosis Date Noted  . Pancolitis ([place]) 12/21/2016  . Unsteady gait 08/13/2016  . Falls frequently 07/04/2016  . Chest pain 10/12/2015  . Rib fracture 10/03/2015  . Right carotid bruit 08/01/2015  . Loss of weight 01/12/2015  . Anemia 07/18/2014  . Health care maintenance 07/18/2014  . Hoarseness 07/18/2014  . Abdominal pain 05/05/2014  . Back pain 10/05/2013  . Knee pain 07/14/2013  . Oral pain 05/06/2013  . Itching 01/05/2013  . History of breast cancer 11/20/2012  . Syncope 11/20/2012  . Hypothyroidism 08/10/2012  . Difficulty sleeping 08/10/2012  . Dizziness 11/08/2010  . Hyperlipemia 03/23/2009  . HYPERTENSION, BENIGN 03/23/2009  . CAD, NATIVE VESSEL 09/24/2008  . ORTHOSTATIC HYPOTENSION 09/24/2008  . ABNORMAL EKG 09/24/2008    Past Surgical History:  Procedure  Laterality Date  . ABDOMINAL HYSTERECTOMY  1970  . APPENDECTOMY    . BIOPSY BREAST  2014  . BREAST EXCISIONAL BIOPSY Left 2014   neg  . BREAST EXCISIONAL BIOPSY Left 2006   postive radation  . BREAST LUMPECTOMY  2006  . CHOLECYSTECTOMY    . CORONARY ARTERY BYPASS GRAFT  05/2006   x3  . NASAL SINUS SURGERY    . ROTATOR CUFF REPAIR      Prior to Admission medications   Medication Sig Start Date End Date Taking? Authorizing Provider  amitriptyline (ELAVIL) 50 MG tablet TAKE 1 TABLET BY MOUTH AT BEDTIME 12/08/16  Yes Einar Pheasant, MD  aspirin EC 81 MG tablet Take 81 mg by mouth daily.   Yes [provider]  atorvastatin (LIPITOR) 20 MG tablet Take 1 tablet (20 mg total) by mouth daily. 08/08/16  Yes Einar Pheasant, MD  citalopram (CELEXA) 20 MG tablet Take 1 tablet (20 mg total) by mouth daily. 08/08/16  Yes Einar Pheasant, MD  fentaNYL (DURAGESIC - DOSED MCG/HR) 12 MCG/HR Place 1 patch (12.5 mcg total) onto the skin every 3 (three) days. 10/13/15  Yes Wieting, Richard, MD  fludrocortisone (FLORINEF) 0.1 MG tablet Take 1 tablet (0.1 mg total) by mouth daily. 03/22/15  Yes Einar Pheasant, MD  lansoprazole (PREVACID) 30 MG capsule Take 30 mg by mouth daily.     Yes [provider]  levothyroxine (SYNTHROID, LEVOTHROID) 50 MCG tablet Take 1 tablet (50 mcg total) by mouth  daily. 08/08/16  Yes Einar Pheasant, MD  nitroGLYCERIN (NITROSTAT) 0.4 MG SL tablet Place 1 tablet (0.4 mg total) under the tongue every 5 (five) minutes as needed for chest pain. 07/10/14   Einar Pheasant, MD     Allergies Alendronate sodium and Prednisone  Family History  Problem Relation Age of Onset  . Heart attack Sister   . Cancer Brother   . Pancreatic cancer Brother   . Ovarian cancer Sister     Social History Social History  Substance Use Topics  . Smoking status: Never Smoker  . Smokeless tobacco: Never Used  . Alcohol use No    Review of Systems  Constitutional: No  fever/chills Eyes: No visual changes.  ENT: No neck pain Cardiovascular: Denies chest pain. Respiratory: Denies shortness of breath. Gastrointestinal: No abdominal pain.  No nausea, no vomiting.   Genitourinary: Negative for dysuria. Musculoskeletal: Negative for back pain. Skin: positive for laceration Neurological: Negative for headaches    ____________________________________________   PHYSICAL EXAM:  VITAL SIGNS: ED Triage Vitals [02/12/17 1208]  Enc Vitals Group     BP (!) 163/74     Pulse Rate 82     Resp 16     Temp 98.4 F (36.9 C)     Temp Source Oral     SpO2 98 %     Weight 49.9 kg (110 lb)     Height 1.626 m (5\' 4" )     Head Circumference      Peak Flow      Pain Score      Pain Loc      Pain Edu?      Excl. in [place]?     Constitutional: Alert and oriented. No acute distress. Pleasant and interactive Eyes: Conjunctivae are normal.  head: Approximately 6 cm laceration along the central left parietal area, bili intact Nose: No congestion/rhinnorhea. Mouth/Throat: Mucous membranes are moist.    Cardiovascular: Normal rate, regular rhythm. Grossly normal heart sounds.  Good peripheral circulation. Respiratory: Normal respiratory effort.  No retractions. Lungs CTAB. Gastrointestinal: Soft and nontender. No distention.  No CVA tenderness.  Musculoskeletal:  full range of motion of all extremities. Mild tenderness to palpation of the thoracic vertebrae including lumbar vertebrae but no bony abnormality. Neurologic:  Normal speech and language. No gross focal neurologic deficits are appreciated.  Skin:  Skin is warm, dry. Laceration as above Psychiatric: Mood and affect are normal. Speech and behavior are normal.  ____________________________________________   LABS (all labs ordered are listed, but only abnormal results are displayed)  Labs Reviewed  CBC - Abnormal; Notable for the following:       Result Value   WBC 14.0 (*)    RBC 3.67 (*)     Hemoglobin 11.2 (*)    HCT 33.0 (*)    All other components within normal limits  COMPREHENSIVE METABOLIC PANEL - Abnormal; Notable for the following:    Glucose, Bld 114 (*)    BUN 39 (*)    Creatinine, Ser 1.30 (*)    Calcium 8.8 (*)    Total Protein 6.3 (*)    Albumin 3.1 (*)    GFR calc non Af Amer 35 (*)    GFR calc Af Amer 41 (*)    All other components within normal limits  TROPONIN I  URINALYSIS, COMPLETE (UACMP) WITH MICROSCOPIC   ____________________________________________  EKG  None ____________________________________________  RADIOLOGY  new mild anterior compression of the body of T10, stable compression fractures of T5  T8 T9 and T11 Ct head unremarkable ____________________________________________   PROCEDURES  Procedure(s) performed: yes  LACERATION REPAIR Performed by: Lavonia Drafts Authorized by: Lavonia Drafts Consent: Verbal consent obtained.  Consent given by: patient     Laceration Location: scalp  Laceration Length: 6cm  No Foreign Bodies seen or palpated  Anesthesia: local infiltration  Local anesthetic: lidocaine 1%   Anesthetic total: 4 ml  Irrigation method: syringe Amount of cleaning: standard  Skin closure: staples  Number of sutures: 5    Patient tolerance: Patient tolerated the procedure well with no immediate complications.     Critical Care performed: No ____________________________________________   INITIAL IMPRESSION / ASSESSMENT AND PLAN / ED COURSE  Pertinent labs & imaging results that were available during my care of the patient were reviewed by me and considered in my medical decision making (see chart for details).  Patient presents after mechanical fall. Complains primarily of scalp laceration and diffuse back pain.   CT head reassuring  Chronic compression fx's on xray, likely new T10 fx. Pain control and outpatient follow up  Laceration repaired with staples, staple removal in 5-6 days.       ____________________________________________   FINAL CLINICAL IMPRESSION(S) / ED DIAGNOSES  Final diagnoses:  Fall, initial encounter  Laceration of scalp, initial encounter  Compression fracture of body of thoracic vertebra (HCC)      NEW MEDICATIONS STARTED DURING THIS VISIT:  New Prescriptions   No medications on file     Note:  This document was prepared using Dragon voice recognition software and may include unintentional dictation errors.    Lavonia Drafts, MD 02/12/17 [phone]

## 2017-02-12 NOTE — ED Notes (Signed)
Family and pt verbalizes d/c teaching and follow up, pt in NAD at time of d/c. Pt in wheelchair to lobby with family. Pt unable to sign due to signature pad malfnx

## 2017-02-12 NOTE — Discharge Instructions (Signed)
Please follow up with your doctor for staple removal in approximately 5-6 days.  You have a mild compression fracture that appears new. You have several compression fractures that are old. These typically heal on their own. Take tylenol as needed for pain

## 2017-02-14 ENCOUNTER — Emergency Department: Payer: Medicare Other

## 2017-02-14 ENCOUNTER — Telehealth: Payer: Self-pay | Admitting: Internal Medicine

## 2017-02-14 ENCOUNTER — Emergency Department
Admission: EM | Admit: 2017-02-14 | Discharge: 2017-02-15 | Disposition: A | Discharge status: Home / Self Care | Payer: Medicare Other | Attending: Emergency Medicine | Admitting: Emergency Medicine

## 2017-02-14 ENCOUNTER — Encounter: Payer: Self-pay | Admitting: *Deleted

## 2017-02-14 ENCOUNTER — Ambulatory Visit
Admission: EM | Admit: 2017-02-14 | Discharge: 2017-02-14 | Disposition: A | Discharge status: Home / Self Care | Payer: Medicare Other | Attending: Family Medicine | Admitting: Family Medicine

## 2017-02-14 DIAGNOSIS — R296 Repeated falls: Secondary | ICD-10-CM

## 2017-02-14 DIAGNOSIS — E039 Hypothyroidism, unspecified: Secondary | ICD-10-CM | POA: Diagnosis not present

## 2017-02-14 DIAGNOSIS — S22000D Wedge compression fracture of unspecified thoracic vertebra, subsequent encounter for fracture with routine healing: Secondary | ICD-10-CM | POA: Diagnosis not present

## 2017-02-14 DIAGNOSIS — Z79899 Other long term (current) drug therapy: Secondary | ICD-10-CM | POA: Insufficient documentation

## 2017-02-14 DIAGNOSIS — Z8711 Personal history of peptic ulcer disease: Secondary | ICD-10-CM | POA: Diagnosis not present

## 2017-02-14 DIAGNOSIS — E860 Dehydration: Secondary | ICD-10-CM | POA: Diagnosis not present

## 2017-02-14 DIAGNOSIS — R531 Weakness: Secondary | ICD-10-CM | POA: Diagnosis not present

## 2017-02-14 DIAGNOSIS — R109 Unspecified abdominal pain: Secondary | ICD-10-CM | POA: Diagnosis not present

## 2017-02-14 DIAGNOSIS — K51011 Ulcerative (chronic) pancolitis with rectal bleeding: Secondary | ICD-10-CM | POA: Diagnosis not present

## 2017-02-14 DIAGNOSIS — W06XXXA Fall from bed, initial encounter: Secondary | ICD-10-CM | POA: Insufficient documentation

## 2017-02-14 DIAGNOSIS — B9689 Other specified bacterial agents as the cause of diseases classified elsewhere: Secondary | ICD-10-CM | POA: Diagnosis not present

## 2017-02-14 DIAGNOSIS — R2681 Unsteadiness on feet: Secondary | ICD-10-CM | POA: Diagnosis not present

## 2017-02-14 DIAGNOSIS — Z951 Presence of aortocoronary bypass graft: Secondary | ICD-10-CM | POA: Insufficient documentation

## 2017-02-14 DIAGNOSIS — K219 Gastro-esophageal reflux disease without esophagitis: Secondary | ICD-10-CM | POA: Diagnosis not present

## 2017-02-14 DIAGNOSIS — D649 Anemia, unspecified: Secondary | ICD-10-CM | POA: Diagnosis not present

## 2017-02-14 DIAGNOSIS — M25561 Pain in right knee: Secondary | ICD-10-CM | POA: Insufficient documentation

## 2017-02-14 DIAGNOSIS — M1991 Primary osteoarthritis, unspecified site: Secondary | ICD-10-CM | POA: Diagnosis not present

## 2017-02-14 DIAGNOSIS — I10 Essential (primary) hypertension: Secondary | ICD-10-CM | POA: Diagnosis not present

## 2017-02-14 DIAGNOSIS — R0789 Other chest pain: Secondary | ICD-10-CM | POA: Diagnosis not present

## 2017-02-14 DIAGNOSIS — M6281 Muscle weakness (generalized): Secondary | ICD-10-CM | POA: Diagnosis not present

## 2017-02-14 DIAGNOSIS — G4700 Insomnia, unspecified: Secondary | ICD-10-CM | POA: Diagnosis not present

## 2017-02-14 DIAGNOSIS — Z853 Personal history of malignant neoplasm of breast: Secondary | ICD-10-CM | POA: Diagnosis not present

## 2017-02-14 DIAGNOSIS — E785 Hyperlipidemia, unspecified: Secondary | ICD-10-CM | POA: Diagnosis not present

## 2017-02-14 DIAGNOSIS — Z7982 Long term (current) use of aspirin: Secondary | ICD-10-CM | POA: Diagnosis not present

## 2017-02-14 DIAGNOSIS — I2510 Atherosclerotic heart disease of native coronary artery without angina pectoris: Secondary | ICD-10-CM | POA: Diagnosis not present

## 2017-02-14 DIAGNOSIS — N3001 Acute cystitis with hematuria: Secondary | ICD-10-CM | POA: Insufficient documentation

## 2017-02-14 DIAGNOSIS — R079 Chest pain, unspecified: Secondary | ICD-10-CM | POA: Diagnosis not present

## 2017-02-14 DIAGNOSIS — W19XXXA Unspecified fall, initial encounter: Secondary | ICD-10-CM

## 2017-02-14 LAB — BASIC METABOLIC PANEL
ANION GAP: 11 (ref 5–15)
BUN: 25 mg/dL — ABNORMAL HIGH (ref 6–20)
CALCIUM: 8.9 mg/dL (ref 8.9–10.3)
CO2: 23 mmol/L (ref 22–32)
CREATININE: 1.19 mg/dL — AB (ref 0.44–1.00)
Chloride: 100 mmol/L — ABNORMAL LOW (ref 101–111)
GFR, EST AFRICAN AMERICAN: 46 mL/min — AB (ref >60)
GFR, EST NON AFRICAN AMERICAN: 39 mL/min — AB (ref >60)
Glucose, Bld: 121 mg/dL — ABNORMAL HIGH (ref 65–99)
Potassium: 4.4 mmol/L (ref 3.5–5.1)
SODIUM: 134 mmol/L — AB (ref 135–145)

## 2017-02-14 LAB — URINALYSIS, COMPLETE (UACMP) WITH MICROSCOPIC
Bilirubin Urine: NEGATIVE
GLUCOSE, UA: NEGATIVE mg/dL
HGB URINE DIPSTICK: NEGATIVE
Ketones, ur: 5 mg/dL — AB
NITRITE: POSITIVE — AB
PROTEIN: 30 mg/dL — AB
SPECIFIC GRAVITY, URINE: 1.021 (ref 1.005–1.030)
pH: 5 (ref 5.0–8.0)

## 2017-02-14 LAB — CBC
HCT: 33.8 % — ABNORMAL LOW (ref 35.0–47.0)
HEMOGLOBIN: 11.6 g/dL — AB (ref 12.0–16.0)
MCH: 30.9 pg (ref 26.0–34.0)
MCHC: 34.4 g/dL (ref 32.0–36.0)
MCV: 89.8 fL (ref 80.0–100.0)
PLATELETS: 371 10*3/uL (ref 150–440)
RBC: 3.76 MIL/uL — AB (ref 3.80–5.20)
RDW: 14.3 % (ref 11.5–14.5)
WBC: 9.5 10*3/uL (ref 3.6–11.0)

## 2017-02-14 MED ORDER — MORPHINE SULFATE (PF) 2 MG/ML IV SOLN: 2.0000 mg | Freq: Once | INTRAVENOUS | Status: DC

## 2017-02-14 MED ORDER — SODIUM CHLORIDE 0.9 % IV BOLUS (SEPSIS)
1000.0000 mL | Freq: Once | INTRAVENOUS | Status: AC
  Administered 2017-02-14: 1000 mL via INTRAVENOUS

## 2017-02-14 MED ORDER — KETOROLAC TROMETHAMINE 30 MG/ML IJ SOLN
15.0000 mg | Freq: Once | INTRAMUSCULAR | Status: AC
  Administered 2017-02-14: 15 mg via INTRAVENOUS

## 2017-02-14 MED ORDER — CEPHALEXIN 500 MG PO CAPS: 500.0000 mg | ORAL_CAPSULE | Freq: Three times a day (TID) | ORAL | 0 refills | Status: DC

## 2017-02-14 MED ORDER — IOPAMIDOL (ISOVUE-300) INJECTION 61%
75.0000 mL | Freq: Once | INTRAVENOUS | Status: AC | PRN
  Administered 2017-02-14: 75 mL via INTRAVENOUS

## 2017-02-14 MED ORDER — LABETALOL HCL 5 MG/ML IV SOLN
10.0000 mg | Freq: Once | INTRAVENOUS | Status: AC
  Administered 2017-02-14: 10 mg via INTRAVENOUS
  Filled 2017-02-14: qty 4

## 2017-02-14 MED ORDER — ACETAMINOPHEN 500 MG PO TABS
1000.0000 mg | ORAL_TABLET | Freq: Once | ORAL | Status: AC
  Administered 2017-02-14: 1000 mg via ORAL
  Filled 2017-02-14: qty 2

## 2017-02-14 MED ORDER — CEFTRIAXONE SODIUM IN DEXTROSE 20 MG/ML IV SOLN
1.0000 g | Freq: Once | INTRAVENOUS | Status: AC
  Administered 2017-02-14: 1 g via INTRAVENOUS
  Filled 2017-02-14: qty 50

## 2017-02-14 MED ORDER — MORPHINE SULFATE (PF) 2 MG/ML IV SOLN
2.0000 mg | Freq: Once | INTRAVENOUS | Status: AC
  Administered 2017-02-14: 2 mg via INTRAVENOUS
  Filled 2017-02-14: qty 1

## 2017-02-14 MED ORDER — KETOROLAC TROMETHAMINE 30 MG/ML IJ SOLN
INTRAMUSCULAR | Status: AC
  Administered 2017-02-14: 15 mg via INTRAVENOUS
  Filled 2017-02-14: qty 1

## 2017-02-14 NOTE — Discharge Instructions (Signed)

## 2017-02-14 NOTE — Telephone Encounter (Signed)
Pt daughter called about needing to schedule appt for pt to have her staples removed. Daughter states that they was supposed to have been removed within 5-7 days.  If pt waited til next Thursday would that be ok? Please advise?  Call @ [phone]. Thank you!

## 2017-02-14 NOTE — Telephone Encounter (Signed)
Please advise 

## 2017-02-14 NOTE — ED Notes (Signed)
This RN ambulated patient to toilet

## 2017-02-14 NOTE — ED Triage Notes (Signed)
Pt sent to ED from [facility] for recurrent falls. Pt was seen in ED Monday for a fall and was discharged with a compression fracture and laceration to her head. PT seen today after two falls last night and a fall this morning. Pt reports left arm is sore, left flank pain and increased weakness this week. Pt is alert and oriented and denies having hit head again today. Pt is not on blood thinners. Pt denies cough, congestions, abd pain, changes in urination or fevers at home this week. Pt lives at home alone.

## 2017-02-14 NOTE — ED Triage Notes (Signed)
Patient has fallen 2 time last PM and 1 time this AM. The patient complains of back pain and weakness. Patient was seen in ED 2 days ago for head laceration due to fall.

## 2017-02-14 NOTE — Discharge Instructions (Signed)
Recommend patient go to ED for further evaluation and management °

## 2017-02-14 NOTE — Telephone Encounter (Signed)
[place] from [employer] called and stated that pt pt had 3 falls yesterday and one over the weekend that resulted in an er visit with staples to the head. Blood pressure is ok when sitting but when standing pt is dizzy. Pt is also c/o pain in back and sides.   Call @ [phone]

## 2017-02-14 NOTE — ED Notes (Signed)
Pt states she fell out of bed. Pt was seen here on Monday for fall and was d/c. Pt complains of pain in the Left arm and flank pain. FPt denies cough congestion abd pain.Pt is not on blood thinner

## 2017-02-14 NOTE — Telephone Encounter (Signed)
We do not in our office. [facility] has several openings that day.

## 2017-02-14 NOTE — ED Provider Notes (Signed)
-----------------------------------------   9:50 PM on 02/14/2017 -----------------------------------------  I took over care of this patient over Dr. Alfred Levins.  The plan at the time of sign out was to await for the patient to get the rest of her fluids, and then discharge home with antibiotics for UTI. Patient has had several falls out of bed. Patient now reports severe pain to her left flank and left lower rib area. The rib series was negative, but the patient does have tenderness posteriorly as well as into the flank and lateral part of the abdomen. I suspect this either could be soft tissue contusion, pain from pyelonephritis, or possibly intra-abdominal injury from the falls. I will obtain a CT abdomen to rule out intra-abdominal injuries, and then reassess pt's clinical status and dispo plan.   ----------------------------------------- 11:25 PM on 02/14/2017 -----------------------------------------  CT is negative for acute intra-abdominal injury.  Patient reports pain is somewhat improved. However on my reassessment the patient's blood pressure was 200/115.  patient has no chest pain, difficulty breathing, elevated creatinine, or other symptoms of hypertensive crisis, and I suspect that this is most likely due to her pain. I'll give additional analgesic and a dose of labetalol and then reassess. Anticipate patient will still likely go to go home if blood pressure improved.   Arta Silence, MD 02/14/17 2328

## 2017-02-14 NOTE — Telephone Encounter (Signed)
Called l/m with [facility] to call office. I also called patient and after checking dpr spoke with [facility]. States patent had fall on Monday and was taken to ED by EMS with injury to head. (see other message about staple removal from head). She started becoming dizzy/weak last night and fell several times getting out of the bed during the night. Daughter could not tell me if she hit her head or specific information about fall she was alone at the time of fall. She had been c/o of dizziness/weekness off and on all day. She has new complaint of left side pian but could not give me a pain level or location that started after falls this am. She is not having any n&v, headache, sob, changes in vision. She has no history of blood thinners. I have advised daughter that it is recommended that she be evaluated today at ED that further images and evaluation way be needed. She has agreed and will take patient to ED to be seen today.

## 2017-02-14 NOTE — Telephone Encounter (Signed)
Does anyone have an acute spot to remove staples - 7 days after ER visit?

## 2017-02-14 NOTE — ED Provider Notes (Signed)
[facility]    CSN: [id number] Arrival date & time: 02/14/17  1543     History   Chief Complaint Chief Complaint  Patient presents with  . Fall    HPI Angela Howe is a 83 y.o. female.   83 yo female with a h/o multiple medical problems, including GI 2 months ago, and a recent fall 2 days ago with scalp laceration and thoracic vertebral fracture, presents with complaint of recurrent falls since last night. States that she fell at home twice last night and again this morning. Not sure where she hit herself but complains of pain to the left side of her abdomen. Denies any vomiting, fevers, chills, numbness/tingling, vision changes, headache.   Patient is accompanied by daughter who states that patient lives alone but gets help during the day from a visiting nurse/assistant.     The history is provided by the patient.  Fall     Past Medical History:  Diagnosis Date  . Breast cancer ([place])   . CAD (coronary artery disease)   . Degenerative disc disease   . GERD (gastroesophageal reflux disease)   . Hyperlipidemia    Previous intolerance of statins  . Hypothyroidism   . Orthostatic hypotension   . Osteoarthritis   . Peptic ulcer disease   . Syncope     Patient Active Problem List   Diagnosis Date Noted  . Pancolitis ([place]) 12/21/2016  . Unsteady gait 08/13/2016  . Falls frequently 07/04/2016  . Chest pain 10/12/2015  . Rib fracture 10/03/2015  . Right carotid bruit 08/01/2015  . Loss of weight 01/12/2015  . Anemia 07/18/2014  . Health care maintenance 07/18/2014  . Hoarseness 07/18/2014  . Abdominal pain 05/05/2014  . Back pain 10/05/2013  . Knee pain 07/14/2013  . Oral pain 05/06/2013  . Itching 01/05/2013  . History of breast cancer 11/20/2012  . Syncope 11/20/2012  . Hypothyroidism 08/10/2012  . Difficulty sleeping 08/10/2012  . Dizziness 11/08/2010  . Hyperlipemia 03/23/2009  . HYPERTENSION, BENIGN 03/23/2009  . CAD, NATIVE VESSEL  09/24/2008  . ORTHOSTATIC HYPOTENSION 09/24/2008  . ABNORMAL EKG 09/24/2008    Past Surgical History:  Procedure Laterality Date  . ABDOMINAL HYSTERECTOMY  1970  . APPENDECTOMY    . BIOPSY BREAST  2014  . BREAST EXCISIONAL BIOPSY Left 2014   neg  . BREAST EXCISIONAL BIOPSY Left 2006   postive radation  . BREAST LUMPECTOMY  2006  . CHOLECYSTECTOMY    . CORONARY ARTERY BYPASS GRAFT  05/2006   x3  . NASAL SINUS SURGERY    . ROTATOR CUFF REPAIR      OB History    No data available       Home Medications    Prior to Admission medications   Medication Sig Start Date End Date Taking? Authorizing Provider  amitriptyline (ELAVIL) 50 MG tablet TAKE 1 TABLET BY MOUTH AT BEDTIME 12/08/16  Yes Einar Pheasant, MD  aspirin EC 81 MG tablet Take 81 mg by mouth daily.   Yes [provider]  atorvastatin (LIPITOR) 20 MG tablet Take 1 tablet (20 mg total) by mouth daily. 08/08/16  Yes Einar Pheasant, MD  citalopram (CELEXA) 20 MG tablet Take 1 tablet (20 mg total) by mouth daily. 08/08/16  Yes Einar Pheasant, MD  fentaNYL (DURAGESIC - DOSED MCG/HR) 12 MCG/HR Place 1 patch (12.5 mcg total) onto the skin every 3 (three) days. 10/13/15  Yes Wieting, Richard, MD  lansoprazole (PREVACID) 30 MG capsule Take 30  mg by mouth daily.     Yes [provider]  levothyroxine (SYNTHROID, LEVOTHROID) 50 MCG tablet Take 1 tablet (50 mcg total) by mouth daily. 08/08/16  Yes Einar Pheasant, MD  fludrocortisone (FLORINEF) 0.1 MG tablet Take 1 tablet (0.1 mg total) by mouth daily. 03/22/15   Einar Pheasant, MD  nitroGLYCERIN (NITROSTAT) 0.4 MG SL tablet Place 1 tablet (0.4 mg total) under the tongue every 5 (five) minutes as needed for chest pain. 07/10/14   Einar Pheasant, MD    Family History Family History  Problem Relation Age of Onset  . Heart attack Sister   . Cancer Brother   . Pancreatic cancer Brother   . Ovarian cancer Sister     Social History Social History  Substance Use  Topics  . Smoking status: Never Smoker  . Smokeless tobacco: Never Used  . Alcohol use No     Allergies   Alendronate sodium and Prednisone   Review of Systems Review of Systems   Physical Exam Triage Vital Signs ED Triage Vitals  Enc Vitals Group     BP 02/14/17 1601 (!) 129/52     Pulse Rate 02/14/17 1601 (!) 111     Resp --      Temp 02/14/17 1601 98.1 F (36.7 C)     Temp Source 02/14/17 1601 Oral     SpO2 02/14/17 1601 99 %     Weight --      Height --      Head Circumference --      Peak Flow --      Pain Score 02/14/17 1605 10     Pain Loc --      Pain Edu? --      Excl. in [place]? --    No data found.   Updated Vital Signs BP (!) 129/52 (BP Location: Left Arm)   Pulse (!) 111   Temp 98.1 F (36.7 C) (Oral)   SpO2 99%   Visual Acuity Right Eye Distance:   Left Eye Distance:   Bilateral Distance:    Right Eye Near:   Left Eye Near:    Bilateral Near:     Physical Exam  Constitutional:  Non-toxic appearance. She has a sickly appearance. No distress.  Thin and frail appearing  HENT:  Head: Normocephalic.  Scalp laceration with staples in place healing well  Eyes: Pupils are equal, round, and reactive to light.  Cardiovascular: Normal heart sounds.  Tachycardia present.   Pulmonary/Chest: Effort normal and breath sounds normal. No respiratory distress. She has no wheezes.  Abdominal: Soft. Bowel sounds are normal. She exhibits no distension. There is tenderness (left flank).  Skin: She is not diaphoretic.  Nursing note and vitals reviewed.    UC Treatments / Results  Labs (all labs ordered are listed, but only abnormal results are displayed) Labs Reviewed - No data to display  EKG  EKG Interpretation None       Radiology No results found.  Procedures Procedures (including critical care time)  Medications Ordered in UC Medications - No data to display   Initial Impression / Assessment and Plan / UC Course  I have reviewed the  triage vital signs and the nursing notes.  Pertinent labs & imaging results that were available during my care of the patient were reviewed by me and considered in my medical decision making (see chart for details).       Final Clinical Impressions(s) / UC Diagnoses   Final diagnoses:  Recurrent falls  Left flank pain  Weakness  Closed compression fracture of thoracic vertebra with routine healing, subsequent encounter    New Prescriptions Discharge Medication List as of 02/14/2017  4:31 PM      1. diagnosis reviewed with patient and daughter. Due to patient's recent recurrent falls, social situation and new symptom of abdominal pain with fall last night, recommend patient go to ED for further evaluation/management and possible admission to the hospital. Daughter will transport patient to the ED by private vehicle. Report called to triage RN at [facility] ED.    Controlled Substance Prescriptions [place] Controlled Substance Registry consulted? Not Applicable   Norval Gable, MD 02/14/17 [phone]

## 2017-02-14 NOTE — ED Notes (Signed)
IVF scanned but not started by previous shift.  Placed on pump @ 985mL/hr

## 2017-02-14 NOTE — ED Provider Notes (Signed)
[facility] Emergency Department Provider Note  ____________________________________________  Time seen: Approximately 6:16 PM  I have reviewed the triage vital signs and the nursing notes.   HISTORY  Chief Complaint Fall   HPI Angela Howe is a 83 y.o. female with a history of the CAD, hyperlipidemia, hypothyroidism and frequent falls who presents for evaluation after several falls.patient was seen here 2 days ago status post mechanical fall for which she received staples in her scalp. This past night patient rolled out of bed 3 times falling on the ground. Patient's daughter is concerned because patient had diarrhea over the weekend and she thinks that patient is dehydrated. All her falls were rolling out of bed the middle of the night. Her bed is not high off the ground and the floor is carpeted. Patient is complaining of pain in her left lower rib cage area and her right knee which has been present since this morning, mild, constant, both worse with palpation. No chest pain or shortness of breath. Patient along or having diarrhea. No vomiting, no abdominal pain, no dysuria, no fever, no chills.  Past Medical History:  Diagnosis Date  . Breast cancer ([place])   . CAD (coronary artery disease)   . Degenerative disc disease   . GERD (gastroesophageal reflux disease)   . Hyperlipidemia    Previous intolerance of statins  . Hypothyroidism   . Orthostatic hypotension   . Osteoarthritis   . Peptic ulcer disease   . Syncope     Patient Active Problem List   Diagnosis Date Noted  . Pancolitis ([place]) 12/21/2016  . Unsteady gait 08/13/2016  . Falls frequently 07/04/2016  . Chest pain 10/12/2015  . Rib fracture 10/03/2015  . Right carotid bruit 08/01/2015  . Loss of weight 01/12/2015  . Anemia 07/18/2014  . Health care maintenance 07/18/2014  . Hoarseness 07/18/2014  . Abdominal pain 05/05/2014  . Back pain 10/05/2013  . Knee pain 07/14/2013  .  Oral pain 05/06/2013  . Itching 01/05/2013  . History of breast cancer 11/20/2012  . Syncope 11/20/2012  . Hypothyroidism 08/10/2012  . Difficulty sleeping 08/10/2012  . Dizziness 11/08/2010  . Hyperlipemia 03/23/2009  . HYPERTENSION, BENIGN 03/23/2009  . CAD, NATIVE VESSEL 09/24/2008  . ORTHOSTATIC HYPOTENSION 09/24/2008  . ABNORMAL EKG 09/24/2008    Past Surgical History:  Procedure Laterality Date  . ABDOMINAL HYSTERECTOMY  1970  . APPENDECTOMY    . BIOPSY BREAST  2014  . BREAST EXCISIONAL BIOPSY Left 2014   neg  . BREAST EXCISIONAL BIOPSY Left 2006   postive radation  . BREAST LUMPECTOMY  2006  . CHOLECYSTECTOMY    . CORONARY ARTERY BYPASS GRAFT  05/2006   x3  . NASAL SINUS SURGERY    . ROTATOR CUFF REPAIR      Prior to Admission medications   Medication Sig Start Date End Date Taking? Authorizing Provider  amitriptyline (ELAVIL) 50 MG tablet TAKE 1 TABLET BY MOUTH AT BEDTIME 12/08/16   Einar Pheasant, MD  aspirin EC 81 MG tablet Take 81 mg by mouth daily.    [provider]  atorvastatin (LIPITOR) 20 MG tablet Take 1 tablet (20 mg total) by mouth daily. 08/08/16   Einar Pheasant, MD  cephALEXin (KEFLEX) 500 MG capsule Take 1 capsule (500 mg total) by mouth 3 (three) times daily. 02/14/17 02/21/17  Rudene Re, MD  citalopram (CELEXA) 20 MG tablet Take 1 tablet (20 mg total) by mouth daily. 08/08/16   Einar Pheasant,  MD  fentaNYL (DURAGESIC - DOSED MCG/HR) 12 MCG/HR Place 1 patch (12.5 mcg total) onto the skin every 3 (three) days. 10/13/15   Loletha Grayer, MD  fludrocortisone (FLORINEF) 0.1 MG tablet Take 1 tablet (0.1 mg total) by mouth daily. 03/22/15   Einar Pheasant, MD  lansoprazole (PREVACID) 30 MG capsule Take 30 mg by mouth daily.      [provider]  levothyroxine (SYNTHROID, LEVOTHROID) 50 MCG tablet Take 1 tablet (50 mcg total) by mouth daily. 08/08/16   Einar Pheasant, MD  nitroGLYCERIN (NITROSTAT) 0.4 MG SL tablet Place 1 tablet  (0.4 mg total) under the tongue every 5 (five) minutes as needed for chest pain. 07/10/14   Einar Pheasant, MD    Allergies Alendronate sodium and Prednisone  Family History  Problem Relation Age of Onset  . Heart attack Sister   . Cancer Brother   . Pancreatic cancer Brother   . Ovarian cancer Sister     Social History Social History  Substance Use Topics  . Smoking status: Never Smoker  . Smokeless tobacco: Never Used  . Alcohol use No    Review of Systems Constitutional: Negative for fever. Eyes: Negative for visual changes. ENT: Negative for facial injury or neck injury Cardiovascular: Negative for chest injury. Respiratory: Negative for shortness of breath.  Gastrointestinal: Negative for abdominal pain or injury. Genitourinary: Negative for dysuria. Musculoskeletal: Negative for back injury. + L chest wall pain and R knee pain. Skin: Negative for laceration/abrasions. Neurological: Negative for head injury.  ___________________________________________   PHYSICAL EXAM:  VITAL SIGNS: ED Triage Vitals  Enc Vitals Group     BP 02/14/17 1702 (!) 175/89     Pulse Rate 02/14/17 1702 98     Resp 02/14/17 1702 16     Temp 02/14/17 1702 98.3 F (36.8 C)     Temp Source 02/14/17 1702 Oral     SpO2 02/14/17 1702 100 %     Weight 02/14/17 1704 104 lb (47.2 kg)     Height 02/14/17 1704 5\' 4"  (1.626 m)     Head Circumference --      Peak Flow --      Pain Score --      Pain Loc --      Pain Edu? --      Excl. in [place]? --    Constitutional: Alert and oriented. No acute distress. Does not appear intoxicated. HEENT Head: Normocephalic and atraumatic. Face: No facial bony tenderness. Stable midface Ears: No hemotympanum bilaterally. No Battle sign Eyes: No eye injury. PERRL. No raccoon eyes Nose: Nontender. No epistaxis. No rhinorrhea Mouth/Throat: Mucous membranes are moist. No oropharyngeal blood. No dental injury. Airway patent without stridor. Normal  voice. Neck: no C-collar in place. No midline c-spine tenderness.  Cardiovascular: Normal rate, regular rhythm. Normal and symmetric distal pulses are present in all extremities. Pulmonary/Chest: Chest wall is stable, ttp over the Left Lower chest wall with no bruises or deformities. Normal respiratory effort. Breath sounds are normal. No crepitus.  Abdominal: Soft, nontender, non distended. Musculoskeletal: Nontender with normal full range of motion in all extremities. No deformities. No thoracic or lumbar midline spinal tenderness. Pelvis is stable. Skin: Skin is warm, dry and intact. B/l knee abrasions Psychiatric: Speech and behavior are appropriate. Neurological: Normal speech and language. Moves all extremities to command. No gross focal neurologic deficits are appreciated.  Glascow Coma Score: 4 - Opens eyes on own 6 - Follows simple motor commands 5 - Alert and oriented  GCS: 15   ____________________________________________   LABS (all labs ordered are listed, but only abnormal results are displayed)  Labs Reviewed  BASIC METABOLIC PANEL - Abnormal; Notable for the following:       Result Value   Sodium 134 (*)    Chloride 100 (*)    Glucose, Bld 121 (*)    BUN 25 (*)    Creatinine, Ser 1.19 (*)    GFR calc non Af Amer 39 (*)    GFR calc Af Amer 46 (*)    All other components within normal limits  CBC - Abnormal; Notable for the following:    RBC 3.76 (*)    Hemoglobin 11.6 (*)    HCT 33.8 (*)    All other components within normal limits  URINALYSIS, COMPLETE (UACMP) WITH MICROSCOPIC - Abnormal; Notable for the following:    Color, Urine YELLOW (*)    APPearance CLOUDY (*)    Ketones, ur 5 (*)    Protein, ur 30 (*)    Nitrite POSITIVE (*)    Leukocytes, UA MODERATE (*)    Bacteria, UA RARE (*)    Squamous Epithelial / LPF 0-5 (*)    Non Squamous Epithelial 0-5 (*)    All other components within normal limits  URINE CULTURE  CBG MONITORING, ED    ____________________________________________  EKG  ED ECG REPORT I, Rudene Re, the attending physician, personally viewed and interpreted this ECG.  Normal sinus rhythm with occasional PACs, rate of 100, normal intervals, normal axis, no ST elevations or depressions.no significant changes when compared to prior from March 2018 ____________________________________________  RADIOLOGY  CXR with rib view: negative  XR R knee: 1. Mild tricompartmental joint space narrowing of the right knee. 2. No acute fracture nor joint dislocation. No joint effusion ____________________________________________   PROCEDURES  Procedure(s) performed: None Procedures Critical Care performed:  None ____________________________________________   INITIAL IMPRESSION / ASSESSMENT AND PLAN / ED COURSE  83 y.o. female with a history of the CAD, hyperlipidemia, hypothyroidism and frequent falls who presents for evaluation after rolling out of her bed in the middle of the night 3 times. Family worried that patient is dehydrated due to recent diarrheal illness which has resolved. Patient is complaining of chest wall pain and is tender to palpation on the left lower chest wall area with no bruises or deformities. X-ray has been ordered. She is also complaining of pain in her right knee for which an x-ray has been ordered. Do not believe patient warrants CT of the head and neck since she is completely neurologically intact, has no headache or neck pain and rolled from a low bed onto a carpeted floor. I will check basic blood work to make sure patient is not dehydrated, has normal electrolytes or has no evidence of an infection with a urinalysis.    _________________________ 6:23 PM on 02/14/2017 -----------------------------------------  labs concerning for mild dehydration with a creatinine of 1.19 (baseline is 0.9-1). Will give IVF. Electrolytes WNL. Stable hgb. UA concerning for UTI will give  rocephin,. Plan to dc after abx. Care transferred to Dr. Cherylann Banas   Clinical Course as of Feb 15 2300  Wed Feb 14, 2017  2018 Per Dr. Alfred Levins: reassess after fluids, d/c home if comfortable.  Will d/c with abx.   [SS]  2338 Signed out to Dr. Beather Arbour pending repeat BP after labetalol.  If BP improving, likely d/c home.   [SS]    Clinical Course User Index [SS] Arta Silence,  MD    As part of my medical decision making, I reviewed the following data within the [place] History obtained from family, Nursing notes reviewed and incorporated, Labs reviewed , EKG interpreted , Old EKG reviewed, Old chart reviewed, Patient signed out to Dr. Cherylann Banas, Radiograph reviewed , Notes from prior ED visits and  Controlled Substance Database    Pertinent labs & imaging results that were available during my care of the patient were reviewed by me and considered in my medical decision making (see chart for details).    ____________________________________________   FINAL CLINICAL IMPRESSION(S) / ED DIAGNOSES  Final diagnoses:  Fall, initial encounter  Dehydration  Acute cystitis with hematuria      NEW MEDICATIONS STARTED DURING THIS VISIT:  Discharge Medication List as of 02/15/2017 12:15 AM    START taking these medications   Details  cephALEXin (KEFLEX) 500 MG capsule Take 1 capsule (500 mg total) by mouth 3 (three) times daily., Starting Wed 02/14/2017, Until Wed 02/21/2017, Print         Note:  This document was prepared using Dragon voice recognition software and may include unintentional dictation errors.    Rudene Re, MD 02/15/17 [phone]

## 2017-02-14 NOTE — ED Notes (Signed)
Pt c/o continued left side pain, informed MD and they will re-evaluate for possible CT

## 2017-02-14 NOTE — ED Notes (Signed)
Resumed care from dave rn   Med given.  Family with pt.  siderails up x 2

## 2017-02-15 NOTE — Telephone Encounter (Signed)
Placed on the schedule, thanks

## 2017-02-15 NOTE — Telephone Encounter (Signed)
Agree with ER evaluation

## 2017-02-15 NOTE — Telephone Encounter (Signed)
I can see her at 12:00 on Thursday 02/22/17.  She may need to be seen prior to this appt to have staples removed.  I do not have anything earlier since she could not come in on Tuesday.

## 2017-02-15 NOTE — Telephone Encounter (Signed)
Please schedule patient for 10/25 at 12pm for follow up per Dr. Nicki Reaper. Patients daughter is aware of date and time.

## 2017-02-15 NOTE — Telephone Encounter (Signed)
I have a 10:00 spot open on Tuesday 02/20/17

## 2017-02-15 NOTE — ED Provider Notes (Signed)
-----------------------------------------   12:14 AM on 02/15/2017 -----------------------------------------  Assume care of patient and asked to recheck blood pressure after labetalol. Patient currently resting comfortably; BP 161/65. Patient and daughter are eager for discharge. Strict return precautions given. Both verbalize understanding and agree with plan of care.   Paulette Blanch, MD 02/15/17 [phone]

## 2017-02-15 NOTE — Telephone Encounter (Signed)
Patients daughter says she can not come that day because she has another appointment at the same time at another office. Also, patient was taken to ER again last night for another fall. She is now being treated for a UTI. Is there another day that you could work her in?

## 2017-02-17 LAB — URINE CULTURE

## 2017-02-20 ENCOUNTER — Encounter: Payer: Self-pay | Admitting: Emergency Medicine

## 2017-02-20 ENCOUNTER — Emergency Department: Payer: Medicare Other

## 2017-02-20 ENCOUNTER — Inpatient Hospital Stay
Admission: EM | Admit: 2017-02-20 | Discharge: 2017-02-23 | DRG: 641 | Disposition: A | Discharge status: Skilled Nursing Facility | Payer: Medicare Other | Attending: Internal Medicine | Admitting: Internal Medicine

## 2017-02-20 DIAGNOSIS — Z888 Allergy status to other drugs, medicaments and biological substances status: Secondary | ICD-10-CM | POA: Diagnosis not present

## 2017-02-20 DIAGNOSIS — R410 Disorientation, unspecified: Secondary | ICD-10-CM

## 2017-02-20 DIAGNOSIS — N390 Urinary tract infection, site not specified: Secondary | ICD-10-CM | POA: Diagnosis not present

## 2017-02-20 DIAGNOSIS — M549 Dorsalgia, unspecified: Secondary | ICD-10-CM | POA: Diagnosis present

## 2017-02-20 DIAGNOSIS — Z7189 Other specified counseling: Secondary | ICD-10-CM | POA: Diagnosis not present

## 2017-02-20 DIAGNOSIS — K219 Gastro-esophageal reflux disease without esophagitis: Secondary | ICD-10-CM | POA: Diagnosis present

## 2017-02-20 DIAGNOSIS — E559 Vitamin D deficiency, unspecified: Secondary | ICD-10-CM | POA: Diagnosis not present

## 2017-02-20 DIAGNOSIS — E785 Hyperlipidemia, unspecified: Secondary | ICD-10-CM | POA: Diagnosis not present

## 2017-02-20 DIAGNOSIS — S299XXA Unspecified injury of thorax, initial encounter: Secondary | ICD-10-CM | POA: Diagnosis not present

## 2017-02-20 DIAGNOSIS — K279 Peptic ulcer, site unspecified, unspecified as acute or chronic, without hemorrhage or perforation: Secondary | ICD-10-CM | POA: Diagnosis not present

## 2017-02-20 DIAGNOSIS — R799 Abnormal finding of blood chemistry, unspecified: Secondary | ICD-10-CM

## 2017-02-20 DIAGNOSIS — M4850XA Collapsed vertebra, not elsewhere classified, site unspecified, initial encounter for fracture: Secondary | ICD-10-CM | POA: Diagnosis not present

## 2017-02-20 DIAGNOSIS — Z515 Encounter for palliative care: Secondary | ICD-10-CM

## 2017-02-20 DIAGNOSIS — F0390 Unspecified dementia without behavioral disturbance: Secondary | ICD-10-CM | POA: Diagnosis present

## 2017-02-20 DIAGNOSIS — M6281 Muscle weakness (generalized): Secondary | ICD-10-CM | POA: Diagnosis not present

## 2017-02-20 DIAGNOSIS — Z951 Presence of aortocoronary bypass graft: Secondary | ICD-10-CM | POA: Diagnosis not present

## 2017-02-20 DIAGNOSIS — I951 Orthostatic hypotension: Secondary | ICD-10-CM | POA: Diagnosis not present

## 2017-02-20 DIAGNOSIS — Z66 Do not resuscitate: Secondary | ICD-10-CM | POA: Diagnosis not present

## 2017-02-20 DIAGNOSIS — E860 Dehydration: Secondary | ICD-10-CM | POA: Diagnosis not present

## 2017-02-20 DIAGNOSIS — I2510 Atherosclerotic heart disease of native coronary artery without angina pectoris: Secondary | ICD-10-CM | POA: Diagnosis not present

## 2017-02-20 DIAGNOSIS — F329 Major depressive disorder, single episode, unspecified: Secondary | ICD-10-CM | POA: Diagnosis present

## 2017-02-20 DIAGNOSIS — M1990 Unspecified osteoarthritis, unspecified site: Secondary | ICD-10-CM | POA: Diagnosis present

## 2017-02-20 DIAGNOSIS — R079 Chest pain, unspecified: Secondary | ICD-10-CM | POA: Diagnosis not present

## 2017-02-20 DIAGNOSIS — E039 Hypothyroidism, unspecified: Secondary | ICD-10-CM | POA: Diagnosis present

## 2017-02-20 DIAGNOSIS — G8929 Other chronic pain: Secondary | ICD-10-CM | POA: Diagnosis not present

## 2017-02-20 DIAGNOSIS — Z7982 Long term (current) use of aspirin: Secondary | ICD-10-CM

## 2017-02-20 DIAGNOSIS — D638 Anemia in other chronic diseases classified elsewhere: Secondary | ICD-10-CM | POA: Diagnosis not present

## 2017-02-20 DIAGNOSIS — D649 Anemia, unspecified: Secondary | ICD-10-CM | POA: Diagnosis not present

## 2017-02-20 DIAGNOSIS — J189 Pneumonia, unspecified organism: Secondary | ICD-10-CM | POA: Diagnosis not present

## 2017-02-20 DIAGNOSIS — Z853 Personal history of malignant neoplasm of breast: Secondary | ICD-10-CM

## 2017-02-20 DIAGNOSIS — S2241XD Multiple fractures of ribs, right side, subsequent encounter for fracture with routine healing: Secondary | ICD-10-CM | POA: Diagnosis not present

## 2017-02-20 DIAGNOSIS — R296 Repeated falls: Secondary | ICD-10-CM | POA: Diagnosis present

## 2017-02-20 DIAGNOSIS — R824 Acetonuria: Secondary | ICD-10-CM | POA: Diagnosis not present

## 2017-02-20 DIAGNOSIS — S0101XD Laceration without foreign body of scalp, subsequent encounter: Secondary | ICD-10-CM

## 2017-02-20 DIAGNOSIS — R7989 Other specified abnormal findings of blood chemistry: Secondary | ICD-10-CM | POA: Diagnosis not present

## 2017-02-20 DIAGNOSIS — I10 Essential (primary) hypertension: Secondary | ICD-10-CM | POA: Diagnosis present

## 2017-02-20 DIAGNOSIS — Z7401 Bed confinement status: Secondary | ICD-10-CM | POA: Diagnosis not present

## 2017-02-20 DIAGNOSIS — F05 Delirium due to known physiological condition: Secondary | ICD-10-CM

## 2017-02-20 DIAGNOSIS — K5730 Diverticulosis of large intestine without perforation or abscess without bleeding: Secondary | ICD-10-CM | POA: Diagnosis not present

## 2017-02-20 DIAGNOSIS — R627 Adult failure to thrive: Secondary | ICD-10-CM | POA: Diagnosis present

## 2017-02-20 DIAGNOSIS — I700 Atherosclerosis of aorta: Secondary | ICD-10-CM | POA: Diagnosis not present

## 2017-02-20 DIAGNOSIS — R109 Unspecified abdominal pain: Secondary | ICD-10-CM | POA: Diagnosis not present

## 2017-02-20 DIAGNOSIS — Z79899 Other long term (current) drug therapy: Secondary | ICD-10-CM

## 2017-02-20 DIAGNOSIS — Z9181 History of falling: Secondary | ICD-10-CM | POA: Diagnosis not present

## 2017-02-20 DIAGNOSIS — W19XXXD Unspecified fall, subsequent encounter: Secondary | ICD-10-CM | POA: Diagnosis not present

## 2017-02-20 LAB — URINALYSIS, ROUTINE W REFLEX MICROSCOPIC
BILIRUBIN URINE: NEGATIVE
GLUCOSE, UA: NEGATIVE mg/dL
HGB URINE DIPSTICK: NEGATIVE
KETONES UR: 5 mg/dL — AB
Leukocytes, UA: NEGATIVE
NITRITE: NEGATIVE
PH: 5 (ref 5.0–8.0)
Protein, ur: NEGATIVE mg/dL
SPECIFIC GRAVITY, URINE: 1.029 (ref 1.005–1.030)

## 2017-02-20 LAB — CBC WITH DIFFERENTIAL/PLATELET
BASOS ABS: 0 10*3/uL (ref 0–0.1)
Basophils Relative: 0 %
EOS ABS: 0 10*3/uL (ref 0–0.7)
EOS PCT: 0 %
HCT: 27.4 % — ABNORMAL LOW (ref 35.0–47.0)
Hemoglobin: 9 g/dL — ABNORMAL LOW (ref 12.0–16.0)
Lymphocytes Relative: 9 %
Lymphs Abs: 1.4 10*3/uL (ref 1.0–3.6)
MCH: 29.7 pg (ref 26.0–34.0)
MCHC: 32.9 g/dL (ref 32.0–36.0)
MCV: 90.5 fL (ref 80.0–100.0)
MONO ABS: 0.5 10*3/uL (ref 0.2–0.9)
Monocytes Relative: 3 %
Neutro Abs: 13.4 10*3/uL — ABNORMAL HIGH (ref 1.4–6.5)
Neutrophils Relative %: 88 %
PLATELETS: 450 10*3/uL — AB (ref 150–440)
RBC: 3.03 MIL/uL — AB (ref 3.80–5.20)
RDW: 14.6 % — AB (ref 11.5–14.5)
WBC: 15.3 10*3/uL — AB (ref 3.6–11.0)

## 2017-02-20 LAB — BASIC METABOLIC PANEL
ANION GAP: 10 (ref 5–15)
BUN: 58 mg/dL — AB (ref 6–20)
CALCIUM: 8.8 mg/dL — AB (ref 8.9–10.3)
CO2: 21 mmol/L — ABNORMAL LOW (ref 22–32)
Chloride: 109 mmol/L (ref 101–111)
Creatinine, Ser: 0.92 mg/dL (ref 0.44–1.00)
GFR calc Af Amer: >60 mL/min (ref >60)
GFR, EST NON AFRICAN AMERICAN: 54 mL/min — AB (ref >60)
Glucose, Bld: 116 mg/dL — ABNORMAL HIGH (ref 65–99)
POTASSIUM: 4.5 mmol/L (ref 3.5–5.1)
SODIUM: 140 mmol/L (ref 135–145)

## 2017-02-20 LAB — TROPONIN I

## 2017-02-20 LAB — MAGNESIUM: Magnesium: 2.2 mg/dL (ref 1.7–2.4)

## 2017-02-20 MED ORDER — ASPIRIN EC 81 MG PO TBEC
81.0000 mg | DELAYED_RELEASE_TABLET | Freq: Every day | ORAL | Status: DC
  Administered 2017-02-20 – 2017-02-23 (×4): 81 mg via ORAL
  Filled 2017-02-20 (×4): qty 1

## 2017-02-20 MED ORDER — SODIUM CHLORIDE 0.9 % IV SOLN
Freq: Once | INTRAVENOUS | Status: AC
  Administered 2017-02-20: 13:00:00 via INTRAVENOUS

## 2017-02-20 MED ORDER — AMITRIPTYLINE HCL 25 MG PO TABS
50.0000 mg | ORAL_TABLET | Freq: Every day | ORAL | Status: DC
  Administered 2017-02-20: 21:00:00 50 mg via ORAL
  Filled 2017-02-20: qty 2

## 2017-02-20 MED ORDER — KETOROLAC TROMETHAMINE 15 MG/ML IJ SOLN
15.0000 mg | Freq: Four times a day (QID) | INTRAMUSCULAR | Status: DC | PRN
  Administered 2017-02-20 – 2017-02-22 (×3): 15 mg via INTRAVENOUS
  Filled 2017-02-20 (×4): qty 1

## 2017-02-20 MED ORDER — ACETAMINOPHEN 325 MG PO TABS
650.0000 mg | ORAL_TABLET | Freq: Four times a day (QID) | ORAL | Status: DC | PRN
  Administered 2017-02-20: 16:00:00 650 mg via ORAL
  Filled 2017-02-20: qty 2

## 2017-02-20 MED ORDER — ACETAMINOPHEN 650 MG RE SUPP: 650.0000 mg | Freq: Four times a day (QID) | RECTAL | Status: DC | PRN

## 2017-02-20 MED ORDER — ENOXAPARIN SODIUM 30 MG/0.3ML ~~LOC~~ SOLN: 30.0000 mg | SUBCUTANEOUS | Status: DC

## 2017-02-20 MED ORDER — DRONABINOL 2.5 MG PO CAPS
2.5000 mg | ORAL_CAPSULE | Freq: Two times a day (BID) | ORAL | Status: DC
  Administered 2017-02-20 – 2017-02-23 (×5): 2.5 mg via ORAL
  Filled 2017-02-20 (×5): qty 1

## 2017-02-20 MED ORDER — PANTOPRAZOLE SODIUM 20 MG PO TBEC
20.0000 mg | DELAYED_RELEASE_TABLET | Freq: Every day | ORAL | Status: DC
  Filled 2017-02-20: qty 1

## 2017-02-20 MED ORDER — NITROGLYCERIN 0.4 MG SL SUBL: 0.4000 mg | SUBLINGUAL_TABLET | SUBLINGUAL | Status: DC | PRN

## 2017-02-20 MED ORDER — CITALOPRAM HYDROBROMIDE 20 MG PO TABS
20.0000 mg | ORAL_TABLET | Freq: Every day | ORAL | Status: DC
  Administered 2017-02-20 – 2017-02-23 (×4): 20 mg via ORAL
  Filled 2017-02-20 (×4): qty 1

## 2017-02-20 MED ORDER — FENTANYL 12 MCG/HR TD PT72
12.5000 ug | MEDICATED_PATCH | TRANSDERMAL | Status: DC
  Administered 2017-02-20 – 2017-02-23 (×2): 12.5 ug via TRANSDERMAL
  Filled 2017-02-20 (×2): qty 1

## 2017-02-20 MED ORDER — LEVOTHYROXINE SODIUM 50 MCG PO TABS
50.0000 ug | ORAL_TABLET | Freq: Every day | ORAL | Status: DC
  Administered 2017-02-21 – 2017-02-23 (×3): 50 ug via ORAL
  Filled 2017-02-20 (×3): qty 1

## 2017-02-20 MED ORDER — ATORVASTATIN CALCIUM 20 MG PO TABS
20.0000 mg | ORAL_TABLET | Freq: Every day | ORAL | Status: DC
  Administered 2017-02-20 – 2017-02-23 (×4): 20 mg via ORAL
  Filled 2017-02-20 (×4): qty 1

## 2017-02-20 MED ORDER — CEPHALEXIN 500 MG PO CAPS
500.0000 mg | ORAL_CAPSULE | Freq: Two times a day (BID) | ORAL | Status: AC
  Administered 2017-02-20 – 2017-02-21 (×3): 500 mg via ORAL
  Filled 2017-02-20 (×3): qty 1

## 2017-02-20 MED ORDER — ONDANSETRON HCL 4 MG/2ML IJ SOLN: 4.0000 mg | Freq: Four times a day (QID) | INTRAMUSCULAR | Status: DC | PRN

## 2017-02-20 MED ORDER — ONDANSETRON HCL 4 MG PO TABS: 4.0000 mg | ORAL_TABLET | Freq: Four times a day (QID) | ORAL | Status: DC | PRN

## 2017-02-20 MED ORDER — PANTOPRAZOLE SODIUM 40 MG PO TBEC
40.0000 mg | DELAYED_RELEASE_TABLET | Freq: Every day | ORAL | Status: DC
  Administered 2017-02-20 – 2017-02-23 (×4): 40 mg via ORAL
  Filled 2017-02-20 (×4): qty 1

## 2017-02-20 MED ORDER — SODIUM CHLORIDE 0.9 % IV BOLUS (SEPSIS): 500.0000 mL | INTRAVENOUS | Status: DC

## 2017-02-20 MED ORDER — SODIUM CHLORIDE 0.9 % IV SOLN
INTRAVENOUS | Status: DC
  Administered 2017-02-20: 16:00:00 via INTRAVENOUS

## 2017-02-20 MED ORDER — FLUDROCORTISONE ACETATE 0.1 MG PO TABS
0.1000 mg | ORAL_TABLET | Freq: Every day | ORAL | Status: DC
  Administered 2017-02-20 – 2017-02-23 (×4): 0.1 mg via ORAL
  Filled 2017-02-20 (×4): qty 1

## 2017-02-20 NOTE — ED Notes (Signed)
Daughter reports very poor po intake; a few bites a day and enough water to take medications. She states that pt will not get up from bed or the couch due to the pain. States that pt rarely goes to the bathroom. Last BM 2 days ago. Daughter requests that pt be admitted, as her pain is not controlled.

## 2017-02-20 NOTE — ED Provider Notes (Signed)
[facility] Emergency Department Provider Note  ____________________________________________   First MD Initiated Contact with Patient 02/20/17 1118     (approximate)  I have reviewed the triage vital signs and the nursing notes.   HISTORY  Chief Complaint Weakness    HPI Angela Howe is a 83 y.o. female who lives at home and is brought by EMS today for evaluation of generalized weakness.  this is her fifth emergency department or urgent care visit in 6 months.  She was treated last week for UTI and a week before that was seen for a fall.  Paramedics state that the family is concerned that the patient continues to be weak.  She is also pale but alert and oriented.  She has a recent vertebral compression fracture that is resulting in some chronic low back pain and she hurts more in some positions than others.she denies shortness of breath and chest pain and abdominal pain.  Just denies fever and chills.  She has reportedly been eating less than usual recently. reportedly her weakness is severe, gradual in onset, and nothing is making it better nor worse   Past Medical History:  Diagnosis Date  . Breast cancer ([place])   . CAD (coronary artery disease)   . Degenerative disc disease   . GERD (gastroesophageal reflux disease)   . Hyperlipidemia    Previous intolerance of statins  . Hypothyroidism   . Orthostatic hypotension   . Osteoarthritis   . Peptic ulcer disease   . Syncope     Patient Active Problem List   Diagnosis Date Noted  . Pancolitis ([place]) 12/21/2016  . Unsteady gait 08/13/2016  . Falls frequently 07/04/2016  . Chest pain 10/12/2015  . Rib fracture 10/03/2015  . Right carotid bruit 08/01/2015  . Loss of weight 01/12/2015  . Anemia 07/18/2014  . Health care maintenance 07/18/2014  . Hoarseness 07/18/2014  . Abdominal pain 05/05/2014  . Back pain 10/05/2013  . Knee pain 07/14/2013  . Oral pain 05/06/2013  . Itching 01/05/2013    . History of breast cancer 11/20/2012  . Syncope 11/20/2012  . Hypothyroidism 08/10/2012  . Difficulty sleeping 08/10/2012  . Dizziness 11/08/2010  . Hyperlipemia 03/23/2009  . HYPERTENSION, BENIGN 03/23/2009  . CAD, NATIVE VESSEL 09/24/2008  . ORTHOSTATIC HYPOTENSION 09/24/2008  . ABNORMAL EKG 09/24/2008    Past Surgical History:  Procedure Laterality Date  . ABDOMINAL HYSTERECTOMY  1970  . APPENDECTOMY    . BIOPSY BREAST  2014  . BREAST EXCISIONAL BIOPSY Left 2014   neg  . BREAST EXCISIONAL BIOPSY Left 2006   postive radation  . BREAST LUMPECTOMY  2006  . CHOLECYSTECTOMY    . CORONARY ARTERY BYPASS GRAFT  05/2006   x3  . NASAL SINUS SURGERY    . ROTATOR CUFF REPAIR      Prior to Admission medications   Medication Sig Start Date End Date Taking? Authorizing Provider  aspirin EC 81 MG tablet Take 81 mg by mouth daily.   Yes [provider]  fentaNYL (DURAGESIC - DOSED MCG/HR) 12 MCG/HR Place 1 patch (12.5 mcg total) onto the skin every 3 (three) days. 10/13/15  Yes Wieting, Richard, MD  lansoprazole (PREVACID) 30 MG capsule Take 30 mg by mouth daily.     Yes [provider]  amitriptyline (ELAVIL) 50 MG tablet TAKE 1 TABLET BY MOUTH AT BEDTIME 12/08/16   Einar Pheasant, MD  atorvastatin (LIPITOR) 20 MG tablet Take 1 tablet (20 mg total) by  mouth daily. 08/08/16   Einar Pheasant, MD  cephALEXin (KEFLEX) 500 MG capsule Take 1 capsule (500 mg total) by mouth 3 (three) times daily. 02/14/17 02/21/17  Rudene Re, MD  citalopram (CELEXA) 20 MG tablet Take 1 tablet (20 mg total) by mouth daily. 08/08/16   Einar Pheasant, MD  fludrocortisone (FLORINEF) 0.1 MG tablet Take 1 tablet (0.1 mg total) by mouth daily. 03/22/15   Einar Pheasant, MD  levothyroxine (SYNTHROID, LEVOTHROID) 50 MCG tablet Take 1 tablet (50 mcg total) by mouth daily. 08/08/16   Einar Pheasant, MD  nitroGLYCERIN (NITROSTAT) 0.4 MG SL tablet Place 1 tablet (0.4 mg total) under the tongue  every 5 (five) minutes as needed for chest pain. 07/10/14   Einar Pheasant, MD    Allergies Alendronate sodium and Prednisone  Family History  Problem Relation Age of Onset  . Heart attack Sister   . Cancer Brother   . Pancreatic cancer Brother   . Ovarian cancer Sister     Social History Social History  Substance Use Topics  . Smoking status: Never Smoker  . Smokeless tobacco: Never Used  . Alcohol use No    Review of Systems Constitutional: No fever/chills.  Generalized weakness/fatigue.  Decreased PO intake Eyes: No visual changes. Cardiovascular: Denies chest pain. Respiratory: Denies shortness of breath. Gastrointestinal: No abdominal pain.  No nausea, no vomiting.  No diarrhea.  No constipation. Genitourinary: Negative for dysuria. Musculoskeletal: Negative for neck pain.  Low back pain s/p recent compression fracture Integumentary: Negative for rash. Neurological: Negative for headaches, focal weakness or numbness, but patient presents for evaluation of severe generalized weakness   ____________________________________________   PHYSICAL EXAM:  VITAL SIGNS: ED Triage Vitals  Enc Vitals Group     BP 02/20/17 1121 124/70     Pulse Rate 02/20/17 1121 96     Resp --      Temp 02/20/17 1121 99.1 F (37.3 C)     Temp Source 02/20/17 1121 Oral     SpO2 02/20/17 1121 98 %     Weight 02/20/17 1123 46.7 kg (103 lb)     Height 02/20/17 1123 1.626 m (5\' 4" )     Head Circumference --      Peak Flow --      Pain Score 02/20/17 1120 10     Pain Loc --      Pain Edu? --      Excl. in [place]? --     Constitutional: Alert and oriented. Elderly, chronic malnutrition,  Eyes: Conjunctivae are normal.  Head: Atraumatic except for well-healed scalp wound on the left posterior scalp with 5 staples which I removed as documented below Nose: No congestion/rhinnorhea. Mouth/Throat: Mucous membranes are moist. Neck: No stridor.  No meningeal signs.  No cervical spine tenderness  to palpation. Cardiovascular: borderline tachycardia, regular rhythm. Good peripheral circulation. Grossly normal heart sounds. Respiratory: Normal respiratory effort.  No retractions. Lungs CTAB. Gastrointestinal: Soft and nontender. No distention.  Musculoskeletal: No lower extremity tenderness nor edema. No gross deformities of extremities. tenderness in the lower back consistent with recent compression fracture Neurologic:  Normal speech and language. No gross focal neurologic deficits are appreciated.  Skin:  Skin is warm, dry and intact. No rash noted. Psychiatric: Mood and affect are normal. Speech and behavior are normal.  ____________________________________________   LABS (all labs ordered are listed, but only abnormal results are displayed)  Labs Reviewed  CBC WITH DIFFERENTIAL/PLATELET - Abnormal; Notable for the following:  Result Value   WBC 15.3 (*)    RBC 3.03 (*)    Hemoglobin 9.0 (*)    HCT 27.4 (*)    RDW 14.6 (*)    Platelets 450 (*)    Neutro Abs 13.4 (*)    All other components within normal limits  BASIC METABOLIC PANEL - Abnormal; Notable for the following:    CO2 21 (*)    Glucose, Bld 116 (*)    BUN 58 (*)    Calcium 8.8 (*)    GFR calc non Af Amer 54 (*)    All other components within normal limits  URINALYSIS, ROUTINE W REFLEX MICROSCOPIC - Abnormal; Notable for the following:    Color, Urine YELLOW (*)    APPearance CLEAR (*)    Ketones, ur 5 (*)    All other components within normal limits  URINE CULTURE  TROPONIN I  MAGNESIUM   ____________________________________________  EKG  ED ECG REPORT I, Kamare Caspers, the attending physician, personally viewed and interpreted this ECG.  Date: 02/20/2017 EKG Time: 11:27 AM Rate: 101 Rhythm: borderline sinus tachycardia QRS Axis: RAD Intervals: QTc = 563ms ST/T Wave abnormalities: Non-specific ST segment / T-wave changes, but no evidence of acute ischemia. Narrative Interpretation: no  evidence of acute ischemia   ____________________________________________  RADIOLOGY   Dg Chest Portable 1 View  Result Date: 02/20/2017 CLINICAL DATA:  83 year old female with weakness. Recent fall. Subsequent encounter. EXAM: PORTABLE CHEST 1 VIEW COMPARISON:  02/14/2017. FINDINGS: No infiltrate, congestive heart failure or pneumothorax. Skin fold on the left. Post median sternotomy/ CABG.  Heart size within normal limits. Calcified slightly tortuous aorta. Prior right shoulder surgery. CT detected T11 compression fracture not well delineated by plain film exam. IMPRESSION: No acute pulmonary abnormality. Aortic Atherosclerosis (ICD10-I70.0). Electronically Signed   By: Genia Del M.D.   On: 02/20/2017 12:12    ____________________________________________   PROCEDURES  Critical Care performed: No   Procedure(s) performed:   .Suture Removal Date/Time: 02/20/2017 1:01 PM Performed by: Hinda Kehr Authorized by: Hinda Kehr   Consent:    Consent obtained:  Verbal   Consent given by:  Patient and guardian Location:    Location:  [place] location:  [place] Procedure details:    Wound appearance:  No signs of infection, good wound healing and clean   Number of staples removed:  5 Post-procedure details:    Patient tolerance of procedure:  Tolerated well, no immediate complications     ____________________________________________   INITIAL IMPRESSION / ASSESSMENT AND PLAN / ED COURSE  As part of my medical decision making, I reviewed the following data within the [place] History obtained from family, Nursing notes reviewed and incorporated, Labs reviewed , EKG interpreted , Old chart reviewed and Notes from prior ED visits    differential diagnosis includes acute infection, failure to thrive in the setting of geriatric age and recent illnesses and injuries, chronic malignancy, ACS, etc.  However I suspect debilitation and possibly  persistent urinary tract infection are most likely.  The patient does look slightly dehydrated.  I will evaluate the patient broadly and look for indications or criteria for inpatient treatment versus placement in a skilled nursing facility.  I had an extended conversation with the patient's daughter/caregiver about the plan and she understands and agrees.  Clinical Course as of Feb 20 1313  Tue Feb 20, 2017  1258 lab work is notable for 5 ketones in her urine and a BUN  of 58.  Her creatinine of 0.92 likely reflects the fact that she has very little muscle mass and I think that the combination of a BUN of 58, ketones in her urine, and a sustained heart rate of 100 or greater is also strongly suggestive of dehydration.  I spoke with Malachy Mood the case manager and she agreed that this qualifies the patient for inpatient status.  I have spoken in person with Dr. Tor Netters the hospitalist service who will admit.  I will treat the dehydration with 100 mL per hour of normal saline infusion for gentle rehydration until the hospitalist has an inpatient plan.  [CF]    Clinical Course User Index [CF] Hinda Kehr, MD    ____________________________________________  FINAL CLINICAL IMPRESSION(S) / ED DIAGNOSES  Final diagnoses:  Dehydration  Failure to thrive in adult  Elevated BUN  Ketonuria     MEDICATIONS GIVEN DURING THIS VISIT:  Medications  0.9 %  sodium chloride infusion (not administered)     NEW OUTPATIENT MEDICATIONS STARTED DURING THIS VISIT:  New Prescriptions   No medications on file    Modified Medications   No medications on file    Discontinued Medications   No medications on file     Note:  This document was prepared using Dragon voice recognition software and may include unintentional dictation errors.    Hinda Kehr, MD 02/20/17 1314

## 2017-02-20 NOTE — Progress Notes (Signed)
Dr. Verdell Carmine notified pt back pain 10/10. Pt states Ordered Tylenol does not help alone.

## 2017-02-20 NOTE — H&P (Signed)
[place] [facility] [place] NAME: Angela Howe    MR#:  [id number]  DATE OF BIRTH:  [birth year]  DATE OF ADMISSION:  02/20/2017  PRIMARY CARE PHYSICIAN: Einar Pheasant, MD   REQUESTING/REFERRING PHYSICIAN: Dr. Hinda Kehr  CHIEF COMPLAINT:   Chief Complaint  Patient presents with  . Weakness    HISTORY OF PRESENT ILLNESS:  Angela Howe  is a 83 y.o. female with a known history of breast cancer, coronary artery disease, GERD, degenerative disc disease, hypothyroidism, orthostatic hypotension, previous history of syncope, adult failure to thrive who presents to the hospital due to generalized weakness, frequent falls and poor p.o. intake.  Patient has been to the ER 2 or 3 times this past week due to recurrent falls.  She had a laceration to the back of her head which was stable and the staples were removed today.  She also was noted to have a urinary tract infection last week for which she is currently on oral Keflex.  As per the daughter who has been staying with the patient has had poor p.o. intake over the past week and has become significantly weak and has not able to get around at home.  She brought her to the ER for further evaluation.  Patient was noted to be dehydrated secondary to adult failure to thrive and poor p.o. intake and therefore hospital services were contacted for treatment evaluation.  Patient presently is complaining of chronic back pain.  She denies any dysphagia, odynophagia.  She denies any abdominal pain, nausea, vomiting, fever, chills, cough, congestion or any other associated symptoms presently.  PAST MEDICAL HISTORY:   Past Medical History:  Diagnosis Date  . Breast cancer ([place])   . CAD (coronary artery disease)   . Degenerative disc disease   . GERD (gastroesophageal reflux disease)   . Hyperlipidemia    Previous intolerance of statins  . Hypothyroidism   . Orthostatic hypotension   . Osteoarthritis   . Peptic ulcer  disease   . Syncope     PAST SURGICAL HISTORY:   Past Surgical History:  Procedure Laterality Date  . ABDOMINAL HYSTERECTOMY  1970  . APPENDECTOMY    . BIOPSY BREAST  2014  . BREAST EXCISIONAL BIOPSY Left 2014   neg  . BREAST EXCISIONAL BIOPSY Left 2006   postive radation  . BREAST LUMPECTOMY  2006  . CHOLECYSTECTOMY    . CORONARY ARTERY BYPASS GRAFT  05/2006   x3  . NASAL SINUS SURGERY    . ROTATOR CUFF REPAIR      SOCIAL HISTORY:   Social History  Substance Use Topics  . Smoking status: Never Smoker  . Smokeless tobacco: Never Used  . Alcohol use No    FAMILY HISTORY:   Family History  Problem Relation Age of Onset  . Heart attack Sister   . Cancer Brother   . Pancreatic cancer Brother   . Ovarian cancer Sister     DRUG ALLERGIES:   Allergies  Allergen Reactions  . Alendronate Sodium Other (See Comments)    Reaction:  Restlessness   . Prednisone Other (See Comments)    Reaction:  Restlessness     REVIEW OF SYSTEMS:   Review of Systems  Constitutional: Negative for fever and weight loss.  HENT: Negative for congestion, nosebleeds and tinnitus.   Eyes: Negative for blurred vision, double vision and redness.  Respiratory: Negative for cough, hemoptysis and shortness of breath.   Cardiovascular: Negative for chest  pain, orthopnea, leg swelling and PND.  Gastrointestinal: Negative for abdominal pain, diarrhea, melena, nausea and vomiting.  Genitourinary: Negative for dysuria, hematuria and urgency.  Musculoskeletal: Positive for back pain and falls. Negative for joint pain.  Neurological: Positive for weakness. Negative for dizziness, tingling, sensory change, focal weakness, seizures and headaches.  Endo/Heme/Allergies: Negative for polydipsia. Does not bruise/bleed easily.  Psychiatric/Behavioral: Negative for depression and memory loss. The patient is not nervous/anxious.     MEDICATIONS AT HOME:   Prior to Admission medications   Medication Sig  Start Date End Date Taking? Authorizing Provider  aspirin EC 81 MG tablet Take 81 mg by mouth daily.   Yes [provider]  fentaNYL (DURAGESIC - DOSED MCG/HR) 12 MCG/HR Place 1 patch (12.5 mcg total) onto the skin every 3 (three) days. 10/13/15  Yes Wieting, Richard, MD  lansoprazole (PREVACID) 30 MG capsule Take 30 mg by mouth daily.     Yes [provider]  amitriptyline (ELAVIL) 50 MG tablet TAKE 1 TABLET BY MOUTH AT BEDTIME 12/08/16   Einar Pheasant, MD  atorvastatin (LIPITOR) 20 MG tablet Take 1 tablet (20 mg total) by mouth daily. 08/08/16   Einar Pheasant, MD  cephALEXin (KEFLEX) 500 MG capsule Take 1 capsule (500 mg total) by mouth 3 (three) times daily. 02/14/17 02/21/17  Rudene Re, MD  citalopram (CELEXA) 20 MG tablet Take 1 tablet (20 mg total) by mouth daily. 08/08/16   Einar Pheasant, MD  fludrocortisone (FLORINEF) 0.1 MG tablet Take 1 tablet (0.1 mg total) by mouth daily. 03/22/15   Einar Pheasant, MD  levothyroxine (SYNTHROID, LEVOTHROID) 50 MCG tablet Take 1 tablet (50 mcg total) by mouth daily. 08/08/16   Einar Pheasant, MD  nitroGLYCERIN (NITROSTAT) 0.4 MG SL tablet Place 1 tablet (0.4 mg total) under the tongue every 5 (five) minutes as needed for chest pain. 07/10/14   Einar Pheasant, MD      VITAL SIGNS:  Blood pressure 124/70, pulse 96, temperature 99.1 F (37.3 C), temperature source Oral, height 5\' 4"  (1.626 m), weight 46.7 kg (103 lb), SpO2 98 %.  PHYSICAL EXAMINATION:  Physical Exam  GENERAL:  83 y.o.-year-old patient lying in the bed in no acute distress.  EYES: Pupils equal, round, reactive to light and accommodation. No scleral icterus. Extraocular muscles intact.  HEENT: Head atraumatic, normocephalic. Oropharynx and nasopharynx clear. No oropharyngeal erythema, moist oral mucosa  NECK:  Supple, no jugular venous distention. No thyroid enlargement, no tenderness.  LUNGS: Normal breath sounds bilaterally, no wheezing, rales, rhonchi.  No use of accessory muscles of respiration.  CARDIOVASCULAR: S1, S2 RRR. No murmurs, rubs, gallops, clicks.  ABDOMEN: Soft, nontender, nondistended. Bowel sounds present. No organomegaly or mass.  EXTREMITIES: No pedal edema, cyanosis, or clubbing. + 2 pedal & radial pulses b/l.   NEUROLOGIC: Cranial nerves II through XII are intact. No focal Motor or sensory deficits appreciated b/l. Globally weak.  PSYCHIATRIC: The patient is alert and oriented x 2.  SKIN: No obvious rash, lesion, or ulcer.   LABORATORY PANEL:   CBC  Recent Labs Lab 02/20/17 1147  WBC 15.3*  HGB 9.0*  HCT 27.4*  PLT 450*   ------------------------------------------------------------------------------------------------------------------  Chemistries   Recent Labs Lab 02/20/17 1147  NA 140  K 4.5  CL 109  CO2 21*  GLUCOSE 116*  BUN 58*  CREATININE 0.92  CALCIUM 8.8*  MG 2.2   ------------------------------------------------------------------------------------------------------------------  Cardiac Enzymes  Recent Labs Lab 02/20/17 1147  TROPONINI <0.03   ------------------------------------------------------------------------------------------------------------------  RADIOLOGY:  Dg Chest Portable 1 View  Result Date: 02/20/2017 CLINICAL DATA:  83 year old female with weakness. Recent fall. Subsequent encounter. EXAM: PORTABLE CHEST 1 VIEW COMPARISON:  02/14/2017. FINDINGS: No infiltrate, congestive heart failure or pneumothorax. Skin fold on the left. Post median sternotomy/ CABG.  Heart size within normal limits. Calcified slightly tortuous aorta. Prior right shoulder surgery. CT detected T11 compression fracture not well delineated by plain film exam. IMPRESSION: No acute pulmonary abnormality. Aortic Atherosclerosis (ICD10-I70.0). Electronically Signed   By: Genia Del M.D.   On: 02/20/2017 12:12     IMPRESSION AND PLAN:   83 year old female with past medical history of hypertension,  hyperlipidemia, orthostatic hypotension, GERD, coronary artery disease, breast cancer, previous history of syncope who presents to the hospital due to weakness, frequent falls and poor p.o. intake.  1.  Dehydration-this is secondary to patient's poor p.o. intake. -Patient does have an elevated BUN but the creatinine is normal.  Patient actually has no muscle mass lesions or cachectic. -We will place her on IV fluids and follow clinically.  2.  Adult failure to thrive/poor p.o. intake- I will place the patient on some Marinol, will get palliative care consult to discuss goals of care.  3.  Generalized weakness/frequent falls-secondary to #1 and 2.  We will get a physical therapy consult to assess mobility.  4.  Recent urinary tract infection-continue Keflex.  5.  Hypothyroidism-continue Synthroid.  Next  6.  History of orthostatic hypotension- continue Florinef.  7.  Depression-continue Celexa, Elavil.  8.  Chronic back pain-secondary to her recurrent falls.  Continue fentanyl patch.  9.  GERD-continue Protonix.    All the records are reviewed and case discussed with ED provider. Management plans discussed with the patient, family and they are in agreement.  CODE STATUS: DNR  TOTAL TIME TAKING CARE OF THIS PATIENT: 45 minutes.    Henreitta Leber M.D on 02/20/2017 at 1:56 PM  Between 7am to 6pm - Pager - [phone]  After 6pm go to [email or web address] - password [email or web address] [place] [facility]  Office  [phone]  CC: Primary care physician; Einar Pheasant, MD

## 2017-02-20 NOTE — ED Triage Notes (Signed)
Pt via ems from home with generalized weakness. Pt was treated last week for UTI and for a fall 2 weeks ago; family states that weakness continues. Pt is pale, alert, oriented. She wants to be in side-lying position and is difficult to move to her back for triage/assessment. PT c/o pain to her lower back 10/10, as she has compression fx from fall 2 weeks ago.

## 2017-02-21 ENCOUNTER — Inpatient Hospital Stay: Payer: Medicare Other

## 2017-02-21 DIAGNOSIS — D649 Anemia, unspecified: Secondary | ICD-10-CM | POA: Diagnosis not present

## 2017-02-21 DIAGNOSIS — N390 Urinary tract infection, site not specified: Secondary | ICD-10-CM | POA: Diagnosis not present

## 2017-02-21 DIAGNOSIS — F05 Delirium due to known physiological condition: Secondary | ICD-10-CM

## 2017-02-21 DIAGNOSIS — Z515 Encounter for palliative care: Secondary | ICD-10-CM

## 2017-02-21 DIAGNOSIS — R627 Adult failure to thrive: Secondary | ICD-10-CM | POA: Diagnosis not present

## 2017-02-21 DIAGNOSIS — E860 Dehydration: Secondary | ICD-10-CM | POA: Diagnosis not present

## 2017-02-21 DIAGNOSIS — Z7189 Other specified counseling: Secondary | ICD-10-CM

## 2017-02-21 DIAGNOSIS — R410 Disorientation, unspecified: Secondary | ICD-10-CM

## 2017-02-21 LAB — VITAMIN B12: VITAMIN B 12: 654 pg/mL (ref 180–914)

## 2017-02-21 LAB — CBC
HCT: 20.9 % — ABNORMAL LOW (ref 35.0–47.0)
HEMOGLOBIN: 7 g/dL — AB (ref 12.0–16.0)
MCH: 30.4 pg (ref 26.0–34.0)
MCHC: 33.4 g/dL (ref 32.0–36.0)
MCV: 91.1 fL (ref 80.0–100.0)
PLATELETS: 334 10*3/uL (ref 150–440)
RBC: 2.29 MIL/uL — AB (ref 3.80–5.20)
RDW: 14.5 % (ref 11.5–14.5)
WBC: 13.1 10*3/uL — AB (ref 3.6–11.0)

## 2017-02-21 LAB — URINE CULTURE
Culture: NO GROWTH
SPECIAL REQUESTS: NORMAL

## 2017-02-21 LAB — BASIC METABOLIC PANEL
Anion gap: 6 (ref 5–15)
BUN: 54 mg/dL — ABNORMAL HIGH (ref 6–20)
CALCIUM: 7.9 mg/dL — AB (ref 8.9–10.3)
CO2: 20 mmol/L — AB (ref 22–32)
CREATININE: 1.08 mg/dL — AB (ref 0.44–1.00)
Chloride: 116 mmol/L — ABNORMAL HIGH (ref 101–111)
GFR, EST AFRICAN AMERICAN: 51 mL/min — AB (ref >60)
GFR, EST NON AFRICAN AMERICAN: 44 mL/min — AB (ref >60)
Glucose, Bld: 89 mg/dL (ref 65–99)
Potassium: 4 mmol/L (ref 3.5–5.1)
SODIUM: 142 mmol/L (ref 135–145)

## 2017-02-21 LAB — IRON AND TIBC
IRON: 37 ug/dL (ref 28–170)
Saturation Ratios: 18 % (ref 10.4–31.8)
TIBC: 205 ug/dL — ABNORMAL LOW (ref 250–450)
UIBC: 168 ug/dL

## 2017-02-21 LAB — PREPARE RBC (CROSSMATCH)

## 2017-02-21 LAB — RETICULOCYTES
RBC.: 2.26 MIL/uL — ABNORMAL LOW (ref 3.80–5.20)
Retic Count, Absolute: 67.8 10*3/uL (ref 19.0–183.0)
Retic Ct Pct: 3 % (ref 0.4–3.1)

## 2017-02-21 LAB — FOLATE: FOLATE: 24 ng/mL (ref >5.9)

## 2017-02-21 LAB — ABO/RH: ABO/RH(D): A POS

## 2017-02-21 LAB — FERRITIN: Ferritin: 83 ng/mL (ref 11–307)

## 2017-02-21 MED ORDER — LIDOCAINE 5 % EX PTCH
2.0000 | MEDICATED_PATCH | CUTANEOUS | Status: DC
  Administered 2017-02-21 – 2017-02-23 (×3): 2 via TRANSDERMAL
  Filled 2017-02-21 (×3): qty 2

## 2017-02-21 MED ORDER — ACETAMINOPHEN 325 MG PO TABS
650.0000 mg | ORAL_TABLET | Freq: Three times a day (TID) | ORAL | Status: DC
  Administered 2017-02-21 – 2017-02-23 (×6): 650 mg via ORAL
  Filled 2017-02-21 (×6): qty 2

## 2017-02-21 MED ORDER — IOPAMIDOL (ISOVUE-300) INJECTION 61%
60.0000 mL | Freq: Once | INTRAVENOUS | Status: AC | PRN
  Administered 2017-02-21: 12:00:00 60 mL via INTRAVENOUS

## 2017-02-21 MED ORDER — SODIUM CHLORIDE 0.9 % IV SOLN
Freq: Once | INTRAVENOUS | Status: AC
  Administered 2017-02-21: 14:00:00 via INTRAVENOUS

## 2017-02-21 MED ORDER — ADULT MULTIVITAMIN W/MINERALS CH
1.0000 | ORAL_TABLET | Freq: Every day | ORAL | Status: DC
  Administered 2017-02-22 – 2017-02-23 (×2): 1 via ORAL
  Filled 2017-02-21 (×2): qty 1

## 2017-02-21 MED ORDER — DOCUSATE SODIUM 50 MG/5ML PO LIQD
50.0000 mg | Freq: Two times a day (BID) | ORAL | Status: DC
  Administered 2017-02-21 – 2017-02-23 (×3): 50 mg via ORAL
  Filled 2017-02-21 (×6): qty 10

## 2017-02-21 MED ORDER — DOCUSATE SODIUM 50 MG PO CAPS
50.0000 mg | ORAL_CAPSULE | Freq: Two times a day (BID) | ORAL | Status: DC
  Filled 2017-02-21: qty 1

## 2017-02-21 MED ORDER — AMITRIPTYLINE HCL 25 MG PO TABS
25.0000 mg | ORAL_TABLET | Freq: Every day | ORAL | Status: DC
  Administered 2017-02-21 – 2017-02-22 (×2): 25 mg via ORAL
  Filled 2017-02-21 (×2): qty 1

## 2017-02-21 NOTE — Progress Notes (Signed)
Initial Nutrition Assessment  DOCUMENTATION CODES:   Severe malnutrition in context of chronic illness, Underweight  INTERVENTION:  Provide Magic cup TID with meals, each supplement provides 290 kcal and 9 grams of protein. Patient prefers chocolate.  Provide multivitamin with minerals daily.  Will monitor outcome of discussions regarding goals of care.  NUTRITION DIAGNOSIS:   Malnutrition (Severe) related to chronic illness (advanced age) as evidenced by severe depletion of body fat, severe depletion of muscle mass.  GOAL:   Patient will meet greater than or equal to 90% of their needs  MONITOR:   PO intake, Supplement acceptance, Labs, Weight trends, I & O's, Skin  REASON FOR ASSESSMENT:   Malnutrition Screening Tool    ASSESSMENT:   83 year old female with PMHx of CAD, orthostatic hypotension, syncope, breast cancer s/p breast lumpectomy in 2006, OA, degenerative disc disease, hypothyroidism, HLD, peptic ulcer disease who presented with generalized weakness, frequent falls, poor PO intake admitted with dehydration, adult FTT.   -Patient was started on Marinol. -PMT consulted to discuss goals of care.  Met with patient at bedside. She was curled up on bed with eyes shut. Spoke with patient's daughter and granddaughter at bedside. They report for the past week patient has only been taking small bites and sips and has gotten dehydrated. She only had some ice cream last night and has not had much today. They report patient typically has 3 meals per day. Breakfast is coffee and a breakfast bar. For lunch she has a sandwich. For dinner she may have chicken or chicken pot pie with some tea. They report she only usually eats 50% of meals. They report patient does not like Boost or Ensure. She does like ice cream, pudding, and yogurt. Family reports patient does not have any difficulties chewing/swallowing.   Family reports patient's UBW is 117 lbs. They report she has lost her  weight over the past 2 years slowly.  Medications reviewed and include: Colace, Marinol 2.5 mg BID, fentanyl patch, levothyroxine, pantoprazole.  Labs reviewed: Chloride 116, CO2 20, BUN 54, Creatinine 1.08.  Nutrition-Focused physical exam completed. Findings are severe fat depletion, severe muscle depletion, and no edema.   Discussed with RN. She is having pain in her back from a compressed fracture.  Diet Order:  Diet regular Room service appropriate? Yes; Fluid consistency: Thin  Skin:  Reviewed, no issues  Last BM:  PTA (02/19/2017 per chart)  Height:   Ht Readings from Last 1 Encounters:  02/20/17 _0  (1.626 m)    Weight:   Wt Readings from Last 1 Encounters:  02/20/17 103 lb (46.7 kg)    Ideal Body Weight:  54.5 kg  BMI:  Body mass index is 17.68 kg/m.  Estimated Nutritional Needs:   Kcal:  1200-1400 (25-30 kcal/kg)  Protein:  65-75 grams (1.4-1.6 grams/kg)  Fluid:  1.2 L/day (25 ml/kg)  EDUCATION NEEDS:   No education needs identified at this time  Willey Blade, [place], [place], [place] Office: [phone] Pager: [phone] After Hours/Weekend Pager: [phone]

## 2017-02-21 NOTE — Progress Notes (Signed)
PT Cancellation Note  Patient Details Name: [name] MRN: [id number] DOB: 1925-11-05   Cancelled Treatment:    Reason Eval/Treat Not Completed: Medical issues which prohibited therapy. Hgb 7, nursing asking PT to wait but asking when therapy seeing her.  Has not started blood yet, so likely will be tomorrow AM.   Ramond Dial 02/21/2017, 2:05 PM   Mee Hives, PT MS Acute Rehab Dept. Number: [place] [facility] [place]

## 2017-02-21 NOTE — Consult Note (Signed)
Consultation Note Date: 02/21/2017   Patient Name: Angela Howe  DOB: 11/30/1925  MRN: [id number]  Age / Sex: 83 y.o., female  PCP: Einar Pheasant, MD Referring Physician: Dustin Flock, MD  Reason for Consultation: Establishing goals of care  HPI/Patient Profile: 83 y.o. female  with past medical history of breast cancer (remote, s/p lumpectomy- recent CT showed calcified mass, felt to be benign), CAD, degenerative disc disease, GERD, orthostatic hypotension, osteoarthritis, peptic ulcer disease, syncope, compression fractures admitted on 02/20/2017 with increasing weakness and decreased po intake. She has had frequent falls at home. Workup revealed dehydration and she is being treated for UTI. She also has severe back pain from compression fractures and degenerative disc disease that have been limiting her mobility recently. Today noted to have decreased Hgb- 7, plans for blood transfusion and CT scan. Palliative medicine consulted for [place].    Clinical Assessment and Goals of Care:  I have reviewed medical records including EPIC notes, labs and imaging, received report from patient's nurse, assessed the patient and then met at the bedside along with patient and her daughter, Angela Howe to discuss diagnosis prognosis, [place], EOL wishes, disposition and options.  I introduced Palliative Medicine as specialized medical care for people living with serious illness. It focuses on providing relief from the symptoms and stress of a serious illness. The goal is to improve quality of life for both the patient and the family.  We discussed a brief life review of the patient. She was married to her husband, Angela Howe who is deceased. She has four children. One, Angela Howe, lives locally and assists in her care. Patient lives at home and has a caretaker who helps with her home chores and ADL's.    As far as functional and  nutritional status- she tells me that before her recent fall she was doing well, but for the last few weeks she has had a difficult time getting around because she has been feeling weak. She has also had a decreased appetite, however, this morning she felt somewhat better and ate her whole breakfast and was able to enjoy it. Chart review does not show significant weight loss.   We discussed their current illness and what it means in the larger context of their on-going co-morbidities.  Natural disease trajectory and expectations at EOL were discussed. I asked her what her hopes were and she says, "I want to die and go see Angela Howe (her spouse)". She denies suicidal ideation. If she had an infection or some treatable illness, she states that she would want that treated and would not elect comfort only. We discussed more invasive procedures and tests and she said that she would discuss them with her family and decide then. She does want the blood transfusion. She says she wants to stay alive to see her daughter Angela Howe get remarried, but if she died, that would be ok because that would mean she would be reunited with Angela Howe.   We discussed her mood. She says she's not depressed. She cries only when  she gets mad- for example- she got mad because her two grandson's got divorced because their wives felt they didn't make enough money- that made her cry. She is sleeping well. She doesn't feel she sleeps too much during the day.    Advanced directives, concepts specific to code status, artifical feeding and hydration, and rehospitalization were considered and discussed. The patient has DNR in place. She would not want artifical feeding. Maintenance fluids and antibiotics as indicated are requested. She agrees to going to a rehab after she is stabilized during this hospitalization with palliative services following.   Hospice and Palliative Care services outpatient were explained and offered. They request outpatient follow  up services at the rehab facility at discharge.   Questions and concerns were addressed.    Primary Decision Maker NEXT OF KIN - patient's daughter- Angela Howe    SUMMARY OF RECOMMENDATIONS  -[place]- treat what is treatable- d/c to rehab facility with Palliative f/u for cont GOC -Recommend discussing each intervention thoroughly with patient and her daughter to ensure it aligns with GOC- do not think patient would be candidate for aggressive measures such as surgical interventions due to decreased functional and nutritional status- discussed this with patient and daughter and they are also aware  -Scheduled tylenol '650mg'$  q 8hrs for back pain -Lidoderm patches to lumbar and thoracic spine for pain -Refer for outpatient palliative to follow at d/c- anticipate patient may continue to decline and GOC may change -PT for mobility    Code Status/Advance Care Planning:  DNR  Palliative Prophylaxis:   Frequent Pain Assessment  Prognosis:    Unable to determine   Discharge Planning: [facility] for rehab with Palliative care service follow-up  Primary Diagnoses: Present on Admission: . Dehydration   I have reviewed the medical record, interviewed the patient and family, and examined the patient. The following aspects are pertinent.  Past Medical History:  Diagnosis Date  . Breast cancer ([place])   . CAD (coronary artery disease)   . Degenerative disc disease   . GERD (gastroesophageal reflux disease)   . Hyperlipidemia    Previous intolerance of statins  . Hypothyroidism   . Orthostatic hypotension   . Osteoarthritis   . Peptic ulcer disease   . Syncope    Social History   Social History  . Marital status: Widowed    Spouse name: N/A  . Number of children: N/A  . Years of education: N/A   Occupational History  . Retired Retired   Social History Main Topics  . Smoking status: Never Smoker  . Smokeless tobacco: Never Used  . Alcohol use No  . Drug use: No  .  Sexual activity: Not Asked   Other Topics Concern  . None   Social History Narrative   Widowed   Gets regular exercise   Family History  Problem Relation Age of Onset  . Heart attack Sister   . Cancer Brother   . Pancreatic cancer Brother   . Ovarian cancer Sister    Scheduled Meds: . acetaminophen  650 mg Oral Q8H  . amitriptyline  50 mg Oral QHS  . aspirin EC  81 mg Oral Daily  . atorvastatin  20 mg Oral Daily  . cephALEXin  500 mg Oral Q12H  . citalopram  20 mg Oral Daily  . docusate  50 mg Oral BID  . dronabinol  2.5 mg Oral BID AC  . fentaNYL  12.5 mcg Transdermal Q72H  . fludrocortisone  0.1 mg Oral  Daily  . levothyroxine  50 mcg Oral Daily  . lidocaine  2 patch Transdermal Q24H  . [START ON 02/22/2017] multivitamin with minerals  1 tablet Oral Daily  . pantoprazole  40 mg Oral Daily   Continuous Infusions: PRN Meds:.acetaminophen **OR** acetaminophen, ketorolac, nitroGLYCERIN, ondansetron **OR** ondansetron (ZOFRAN) IV Medications Prior to Admission:  Prior to Admission medications   Medication Sig Start Date End Date Taking? Authorizing Provider  aspirin EC 81 MG tablet Take 81 mg by mouth daily.   Yes [provider]  fentaNYL (DURAGESIC - DOSED MCG/HR) 12 MCG/HR Place 1 patch (12.5 mcg total) onto the skin every 3 (three) days. 10/13/15  Yes Wieting, Richard, MD  lansoprazole (PREVACID) 30 MG capsule Take 30 mg by mouth daily.     Yes [provider]  amitriptyline (ELAVIL) 50 MG tablet TAKE 1 TABLET BY MOUTH AT BEDTIME 12/08/16   Einar Pheasant, MD  atorvastatin (LIPITOR) 20 MG tablet Take 1 tablet (20 mg total) by mouth daily. 08/08/16   Einar Pheasant, MD  cephALEXin (KEFLEX) 500 MG capsule Take 1 capsule (500 mg total) by mouth 3 (three) times daily. 02/14/17 02/21/17  Rudene Re, MD  citalopram (CELEXA) 20 MG tablet Take 1 tablet (20 mg total) by mouth daily. 08/08/16   Einar Pheasant, MD  fludrocortisone (FLORINEF) 0.1 MG tablet  Take 1 tablet (0.1 mg total) by mouth daily. 03/22/15   Einar Pheasant, MD  levothyroxine (SYNTHROID, LEVOTHROID) 50 MCG tablet Take 1 tablet (50 mcg total) by mouth daily. 08/08/16   Einar Pheasant, MD  nitroGLYCERIN (NITROSTAT) 0.4 MG SL tablet Place 1 tablet (0.4 mg total) under the tongue every 5 (five) minutes as needed for chest pain. 07/10/14   Einar Pheasant, MD   Allergies  Allergen Reactions  . Alendronate Sodium Other (See Comments)    Reaction:  Restlessness   . Prednisone Other (See Comments)    Reaction:  Restlessness    Review of Systems  Constitutional: Positive for activity change and appetite change. Negative for unexpected weight change.  Respiratory: Negative for shortness of breath.   Cardiovascular: Negative for chest pain.  Musculoskeletal: Positive for back pain.  Psychiatric/Behavioral: Negative for sleep disturbance and suicidal ideas.    Physical Exam  Constitutional: She is oriented to person, place, and time.  Frail, elderly  HENT:  Head: Normocephalic.  Cardiovascular: Normal rate and regular rhythm.   Pulmonary/Chest: Effort normal and breath sounds normal.  Abdominal: Soft.  Neurological: She is alert and oriented to person, place, and time.  Skin: Skin is warm and dry. There is pallor.  Psychiatric: She has a normal mood and affect. Her behavior is normal. Judgment and thought content normal.  Nursing note and vitals reviewed.   Vital Signs: BP (!) 110/49   Pulse 84   Temp 98.3 F (36.8 C) (Oral)   Resp 18   Ht _0  (1.626 m)   Wt 46.7 kg (103 lb)   SpO2 95%   BMI 17.68 kg/m  Pain Assessment: No/denies pain   Pain Score: Asleep   SpO2: SpO2: 95 % O2 Device:SpO2: 95 % O2 Flow Rate: .   IO: Intake/output summary:  Intake/Output Summary (Last 24 hours) at 02/21/17 1413 Last data filed at 02/21/17 1015  Gross per 24 hour  Intake             1380 ml  Output                0 ml  Net  1380 ml    LBM: Last BM Date:  02/19/17 Baseline Weight: Weight: 46.7 kg (103 lb) Most recent weight: Weight: 46.7 kg (103 lb)     Palliative Assessment/Data:PPS: 40%     Thank you for this consult. Palliative medicine will continue to follow and assist as needed.   Time In: 1200 Time Out: 1330 Time Total: 90 minutes Greater than 50%  of this time was spent counseling and coordinating care related to the above assessment and plan.  Signed by: Mariana Kaufman, AGNP-C Palliative Medicine    Please contact Palliative Medicine Team phone at [phone] for questions and concerns.  For individual provider: See Shea Evans

## 2017-02-21 NOTE — Clinical Social Work Note (Signed)
CSW received consult for patient needing SNF for short term rehab.  CSW awaiting PT recommendations.  Jones Broom. Benedict, MSW, [place]  02/21/2017 4:41 PM

## 2017-02-21 NOTE — Consult Note (Signed)
[facility] Consult   Reason for Consult: Consult for 83 year old woman for "depression" Referring Physician: Posey Pronto Patient Identification: Angela Howe MRN:  [id number] Principal Diagnosis: Subacute delirium Diagnosis:   Patient Active Problem List   Diagnosis Date Noted  . Subacute delirium [F05] 02/21/2017  . Palliative care by specialist [Z51.5]   . Advance care planning [Z71.89]   . Goals of care, counseling/discussion [Z71.89]   . Dehydration [E86.0] 02/20/2017  . Pancolitis ([place]) [K51.00] 12/21/2016  . Unsteady gait [R26.81] 08/13/2016  . Falls frequently [R29.6] 07/04/2016  . Chest pain [R07.9] 10/12/2015  . Rib fracture [S22.39XA] 10/03/2015  . Right carotid bruit [R09.89] 08/01/2015  . Loss of weight [R63.4] 01/12/2015  . Anemia [D64.9] 07/18/2014  . Health care maintenance [Z00.00] 07/18/2014  . Hoarseness [R49.0] 07/18/2014  . Abdominal pain [R10.9] 05/05/2014  . Back pain [M54.9] 10/05/2013  . Knee pain [M25.569] 07/14/2013  . Oral pain [K13.79] 05/06/2013  . Itching [L29.9] 01/05/2013  . History of breast cancer [Z85.3] 11/20/2012  . Syncope [R55] 11/20/2012  . Hypothyroidism [E03.9] 08/10/2012  . Difficulty sleeping [G47.9] 08/10/2012  . Dizziness [R42] 11/08/2010  . Hyperlipemia [E78.5] 03/23/2009  . HYPERTENSION, BENIGN [I10] 03/23/2009  . CAD, NATIVE VESSEL [I25.10] 09/24/2008  . ORTHOSTATIC HYPOTENSION [I95.1] 09/24/2008  . ABNORMAL EKG [R94.31] 09/24/2008    Total Time spent with patient: 1 hour  Subjective:   Angela Howe is a 83 y.o. female patient admitted with "I have been having weakness".  HPI: Patient interviewed chart reviewed.  83 year old multiple recent presentations for weakness and falls.  Patient has not been eating well.  Seems to be dwindling in general.  I suspect this is probably where the main concern for depression came from.  On interview today I found the patient to be awake in bed and cooperative  with interview but to have waxing and waning alertness and attention.  Patient is aware that she is in [facility] and in [place].  Thought that the year was 1918 and did not correct herself.  Did not know the year.  Patient was aware that she was in the hospital because of weakness.  When asked about falls he started in on a story and rapidly became tangential veering off into a story that made little sense and almost sounded like she was recounting a dream.  She did this multiple times during the interview and although I was able to recall her to lucidity when she spoke at length she always veered off into some confusion.  Patient denied any sense of being depressed however.  Denied any sense of sadness or hopelessness.  Admits that she sometimes has trouble sleeping.  Admits that she is not eating well but does not seem to make much of it.  She denies suicidal thoughts completely.  Denies any hallucinations or psychotic symptoms.  Patient says she does have positive things in her life to look forward to and that make life worth living.  When I asked her what those would be she started in to talk about her lunchtime meal and then once again veered off into a nonsensical narrative.  Patient was smiling and pleasant however throughout the interview.  Social history: Patient tells me that she lives at home alone.  I looked through the chart and I am not sure if this is entirely correct.  He tells me that her husband died in 35 and that she has 2 children.  I do see mention of one daughter  in the chart.  Medical history: Multiple medical problems Including history of cancer coronary artery disease chronic back pain gastric reflux recent falls and weakness.  Substance abuse history: None Past Psychiatric History: Patient denied being aware of any health treatment.  Denied ever seeing a psychiatrist or therapist.  Denied any history of psychiatric medicine.  Patient denied any history of  suicidality no history of hospitalization.  Looking at her medicines before admission it does look like she was on citalopram 20 mg a day.  Not clear to me what this was initially prescribed for or how long she has been on it.  She does not have a depression or anxiety diagnosis in her problem list.  She is on amitriptyline 50 mg at night and this also appears to have been in place for quite a while.  Patient had no knowledge of either of these medicines and could not tell me why she was taking them.  I suspect the Elavil dose was added for chronic pain and sleep at some point.  Risk to Self: Is patient at risk for suicide?: No Risk to Others:   Prior Inpatient Therapy:   Prior Outpatient Therapy:    Past Medical History:  Past Medical History:  Diagnosis Date  . Breast cancer ([place])   . CAD (coronary artery disease)   . Degenerative disc disease   . GERD (gastroesophageal reflux disease)   . Hyperlipidemia    Previous intolerance of statins  . Hypothyroidism   . Orthostatic hypotension   . Osteoarthritis   . Peptic ulcer disease   . Syncope     Past Surgical History:  Procedure Laterality Date  . ABDOMINAL HYSTERECTOMY  1970  . APPENDECTOMY    . BIOPSY BREAST  2014  . BREAST EXCISIONAL BIOPSY Left 2014   neg  . BREAST EXCISIONAL BIOPSY Left 2006   postive radation  . BREAST LUMPECTOMY  2006  . CHOLECYSTECTOMY    . CORONARY ARTERY BYPASS GRAFT  05/2006   x3  . NASAL SINUS SURGERY    . ROTATOR CUFF REPAIR     Family History:  Family History  Problem Relation Age of Onset  . Heart attack Sister   . Cancer Brother   . Pancreatic cancer Brother   . Ovarian cancer Sister    Family Psychiatric  History: She says he had a sister who was "sad". Social History:  History  Alcohol Use No     History  Drug Use No    Social History   Social History  . Marital status: Widowed    Spouse name: N/A  . Number of children: N/A  . Years of education: N/A   Occupational  History  . Retired Retired   Social History Main Topics  . Smoking status: Never Smoker  . Smokeless tobacco: Never Used  . Alcohol use No  . Drug use: No  . Sexual activity: Not Asked   Other Topics Concern  . None   Social History Narrative   Widowed   Gets regular exercise   Additional Social History:    Allergies:   Allergies  Allergen Reactions  . Alendronate Sodium Other (See Comments)    Reaction:  Restlessness   . Prednisone Other (See Comments)    Reaction:  Restlessness     Labs:  Results for orders placed or performed during the hospital encounter of 02/20/17 (from the past 48 hour(s))  CBC with Differential/Platelet     Status: Abnormal   Collection Time:  02/20/17 11:47 AM  Result Value Ref Range   WBC 15.3 (H) 3.6 - 11.0 K/uL   RBC 3.03 (L) 3.80 - 5.20 MIL/uL   Hemoglobin 9.0 (L) 12.0 - 16.0 g/dL   HCT 27.4 (L) 35.0 - 47.0 %   MCV 90.5 80.0 - 100.0 fL   MCH 29.7 26.0 - 34.0 pg   MCHC 32.9 32.0 - 36.0 g/dL   RDW 14.6 (H) 11.5 - 14.5 %   Platelets 450 (H) 150 - 440 K/uL   Neutrophils Relative % 88 %   Neutro Abs 13.4 (H) 1.4 - 6.5 K/uL   Lymphocytes Relative 9 %   Lymphs Abs 1.4 1.0 - 3.6 K/uL   Monocytes Relative 3 %   Monocytes Absolute 0.5 0.2 - 0.9 K/uL   Eosinophils Relative 0 %   Eosinophils Absolute 0.0 0 - 0.7 K/uL   Basophils Relative 0 %   Basophils Absolute 0.0 0 - 0.1 K/uL  Basic metabolic panel     Status: Abnormal   Collection Time: 02/20/17 11:47 AM  Result Value Ref Range   Sodium 140 135 - 145 mmol/L   Potassium 4.5 3.5 - 5.1 mmol/L   Chloride 109 101 - 111 mmol/L   CO2 21 (L) 22 - 32 mmol/L   Glucose, Bld 116 (H) 65 - 99 mg/dL   BUN 58 (H) 6 - 20 mg/dL   Creatinine, Ser 0.92 0.44 - 1.00 mg/dL   Calcium 8.8 (L) 8.9 - 10.3 mg/dL   GFR calc non Af Amer 54 (L) >60 mL/min   GFR calc Af Amer >60 >60 mL/min    Comment: (NOTE) The eGFR has been calculated using the CKD EPI equation. This calculation has not been validated in all  clinical situations. eGFR's persistently <60 mL/min signify possible Chronic Kidney Disease.    Anion gap 10 5 - 15  Troponin I     Status: None   Collection Time: 02/20/17 11:47 AM  Result Value Ref Range   Troponin I <0.03 <0.03 ng/mL  Magnesium     Status: None   Collection Time: 02/20/17 11:47 AM  Result Value Ref Range   Magnesium 2.2 1.7 - 2.4 mg/dL  Urinalysis, Routine w reflex microscopic     Status: Abnormal   Collection Time: 02/20/17 11:47 AM  Result Value Ref Range   Color, Urine YELLOW (A) YELLOW   APPearance CLEAR (A) CLEAR   Specific Gravity, Urine 1.029 1.005 - 1.030   pH 5.0 5.0 - 8.0   Glucose, UA NEGATIVE NEGATIVE mg/dL   Hgb urine dipstick NEGATIVE NEGATIVE   Bilirubin Urine NEGATIVE NEGATIVE   Ketones, ur 5 (A) NEGATIVE mg/dL   Protein, ur NEGATIVE NEGATIVE mg/dL   Nitrite NEGATIVE NEGATIVE   Leukocytes, UA NEGATIVE NEGATIVE  Urine Culture     Status: None   Collection Time: 02/20/17 11:56 AM  Result Value Ref Range   Specimen Description URINE, CATHETERIZED    Special Requests Normal    Culture      NO GROWTH Performed at [facility], [address] [place], [place] [address]    Report Status 02/21/2017 FINAL   Basic metabolic panel     Status: Abnormal   Collection Time: 02/21/17  4:06 AM  Result Value Ref Range   Sodium 142 135 - 145 mmol/L   Potassium 4.0 3.5 - 5.1 mmol/L   Chloride 116 (H) 101 - 111 mmol/L   CO2 20 (L) 22 - 32 mmol/L   Glucose, Bld  89 65 - 99 mg/dL   BUN 54 (H) 6 - 20 mg/dL   Creatinine, Ser 1.08 (H) 0.44 - 1.00 mg/dL   Calcium 7.9 (L) 8.9 - 10.3 mg/dL   GFR calc non Af Amer 44 (L) >60 mL/min   GFR calc Af Amer 51 (L) >60 mL/min    Comment: (NOTE) The eGFR has been calculated using the CKD EPI equation. This calculation has not been validated in all clinical situations. eGFR's persistently <60 mL/min signify possible Chronic Kidney Disease.    Anion gap 6 5 - 15  Folate     Status: None   Collection Time:  02/21/17  4:06 AM  Result Value Ref Range   Folate 24.0 >5.9 ng/mL  Iron and TIBC     Status: Abnormal   Collection Time: 02/21/17  4:06 AM  Result Value Ref Range   Iron 37 28 - 170 ug/dL   TIBC 205 (L) 250 - 450 ug/dL   Saturation Ratios 18 10.4 - 31.8 %   UIBC 168 ug/dL  Ferritin     Status: None   Collection Time: 02/21/17  4:06 AM  Result Value Ref Range   Ferritin 83 11 - 307 ng/mL  Reticulocytes     Status: Abnormal   Collection Time: 02/21/17  4:06 AM  Result Value Ref Range   Retic Ct Pct 3.0 0.4 - 3.1 %   RBC. 2.26 (L) 3.80 - 5.20 MIL/uL   Retic Count, Absolute 67.8 19.0 - 183.0 K/uL  CBC     Status: Abnormal   Collection Time: 02/21/17  5:04 AM  Result Value Ref Range   WBC 13.1 (H) 3.6 - 11.0 K/uL   RBC 2.29 (L) 3.80 - 5.20 MIL/uL   Hemoglobin 7.0 (L) 12.0 - 16.0 g/dL   HCT 20.9 (L) 35.0 - 47.0 %   MCV 91.1 80.0 - 100.0 fL   MCH 30.4 26.0 - 34.0 pg   MCHC 33.4 32.0 - 36.0 g/dL   RDW 14.5 11.5 - 14.5 %   Platelets 334 150 - 440 K/uL  ABO/Rh     Status: None   Collection Time: 02/21/17  5:04 AM  Result Value Ref Range   ABO/RH(D) A POS   Vitamin B12     Status: None   Collection Time: 02/21/17  9:50 AM  Result Value Ref Range   Vitamin B-12 654 180 - 914 pg/mL    Comment: (NOTE) This assay is not validated for testing neonatal or myeloproliferative syndrome specimens for Vitamin B12 levels. Performed at [facility], [place] [address], [place], [place] [address]   Type and screen [facility]     Status: None (Preliminary result)   Collection Time: 02/21/17  9:50 AM  Result Value Ref Range   ABO/RH(D) A POS    Antibody Screen NEG    Sample Expiration 02/24/2017    Unit Number [id number]    Blood Component Type RED CELLS,LR    Unit division 00    Status of Unit ISSUED    Transfusion Status OK TO TRANSFUSE    Crossmatch Result Compatible   Prepare RBC     Status: None   Collection Time: 02/21/17  9:50 AM  Result Value Ref  Range   Order Confirmation ORDER PROCESSED BY BLOOD BANK     Current Facility-Administered Medications  Medication Dose Route Frequency Provider Last Rate Last Dose  . acetaminophen (TYLENOL) tablet 650 mg  650 mg Oral Q6H PRN Abel Presto  J, MD   650 mg at 02/20/17 1603   Or  . acetaminophen (TYLENOL) suppository 650 mg  650 mg Rectal Q6H PRN Henreitta Leber, MD      . acetaminophen (TYLENOL) tablet 650 mg  650 mg Oral Q8H Mahan, Kasie J, NP      . amitriptyline (ELAVIL) tablet 50 mg  50 mg Oral QHS Henreitta Leber, MD   50 mg at 02/20/17 2037  . aspirin EC tablet 81 mg  81 mg Oral Daily Henreitta Leber, MD   81 mg at 02/21/17 0853  . atorvastatin (LIPITOR) tablet 20 mg  20 mg Oral Daily Henreitta Leber, MD   20 mg at 02/21/17 0851  . cephALEXin (KEFLEX) capsule 500 mg  500 mg Oral Q12H Dustin Flock, MD   500 mg at 02/21/17 0857  . citalopram (CELEXA) tablet 20 mg  20 mg Oral Daily Henreitta Leber, MD   20 mg at 02/21/17 0851  . docusate (COLACE) 50 MG/5ML liquid 50 mg  50 mg Oral BID Dustin Flock, MD   50 mg at 02/21/17 1341  . dronabinol (MARINOL) capsule 2.5 mg  2.5 mg Oral BID AC Henreitta Leber, MD   2.5 mg at 02/21/17 1341  . fentaNYL (DURAGESIC - dosed mcg/hr) 12.5 mcg  12.5 mcg Transdermal Q72H Henreitta Leber, MD   12.5 mcg at 02/20/17 1603  . fludrocortisone (FLORINEF) tablet 0.1 mg  0.1 mg Oral Daily Henreitta Leber, MD   0.1 mg at 02/21/17 0850  . ketorolac (TORADOL) 15 MG/ML injection 15 mg  15 mg Intravenous Q6H PRN Henreitta Leber, MD   15 mg at 02/21/17 0852  . levothyroxine (SYNTHROID, LEVOTHROID) tablet 50 mcg  50 mcg Oral Daily Henreitta Leber, MD   50 mcg at 02/21/17 0853  . lidocaine (LIDODERM) 5 % 2 patch  2 patch Transdermal Q24H Earlie Counts, NP   2 patch at 02/21/17 1635  . [START ON 02/22/2017] multivitamin with minerals tablet 1 tablet  1 tablet Oral Daily Dustin Flock, MD      . nitroGLYCERIN (NITROSTAT) SL tablet 0.4 mg  0.4 mg Sublingual Q5  min PRN Sainani, Belia Heman, MD      . ondansetron (ZOFRAN) tablet 4 mg  4 mg Oral Q6H PRN Henreitta Leber, MD       Or  . ondansetron (ZOFRAN) injection 4 mg  4 mg Intravenous Q6H PRN Henreitta Leber, MD      . pantoprazole (PROTONIX) EC tablet 40 mg  40 mg Oral Daily Kameria, Canizares, RPH   40 mg at 02/21/17 4665    Musculoskeletal: Strength & Muscle Tone: decreased and atrophy Gait & Station: unable to stand Patient leans: N/A  Psychiatric Specialty Exam: Physical Exam  Nursing note and vitals reviewed. Constitutional: She appears well-developed.  HENT:  Head: Normocephalic and atraumatic.  Eyes: Pupils are equal, round, and reactive to light. Conjunctivae are normal.  Neck: Normal range of motion.  Cardiovascular: Normal heart sounds.   Respiratory: Effort normal.  GI: Soft.  Musculoskeletal: Normal range of motion.  Neurological: She is alert.  Skin: Skin is warm and dry.  Psychiatric: She has a normal mood and affect. Her speech is tangential. She is slowed. Thought content is not paranoid. She expresses no homicidal and no suicidal ideation. She exhibits abnormal recent memory and abnormal remote memory.    Review of Systems  HENT: Negative.   Eyes: Negative.   Respiratory:  Negative.   Cardiovascular: Negative.   Gastrointestinal: Negative.   Musculoskeletal: Positive for falls.  Skin: Negative.   Neurological: Positive for weakness.  Psychiatric/Behavioral: Negative for depression, hallucinations, memory loss, substance abuse and suicidal ideas. The patient is not nervous/anxious and does not have insomnia.     Blood pressure (!) 123/55, pulse 87, temperature 98 F (36.7 C), temperature source Axillary, resp. rate 16, height 5' 4" (1.626 m), weight 103 lb (46.7 kg), SpO2 92 %.Body mass index is 17.68 kg/m.  General Appearance: Casual  Eye Contact:  Minimal  Speech:  Slow  Volume:  Decreased  Mood:  Euthymic  Affect:  Congruent  Thought Process:  Disorganized   Orientation:  Other:  Partially.  This would wax and wane during the interview.  During times when she was focusing her attention more she was able to give me some pretty lucid answers.  At other times she seemed to drift into a lot of confusion.  Thought Content:  Rumination and Tangential  Suicidal Thoughts:  No  Homicidal Thoughts:  No  Memory:  Immediate;   Fair Recent;   Poor Remote;   Fair  Judgement:  Fair  Insight:  Fair  Psychomotor Activity:  Decreased  Concentration:  Concentration: Poor  Recall:  [employer] of Knowledge:  Fair  Language:  Good  Akathisia:  No  Handed:  Right  AIMS (if indicated):     Assets:  Desire for Improvement Social Support  ADL's:  Impaired  Cognition:  Impaired,  Mild  Sleep:        Treatment Plan Summary: Medication management and Plan 83 year old woman who is not eating well having more falls having more weakness.  On my interview today the patient did not appear to be depressed.  Did not endorse mood symptoms.  Did not appear to be hopeless.  What she did appear to be was intermittently delirious and confused.  He certainly has multiple medical problems that could contribute to this.  The interview was a little bit difficult and it was hard to determine how much dementia she had although it certainly does not seem like a severe amount given that she is still able to describe a lot of her recent events and knows where she is when she pays attention.  I think rather than adding any kind of medication for depression that I would prefer to try and cut back a little bit on some of her medicine.  I am particularly concerned about the amitriptyline.  Amitriptyline is an extremely anticholinergic medicine well known for increasing fall risk and confusion especially in elderly people.  It may be that as she has gotten older she is less able to tolerate a dose that was previously tolerable.  I am going to cut this back to 25 mg for now.  I will continue to  follow up as needed.  Disposition: Patient does not meet criteria for psychiatric inpatient admission. Supportive therapy provided about ongoing stressors.  Alethia Berthold, MD 02/21/2017 5:40 PM

## 2017-02-21 NOTE — Progress Notes (Signed)
[place] [facility]                                                                                                                                                                                  Patient Demographics   Tatanisha Cuthbert, is a 83 y.o. female, DOB - [birth year], WCB:[id number]  Admit date - 02/20/2017   Admitting Physician Henreitta Leber, MD  Outpatient Primary MD for the patient is Einar Pheasant, MD   LOS - 1  Subjective: Patient admitted with generalized weakness and falls Patient very weak Poor historian    Review of Systems:   CONSTITUTIONAL: No documented fever. No fatigue,positive weakness. No weight gain, no weight loss.  EYES: No blurry or double vision.  ENT: No tinnitus. No postnasal drip. No redness of the oropharynx.  RESPIRATORY: No cough, no wheeze, no hemoptysis. No dyspnea.  CARDIOVASCULAR: No chest pain. No orthopnea. No palpitations. No syncope.  GASTROINTESTINAL: No nausea, no vomiting or diarrhea. No abdominal pain. No melena or hematochezia.  GENITOURINARY: No dysuria or hematuria.  ENDOCRINE: No polyuria or nocturia. No heat or cold intolerance.  HEMATOLOGY: No anemia. No bruising. No bleeding.  INTEGUMENTARY: No rashes. No lesions.  MUSCULOSKELETAL: No arthritis. No swelling. No gout.  NEUROLOGIC: No numbness, tingling, or ataxia. No seizure-type activity.  PSYCHIATRIC: No anxiety. No insomnia. No ADD.    Vitals:   Vitals:   02/20/17 2011 02/21/17 0435 02/21/17 0800 02/21/17 1346  BP: (!) 125/50 (!) 118/39 124/80 (!) 110/49  Pulse: 91 80 87 84  Resp: 17 16  18   Temp: 98.3 F (36.8 C) 98.3 F (36.8 C) 98.7 F (37.1 C) 98.3 F (36.8 C)  TempSrc: Oral Oral Oral Oral  SpO2: 100% 100%  95%  Weight:      Height:        Wt Readings from Last 3 Encounters:  02/20/17 103 lb (46.7 kg)  02/14/17 104 lb (47.2 kg)  02/12/17 110 lb (49.9 kg)     Intake/Output Summary (Last 24 hours) at 02/21/17 1359 Last  data filed at 02/21/17 1015  Gross per 24 hour  Intake             1380 ml  Output                0 ml  Net             1380 ml    Physical Exam:   GENERAL: Pleasant-appearing in no apparent distress.  HEAD, EYES, EARS, NOSE AND THROAT: Atraumatic, normocephalic. Extraocular muscles are intact. Pupils equal and reactive to light. Sclerae anicteric. No conjunctival injection. No oro-pharyngeal erythema.  NECK:  Supple. There is no jugular venous distention. No bruits, no lymphadenopathy, no thyromegaly.  HEART: Regular rate and rhythm,. No murmurs, no rubs, no clicks.  LUNGS: Clear to auscultation bilaterally. No rales or rhonchi. No wheezes.  ABDOMEN: Soft, flat, nontender, nondistended. Has good bowel sounds. No hepatosplenomegaly appreciated.  EXTREMITIES: No evidence of any cyanosis, clubbing, or peripheral edema.  +2 pedal and radial pulses bilaterally.  NEUROLOGIC: The patient is alert, awake,  with no focal motor or sensory deficits appreciated bilaterally.  SKIN: Moist and warm with no rashes appreciated.  Psych: Not anxious, depressed LN: No inguinal LN enlargement    Antibiotics   Anti-infectives    Start     Dose/Rate Route Frequency Ordered Stop   02/20/17 1530  cephALEXin (KEFLEX) capsule 500 mg     500 mg Oral Every 12 hours 02/20/17 1522 02/21/17 2359      Medications   Scheduled Meds: . amitriptyline  50 mg Oral QHS  . aspirin EC  81 mg Oral Daily  . atorvastatin  20 mg Oral Daily  . cephALEXin  500 mg Oral Q12H  . citalopram  20 mg Oral Daily  . docusate  50 mg Oral BID  . dronabinol  2.5 mg Oral BID AC  . fentaNYL  12.5 mcg Transdermal Q72H  . fludrocortisone  0.1 mg Oral Daily  . levothyroxine  50 mcg Oral Daily  . [START ON 02/22/2017] multivitamin with minerals  1 tablet Oral Daily  . pantoprazole  40 mg Oral Daily   Continuous Infusions: PRN Meds:.acetaminophen **OR** acetaminophen, ketorolac, nitroGLYCERIN, ondansetron **OR** ondansetron (ZOFRAN)  IV   Data Review:   Micro Results Recent Results (from the past 240 hour(s))  Urine Culture     Status: Abnormal   Collection Time: 02/14/17  5:19 PM  Result Value Ref Range Status   Specimen Description URINE, RANDOM  Final   Special Requests NONE  Final   Culture >=100,000 COLONIES/mL ESCHERICHIA COLI (A)  Final   Report Status 02/17/2017 FINAL  Final   Organism ID, Bacteria ESCHERICHIA COLI (A)  Final      Susceptibility   Escherichia coli - MIC*    AMPICILLIN 8 SENSITIVE Sensitive     CEFAZOLIN <=4 SENSITIVE Sensitive     CEFTRIAXONE <=1 SENSITIVE Sensitive     CIPROFLOXACIN <=0.25 SENSITIVE Sensitive     GENTAMICIN <=1 SENSITIVE Sensitive     IMIPENEM <=0.25 SENSITIVE Sensitive     NITROFURANTOIN <=16 SENSITIVE Sensitive     TRIMETH/SULFA <=20 SENSITIVE Sensitive     AMPICILLIN/SULBACTAM 4 SENSITIVE Sensitive     PIP/TAZO <=4 SENSITIVE Sensitive     Extended ESBL NEGATIVE Sensitive     * >=100,000 COLONIES/mL ESCHERICHIA COLI    Radiology Reports Dg Ribs Unilateral W/chest Left  Result Date: 02/14/2017 CLINICAL DATA:  Recent fall.  Chest pain EXAM: LEFT RIBS AND CHEST - 3+ VIEW COMPARISON:  10/12/2015 FINDINGS: CABG. Negative for heart failure. Lungs are clear without infiltrate effusion or pneumothorax Negative for left rib fracture. IMPRESSION: Negative. Electronically Signed   By: Franchot Gallo M.D.   On: 02/14/2017 18:43   Dg Thoracic Spine 2 View  Result Date: 02/12/2017 CLINICAL DATA:  Multiple falls most recently this morning. Patient reports upper and lower back pain. EXAM: THORACIC SPINE 2 VIEWS COMPARISON:  Thoracic spine series of January 07, 2016 FINDINGS: The patient has undergone previous kyphoplasty in the lower lumbar spine. There are compression fractures of T8, T9, and T11 which are stable. There is  mild anterior wedging of the bodies of T5 and T2. The T2 compression appears new. There are no abnormal paravertebral soft tissue densities. The pedicles  appear intact. IMPRESSION: Stable compressions of T5, T8, T9, and T11. The latter 3 are high-grade. There is new mild anterior compression of the body of T10. Electronically Signed   By: David  Martinique M.D.   On: 02/12/2017 12:51   Dg Lumbar Spine 2-3 Views  Result Date: 02/12/2017 CLINICAL DATA:  Multiple falls, most recent this morning. Back pain. EXAM: LUMBAR SPINE - 2-3 VIEW COMPARISON:  12/21/2016 CT abdomen/ pelvis. FINDINGS: This report assumes 5 non rib-bearing lumbar vertebrae. Diffuse osteopenia. Severe T8 vertebral compression fracture, chronic, with interval height loss since 10/12/2015 chest CT. Severe T9 vertebral compression fracture, chronic, with interval height loss since 10/12/2015 chest CT. Severe T11 vertebral compression fracture, chronic, stable since 10/12/2015 chest CT. Moderate L3 vertebral compression fracture status post vertebroplasty, stable since 12/21/2016 CT abdomen/pelvis study. Moderate to severe L4 vertebral compression fracture, stable since 12/21/2016 CT. Remaining visualized thoracolumbar vertebral body heights are preserved with no acute fracture. Moderate to severe multilevel degenerative disc disease in the visualized thoracolumbar spine, most prominent at L2-3 and L3-4, not appreciably changed. No spondylolisthesis. Mild facet arthropathy bilaterally in the lower lumbar spine. No aggressive appearing focal osseous lesions. Abdominal aortic atherosclerosis. Visualized lower sternotomy wires appear intact. Cholecystectomy clips are seen in the right upper quadrant of the abdomen. IMPRESSION: 1. No acute vertebral compression fracture in the visualized thoracolumbar spine . 2. Multiple chronic vertebral compression fractures in the visualized thoracolumbar spine, some of which demonstrate interval worsening loss of vertebral body height as detailed. 3. No spondylolisthesis. 4. Moderate to severe multilevel degenerative disc disease. Electronically Signed   By: Ilona Sorrel  M.D.   On: 02/12/2017 12:53   Ct Head Wo Contrast  Result Date: 02/12/2017 CLINICAL DATA:  Mechanical fall this morning. Head injury on door knob. Initial encounter. EXAM: CT HEAD WITHOUT CONTRAST TECHNIQUE: Contiguous axial images were obtained from the base of the skull through the vertex without intravenous contrast. COMPARISON:  10/01/2015 FINDINGS: Brain: No evidence of acute infarction, hemorrhage, hydrocephalus, extra-axial collection or mass lesion/mass effect. Generalized atrophy and confluent chronic small vessel ischemia in the periventricular white matter. Remote lacunar infarct in the left caudate head. Vascular: Atherosclerotic calcification.  No hyperdense vessel. Skull: Left parietal scalp laceration without fracture. Sinuses/Orbits: No evidence of injury. Bilateral cataract resection. IMPRESSION: 1. No evidence of intracranial injury. 2. Left parietal scalp laceration without fracture. Electronically Signed   By: Monte Fantasia M.D.   On: 02/12/2017 12:59   Ct Chest W Contrast  Result Date: 02/21/2017 CLINICAL DATA:  None localized abdominal pain and unintended weight loss. Adult failure to thrive. Personal history of breast carcinoma. EXAM: CT CHEST, ABDOMEN, AND PELVIS WITH CONTRAST TECHNIQUE: Multidetector CT imaging of the chest, abdomen and pelvis was performed following the standard protocol during bolus administration of intravenous contrast. CONTRAST:  88mL ISOVUE-300 IOPAMIDOL (ISOVUE-300) INJECTION 61% COMPARISON:  Chest CT on 10/12/2015 and AP CT on 02/14/2017 FINDINGS: CT CHEST FINDINGS Cardiovascular: No acute findings. Aortic and coronary artery atherosclerosis. Prior CABG. Mediastinum/Lymph Nodes: No masses or pathologically enlarged lymph nodes identified. Stable post lumpectomy changes in left breast. Lungs/Pleura: No pulmonary infiltrate or mass identified. Mild bibasilar scarring. No effusion present. Musculoskeletal: Stable compression fractures of T6, T8, T9, and T11  vertebral bodies. CT ABDOMEN AND PELVIS FINDINGS Hepatobiliary: No masses identified. Prior cholecystectomy. No evidence of biliary obstruction. Pancreas:  No mass or inflammatory changes. Spleen:  Within normal limits in size and appearance. Adrenals/Urinary tract: No masses or hydronephrosis. Stable small left renal cyst. Stomach/Bowel: Stable small hiatal hernia. Stable gastric antrum wall thickening with gas containing ulcer in the superior wall. No evidence free intraperitoneal air. Sigmoid diverticulosis, without evidence of diverticulitis . No evidence of bowel obstruction. Vascular/Lymphatic: No pathologically enlarged lymph nodes identified. No abdominal aortic aneurysm. Aortic atherosclerosis. Reproductive: Prior hysterectomy noted. Adnexal regions are unremarkable in appearance. Other:  None. Musculoskeletal: No suspicious bone lesions identified. Previous L3 and L4 vertebroplasties. IMPRESSION: No significant change in appearance of gastric antral ulcer. No evidence of perforation or other complication. Stable small hiatal hernia. Colonic diverticulosis, without radiographic evidence of diverticulitis. Aortic and coronary artery atherosclerosis. Old thoracic and lumbar vertebral compression fracture deformities. Previous L3 and L4 vertebroplasties. Electronically Signed   By: Earle Gell M.D.   On: 02/21/2017 12:53   Ct Abdomen Pelvis W Contrast  Result Date: 02/21/2017 CLINICAL DATA:  None localized abdominal pain and unintended weight loss. Adult failure to thrive. Personal history of breast carcinoma. EXAM: CT CHEST, ABDOMEN, AND PELVIS WITH CONTRAST TECHNIQUE: Multidetector CT imaging of the chest, abdomen and pelvis was performed following the standard protocol during bolus administration of intravenous contrast. CONTRAST:  17mL ISOVUE-300 IOPAMIDOL (ISOVUE-300) INJECTION 61% COMPARISON:  Chest CT on 10/12/2015 and AP CT on 02/14/2017 FINDINGS: CT CHEST FINDINGS Cardiovascular: No acute  findings. Aortic and coronary artery atherosclerosis. Prior CABG. Mediastinum/Lymph Nodes: No masses or pathologically enlarged lymph nodes identified. Stable post lumpectomy changes in left breast. Lungs/Pleura: No pulmonary infiltrate or mass identified. Mild bibasilar scarring. No effusion present. Musculoskeletal: Stable compression fractures of T6, T8, T9, and T11 vertebral bodies. CT ABDOMEN AND PELVIS FINDINGS Hepatobiliary: No masses identified. Prior cholecystectomy. No evidence of biliary obstruction. Pancreas:  No mass or inflammatory changes. Spleen:  Within normal limits in size and appearance. Adrenals/Urinary tract: No masses or hydronephrosis. Stable small left renal cyst. Stomach/Bowel: Stable small hiatal hernia. Stable gastric antrum wall thickening with gas containing ulcer in the superior wall. No evidence free intraperitoneal air. Sigmoid diverticulosis, without evidence of diverticulitis . No evidence of bowel obstruction. Vascular/Lymphatic: No pathologically enlarged lymph nodes identified. No abdominal aortic aneurysm. Aortic atherosclerosis. Reproductive: Prior hysterectomy noted. Adnexal regions are unremarkable in appearance. Other:  None. Musculoskeletal: No suspicious bone lesions identified. Previous L3 and L4 vertebroplasties. IMPRESSION: No significant change in appearance of gastric antral ulcer. No evidence of perforation or other complication. Stable small hiatal hernia. Colonic diverticulosis, without radiographic evidence of diverticulitis. Aortic and coronary artery atherosclerosis. Old thoracic and lumbar vertebral compression fracture deformities. Previous L3 and L4 vertebroplasties. Electronically Signed   By: Earle Gell M.D.   On: 02/21/2017 12:53   Ct Abdomen Pelvis W Contrast  Result Date: 02/14/2017 CLINICAL DATA:  Recurrent falls. Known compression fracture. LEFT flank pain. History of breast cancer, peptic ulcer disease, appendectomy, hysterectomy and  cholecystectomy. EXAM: CT ABDOMEN AND PELVIS WITH CONTRAST TECHNIQUE: Multidetector CT imaging of the abdomen and pelvis was performed using the standard protocol following bolus administration of intravenous contrast. CONTRAST:  35mL ISOVUE-300 IOPAMIDOL (ISOVUE-300) INJECTION 61% COMPARISON:  CT abdomen and pelvis December 21, 2016 and lumbar spine radiographs February 12, 2017. Mammogram report January 25, 2016. FINDINGS: LOWER CHEST: Lung bases are clear. Included heart size is normal. Mitral annular calcifications. No pericardial effusion. Status post median sternotomy. HEPATOBILIARY: Scattered hepatic granulomas, liver is otherwise unremarkable. Status post cholecystectomy. PANCREAS: Atrophic, nonacute. SPLEEN: Normal. ADRENALS/URINARY  TRACT: Kidneys are orthotopic, demonstrating symmetric enhancement. No nephrolithiasis, hydronephrosis or solid renal masses. Mild LEFT pelvicaliectasis. Multiple homogeneously hypodense benign-appearing LEFT renal cysts measuring to 2.5 cm. The unopacified ureters are normal in course and caliber. Delayed imaging through the kidneys demonstrates symmetric prompt contrast excretion within the proximal urinary collecting system. Urinary bladder is well distended and unremarkable. Normal adrenal glands. STOMACH/BOWEL: Small hiatal hernia. Severe sigmoid diverticulosis. No significant residual colonic bowel wall thickening though sensitivity decreased without oral contrast. Thickened, edematous gastric antrum on proximal duodenum. 1.2 cm gastric antral ulcer superior wall. VASCULAR/LYMPHATIC: Aortoiliac vessels are normal in course and caliber. Moderate to severe calcific atherosclerosis . No lymphadenopathy by CT size criteria. REPRODUCTIVE: Status post hysterectomy. OTHER: No intraperitoneal free fluid or free air. MUSCULOSKELETAL: Nonacute. Osteopenia. Multiple Tarlov cysts. Old T11 vertebral plana. Old mild-to-moderate L5 compression fracture. Status post L3 and L4 vertebral  body cement augmentation. 17 mm partially calcified LEFT breast mass. Severe degenerative change of the RIGHT hip. IMPRESSION: 1. Mild gastritis with intact antral ulcer. 2. Severe sigmoid diverticulosis, resolved pancolitis. 3. 17 mm partially calcified LEFT breast mass, not reported on prior mammogram. Recommend follow-up mammogram and ultrasound. Aortic Atherosclerosis (ICD10-I70.0). Electronically Signed   By: Elon Alas M.D.   On: 02/14/2017 22:30   Dg Chest Portable 1 View  Result Date: 02/20/2017 CLINICAL DATA:  83 year old female with weakness. Recent fall. Subsequent encounter. EXAM: PORTABLE CHEST 1 VIEW COMPARISON:  02/14/2017. FINDINGS: No infiltrate, congestive heart failure or pneumothorax. Skin fold on the left. Post median sternotomy/ CABG.  Heart size within normal limits. Calcified slightly tortuous aorta. Prior right shoulder surgery. CT detected T11 compression fracture not well delineated by plain film exam. IMPRESSION: No acute pulmonary abnormality. Aortic Atherosclerosis (ICD10-I70.0). Electronically Signed   By: Genia Del M.D.   On: 02/20/2017 12:12   Dg Knee Complete 4 Views Right  Result Date: 02/14/2017 CLINICAL DATA:  Right knee pain after fall 2 days ago. EXAM: RIGHT KNEE - COMPLETE 4+ VIEW COMPARISON:  None. FINDINGS: Femorotibial and patellofemoral joint space narrowing is noted with mild chondrocalcinosis. No acute fracture or joint dislocation. No joint effusion. Surgical clips are seen in the popliteal fossa. IMPRESSION: 1. Mild tricompartmental joint space narrowing of the right knee. 2. No acute fracture nor joint dislocation.  No joint effusion Electronically Signed   By: Ashley Royalty M.D.   On: 02/14/2017 18:44     CBC  Recent Labs Lab 02/14/17 1718 02/20/17 1147 02/21/17 0504  WBC 9.5 15.3* 13.1*  HGB 11.6* 9.0* 7.0*  HCT 33.8* 27.4* 20.9*  PLT 371 450* 334  MCV 89.8 90.5 91.1  MCH 30.9 29.7 30.4  MCHC 34.4 32.9 33.4  RDW 14.3 14.6* 14.5   LYMPHSABS  --  1.4  --   MONOABS  --  0.5  --   EOSABS  --  0.0  --   BASOSABS  --  0.0  --     Chemistries   Recent Labs Lab 02/14/17 1718 02/20/17 1147 02/21/17 0406  NA 134* 140 142  K 4.4 4.5 4.0  CL 100* 109 116*  CO2 23 21* 20*  GLUCOSE 121* 116* 89  BUN 25* 58* 54*  CREATININE 1.19* 0.92 1.08*  CALCIUM 8.9 8.8* 7.9*  MG  --  2.2  --    ------------------------------------------------------------------------------------------------------------------ estimated creatinine clearance is 26 mL/min (A) (by C-G formula based on SCr of 1.08 mg/dL (H)). ------------------------------------------------------------------------------------------------------------------ No results for input(s): HGBA1C in the last 72 hours. ------------------------------------------------------------------------------------------------------------------ No  results for input(s): CHOL, HDL, LDLCALC, TRIG, CHOLHDL, LDLDIRECT in the last 72 hours. ------------------------------------------------------------------------------------------------------------------ No results for input(s): TSH, T4TOTAL, T3FREE, THYROIDAB in the last 72 hours.  Invalid input(s): FREET3 ------------------------------------------------------------------------------------------------------------------  Recent Labs  02/21/17 0406  FOLATE 24.0  FERRITIN 83  TIBC 205*  IRON 37  RETICCTPCT 3.0    Coagulation profile No results for input(s): INR, PROTIME in the last 168 hours.  No results for input(s): DDIMER in the last 72 hours.  Cardiac Enzymes  Recent Labs Lab 02/20/17 1147  TROPONINI <0.03   ------------------------------------------------------------------------------------------------------------------ Invalid input(s): POCBNP    Assessment & Plan   83 year old female with past medical history of hypertension, hyperlipidemia, orthostatic hypotension, GERD, coronary artery disease, breast cancer,  previous history of syncope who presents to the hospital due to weakness, frequent falls and poor p.o. intake.  1. Symptomatic anemia We'll guaiac stools Get anemia studies I have discussed with patient and her daughter regarding transfusion she'll receive 1 unit of packed RBC   2. Dehydration-this is secondary to patient's poor p.o. intake. Discontinue IV fluids  3.  Adult failure to thrive/poor p.o. intake-  Patient on Marinol, I did a CT of the chest and abdomen to rule out any type of malignancy which is negative Patient appears to be very depressed psychiatry consult  4.  Recent urinary tract infection- status post treatment.  5.  Hypothyroidism-continue Synthroid.  Next  6.  History of orthostatic hypotension- continue Florinef.  7.  Depression-continue Celexa, Elavil.  8.  Chronic back pain-secondary to her recurrent falls.  Continue fentanyl patch.  9.  GERD-continue Protonix.     Code Status Orders        Start     Ordered   02/20/17 1522  Do not attempt resuscitation (DNR)  Continuous    Question Answer Comment  In the event of cardiac or respiratory ARREST Do not call a "code blue"   In the event of cardiac or respiratory ARREST Do not perform Intubation, CPR, defibrillation or ACLS   In the event of cardiac or respiratory ARREST Use medication by any route, position, wound care, and other measures to relive pain and suffering. May use oxygen, suction and manual treatment of airway obstruction as needed for comfort.      02/20/17 1522    Code Status History    Date Active Date Inactive Code Status Order ID Comments User Context   12/21/2016  4:14 PM 12/24/2016  5:20 PM Full Code [id number]  Epifanio Lesches, MD ED   12/21/2016  3:47 PM 12/21/2016  4:14 PM DNR [id number]  Epifanio Lesches, MD ED   12/21/2016  3:46 PM 12/21/2016  3:47 PM Full Code [id number]  Epifanio Lesches, MD ED   10/01/2015  5:34 PM 10/02/2015  6:18 PM DNR [id number]  Loletha Grayer, MD ED           Consults  pychiatry  DVT Prophylaxis  scd's  Lab Results  Component Value Date   PLT 334 02/21/2017     Time Spent in minutes   55min Greater than 50% of time spent in care coordination and counseling patient regarding the condition and plan of care.   Dustin Flock M.D on 02/21/2017 at 1:59 PM  Between 7am to 6pm - Pager - [phone]  After 6pm go to [email or web address] - password [email or web address] [facility]   Office  [phone]

## 2017-02-21 NOTE — BH Assessment (Signed)
Dr.Clapacs aware of Psych MD consult.

## 2017-02-21 NOTE — Progress Notes (Signed)
Blood transfusion started at 1400. IV infiltrated in left forearm around 1500. Switched to IV in right [facility] and started leaking. New IV insertion x3 without success. STAT IV team consult- notified urgency of consult due to blood transfusion. IV RN stated she's currently in ED but will come as soon as she can. Nursing supervisor aware.

## 2017-02-21 NOTE — Progress Notes (Signed)
Blood transfusion restarted 1600

## 2017-02-22 ENCOUNTER — Ambulatory Visit: Payer: Medicare Other | Admitting: Internal Medicine

## 2017-02-22 DIAGNOSIS — F05 Delirium due to known physiological condition: Secondary | ICD-10-CM

## 2017-02-22 LAB — BASIC METABOLIC PANEL
ANION GAP: 8 (ref 5–15)
BUN: 34 mg/dL — ABNORMAL HIGH (ref 6–20)
CALCIUM: 8.2 mg/dL — AB (ref 8.9–10.3)
CO2: 21 mmol/L — ABNORMAL LOW (ref 22–32)
Chloride: 113 mmol/L — ABNORMAL HIGH (ref 101–111)
Creatinine, Ser: 1.02 mg/dL — ABNORMAL HIGH (ref 0.44–1.00)
GFR, EST AFRICAN AMERICAN: 55 mL/min — AB (ref >60)
GFR, EST NON AFRICAN AMERICAN: 47 mL/min — AB (ref >60)
GLUCOSE: 98 mg/dL (ref 65–99)
Potassium: 3.8 mmol/L (ref 3.5–5.1)
SODIUM: 142 mmol/L (ref 135–145)

## 2017-02-22 LAB — CBC
HCT: 27.3 % — ABNORMAL LOW (ref 35.0–47.0)
Hemoglobin: 9.1 g/dL — ABNORMAL LOW (ref 12.0–16.0)
MCH: 31.3 pg (ref 26.0–34.0)
MCHC: 33.3 g/dL (ref 32.0–36.0)
MCV: 93.8 fL (ref 80.0–100.0)
PLATELETS: 331 10*3/uL (ref 150–440)
RBC: 2.91 MIL/uL — ABNORMAL LOW (ref 3.80–5.20)
RDW: 16.1 % — AB (ref 11.5–14.5)
WBC: 11.2 10*3/uL — AB (ref 3.6–11.0)

## 2017-02-22 LAB — TYPE AND SCREEN
ABO/RH(D): A POS
ANTIBODY SCREEN: NEGATIVE
UNIT DIVISION: 0

## 2017-02-22 LAB — BPAM RBC
BLOOD PRODUCT EXPIRATION DATE: 201811102359
ISSUE DATE / TIME: 201810241356
Unit Type and Rh: 6200

## 2017-02-22 NOTE — Evaluation (Signed)
Physical Therapy Evaluation Patient Details Name: Angela Howe MRN: [id number] DOB: Nov 23, 1925 Today's Date: 02/22/2017   History of Present Illness     Clinical Impression  Pt is an 83 year old female admitted for deliruim and dehydration.  Pt presented with lethargy and stated that she did not want to get out of bed upon PT arrival.  She eventually agreed to demonstrate bed mobility which required very minimal assistance for pt to sit upright.  Pt fell asleep 3x during evaluation, repeating that she "just wanted to go to bed".  Pt demonstrated mild weakness of UE and LE but is capable of performing STS transfer and managing RW with need for only supervision due to weakness and balance deficits.  Pt stated that her back and R hip are experiencing pain at an 8/10 and that this is a chronic issue.  She was able to ambulate 45 ft with no rest breaks and no sign of increased pain.  Pt will continue to benefit from skilled PT with focus on strength, tolerance to activity and balance.    Follow Up Recommendations SNF    Equipment Recommendations       Recommendations for Other Services       Precautions / Restrictions Restrictions Weight Bearing Restrictions: No      Mobility  Bed Mobility                  Transfers                    Ambulation/Gait                Stairs            Wheelchair Mobility    Modified Rankin (Stroke Patients Only)       Balance                                             Pertinent Vitals/Pain      Home Living                        Prior Function                 Hand Dominance        Extremity/Trunk Assessment                Communication      Cognition                                              General Comments      Exercises Other Exercises Other Exercises: SLR, BLE x5 in supine   Assessment/Plan    PT Assessment Patient  needs continued PT services  PT Problem List Decreased strength;Decreased activity tolerance;Decreased balance;Pain       PT Treatment Interventions DME instruction;Gait training;Functional mobility training;Therapeutic activities;Therapeutic exercise;Balance training;Patient/family education    PT Goals (Current goals can be found in the Care Plan section)       Frequency Min 2X/week   Barriers to discharge Other (comment) (Pt expresses disinterest in mobility and states that she does not want to get out of bed.)      Co-evaluation  AM-PAC PT "6 Clicks" Daily Activity  Outcome Measure Difficulty turning over in bed (including adjusting bedclothes, sheets and blankets)?: A Little Difficulty moving from lying on back to sitting on the side of the bed? : A Little Difficulty sitting down on and standing up from a chair with arms (e.g., wheelchair, bedside commode, etc,.)?: A Little Help needed moving to and from a bed to chair (including a wheelchair)?: A Little Help needed walking in hospital room?: A Little Help needed climbing 3-5 steps with a railing? : A Lot 6 Click Score: 17    End of Session Equipment Utilized During Treatment: Gait belt Activity Tolerance: Treatment limited secondary to agitation Patient left: in bed;with call bell/phone within reach;with family/visitor present;with bed alarm set Nurse Communication: Mobility status PT Visit Diagnosis: Unsteadiness on feet (R26.81);Muscle weakness (generalized) (M62.81);History of falling (Z91.81);Pain Pain - Right/Left: Right Pain - part of body: Hip    Time: 1027-1100 PT Time Calculation (min) (ACUTE ONLY): 33 min   Charges:   PT Evaluation $PT Eval Moderate Complexity: 1 Mod     PT G Codes:   PT G-Codes **NOT FOR INPATIENT CLASS** Functional Assessment Tool Used: AM-PAC 6 Clicks Basic Mobility Functional Limitation: Mobility: Walking and moving around;Changing and maintaining body  position Mobility: Walking and Moving Around Current Status [phone]): At least 80 percent but less than 100 percent impaired, limited or restricted Mobility: Walking and Moving Around Goal Status [phone]): At least 1 percent but less than 20 percent impaired, limited or restricted  Roxanne Gates, PT, DPT  Roxanne Gates 02/22/2017, 3:01 PM

## 2017-02-22 NOTE — Plan of Care (Signed)
Problem: Education: Goal: Knowledge of [facility] Education information/materials will improve Outcome: Progressing VSS, free of falls during shift.  Denies pain, no complaints overnight.  Confused, impulsive at times during shift, easily redirected.  Bed in low position, bed alarm on.  Call bell within reach, [place].

## 2017-02-22 NOTE — Progress Notes (Signed)
[place] at [facility]                                                                                                                                                                                  Patient Demographics   Akeylah Hendel, is a 83 y.o. female, DOB - [birth year], WFU:[id number]  Admit date - 02/20/2017   Admitting Physician Henreitta Leber, MD  Outpatient Primary MD for the patient is Einar Pheasant, MD   LOS - 2  Subjective: Patient states that she wants to go home   Review of Systems:   CONSTITUTIONAL: No documented fever. No fatigue,positive weakness. No weight gain, no weight loss.  EYES: No blurry or double vision.  ENT: No tinnitus. No postnasal drip. No redness of the oropharynx.  RESPIRATORY: No cough, no wheeze, no hemoptysis. No dyspnea.  CARDIOVASCULAR: No chest pain. No orthopnea. No palpitations. No syncope.  GASTROINTESTINAL: No nausea, no vomiting or diarrhea. No abdominal pain. No melena or hematochezia.  GENITOURINARY: No dysuria or hematuria.  ENDOCRINE: No polyuria or nocturia. No heat or cold intolerance.  HEMATOLOGY: No anemia. No bruising. No bleeding.  INTEGUMENTARY: No rashes. No lesions.  MUSCULOSKELETAL: No arthritis. No swelling. No gout.  NEUROLOGIC: No numbness, tingling, or ataxia. No seizure-type activity.  PSYCHIATRIC: No anxiety. No insomnia. No ADD.    Vitals:   Vitals:   02/21/17 1815 02/21/17 1959 02/22/17 0347 02/22/17 0810  BP: (!) 126/50 (!) 131/45 132/76 (!) 140/99  Pulse:  87 81 83  Resp: 18 20 20 18   Temp: 98.3 F (36.8 C) 98 F (36.7 C) 98.4 F (36.9 C) 97.6 F (36.4 C)  TempSrc: Oral  Oral Oral  SpO2: 98% 100% 100% 99%  Weight:      Height:        Wt Readings from Last 3 Encounters:  02/20/17 103 lb (46.7 kg)  02/14/17 104 lb (47.2 kg)  02/12/17 110 lb (49.9 kg)     Intake/Output Summary (Last 24 hours) at 02/22/17 1309 Last data filed at 02/22/17 1018  Gross per 24 hour   Intake              380 ml  Output                0 ml  Net              380 ml    Physical Exam:   GENERAL: Pleasant-appearing in no apparent distress.  HEAD, EYES, EARS, NOSE AND THROAT: Atraumatic, normocephalic. Extraocular muscles are intact. Pupils equal and reactive to light. Sclerae anicteric. No conjunctival injection. No oro-pharyngeal erythema.  NECK: Supple. There is  no jugular venous distention. No bruits, no lymphadenopathy, no thyromegaly.  HEART: Regular rate and rhythm,. No murmurs, no rubs, no clicks.  LUNGS: Clear to auscultation bilaterally. No rales or rhonchi. No wheezes.  ABDOMEN: Soft, flat, nontender, nondistended. Has good bowel sounds. No hepatosplenomegaly appreciated.  EXTREMITIES: No evidence of any cyanosis, clubbing, or peripheral edema.  +2 pedal and radial pulses bilaterally.  NEUROLOGIC: The patient is alert, awake,  with no focal motor or sensory deficits appreciated bilaterally.  SKIN: Moist and warm with no rashes appreciated.  Psych: Not anxious, depressed LN: No inguinal LN enlargement    Antibiotics   Anti-infectives    Start     Dose/Rate Route Frequency Ordered Stop   02/20/17 1530  cephALEXin (KEFLEX) capsule 500 mg     500 mg Oral Every 12 hours 02/20/17 1522 02/21/17 2359      Medications   Scheduled Meds: . acetaminophen  650 mg Oral Q8H  . amitriptyline  25 mg Oral QHS  . aspirin EC  81 mg Oral Daily  . atorvastatin  20 mg Oral Daily  . citalopram  20 mg Oral Daily  . docusate  50 mg Oral BID  . dronabinol  2.5 mg Oral BID AC  . fentaNYL  12.5 mcg Transdermal Q72H  . fludrocortisone  0.1 mg Oral Daily  . levothyroxine  50 mcg Oral Daily  . lidocaine  2 patch Transdermal Q24H  . multivitamin with minerals  1 tablet Oral Daily  . pantoprazole  40 mg Oral Daily   Continuous Infusions: PRN Meds:.acetaminophen **OR** acetaminophen, ketorolac, nitroGLYCERIN, ondansetron **OR** ondansetron (ZOFRAN) IV   Data Review:    Micro Results Recent Results (from the past 240 hour(s))  Urine Culture     Status: Abnormal   Collection Time: 02/14/17  5:19 PM  Result Value Ref Range Status   Specimen Description URINE, RANDOM  Final   Special Requests NONE  Final   Culture >=100,000 COLONIES/mL ESCHERICHIA COLI (A)  Final   Report Status 02/17/2017 FINAL  Final   Organism ID, Bacteria ESCHERICHIA COLI (A)  Final      Susceptibility   Escherichia coli - MIC*    AMPICILLIN 8 SENSITIVE Sensitive     CEFAZOLIN <=4 SENSITIVE Sensitive     CEFTRIAXONE <=1 SENSITIVE Sensitive     CIPROFLOXACIN <=0.25 SENSITIVE Sensitive     GENTAMICIN <=1 SENSITIVE Sensitive     IMIPENEM <=0.25 SENSITIVE Sensitive     NITROFURANTOIN <=16 SENSITIVE Sensitive     TRIMETH/SULFA <=20 SENSITIVE Sensitive     AMPICILLIN/SULBACTAM 4 SENSITIVE Sensitive     PIP/TAZO <=4 SENSITIVE Sensitive     Extended ESBL NEGATIVE Sensitive     * >=100,000 COLONIES/mL ESCHERICHIA COLI  Urine Culture     Status: None   Collection Time: 02/20/17 11:56 AM  Result Value Ref Range Status   Specimen Description URINE, CATHETERIZED  Final   Special Requests Normal  Final   Culture   Final    NO GROWTH Performed at [facility], [address] [place], [place] [address]    Report Status 02/21/2017 FINAL  Final    Radiology Reports Dg Ribs Unilateral W/chest Left  Result Date: 02/14/2017 CLINICAL DATA:  Recent fall.  Chest pain EXAM: LEFT RIBS AND CHEST - 3+ VIEW COMPARISON:  10/12/2015 FINDINGS: CABG. Negative for heart failure. Lungs are clear without infiltrate effusion or pneumothorax Negative for left rib fracture. IMPRESSION: Negative. Electronically Signed   By: Franchot Gallo M.D.  On: 02/14/2017 18:43   Dg Thoracic Spine 2 View  Result Date: 02/12/2017 CLINICAL DATA:  Multiple falls most recently this morning. Patient reports upper and lower back pain. EXAM: THORACIC SPINE 2 VIEWS COMPARISON:  Thoracic spine series of January 07, 2016 FINDINGS: The patient has undergone previous kyphoplasty in the lower lumbar spine. There are compression fractures of T8, T9, and T11 which are stable. There is mild anterior wedging of the bodies of T5 and T2. The T2 compression appears new. There are no abnormal paravertebral soft tissue densities. The pedicles appear intact. IMPRESSION: Stable compressions of T5, T8, T9, and T11. The latter 3 are high-grade. There is new mild anterior compression of the body of T10. Electronically Signed   By: David  Martinique M.D.   On: 02/12/2017 12:51   Dg Lumbar Spine 2-3 Views  Result Date: 02/12/2017 CLINICAL DATA:  Multiple falls, most recent this morning. Back pain. EXAM: LUMBAR SPINE - 2-3 VIEW COMPARISON:  12/21/2016 CT abdomen/ pelvis. FINDINGS: This report assumes 5 non rib-bearing lumbar vertebrae. Diffuse osteopenia. Severe T8 vertebral compression fracture, chronic, with interval height loss since 10/12/2015 chest CT. Severe T9 vertebral compression fracture, chronic, with interval height loss since 10/12/2015 chest CT. Severe T11 vertebral compression fracture, chronic, stable since 10/12/2015 chest CT. Moderate L3 vertebral compression fracture status post vertebroplasty, stable since 12/21/2016 CT abdomen/pelvis study. Moderate to severe L4 vertebral compression fracture, stable since 12/21/2016 CT. Remaining visualized thoracolumbar vertebral body heights are preserved with no acute fracture. Moderate to severe multilevel degenerative disc disease in the visualized thoracolumbar spine, most prominent at L2-3 and L3-4, not appreciably changed. No spondylolisthesis. Mild facet arthropathy bilaterally in the lower lumbar spine. No aggressive appearing focal osseous lesions. Abdominal aortic atherosclerosis. Visualized lower sternotomy wires appear intact. Cholecystectomy clips are seen in the right upper quadrant of the abdomen. IMPRESSION: 1. No acute vertebral compression fracture in the visualized  thoracolumbar spine . 2. Multiple chronic vertebral compression fractures in the visualized thoracolumbar spine, some of which demonstrate interval worsening loss of vertebral body height as detailed. 3. No spondylolisthesis. 4. Moderate to severe multilevel degenerative disc disease. Electronically Signed   By: Ilona Sorrel M.D.   On: 02/12/2017 12:53   Ct Head Wo Contrast  Result Date: 02/12/2017 CLINICAL DATA:  Mechanical fall this morning. Head injury on door knob. Initial encounter. EXAM: CT HEAD WITHOUT CONTRAST TECHNIQUE: Contiguous axial images were obtained from the base of the skull through the vertex without intravenous contrast. COMPARISON:  10/01/2015 FINDINGS: Brain: No evidence of acute infarction, hemorrhage, hydrocephalus, extra-axial collection or mass lesion/mass effect. Generalized atrophy and confluent chronic small vessel ischemia in the periventricular white matter. Remote lacunar infarct in the left caudate head. Vascular: Atherosclerotic calcification.  No hyperdense vessel. Skull: Left parietal scalp laceration without fracture. Sinuses/Orbits: No evidence of injury. Bilateral cataract resection. IMPRESSION: 1. No evidence of intracranial injury. 2. Left parietal scalp laceration without fracture. Electronically Signed   By: Monte Fantasia M.D.   On: 02/12/2017 12:59   Ct Chest W Contrast  Result Date: 02/21/2017 CLINICAL DATA:  None localized abdominal pain and unintended weight loss. Adult failure to thrive. Personal history of breast carcinoma. EXAM: CT CHEST, ABDOMEN, AND PELVIS WITH CONTRAST TECHNIQUE: Multidetector CT imaging of the chest, abdomen and pelvis was performed following the standard protocol during bolus administration of intravenous contrast. CONTRAST:  65mL ISOVUE-300 IOPAMIDOL (ISOVUE-300) INJECTION 61% COMPARISON:  Chest CT on 10/12/2015 and AP CT on 02/14/2017 FINDINGS: CT CHEST FINDINGS Cardiovascular:  No acute findings. Aortic and coronary artery  atherosclerosis. Prior CABG. Mediastinum/Lymph Nodes: No masses or pathologically enlarged lymph nodes identified. Stable post lumpectomy changes in left breast. Lungs/Pleura: No pulmonary infiltrate or mass identified. Mild bibasilar scarring. No effusion present. Musculoskeletal: Stable compression fractures of T6, T8, T9, and T11 vertebral bodies. CT ABDOMEN AND PELVIS FINDINGS Hepatobiliary: No masses identified. Prior cholecystectomy. No evidence of biliary obstruction. Pancreas:  No mass or inflammatory changes. Spleen:  Within normal limits in size and appearance. Adrenals/Urinary tract: No masses or hydronephrosis. Stable small left renal cyst. Stomach/Bowel: Stable small hiatal hernia. Stable gastric antrum wall thickening with gas containing ulcer in the superior wall. No evidence free intraperitoneal air. Sigmoid diverticulosis, without evidence of diverticulitis . No evidence of bowel obstruction. Vascular/Lymphatic: No pathologically enlarged lymph nodes identified. No abdominal aortic aneurysm. Aortic atherosclerosis. Reproductive: Prior hysterectomy noted. Adnexal regions are unremarkable in appearance. Other:  None. Musculoskeletal: No suspicious bone lesions identified. Previous L3 and L4 vertebroplasties. IMPRESSION: No significant change in appearance of gastric antral ulcer. No evidence of perforation or other complication. Stable small hiatal hernia. Colonic diverticulosis, without radiographic evidence of diverticulitis. Aortic and coronary artery atherosclerosis. Old thoracic and lumbar vertebral compression fracture deformities. Previous L3 and L4 vertebroplasties. Electronically Signed   By: Earle Gell M.D.   On: 02/21/2017 12:53   Ct Abdomen Pelvis W Contrast  Result Date: 02/21/2017 CLINICAL DATA:  None localized abdominal pain and unintended weight loss. Adult failure to thrive. Personal history of breast carcinoma. EXAM: CT CHEST, ABDOMEN, AND PELVIS WITH CONTRAST TECHNIQUE:  Multidetector CT imaging of the chest, abdomen and pelvis was performed following the standard protocol during bolus administration of intravenous contrast. CONTRAST:  33mL ISOVUE-300 IOPAMIDOL (ISOVUE-300) INJECTION 61% COMPARISON:  Chest CT on 10/12/2015 and AP CT on 02/14/2017 FINDINGS: CT CHEST FINDINGS Cardiovascular: No acute findings. Aortic and coronary artery atherosclerosis. Prior CABG. Mediastinum/Lymph Nodes: No masses or pathologically enlarged lymph nodes identified. Stable post lumpectomy changes in left breast. Lungs/Pleura: No pulmonary infiltrate or mass identified. Mild bibasilar scarring. No effusion present. Musculoskeletal: Stable compression fractures of T6, T8, T9, and T11 vertebral bodies. CT ABDOMEN AND PELVIS FINDINGS Hepatobiliary: No masses identified. Prior cholecystectomy. No evidence of biliary obstruction. Pancreas:  No mass or inflammatory changes. Spleen:  Within normal limits in size and appearance. Adrenals/Urinary tract: No masses or hydronephrosis. Stable small left renal cyst. Stomach/Bowel: Stable small hiatal hernia. Stable gastric antrum wall thickening with gas containing ulcer in the superior wall. No evidence free intraperitoneal air. Sigmoid diverticulosis, without evidence of diverticulitis . No evidence of bowel obstruction. Vascular/Lymphatic: No pathologically enlarged lymph nodes identified. No abdominal aortic aneurysm. Aortic atherosclerosis. Reproductive: Prior hysterectomy noted. Adnexal regions are unremarkable in appearance. Other:  None. Musculoskeletal: No suspicious bone lesions identified. Previous L3 and L4 vertebroplasties. IMPRESSION: No significant change in appearance of gastric antral ulcer. No evidence of perforation or other complication. Stable small hiatal hernia. Colonic diverticulosis, without radiographic evidence of diverticulitis. Aortic and coronary artery atherosclerosis. Old thoracic and lumbar vertebral compression fracture deformities.  Previous L3 and L4 vertebroplasties. Electronically Signed   By: Earle Gell M.D.   On: 02/21/2017 12:53   Ct Abdomen Pelvis W Contrast  Result Date: 02/14/2017 CLINICAL DATA:  Recurrent falls. Known compression fracture. LEFT flank pain. History of breast cancer, peptic ulcer disease, appendectomy, hysterectomy and cholecystectomy. EXAM: CT ABDOMEN AND PELVIS WITH CONTRAST TECHNIQUE: Multidetector CT imaging of the abdomen and pelvis was performed using the standard protocol following bolus administration  of intravenous contrast. CONTRAST:  35mL ISOVUE-300 IOPAMIDOL (ISOVUE-300) INJECTION 61% COMPARISON:  CT abdomen and pelvis December 21, 2016 and lumbar spine radiographs February 12, 2017. Mammogram report January 25, 2016. FINDINGS: LOWER CHEST: Lung bases are clear. Included heart size is normal. Mitral annular calcifications. No pericardial effusion. Status post median sternotomy. HEPATOBILIARY: Scattered hepatic granulomas, liver is otherwise unremarkable. Status post cholecystectomy. PANCREAS: Atrophic, nonacute. SPLEEN: Normal. ADRENALS/URINARY TRACT: Kidneys are orthotopic, demonstrating symmetric enhancement. No nephrolithiasis, hydronephrosis or solid renal masses. Mild LEFT pelvicaliectasis. Multiple homogeneously hypodense benign-appearing LEFT renal cysts measuring to 2.5 cm. The unopacified ureters are normal in course and caliber. Delayed imaging through the kidneys demonstrates symmetric prompt contrast excretion within the proximal urinary collecting system. Urinary bladder is well distended and unremarkable. Normal adrenal glands. STOMACH/BOWEL: Small hiatal hernia. Severe sigmoid diverticulosis. No significant residual colonic bowel wall thickening though sensitivity decreased without oral contrast. Thickened, edematous gastric antrum on proximal duodenum. 1.2 cm gastric antral ulcer superior wall. VASCULAR/LYMPHATIC: Aortoiliac vessels are normal in course and caliber. Moderate to severe  calcific atherosclerosis . No lymphadenopathy by CT size criteria. REPRODUCTIVE: Status post hysterectomy. OTHER: No intraperitoneal free fluid or free air. MUSCULOSKELETAL: Nonacute. Osteopenia. Multiple Tarlov cysts. Old T11 vertebral plana. Old mild-to-moderate L5 compression fracture. Status post L3 and L4 vertebral body cement augmentation. 17 mm partially calcified LEFT breast mass. Severe degenerative change of the RIGHT hip. IMPRESSION: 1. Mild gastritis with intact antral ulcer. 2. Severe sigmoid diverticulosis, resolved pancolitis. 3. 17 mm partially calcified LEFT breast mass, not reported on prior mammogram. Recommend follow-up mammogram and ultrasound. Aortic Atherosclerosis (ICD10-I70.0). Electronically Signed   By: Elon Alas M.D.   On: 02/14/2017 22:30   Dg Chest Portable 1 View  Result Date: 02/20/2017 CLINICAL DATA:  83 year old female with weakness. Recent fall. Subsequent encounter. EXAM: PORTABLE CHEST 1 VIEW COMPARISON:  02/14/2017. FINDINGS: No infiltrate, congestive heart failure or pneumothorax. Skin fold on the left. Post median sternotomy/ CABG.  Heart size within normal limits. Calcified slightly tortuous aorta. Prior right shoulder surgery. CT detected T11 compression fracture not well delineated by plain film exam. IMPRESSION: No acute pulmonary abnormality. Aortic Atherosclerosis (ICD10-I70.0). Electronically Signed   By: Genia Del M.D.   On: 02/20/2017 12:12   Dg Knee Complete 4 Views Right  Result Date: 02/14/2017 CLINICAL DATA:  Right knee pain after fall 2 days ago. EXAM: RIGHT KNEE - COMPLETE 4+ VIEW COMPARISON:  None. FINDINGS: Femorotibial and patellofemoral joint space narrowing is noted with mild chondrocalcinosis. No acute fracture or joint dislocation. No joint effusion. Surgical clips are seen in the popliteal fossa. IMPRESSION: 1. Mild tricompartmental joint space narrowing of the right knee. 2. No acute fracture nor joint dislocation.  No joint  effusion Electronically Signed   By: Ashley Royalty M.D.   On: 02/14/2017 18:44     CBC  Recent Labs Lab 02/20/17 1147 02/21/17 0504 02/22/17 0416  WBC 15.3* 13.1* 11.2*  HGB 9.0* 7.0* 9.1*  HCT 27.4* 20.9* 27.3*  PLT 450* 334 331  MCV 90.5 91.1 93.8  MCH 29.7 30.4 31.3  MCHC 32.9 33.4 33.3  RDW 14.6* 14.5 16.1*  LYMPHSABS 1.4  --   --   MONOABS 0.5  --   --   EOSABS 0.0  --   --   BASOSABS 0.0  --   --     Chemistries   Recent Labs Lab 02/20/17 1147 02/21/17 0406 02/22/17 0416  NA 140 142 142  K 4.5 4.0 3.8  CL 109 116* 113*  CO2 21* 20* 21*  GLUCOSE 116* 89 98  BUN 58* 54* 34*  CREATININE 0.92 1.08* 1.02*  CALCIUM 8.8* 7.9* 8.2*  MG 2.2  --   --    ------------------------------------------------------------------------------------------------------------------ estimated creatinine clearance is 27.6 mL/min (A) (by C-G formula based on SCr of 1.02 mg/dL (H)). ------------------------------------------------------------------------------------------------------------------ No results for input(s): HGBA1C in the last 72 hours. ------------------------------------------------------------------------------------------------------------------ No results for input(s): CHOL, HDL, LDLCALC, TRIG, CHOLHDL, LDLDIRECT in the last 72 hours. ------------------------------------------------------------------------------------------------------------------ No results for input(s): TSH, T4TOTAL, T3FREE, THYROIDAB in the last 72 hours.  Invalid input(s): FREET3 ------------------------------------------------------------------------------------------------------------------  Recent Labs  02/21/17 0406 02/21/17 0950  VITAMINB12  --  654  FOLATE 24.0  --   FERRITIN 83  --   TIBC 205*  --   IRON 37  --   RETICCTPCT 3.0  --     Coagulation profile No results for input(s): INR, PROTIME in the last 168 hours.  No results for input(s): DDIMER in the last 72 hours.  Cardiac  Enzymes  Recent Labs Lab 02/20/17 1147  TROPONINI <0.03   ------------------------------------------------------------------------------------------------------------------ Invalid input(s): POCBNP    Assessment & Plan   83 year old female with past medical history of hypertension, hyperlipidemia, orthostatic hypotension, GERD, coronary artery disease, breast cancer, previous history of syncope who presents to the hospital due to weakness, frequent falls and poor p.o. intake.  1. Symptomatic anemia S/p transfusion hemoglobin stable  2. Dehydration-this is secondary to patient's poor p.o. intake. Iv stopped  3.  Adult failure to thrive/poor p.o. intake-  Patient on Marinol, I did a CT of the chest and abdomen to rule out any type of malignancy which is negative pychaitry saw pt not depressed  4.  Recent urinary tract infection- status post treatment.  5.  Hypothyroidism-continue Synthroid.  Next  6.  History of orthostatic hypotension- continue Florinef.  7.  Depression-continue Celexa, Elavil.  8.  Chronic back pain-secondary to her recurrent falls.  Continue fentanyl patch.  9.  GERD-continue Protonix.     Code Status Orders        Start     Ordered   02/20/17 1522  Do not attempt resuscitation (DNR)  Continuous    Question Answer Comment  In the event of cardiac or respiratory ARREST Do not call a "code blue"   In the event of cardiac or respiratory ARREST Do not perform Intubation, CPR, defibrillation or ACLS   In the event of cardiac or respiratory ARREST Use medication by any route, position, wound care, and other measures to relive pain and suffering. May use oxygen, suction and manual treatment of airway obstruction as needed for comfort.      02/20/17 1522    Code Status History    Date Active Date Inactive Code Status Order ID Comments User Context   12/21/2016  4:14 PM 12/24/2016  5:20 PM Full Code [id number]  Epifanio Lesches, MD ED    12/21/2016  3:47 PM 12/21/2016  4:14 PM DNR [id number]  Epifanio Lesches, MD ED   12/21/2016  3:46 PM 12/21/2016  3:47 PM Full Code [id number]  Epifanio Lesches, MD ED   10/01/2015  5:34 PM 10/02/2015  6:18 PM DNR [id number]  Loletha Grayer, MD ED           Consults  pychiatry  DVT Prophylaxis  scd's  Lab Results  Component Value Date   PLT 331 02/22/2017     Time Spent in minutes   30min Greater than 50% of time  spent in care coordination and counseling patient regarding the condition and plan of care.   Dustin Flock M.D on 02/22/2017 at 1:09 PM  Between 7am to 6pm - Pager - [phone]  After 6pm go to [email or web address] - password [email or web address] [facility]   Office  [phone]

## 2017-02-22 NOTE — [place] FL2 (Signed)
[place] LEVEL OF CARE SCREENING TOOL     IDENTIFICATION  Patient Name: Angela Howe Birthdate: 10-20-1925 Sex: female Admission Date (Current Location): 02/20/2017  [place] and [place] Number:  Engineering geologist and Address:  [facility], [address], [place], [place] [address]      Provider Number: [id number]  Attending Physician Name and Address:  Dustin Flock, MD  Relative Name and Phone Number:       Current Level of Care: Hospital Recommended Level of Care: [place] Prior Approval Number:    Date Approved/Denied:   PASRR Number:  ([id number] )  Discharge Plan: SNF    Current Diagnoses: Patient Active Problem List   Diagnosis Date Noted  . Subacute delirium 02/21/2017  . Palliative care by specialist   . Advance care planning   . Goals of care, counseling/discussion   . Dehydration 02/20/2017  . Pancolitis ([place]) 12/21/2016  . Unsteady gait 08/13/2016  . Falls frequently 07/04/2016  . Chest pain 10/12/2015  . Rib fracture 10/03/2015  . Right carotid bruit 08/01/2015  . Loss of weight 01/12/2015  . Anemia 07/18/2014  . Health care maintenance 07/18/2014  . Hoarseness 07/18/2014  . Abdominal pain 05/05/2014  . Back pain 10/05/2013  . Knee pain 07/14/2013  . Oral pain 05/06/2013  . Itching 01/05/2013  . History of breast cancer 11/20/2012  . Syncope 11/20/2012  . Hypothyroidism 08/10/2012  . Difficulty sleeping 08/10/2012  . Dizziness 11/08/2010  . Hyperlipemia 03/23/2009  . HYPERTENSION, BENIGN 03/23/2009  . CAD, NATIVE VESSEL 09/24/2008  . ORTHOSTATIC HYPOTENSION 09/24/2008  . ABNORMAL EKG 09/24/2008    Orientation RESPIRATION BLADDER Height & Weight     Self  Normal Continent Weight: 103 lb (46.7 kg) Height:  5\' 4"  (162.6 cm)  BEHAVIORAL SYMPTOMS/MOOD NEUROLOGICAL BOWEL NUTRITION STATUS      Continent Diet (Regular Diet )  AMBULATORY STATUS COMMUNICATION OF NEEDS Skin    Extensive Assist Verbally Normal                       Personal Care Assistance Level of Assistance  Bathing, Feeding, Dressing Bathing Assistance: Limited assistance Feeding assistance: Independent Dressing Assistance: Limited assistance     Functional Limitations Info  Sight, Hearing, Speech Sight Info: Adequate Hearing Info: Adequate Speech Info: Adequate    SPECIAL CARE FACTORS FREQUENCY  PT (By licensed PT), OT (By licensed OT)     PT Frequency:  (5) OT Frequency:  (5)            Contractures      Additional Factors Info  Code Status, Allergies Code Status Info:  (DNR ) Allergies Info:  (Alendronate Sodium, Prednisone)           Current Medications (02/22/2017):  This is the current hospital active medication list Current Facility-Administered Medications  Medication Dose Route Frequency Provider Last Rate Last Dose  . acetaminophen (TYLENOL) tablet 650 mg  650 mg Oral Q6H PRN Henreitta Leber, MD   650 mg at 02/20/17 1603   Or  . acetaminophen (TYLENOL) suppository 650 mg  650 mg Rectal Q6H PRN Henreitta Leber, MD      . acetaminophen (TYLENOL) tablet 650 mg  650 mg Oral Q8H Mahan, Wayna Chalet, NP   650 mg at 02/22/17 1440  . amitriptyline (ELAVIL) tablet 25 mg  25 mg Oral QHS Clapacs, Madie Reno, MD   25 mg at 02/21/17 2142  . aspirin EC  tablet 81 mg  81 mg Oral Daily Henreitta Leber, MD   81 mg at 02/22/17 1125  . atorvastatin (LIPITOR) tablet 20 mg  20 mg Oral Daily Henreitta Leber, MD   20 mg at 02/22/17 1126  . citalopram (CELEXA) tablet 20 mg  20 mg Oral Daily Henreitta Leber, MD   20 mg at 02/22/17 1124  . docusate (COLACE) 50 MG/5ML liquid 50 mg  50 mg Oral BID Dustin Flock, MD   50 mg at 02/21/17 2142  . dronabinol (MARINOL) capsule 2.5 mg  2.5 mg Oral BID AC Henreitta Leber, MD   2.5 mg at 02/22/17 1201  . fentaNYL (DURAGESIC - dosed mcg/hr) 12.5 mcg  12.5 mcg Transdermal Q72H Henreitta Leber, MD   12.5 mcg at 02/20/17 1603  .  fludrocortisone (FLORINEF) tablet 0.1 mg  0.1 mg Oral Daily Henreitta Leber, MD   0.1 mg at 02/22/17 1124  . ketorolac (TORADOL) 15 MG/ML injection 15 mg  15 mg Intravenous Q6H PRN Henreitta Leber, MD   15 mg at 02/22/17 1201  . levothyroxine (SYNTHROID, LEVOTHROID) tablet 50 mcg  50 mcg Oral Daily Henreitta Leber, MD   50 mcg at 02/22/17 1124  . lidocaine (LIDODERM) 5 % 2 patch  2 patch Transdermal Q24H Earlie Counts, NP   2 patch at 02/22/17 1440  . multivitamin with minerals tablet 1 tablet  1 tablet Oral Daily Dustin Flock, MD   1 tablet at 02/22/17 1124  . nitroGLYCERIN (NITROSTAT) SL tablet 0.4 mg  0.4 mg Sublingual Q5 min PRN Henreitta Leber, MD      . ondansetron (ZOFRAN) tablet 4 mg  4 mg Oral Q6H PRN Henreitta Leber, MD       Or  . ondansetron (ZOFRAN) injection 4 mg  4 mg Intravenous Q6H PRN Henreitta Leber, MD      . pantoprazole (PROTONIX) EC tablet 40 mg  40 mg Oral Daily Mikaiah, Stoffer, RPH   40 mg at 02/22/17 1126     Discharge Medications: Please see discharge summary for a list of discharge medications.  Relevant Imaging Results:  Relevant Lab Results:   Additional Information  (SSN: [id number])  Angela Howe, Veronia Beets, LCSW

## 2017-02-22 NOTE — Clinical Social Work Note (Addendum)
Clinical Social Work Assessment  Patient Details  Name: Angela Howe MRN: [id number] Date of Birth: 06/08/1925  Date of referral:  02/22/17               Reason for consult:  Discharge Planning, Facility Placement                Permission sought to share information with:  Chartered certified accountant granted to share information::  Yes, Verbal Permission Granted  Name::      [place]::   [place]  Relationship::     Contact Information:     Housing/Transportation Living arrangements for the past 2 months:  [place] of Information:  Adult Children Patient Interpreter Needed:  None Criminal Activity/Legal Involvement Pertinent to Current Situation/Hospitalization:  No - Comment as needed Significant Relationships:  Adult Children Lives with:  Self Do you feel safe going back to the place where you live?  Yes Need for family participation in patient care:  Yes (Comment)  Care giving concerns: Patient lives in alone in [place].    Social Worker assessment / plan: Holiday representative (CSW) received a SNF consult. PT is recommending SNF. Social work Theatre manager attempted to meet with patient but she was confused. Patient stated for social work intern to contact her daughter Angela Howe [phone]). Social work Theatre manager was able to get in contact with [employer]. Social work Theatre manager introduced herself and explained the role of the [place]. Patients daughter shared she is her mothers HPOA and is agreeable with her going to rehab. FL2 complete and faxed out. CSW and social work Theatre manager will continue to follow up and assist.   Employment status:  Retired Nurse, adult PT Recommendations:  [place] / Referral to community resources:  [place]  Patient/Family's Response to care:  Patients daughter is agreeable to bed search.  Patient/Family's Understanding  of and Emotional Response to Diagnosis, Current Treatment, and Prognosis:  Patients daughter was very pleasant and thanked social work Theatre manager for her assistance.  Emotional Assessment Appearance:  Appears stated age Attitude/Demeanor/Rapport:    Affect (typically observed):  Pleasant, Calm Orientation:  Oriented to Self Alcohol / Substance use:  Not Applicable Psych involvement (Current and /or in the community):  No (Comment)  Discharge Needs  Concerns to be addressed:  Care Coordination, Discharge Planning Concerns Readmission within the last 30 days:  No Current discharge risk:  Dependent with Mobility Barriers to Discharge:  Continued Medical Work up   Smith Mince, Student-Social Work 02/22/2017, 11:33 AM

## 2017-02-22 NOTE — Clinical Social Work Placement (Signed)
   CLINICAL SOCIAL WORK PLACEMENT  NOTE  Date:  02/22/2017  Patient Details  Name: Angela Howe MRN: [id number] Date of Birth: 03/21/1926  Clinical Social Work is seeking post-discharge placement for this patient at the [place] level of care (*CSW will initial, date and re-position this form in  chart as items are completed):  Yes   Patient/family provided with [place] Work Department's list of facilities offering this level of care within the geographic area requested by the patient (or if unable, by the patient's family).  Yes   Patient/family informed of their freedom to choose among providers that offer the needed level of care, that participate in Medicare, Medicaid or managed care program needed by the patient, have an available bed and are willing to accept the patient.  Yes   Patient/family informed of [facility]'s ownership interest in [facility] and [facility], as well as of the fact that they are under no obligation to receive care at these facilities.  PASRR submitted to EDS on       PASRR number received on       Existing PASRR number confirmed on 02/22/17     FL2 transmitted to all facilities in geographic area requested by pt/family on 02/22/17     FL2 transmitted to all facilities within larger geographic area on       Patient informed that his/her managed care company has contracts with or will negotiate with certain facilities, including the following:            Patient/family informed of bed offers received.  Patient chooses bed at       Physician recommends and patient chooses bed at      Patient to be transferred to   on  .  Patient to be transferred to facility by       Patient family notified on   of transfer.  Name of family member notified:        PHYSICIAN       Additional Comment:    _______________________________________________ Anikka Marsan, Veronia Beets, LCSW 02/22/2017, 3:47 PM

## 2017-02-22 NOTE — Progress Notes (Signed)
Medications administered by student RN [id number] with supervision of Clinical Instructor Georgia Baria MSN, RN-BC or patient's assigned RN.   

## 2017-02-22 NOTE — Consult Note (Signed)
[facility] Consult   Reason for Consult: Consult for 83 year old woman for "depression" Referring Physician: Posey Pronto Patient Identification: Angela Howe MRN:  [id number] Principal Diagnosis: Subacute delirium Diagnosis:   Patient Active Problem List   Diagnosis Date Noted  . Subacute delirium [F05] 02/21/2017  . Palliative care by specialist [Z51.5]   . Advance care planning [Z71.89]   . Goals of care, counseling/discussion [Z71.89]   . Dehydration [E86.0] 02/20/2017  . Pancolitis ([place]) [K51.00] 12/21/2016  . Unsteady gait [R26.81] 08/13/2016  . Falls frequently [R29.6] 07/04/2016  . Chest pain [R07.9] 10/12/2015  . Rib fracture [S22.39XA] 10/03/2015  . Right carotid bruit [R09.89] 08/01/2015  . Loss of weight [R63.4] 01/12/2015  . Anemia [D64.9] 07/18/2014  . Health care maintenance [Z00.00] 07/18/2014  . Hoarseness [R49.0] 07/18/2014  . Abdominal pain [R10.9] 05/05/2014  . Back pain [M54.9] 10/05/2013  . Knee pain [M25.569] 07/14/2013  . Oral pain [K13.79] 05/06/2013  . Itching [L29.9] 01/05/2013  . History of breast cancer [Z85.3] 11/20/2012  . Syncope [R55] 11/20/2012  . Hypothyroidism [E03.9] 08/10/2012  . Difficulty sleeping [G47.9] 08/10/2012  . Dizziness [R42] 11/08/2010  . Hyperlipemia [E78.5] 03/23/2009  . HYPERTENSION, BENIGN [I10] 03/23/2009  . CAD, NATIVE VESSEL [I25.10] 09/24/2008  . ORTHOSTATIC HYPOTENSION [I95.1] 09/24/2008  . ABNORMAL EKG [R94.31] 09/24/2008    Total Time spent with patient: 20 minutes  Subjective:   Angela Howe is a 83 y.o. female patient admitted with "I have been having weakness".  Follow-up for Thursday the 25th. Patient seen chart reviewed. Patient was awake and more appropriately interactive today. Smiling. Complaint still about her back pain. Still does not seem to be depressed or hopeless. Somewhat similarly to yesterday, she gets confused easily and tells stories that seem to ramble back and  forth in time but doesn't seem to be quite as out of it as she did yesterday. Does not seem to be in excruciating pain although she complains of her chronic back pain.   HPI: Patient interviewed chart reviewed.  83 year old multiple recent presentations for weakness and falls.  Patient has not been eating well.  Seems to be dwindling in general.  I suspect this is probably where the main concern for depression came from.  On interview today I found the patient to be awake in bed and cooperative with interview but to have waxing and waning alertness and attention.  Patient is aware that she is in [facility] and in [place].  Thought that the year was 1918 and did not correct herself.  Did not know the year.  Patient was aware that she was in the hospital because of weakness.  When asked about falls he started in on a story and rapidly became tangential veering off into a story that made little sense and almost sounded like she was recounting a dream.  She did this multiple times during the interview and although I was able to recall her to lucidity when she spoke at length she always veered off into some confusion.  Patient denied any sense of being depressed however.  Denied any sense of sadness or hopelessness.  Admits that she sometimes has trouble sleeping.  Admits that she is not eating well but does not seem to make much of it.  She denies suicidal thoughts completely.  Denies any hallucinations or psychotic symptoms.  Patient says she does have positive things in her life to look forward to and that make life worth living.  When I asked her  what those would be she started in to talk about her lunchtime meal and then once again veered off into a nonsensical narrative.  Patient was smiling and pleasant however throughout the interview.  Social history: Patient tells me that she lives at home alone.  I looked through the chart and I am not sure if this is entirely correct.  He tells me that  her husband died in 71 and that she has 2 children.  I do see mention of one daughter in the chart.  Medical history: Multiple medical problems Including history of cancer coronary artery disease chronic back pain gastric reflux recent falls and weakness.  Substance abuse history: None Past Psychiatric History: Patient denied being aware of any health treatment.  Denied ever seeing a psychiatrist or therapist.  Denied any history of psychiatric medicine.  Patient denied any history of suicidality no history of hospitalization.  Looking at her medicines before admission it does look like she was on citalopram 20 mg a day.  Not clear to me what this was initially prescribed for or how long she has been on it.  She does not have a depression or anxiety diagnosis in her problem list.  She is on amitriptyline 50 mg at night and this also appears to have been in place for quite a while.  Patient had no knowledge of either of these medicines and could not tell me why she was taking them.  I suspect the Elavil dose was added for chronic pain and sleep at some point.  Risk to Self: Is patient at risk for suicide?: No Risk to Others:   Prior Inpatient Therapy:   Prior Outpatient Therapy:    Past Medical History:  Past Medical History:  Diagnosis Date  . Breast cancer ([place])   . CAD (coronary artery disease)   . Degenerative disc disease   . GERD (gastroesophageal reflux disease)   . Hyperlipidemia    Previous intolerance of statins  . Hypothyroidism   . Orthostatic hypotension   . Osteoarthritis   . Peptic ulcer disease   . Syncope     Past Surgical History:  Procedure Laterality Date  . ABDOMINAL HYSTERECTOMY  1970  . APPENDECTOMY    . BIOPSY BREAST  2014  . BREAST EXCISIONAL BIOPSY Left 2014   neg  . BREAST EXCISIONAL BIOPSY Left 2006   postive radation  . BREAST LUMPECTOMY  2006  . CHOLECYSTECTOMY    . CORONARY ARTERY BYPASS GRAFT  05/2006   x3  . NASAL SINUS SURGERY    . ROTATOR  CUFF REPAIR     Family History:  Family History  Problem Relation Age of Onset  . Heart attack Sister   . Cancer Brother   . Pancreatic cancer Brother   . Ovarian cancer Sister    Family Psychiatric  History: She says he had a sister who was "sad". Social History:  History  Alcohol Use No     History  Drug Use No    Social History   Social History  . Marital status: Widowed    Spouse name: N/A  . Number of children: N/A  . Years of education: N/A   Occupational History  . Retired Retired   Social History Main Topics  . Smoking status: Never Smoker  . Smokeless tobacco: Never Used  . Alcohol use No  . Drug use: No  . Sexual activity: Not Asked   Other Topics Concern  . None   Social History Narrative  Widowed   Gets regular exercise   Additional Social History:    Allergies:   Allergies  Allergen Reactions  . Alendronate Sodium Other (See Comments)    Reaction:  Restlessness   . Prednisone Other (See Comments)    Reaction:  Restlessness     Labs:  Results for orders placed or performed during the hospital encounter of 02/20/17 (from the past 48 hour(s))  Basic metabolic panel     Status: Abnormal   Collection Time: 02/21/17  4:06 AM  Result Value Ref Range   Sodium 142 135 - 145 mmol/L   Potassium 4.0 3.5 - 5.1 mmol/L   Chloride 116 (H) 101 - 111 mmol/L   CO2 20 (L) 22 - 32 mmol/L   Glucose, Bld 89 65 - 99 mg/dL   BUN 54 (H) 6 - 20 mg/dL   Creatinine, Ser 1.08 (H) 0.44 - 1.00 mg/dL   Calcium 7.9 (L) 8.9 - 10.3 mg/dL   GFR calc non Af Amer 44 (L) >60 mL/min   GFR calc Af Amer 51 (L) >60 mL/min    Comment: (NOTE) The eGFR has been calculated using the CKD EPI equation. This calculation has not been validated in all clinical situations. eGFR's persistently <60 mL/min signify possible Chronic Kidney Disease.    Anion gap 6 5 - 15  Folate     Status: None   Collection Time: 02/21/17  4:06 AM  Result Value Ref Range   Folate 24.0 >5.9 ng/mL   Iron and TIBC     Status: Abnormal   Collection Time: 02/21/17  4:06 AM  Result Value Ref Range   Iron 37 28 - 170 ug/dL   TIBC 205 (L) 250 - 450 ug/dL   Saturation Ratios 18 10.4 - 31.8 %   UIBC 168 ug/dL  Ferritin     Status: None   Collection Time: 02/21/17  4:06 AM  Result Value Ref Range   Ferritin 83 11 - 307 ng/mL  Reticulocytes     Status: Abnormal   Collection Time: 02/21/17  4:06 AM  Result Value Ref Range   Retic Ct Pct 3.0 0.4 - 3.1 %   RBC. 2.26 (L) 3.80 - 5.20 MIL/uL   Retic Count, Absolute 67.8 19.0 - 183.0 K/uL  CBC     Status: Abnormal   Collection Time: 02/21/17  5:04 AM  Result Value Ref Range   WBC 13.1 (H) 3.6 - 11.0 K/uL   RBC 2.29 (L) 3.80 - 5.20 MIL/uL   Hemoglobin 7.0 (L) 12.0 - 16.0 g/dL   HCT 20.9 (L) 35.0 - 47.0 %   MCV 91.1 80.0 - 100.0 fL   MCH 30.4 26.0 - 34.0 pg   MCHC 33.4 32.0 - 36.0 g/dL   RDW 14.5 11.5 - 14.5 %   Platelets 334 150 - 440 K/uL  ABO/Rh     Status: None   Collection Time: 02/21/17  5:04 AM  Result Value Ref Range   ABO/RH(D) A POS   Vitamin B12     Status: None   Collection Time: 02/21/17  9:50 AM  Result Value Ref Range   Vitamin B-12 654 180 - 914 pg/mL    Comment: (NOTE) This assay is not validated for testing neonatal or myeloproliferative syndrome specimens for Vitamin B12 levels. Performed at [facility], [place] [address], [place], [place] [address]   Type and screen [place]     Status: None   Collection Time: 02/21/17  9:50  AM  Result Value Ref Range   ABO/RH(D) A POS    Antibody Screen NEG    Sample Expiration 02/24/2017    Unit Number [id number]    Blood Component Type RED CELLS,LR    Unit division 00    Status of Unit ISSUED,FINAL    Transfusion Status OK TO TRANSFUSE    Crossmatch Result Compatible   Prepare RBC     Status: None   Collection Time: 02/21/17  9:50 AM  Result Value Ref Range   Order Confirmation ORDER PROCESSED BY BLOOD BANK   CBC     Status: Abnormal    Collection Time: 02/22/17  4:16 AM  Result Value Ref Range   WBC 11.2 (H) 3.6 - 11.0 K/uL   RBC 2.91 (L) 3.80 - 5.20 MIL/uL   Hemoglobin 9.1 (L) 12.0 - 16.0 g/dL    Comment: RESULT REPEATED AND VERIFIED   HCT 27.3 (L) 35.0 - 47.0 %   MCV 93.8 80.0 - 100.0 fL   MCH 31.3 26.0 - 34.0 pg   MCHC 33.3 32.0 - 36.0 g/dL   RDW 16.1 (H) 11.5 - 14.5 %   Platelets 331 150 - 440 K/uL  Basic metabolic panel     Status: Abnormal   Collection Time: 02/22/17  4:16 AM  Result Value Ref Range   Sodium 142 135 - 145 mmol/L   Potassium 3.8 3.5 - 5.1 mmol/L   Chloride 113 (H) 101 - 111 mmol/L   CO2 21 (L) 22 - 32 mmol/L   Glucose, Bld 98 65 - 99 mg/dL   BUN 34 (H) 6 - 20 mg/dL   Creatinine, Ser 1.02 (H) 0.44 - 1.00 mg/dL   Calcium 8.2 (L) 8.9 - 10.3 mg/dL   GFR calc non Af Amer 47 (L) >60 mL/min   GFR calc Af Amer 55 (L) >60 mL/min    Comment: (NOTE) The eGFR has been calculated using the CKD EPI equation. This calculation has not been validated in all clinical situations. eGFR's persistently <60 mL/min signify possible Chronic Kidney Disease.    Anion gap 8 5 - 15    Current Facility-Administered Medications  Medication Dose Route Frequency Provider Last Rate Last Dose  . acetaminophen (TYLENOL) tablet 650 mg  650 mg Oral Q6H PRN Henreitta Leber, MD   650 mg at 02/20/17 1603   Or  . acetaminophen (TYLENOL) suppository 650 mg  650 mg Rectal Q6H PRN Henreitta Leber, MD      . acetaminophen (TYLENOL) tablet 650 mg  650 mg Oral Q8H Mahan, Wayna Chalet, NP   650 mg at 02/22/17 1440  . amitriptyline (ELAVIL) tablet 25 mg  25 mg Oral QHS Clapacs, Madie Reno, MD   25 mg at 02/21/17 2142  . aspirin EC tablet 81 mg  81 mg Oral Daily Henreitta Leber, MD   81 mg at 02/22/17 1125  . atorvastatin (LIPITOR) tablet 20 mg  20 mg Oral Daily Henreitta Leber, MD   20 mg at 02/22/17 1126  . citalopram (CELEXA) tablet 20 mg  20 mg Oral Daily Henreitta Leber, MD   20 mg at 02/22/17 1124  . docusate (COLACE) 50 MG/5ML  liquid 50 mg  50 mg Oral BID Dustin Flock, MD   50 mg at 02/21/17 2142  . dronabinol (MARINOL) capsule 2.5 mg  2.5 mg Oral BID AC Henreitta Leber, MD   2.5 mg at 02/22/17 1815  . fentaNYL (DURAGESIC - dosed mcg/hr) 12.5 mcg  12.5 mcg Transdermal Q72H Henreitta Leber, MD   12.5 mcg at 02/20/17 1603  . fludrocortisone (FLORINEF) tablet 0.1 mg  0.1 mg Oral Daily Henreitta Leber, MD   0.1 mg at 02/22/17 1124  . ketorolac (TORADOL) 15 MG/ML injection 15 mg  15 mg Intravenous Q6H PRN Henreitta Leber, MD   15 mg at 02/22/17 1201  . levothyroxine (SYNTHROID, LEVOTHROID) tablet 50 mcg  50 mcg Oral Daily Henreitta Leber, MD   50 mcg at 02/22/17 1124  . lidocaine (LIDODERM) 5 % 2 patch  2 patch Transdermal Q24H Earlie Counts, NP   2 patch at 02/22/17 1440  . multivitamin with minerals tablet 1 tablet  1 tablet Oral Daily Dustin Flock, MD   1 tablet at 02/22/17 1124  . nitroGLYCERIN (NITROSTAT) SL tablet 0.4 mg  0.4 mg Sublingual Q5 min PRN Henreitta Leber, MD      . ondansetron (ZOFRAN) tablet 4 mg  4 mg Oral Q6H PRN Henreitta Leber, MD       Or  . ondansetron (ZOFRAN) injection 4 mg  4 mg Intravenous Q6H PRN Henreitta Leber, MD      . pantoprazole (PROTONIX) EC tablet 40 mg  40 mg Oral Daily Emaley, Applin, RPH   40 mg at 02/22/17 1126    Musculoskeletal: Strength & Muscle Tone: decreased and atrophy Gait & Station: unable to stand Patient leans: N/A  Psychiatric Specialty Exam: Physical Exam  Nursing note and vitals reviewed. Constitutional: She appears well-developed.  HENT:  Head: Normocephalic and atraumatic.  Eyes: Pupils are equal, round, and reactive to light. Conjunctivae are normal.  Neck: Normal range of motion.  Cardiovascular: Normal heart sounds.   Respiratory: Effort normal.  GI: Soft.  Musculoskeletal: Normal range of motion.  Neurological: She is alert.  Skin: Skin is warm and dry.  Psychiatric: She has a normal mood and affect. Her speech is tangential. She is  not slowed. Thought content is not paranoid. She expresses no homicidal and no suicidal ideation. She exhibits abnormal recent memory and abnormal remote memory.    Review of Systems  HENT: Negative.   Eyes: Negative.   Respiratory: Negative.   Cardiovascular: Negative.   Gastrointestinal: Negative.   Musculoskeletal: Positive for falls.  Skin: Negative.   Neurological: Positive for weakness.  Psychiatric/Behavioral: Negative for depression, hallucinations, memory loss, substance abuse and suicidal ideas. The patient is not nervous/anxious and does not have insomnia.     Blood pressure (!) 102/46, pulse 93, temperature 98 F (36.7 C), temperature source Oral, resp. rate 18, height 5' 4"  (1.626 m), weight 103 lb (46.7 kg), SpO2 95 %.Body mass index is 17.68 kg/m.  General Appearance: Casual  Eye Contact:  Minimal  Speech:  Slow  Volume:  Decreased  Mood:  Euthymic  Affect:  Congruent  Thought Process:  Disorganized  Orientation:  Other:  Partially.  This would wax and wane during the interview.  During times when she was focusing her attention more she was able to give me some pretty lucid answers.  At other times she seemed to drift into a lot of confusion.  Thought Content:  Rumination and Tangential  Suicidal Thoughts:  No  Homicidal Thoughts:  No  Memory:  Immediate;   Fair Recent;   Poor Remote;   Fair  Judgement:  Fair  Insight:  Fair  Psychomotor Activity:  Decreased  Concentration:  Concentration: Poor  Recall:  [employer] of Knowledge:  Fair  Language:  Good  Akathisia:  No  Handed:  Right  AIMS (if indicated):     Assets:  Desire for Improvement Social Support  ADL's:  Impaired  Cognition:  Impaired,  Mild  Sleep:        Treatment Plan Summary: Medication management and Plan 83 year old woman who was seen yesterday. She is not showing any signs of depression I don't think. I still think she is showing some mild signs of delirium but she at least seems happy.  I think it's appropriate to keep her on the lower dose of amitriptyline. She is on multiple other medicines that could contribute to delirium. At this point she is likely to be discharged soon. I wouldn't necessarily change anything else. I will sign out and less needed.  Disposition: Patient does not meet criteria for psychiatric inpatient admission. Supportive therapy provided about ongoing stressors.  Alethia Berthold, MD 02/22/2017 8:04 PM

## 2017-02-23 ENCOUNTER — Telehealth: Payer: Self-pay | Admitting: Internal Medicine

## 2017-02-23 DIAGNOSIS — K279 Peptic ulcer, site unspecified, unspecified as acute or chronic, without hemorrhage or perforation: Secondary | ICD-10-CM | POA: Diagnosis not present

## 2017-02-23 DIAGNOSIS — W19XXXD Unspecified fall, subsequent encounter: Secondary | ICD-10-CM | POA: Diagnosis not present

## 2017-02-23 DIAGNOSIS — R410 Disorientation, unspecified: Secondary | ICD-10-CM | POA: Diagnosis not present

## 2017-02-23 DIAGNOSIS — E785 Hyperlipidemia, unspecified: Secondary | ICD-10-CM | POA: Diagnosis not present

## 2017-02-23 DIAGNOSIS — Z853 Personal history of malignant neoplasm of breast: Secondary | ICD-10-CM | POA: Diagnosis not present

## 2017-02-23 DIAGNOSIS — W19XXXA Unspecified fall, initial encounter: Secondary | ICD-10-CM | POA: Diagnosis not present

## 2017-02-23 DIAGNOSIS — Z79899 Other long term (current) drug therapy: Secondary | ICD-10-CM | POA: Diagnosis not present

## 2017-02-23 DIAGNOSIS — Z9181 History of falling: Secondary | ICD-10-CM | POA: Diagnosis not present

## 2017-02-23 DIAGNOSIS — M1990 Unspecified osteoarthritis, unspecified site: Secondary | ICD-10-CM | POA: Diagnosis not present

## 2017-02-23 DIAGNOSIS — N390 Urinary tract infection, site not specified: Secondary | ICD-10-CM | POA: Diagnosis not present

## 2017-02-23 DIAGNOSIS — R627 Adult failure to thrive: Secondary | ICD-10-CM | POA: Diagnosis not present

## 2017-02-23 DIAGNOSIS — D649 Anemia, unspecified: Secondary | ICD-10-CM | POA: Diagnosis not present

## 2017-02-23 DIAGNOSIS — R51 Headache: Secondary | ICD-10-CM | POA: Diagnosis not present

## 2017-02-23 DIAGNOSIS — R55 Syncope and collapse: Secondary | ICD-10-CM | POA: Diagnosis not present

## 2017-02-23 DIAGNOSIS — Z7982 Long term (current) use of aspirin: Secondary | ICD-10-CM | POA: Diagnosis not present

## 2017-02-23 DIAGNOSIS — E860 Dehydration: Secondary | ICD-10-CM | POA: Diagnosis not present

## 2017-02-23 DIAGNOSIS — Y92129 Unspecified place in nursing home as the place of occurrence of the external cause: Secondary | ICD-10-CM | POA: Diagnosis not present

## 2017-02-23 DIAGNOSIS — S0990XA Unspecified injury of head, initial encounter: Secondary | ICD-10-CM | POA: Diagnosis not present

## 2017-02-23 DIAGNOSIS — I10 Essential (primary) hypertension: Secondary | ICD-10-CM | POA: Diagnosis not present

## 2017-02-23 DIAGNOSIS — M8000XD Age-related osteoporosis with current pathological fracture, unspecified site, subsequent encounter for fracture with routine healing: Secondary | ICD-10-CM | POA: Diagnosis not present

## 2017-02-23 DIAGNOSIS — G9340 Encephalopathy, unspecified: Secondary | ICD-10-CM | POA: Diagnosis not present

## 2017-02-23 DIAGNOSIS — D638 Anemia in other chronic diseases classified elsewhere: Secondary | ICD-10-CM | POA: Diagnosis not present

## 2017-02-23 DIAGNOSIS — Y999 Unspecified external cause status: Secondary | ICD-10-CM | POA: Diagnosis not present

## 2017-02-23 DIAGNOSIS — S2241XD Multiple fractures of ribs, right side, subsequent encounter for fracture with routine healing: Secondary | ICD-10-CM | POA: Diagnosis not present

## 2017-02-23 DIAGNOSIS — I2510 Atherosclerotic heart disease of native coronary artery without angina pectoris: Secondary | ICD-10-CM | POA: Diagnosis not present

## 2017-02-23 DIAGNOSIS — Z951 Presence of aortocoronary bypass graft: Secondary | ICD-10-CM | POA: Diagnosis not present

## 2017-02-23 DIAGNOSIS — Z7401 Bed confinement status: Secondary | ICD-10-CM | POA: Diagnosis not present

## 2017-02-23 DIAGNOSIS — E039 Hypothyroidism, unspecified: Secondary | ICD-10-CM | POA: Diagnosis not present

## 2017-02-23 DIAGNOSIS — Y9389 Activity, other specified: Secondary | ICD-10-CM | POA: Diagnosis not present

## 2017-02-23 DIAGNOSIS — M6281 Muscle weakness (generalized): Secondary | ICD-10-CM | POA: Diagnosis not present

## 2017-02-23 DIAGNOSIS — K219 Gastro-esophageal reflux disease without esophagitis: Secondary | ICD-10-CM | POA: Diagnosis not present

## 2017-02-23 DIAGNOSIS — J189 Pneumonia, unspecified organism: Secondary | ICD-10-CM | POA: Diagnosis not present

## 2017-02-23 LAB — VITAMIN D 25 HYDROXY (VIT D DEFICIENCY, FRACTURES): Vit D, 25-Hydroxy: 9.9 ng/mL — ABNORMAL LOW (ref 30.0–100.0)

## 2017-02-23 MED ORDER — DOCUSATE SODIUM 50 MG/5ML PO LIQD: 50.0000 mg | Freq: Two times a day (BID) | ORAL | 0 refills | Status: DC

## 2017-02-23 MED ORDER — MEGESTROL ACETATE 625 MG/5ML PO SUSP: 625.0000 mg | Freq: Every day | ORAL | 0 refills | Status: AC

## 2017-02-23 MED ORDER — CALCIUM CARBONATE-VITAMIN D 500-200 MG-UNIT PO TABS
2.0000 | ORAL_TABLET | Freq: Two times a day (BID) | ORAL | Status: DC
  Administered 2017-02-23: 2 via ORAL
  Filled 2017-02-23: qty 2

## 2017-02-23 MED ORDER — AMITRIPTYLINE HCL 25 MG PO TABS: 25.0000 mg | ORAL_TABLET | Freq: Every day | ORAL | Status: AC

## 2017-02-23 MED ORDER — FENTANYL 12 MCG/HR TD PT72: 12.5000 ug | MEDICATED_PATCH | TRANSDERMAL | 0 refills | Status: DC

## 2017-02-23 MED ORDER — VITAMIN D (ERGOCALCIFEROL) 1.25 MG (50000 UNIT) PO CAPS
50000.0000 [IU] | ORAL_CAPSULE | ORAL | Status: DC
  Administered 2017-02-23: 10:00:00 50000 [IU] via ORAL
  Filled 2017-02-23: qty 1

## 2017-02-23 MED ORDER — ACETAMINOPHEN 325 MG PO TABS: 650.0000 mg | ORAL_TABLET | Freq: Four times a day (QID) | ORAL | Status: AC | PRN

## 2017-02-23 MED ORDER — CALCIUM CARBONATE-VITAMIN D 500-200 MG-UNIT PO TABS: 2.0000 | ORAL_TABLET | Freq: Two times a day (BID) | ORAL | Status: AC

## 2017-02-23 MED ORDER — LIDOCAINE 5 % EX PTCH: 2.0000 | MEDICATED_PATCH | CUTANEOUS | 0 refills | Status: AC

## 2017-02-23 MED ORDER — VITAMIN D (ERGOCALCIFEROL) 1.25 MG (50000 UNIT) PO CAPS: 50000.0000 [IU] | ORAL_CAPSULE | ORAL | Status: AC

## 2017-02-23 MED ORDER — ADULT MULTIVITAMIN W/MINERALS CH: 1.0000 | ORAL_TABLET | Freq: Every day | ORAL | Status: AC

## 2017-02-23 NOTE — Progress Notes (Signed)
Clinical Education officer, museum (CSW) contacted patient's daughter [name] and presented bed offers. Daughter chose [facility]. Patient is medically stable for D/C to [facility] today. Per [facility] admissions coordinator at [facility] patient can come today to room 406. RN will call report and arrange EMS after 1 pm today. CSW sent D/C orders to [facility] via [place]. Patient is aware of above. Patient's daughter [name] is aware of above. Please reconsult if future social work needs arise. CSW signing off.   [employer], LCSW [phone]

## 2017-02-23 NOTE — Telephone Encounter (Signed)
Sorry , patient discharging to [facility].

## 2017-02-23 NOTE — Telephone Encounter (Signed)
Patient is being released today for Dehydration no appointments available for HFU, needs to be seen by 03/09/17

## 2017-02-23 NOTE — Progress Notes (Signed)
New referral for Palliative to follow at [facility] received from [place]. Plan is for discharge today. Patient information faxed to referral. Thank you. Flo Shanks RN, BSN, [place] [facility] [place], [facility] [phone] c

## 2017-02-23 NOTE — Telephone Encounter (Signed)
Is she going to skilled nursing.  I thought the note stated needed rehab.  Just wanted to clarify.

## 2017-02-23 NOTE — Discharge Summary (Addendum)
[facility], 83 y.o., DOB Jan 09, 1926, MRN [id number]. Admission date: 02/20/2017 Discharge Date 02/23/2017 Primary MD Einar Pheasant, MD Admitting Physician Henreitta Leber, MD  Admission Diagnosis  Dehydration [E86.0] Ketonuria [R82.4] Elevated BUN [R79.9] Failure to thrive in adult [R62.7]  Discharge Diagnosis   Principal Problem:  Symptomatic anemia- due to worsening anemia of chronic disease  Subacute delirium suspect likely due to underlying dementia Adult failure to thrive Recent urinary tract infection status post treatment Hypothyroidism History of orthostatic hypotension Depression Chronic back pain GERD Coronary artery disease History of breast cancer Severe vitamin d [facility] Course Angela Howe  is a 83 y.o. female with a known history of breast cancer, coronary artery disease, GERD, degenerative disc disease, hypothyroidism, orthostatic hypotension, previous history of syncope, adult failure to thrive who presents to the hospital due to generalized weakness, frequent falls and poor p.o. Intake. Patient was seen in the emergency room 2 times previously for recurrent falls. She was again evaluated for a fall. In the emergency room she was noted to have's severe anemia. Patient was admitted for further evaluation and therapy. She was transfused blood. Her anemia is likely worsening anemia of chronic disease. Patient does have intermittent confusion and likely has underlying dementia. She was seen by psychiatry who felt that she was not depressed.  Poor appetite patient had a CT scan of the chest abdomen which showed no acute pathology.  Patient is very weak and deconditioned and in need of rehabilitation therapy.      Palliative care team to follow the patient at the facility       Consults  psychiatry  Significant Tests:  See full reports for all details     Dg Ribs Unilateral W/chest  Left  Result Date: 02/14/2017 CLINICAL DATA:  Recent fall.  Chest pain EXAM: LEFT RIBS AND CHEST - 3+ VIEW COMPARISON:  10/12/2015 FINDINGS: CABG. Negative for heart failure. Lungs are clear without infiltrate effusion or pneumothorax Negative for left rib fracture. IMPRESSION: Negative. Electronically Signed   By: Franchot Gallo M.D.   On: 02/14/2017 18:43   Dg Thoracic Spine 2 View  Result Date: 02/12/2017 CLINICAL DATA:  Multiple falls most recently this morning. Patient reports upper and lower back pain. EXAM: THORACIC SPINE 2 VIEWS COMPARISON:  Thoracic spine series of January 07, 2016 FINDINGS: The patient has undergone previous kyphoplasty in the lower lumbar spine. There are compression fractures of T8, T9, and T11 which are stable. There is mild anterior wedging of the bodies of T5 and T2. The T2 compression appears new. There are no abnormal paravertebral soft tissue densities. The pedicles appear intact. IMPRESSION: Stable compressions of T5, T8, T9, and T11. The latter 3 are high-grade. There is new mild anterior compression of the body of T10. Electronically Signed   By: David  Martinique M.D.   On: 02/12/2017 12:51   Dg Lumbar Spine 2-3 Views  Result Date: 02/12/2017 CLINICAL DATA:  Multiple falls, most recent this morning. Back pain. EXAM: LUMBAR SPINE - 2-3 VIEW COMPARISON:  12/21/2016 CT abdomen/ pelvis. FINDINGS: This report assumes 5 non rib-bearing lumbar vertebrae. Diffuse osteopenia. Severe T8 vertebral compression fracture, chronic, with interval height loss since 10/12/2015 chest CT. Severe T9 vertebral compression fracture, chronic, with interval height loss since 10/12/2015 chest CT. Severe T11 vertebral compression fracture, chronic, stable since 10/12/2015 chest CT. Moderate L3 vertebral compression fracture status post vertebroplasty, stable since 12/21/2016 CT abdomen/pelvis study. Moderate  to severe L4 vertebral compression fracture, stable since 12/21/2016 CT. Remaining  visualized thoracolumbar vertebral body heights are preserved with no acute fracture. Moderate to severe multilevel degenerative disc disease in the visualized thoracolumbar spine, most prominent at L2-3 and L3-4, not appreciably changed. No spondylolisthesis. Mild facet arthropathy bilaterally in the lower lumbar spine. No aggressive appearing focal osseous lesions. Abdominal aortic atherosclerosis. Visualized lower sternotomy wires appear intact. Cholecystectomy clips are seen in the right upper quadrant of the abdomen. IMPRESSION: 1. No acute vertebral compression fracture in the visualized thoracolumbar spine . 2. Multiple chronic vertebral compression fractures in the visualized thoracolumbar spine, some of which demonstrate interval worsening loss of vertebral body height as detailed. 3. No spondylolisthesis. 4. Moderate to severe multilevel degenerative disc disease. Electronically Signed   By: Ilona Sorrel M.D.   On: 02/12/2017 12:53   Ct Head Wo Contrast  Result Date: 02/12/2017 CLINICAL DATA:  Mechanical fall this morning. Head injury on door knob. Initial encounter. EXAM: CT HEAD WITHOUT CONTRAST TECHNIQUE: Contiguous axial images were obtained from the base of the skull through the vertex without intravenous contrast. COMPARISON:  10/01/2015 FINDINGS: Brain: No evidence of acute infarction, hemorrhage, hydrocephalus, extra-axial collection or mass lesion/mass effect. Generalized atrophy and confluent chronic small vessel ischemia in the periventricular white matter. Remote lacunar infarct in the left caudate head. Vascular: Atherosclerotic calcification.  No hyperdense vessel. Skull: Left parietal scalp laceration without fracture. Sinuses/Orbits: No evidence of injury. Bilateral cataract resection. IMPRESSION: 1. No evidence of intracranial injury. 2. Left parietal scalp laceration without fracture. Electronically Signed   By: Monte Fantasia M.D.   On: 02/12/2017 12:59   Ct Chest W  Contrast  Result Date: 02/21/2017 CLINICAL DATA:  None localized abdominal pain and unintended weight loss. Adult failure to thrive. Personal history of breast carcinoma. EXAM: CT CHEST, ABDOMEN, AND PELVIS WITH CONTRAST TECHNIQUE: Multidetector CT imaging of the chest, abdomen and pelvis was performed following the standard protocol during bolus administration of intravenous contrast. CONTRAST:  36mL ISOVUE-300 IOPAMIDOL (ISOVUE-300) INJECTION 61% COMPARISON:  Chest CT on 10/12/2015 and AP CT on 02/14/2017 FINDINGS: CT CHEST FINDINGS Cardiovascular: No acute findings. Aortic and coronary artery atherosclerosis. Prior CABG. Mediastinum/Lymph Nodes: No masses or pathologically enlarged lymph nodes identified. Stable post lumpectomy changes in left breast. Lungs/Pleura: No pulmonary infiltrate or mass identified. Mild bibasilar scarring. No effusion present. Musculoskeletal: Stable compression fractures of T6, T8, T9, and T11 vertebral bodies. CT ABDOMEN AND PELVIS FINDINGS Hepatobiliary: No masses identified. Prior cholecystectomy. No evidence of biliary obstruction. Pancreas:  No mass or inflammatory changes. Spleen:  Within normal limits in size and appearance. Adrenals/Urinary tract: No masses or hydronephrosis. Stable small left renal cyst. Stomach/Bowel: Stable small hiatal hernia. Stable gastric antrum wall thickening with gas containing ulcer in the superior wall. No evidence free intraperitoneal air. Sigmoid diverticulosis, without evidence of diverticulitis . No evidence of bowel obstruction. Vascular/Lymphatic: No pathologically enlarged lymph nodes identified. No abdominal aortic aneurysm. Aortic atherosclerosis. Reproductive: Prior hysterectomy noted. Adnexal regions are unremarkable in appearance. Other:  None. Musculoskeletal: No suspicious bone lesions identified. Previous L3 and L4 vertebroplasties. IMPRESSION: No significant change in appearance of gastric antral ulcer. No evidence of perforation  or other complication. Stable small hiatal hernia. Colonic diverticulosis, without radiographic evidence of diverticulitis. Aortic and coronary artery atherosclerosis. Old thoracic and lumbar vertebral compression fracture deformities. Previous L3 and L4 vertebroplasties. Electronically Signed   By: Earle Gell M.D.   On: 02/21/2017 12:53   Ct Abdomen Pelvis W Contrast  Result Date: 02/21/2017 CLINICAL DATA:  None localized abdominal pain and unintended weight loss. Adult failure to thrive. Personal history of breast carcinoma. EXAM: CT CHEST, ABDOMEN, AND PELVIS WITH CONTRAST TECHNIQUE: Multidetector CT imaging of the chest, abdomen and pelvis was performed following the standard protocol during bolus administration of intravenous contrast. CONTRAST:  60mL ISOVUE-300 IOPAMIDOL (ISOVUE-300) INJECTION 61% COMPARISON:  Chest CT on 10/12/2015 and AP CT on 02/14/2017 FINDINGS: CT CHEST FINDINGS Cardiovascular: No acute findings. Aortic and coronary artery atherosclerosis. Prior CABG. Mediastinum/Lymph Nodes: No masses or pathologically enlarged lymph nodes identified. Stable post lumpectomy changes in left breast. Lungs/Pleura: No pulmonary infiltrate or mass identified. Mild bibasilar scarring. No effusion present. Musculoskeletal: Stable compression fractures of T6, T8, T9, and T11 vertebral bodies. CT ABDOMEN AND PELVIS FINDINGS Hepatobiliary: No masses identified. Prior cholecystectomy. No evidence of biliary obstruction. Pancreas:  No mass or inflammatory changes. Spleen:  Within normal limits in size and appearance. Adrenals/Urinary tract: No masses or hydronephrosis. Stable small left renal cyst. Stomach/Bowel: Stable small hiatal hernia. Stable gastric antrum wall thickening with gas containing ulcer in the superior wall. No evidence free intraperitoneal air. Sigmoid diverticulosis, without evidence of diverticulitis . No evidence of bowel obstruction. Vascular/Lymphatic: No pathologically enlarged lymph  nodes identified. No abdominal aortic aneurysm. Aortic atherosclerosis. Reproductive: Prior hysterectomy noted. Adnexal regions are unremarkable in appearance. Other:  None. Musculoskeletal: No suspicious bone lesions identified. Previous L3 and L4 vertebroplasties. IMPRESSION: No significant change in appearance of gastric antral ulcer. No evidence of perforation or other complication. Stable small hiatal hernia. Colonic diverticulosis, without radiographic evidence of diverticulitis. Aortic and coronary artery atherosclerosis. Old thoracic and lumbar vertebral compression fracture deformities. Previous L3 and L4 vertebroplasties. Electronically Signed   By: Earle Gell M.D.   On: 02/21/2017 12:53   Ct Abdomen Pelvis W Contrast  Result Date: 02/14/2017 CLINICAL DATA:  Recurrent falls. Known compression fracture. LEFT flank pain. History of breast cancer, peptic ulcer disease, appendectomy, hysterectomy and cholecystectomy. EXAM: CT ABDOMEN AND PELVIS WITH CONTRAST TECHNIQUE: Multidetector CT imaging of the abdomen and pelvis was performed using the standard protocol following bolus administration of intravenous contrast. CONTRAST:  54mL ISOVUE-300 IOPAMIDOL (ISOVUE-300) INJECTION 61% COMPARISON:  CT abdomen and pelvis December 21, 2016 and lumbar spine radiographs February 12, 2017. Mammogram report January 25, 2016. FINDINGS: LOWER CHEST: Lung bases are clear. Included heart size is normal. Mitral annular calcifications. No pericardial effusion. Status post median sternotomy. HEPATOBILIARY: Scattered hepatic granulomas, liver is otherwise unremarkable. Status post cholecystectomy. PANCREAS: Atrophic, nonacute. SPLEEN: Normal. ADRENALS/URINARY TRACT: Kidneys are orthotopic, demonstrating symmetric enhancement. No nephrolithiasis, hydronephrosis or solid renal masses. Mild LEFT pelvicaliectasis. Multiple homogeneously hypodense benign-appearing LEFT renal cysts measuring to 2.5 cm. The unopacified ureters are  normal in course and caliber. Delayed imaging through the kidneys demonstrates symmetric prompt contrast excretion within the proximal urinary collecting system. Urinary bladder is well distended and unremarkable. Normal adrenal glands. STOMACH/BOWEL: Small hiatal hernia. Severe sigmoid diverticulosis. No significant residual colonic bowel wall thickening though sensitivity decreased without oral contrast. Thickened, edematous gastric antrum on proximal duodenum. 1.2 cm gastric antral ulcer superior wall. VASCULAR/LYMPHATIC: Aortoiliac vessels are normal in course and caliber. Moderate to severe calcific atherosclerosis . No lymphadenopathy by CT size criteria. REPRODUCTIVE: Status post hysterectomy. OTHER: No intraperitoneal free fluid or free air. MUSCULOSKELETAL: Nonacute. Osteopenia. Multiple Tarlov cysts. Old T11 vertebral plana. Old mild-to-moderate L5 compression fracture. Status post L3 and L4 vertebral body cement augmentation. 17 mm partially calcified LEFT breast mass. Severe degenerative change of  the RIGHT hip. IMPRESSION: 1. Mild gastritis with intact antral ulcer. 2. Severe sigmoid diverticulosis, resolved pancolitis. 3. 17 mm partially calcified LEFT breast mass, not reported on prior mammogram. Recommend follow-up mammogram and ultrasound. Aortic Atherosclerosis (ICD10-I70.0). Electronically Signed   By: Elon Alas M.D.   On: 02/14/2017 22:30   Dg Chest Portable 1 View  Result Date: 02/20/2017 CLINICAL DATA:  83 year old female with weakness. Recent fall. Subsequent encounter. EXAM: PORTABLE CHEST 1 VIEW COMPARISON:  02/14/2017. FINDINGS: No infiltrate, congestive heart failure or pneumothorax. Skin fold on the left. Post median sternotomy/ CABG.  Heart size within normal limits. Calcified slightly tortuous aorta. Prior right shoulder surgery. CT detected T11 compression fracture not well delineated by plain film exam. IMPRESSION: No acute pulmonary abnormality. Aortic Atherosclerosis  (ICD10-I70.0). Electronically Signed   By: Genia Del M.D.   On: 02/20/2017 12:12   Dg Knee Complete 4 Views Right  Result Date: 02/14/2017 CLINICAL DATA:  Right knee pain after fall 2 days ago. EXAM: RIGHT KNEE - COMPLETE 4+ VIEW COMPARISON:  None. FINDINGS: Femorotibial and patellofemoral joint space narrowing is noted with mild chondrocalcinosis. No acute fracture or joint dislocation. No joint effusion. Surgical clips are seen in the popliteal fossa. IMPRESSION: 1. Mild tricompartmental joint space narrowing of the right knee. 2. No acute fracture nor joint dislocation.  No joint effusion Electronically Signed   By: Ashley Royalty M.D.   On: 02/14/2017 18:44       Today   Subjective:   Angela Howe  patient currently has no symptoms  Objective:   Blood pressure (!) 142/77, pulse 90, temperature (!) 97.3 F (36.3 C), resp. rate 20, height 5\' 4"  (1.626 m), weight 103 lb (46.7 kg), SpO2 98 %.  .  Intake/Output Summary (Last 24 hours) at 02/23/17 0812 Last data filed at 02/22/17 1424  Gross per 24 hour  Intake              360 ml  Output                0 ml  Net              360 ml    Exam VITAL SIGNS: Blood pressure (!) 142/77, pulse 90, temperature (!) 97.3 F (36.3 C), resp. rate 20, height 5\' 4"  (1.626 m), weight 103 lb (46.7 kg), SpO2 98 %.  GENERAL:  83 y.o.-year-old patient lying in the bed with no acute distress.  EYES: Pupils equal, round, reactive to light and accommodation. No scleral icterus. Extraocular muscles intact.  HEENT: Head atraumatic, normocephalic. Oropharynx and nasopharynx clear.  NECK:  Supple, no jugular venous distention. No thyroid enlargement, no tenderness.  LUNGS: Normal breath sounds bilaterally, no wheezing, rales,rhonchi or crepitation. No use of accessory muscles of respiration.  CARDIOVASCULAR: S1, S2 normal. No murmurs, rubs, or gallops.  ABDOMEN: Soft, nontender, nondistended. Bowel sounds present. No organomegaly or mass.  EXTREMITIES: No  pedal edema, cyanosis, or clubbing.  NEUROLOGIC: Cranial nerves II through XII are intact. Muscle strength 5/5 in all extremities. Sensation intact. Gait not checked.  PSYCHIATRIC: The patient is alert  Not oriented to place or time SKIN: No obvious rash, lesion, or ulcer.   Data Review     CBC w Diff:  Lab Results  Component Value Date   WBC 11.2 (H) 02/22/2017   HGB 9.1 (L) 02/22/2017   HGB 11.3 (L) 10/23/2013   HCT 27.3 (L) 02/22/2017   HCT 33.4 (L) 10/23/2013   PLT 331  02/22/2017   PLT 222 10/23/2013   LYMPHOPCT 9 02/20/2017   LYMPHOPCT 29.4 10/23/2013   MONOPCT 3 02/20/2017   MONOPCT 9.2 10/23/2013   EOSPCT 0 02/20/2017   EOSPCT 4.0 10/23/2013   BASOPCT 0 02/20/2017   BASOPCT 0.4 10/23/2013   CMP:  Lab Results  Component Value Date   NA 142 02/22/2017   NA 142 10/23/2013   K 3.8 02/22/2017   K 4.0 10/23/2013   CL 113 (H) 02/22/2017   CL 110 (H) 10/23/2013   CO2 21 (L) 02/22/2017   CO2 25 10/23/2013   BUN 34 (H) 02/22/2017   BUN 20 (H) 10/23/2013   CREATININE 1.02 (H) 02/22/2017   CREATININE 1.00 10/23/2013   PROT 6.3 (L) 02/12/2017   PROT 6.4 10/22/2013   ALBUMIN 3.1 (L) 02/12/2017   ALBUMIN 3.2 (L) 10/22/2013   BILITOT 0.5 02/12/2017   BILITOT 0.3 10/22/2013   ALKPHOS 55 02/12/2017   ALKPHOS 129 (H) 10/22/2013   AST 36 02/12/2017   AST 62 (H) 10/22/2013   ALT 14 02/12/2017   ALT 71 10/22/2013  .  Micro Results Recent Results (from the past 240 hour(s))  Urine Culture     Status: Abnormal   Collection Time: 02/14/17  5:19 PM  Result Value Ref Range Status   Specimen Description URINE, RANDOM  Final   Special Requests NONE  Final   Culture >=100,000 COLONIES/mL ESCHERICHIA COLI (A)  Final   Report Status 02/17/2017 FINAL  Final   Organism ID, Bacteria ESCHERICHIA COLI (A)  Final      Susceptibility   Escherichia coli - MIC*    AMPICILLIN 8 SENSITIVE Sensitive     CEFAZOLIN <=4 SENSITIVE Sensitive     CEFTRIAXONE <=1 SENSITIVE Sensitive      CIPROFLOXACIN <=0.25 SENSITIVE Sensitive     GENTAMICIN <=1 SENSITIVE Sensitive     IMIPENEM <=0.25 SENSITIVE Sensitive     NITROFURANTOIN <=16 SENSITIVE Sensitive     TRIMETH/SULFA <=20 SENSITIVE Sensitive     AMPICILLIN/SULBACTAM 4 SENSITIVE Sensitive     PIP/TAZO <=4 SENSITIVE Sensitive     Extended ESBL NEGATIVE Sensitive     * >=100,000 COLONIES/mL ESCHERICHIA COLI  Urine Culture     Status: None   Collection Time: 02/20/17 11:56 AM  Result Value Ref Range Status   Specimen Description URINE, CATHETERIZED  Final   Special Requests Normal  Final   Culture   Final    NO GROWTH Performed at [facility], [address] [place], [place] [address]    Report Status 02/21/2017 FINAL  Final        Code Status Orders        Start     Ordered   02/20/17 1522  Do not attempt resuscitation (DNR)  Continuous    Question Answer Comment  In the event of cardiac or respiratory ARREST Do not call a "code blue"   In the event of cardiac or respiratory ARREST Do not perform Intubation, CPR, defibrillation or ACLS   In the event of cardiac or respiratory ARREST Use medication by any route, position, wound care, and other measures to relive pain and suffering. May use oxygen, suction and manual treatment of airway obstruction as needed for comfort.      02/20/17 1522    Code Status History    Date Active Date Inactive Code Status Order ID Comments User Context   12/21/2016  4:14 PM 12/24/2016  5:20 PM Full Code [id number]  Vianne Bulls,  Lise Auer, MD ED   12/21/2016  3:47 PM 12/21/2016  4:14 PM DNR [id number]  Epifanio Lesches, MD ED   12/21/2016  3:46 PM 12/21/2016  3:47 PM Full Code [id number]  Epifanio Lesches, MD ED   10/01/2015  5:34 PM 10/02/2015  6:18 PM DNR [id number]  Loletha Grayer, MD ED          Follow-up Information    Einar Pheasant, MD Follow up in 2 week(s).   Specialty:  Internal Medicine Why:  hosp f/u Contact information: [address] [place] [address] [phone]           Discharge Medications   Allergies as of 02/23/2017      Reactions   Alendronate Sodium Other (See Comments)   Reaction:  Restlessness    Prednisone Other (See Comments)   Reaction:  Restlessness       Medication List    STOP taking these medications   cephALEXin 500 MG capsule Commonly known as:  KEFLEX     TAKE these medications   acetaminophen 325 MG tablet Commonly known as:  TYLENOL Take 2 tablets (650 mg total) by mouth every 6 (six) hours as needed for mild pain (or Fever >/= 101).   amitriptyline 25 MG tablet Commonly known as:  ELAVIL Take 1 tablet (25 mg total) by mouth at bedtime. What changed:  medication strength  See the new instructions.   aspirin EC 81 MG tablet Take 81 mg by mouth daily.   atorvastatin 20 MG tablet Commonly known as:  LIPITOR Take 1 tablet (20 mg total) by mouth daily.   calcium-vitamin D 500-200 MG-UNIT tablet Commonly known as:  OSCAL WITH D Take 2 tablets by mouth 2 (two) times daily.   citalopram 20 MG tablet Commonly known as:  CELEXA Take 1 tablet (20 mg total) by mouth daily.   docusate 50 MG/5ML liquid Commonly known as:  COLACE Take 5 mLs (50 mg total) by mouth 2 (two) times daily.   fentaNYL 12 MCG/HR Commonly known as:  DURAGESIC - dosed mcg/hr Place 1 patch (12.5 mcg total) onto the skin every 3 (three) days.   fludrocortisone 0.1 MG tablet Commonly known as:  FLORINEF Take 1 tablet (0.1 mg total) by mouth daily.   lansoprazole 30 MG capsule Commonly known as:  PREVACID Take 30 mg by mouth daily.   levothyroxine 50 MCG tablet Commonly known as:  SYNTHROID, LEVOTHROID Take 1 tablet (50 mcg total) by mouth daily.   lidocaine 5 % Commonly known as:  LIDODERM Place 2 patches onto the skin daily. Remove & Discard patch within 12 hours or as directed by MD   megestrol 625 MG/5ML suspension Commonly known as:  MEGACE ES Take 5 mLs (625 mg total)  by mouth daily.   multivitamin with minerals Tabs tablet Take 1 tablet by mouth daily.   nitroGLYCERIN 0.4 MG SL tablet Commonly known as:  NITROSTAT Place 1 tablet (0.4 mg total) under the tongue every 5 (five) minutes as needed for chest pain.   Vitamin D (Ergocalciferol) 50000 units Caps capsule Commonly known as:  DRISDOL Take 1 capsule (50,000 Units total) by mouth every 7 (seven) days.          Total Time in preparing paper work, data evaluation and todays exam - 35 minutes  Dustin Flock M.D on 02/23/2017 at [place]  [phone]

## 2017-02-23 NOTE — Clinical Social Work Placement (Signed)
   CLINICAL SOCIAL WORK PLACEMENT  NOTE  Date:  02/23/2017  Patient Details  Name: Angela Howe MRN: [id number] Date of Birth: [birth year]  Clinical Social Work is seeking post-discharge placement for this patient at the [place] level of care (*CSW will initial, date and re-position this form in  chart as items are completed):  Yes   Patient/family provided with [facility] Work Department's list of facilities offering this level of care within the geographic area requested by the patient (or if unable, by the patient's family).  Yes   Patient/family informed of their freedom to choose among providers that offer the needed level of care, that participate in Medicare, Medicaid or managed care program needed by the patient, have an available bed and are willing to accept the patient.  Yes   Patient/family informed of [facility]'s ownership interest in [facility] and [facility], as well as of the fact that they are under no obligation to receive care at these facilities.  PASRR submitted to EDS on       PASRR number received on       Existing PASRR number confirmed on 02/22/17     FL2 transmitted to all facilities in geographic area requested by pt/family on 02/22/17     FL2 transmitted to all facilities within larger geographic area on       Patient informed that his/her managed care company has contracts with or will negotiate with certain facilities, including the following:        Yes   Patient/family informed of bed offers received.  Patient chooses bed at  [facility] )     Physician recommends and patient chooses bed at      Patient to be transferred to  [facility] ) on 02/23/17.  Patient to be transferred to facility by  [facility] )     Patient family notified on 02/23/17 of transfer.  Name of family member notified:   (Patient's daughter [name] is aware of D/C today. )     PHYSICIAN        Additional Comment:    _______________________________________________ Cristen Bredeson, Veronia Beets, LCSW 02/23/2017, 10:52 AM

## 2017-02-23 NOTE — Plan of Care (Signed)
Pt admitted for dehydration and failure to thrive. Was living at home by herself - is being d/ced to [facility] and will be followed by Palliative. She is alert but oriented only to self.  Received 1 u PRBC when Hgb was 7.0 improved to 9.1 Called report to Santiago Glad at [facility] @ [phone].  She will be transported via EMS. IV removed by [facility] Nursing Student.

## 2017-02-23 NOTE — Care Management (Signed)
Patient is followed by [facility] for SN and PT. Patient to go to [facility], [employer] notified.

## 2017-02-23 NOTE — Discharge Instructions (Signed)
[place] [facility] [place]:  Cardiac diet  DISCHARGE CONDITION:  Stable  ACTIVITY:  Activity as tolerated  OXYGEN:  Home Oxygen: No.   Oxygen Delivery: room air  DISCHARGE LOCATION:  nursing home    ADDITIONAL DISCHARGE INSTRUCTION:   If you experience worsening of your admission symptoms, develop shortness of breath, life threatening emergency, suicidal or homicidal thoughts you must seek medical attention immediately by calling 911 or calling your MD immediately  if symptoms less severe.  You Must read complete instructions/literature along with all the possible adverse reactions/side effects for all the Medicines you take and that have been prescribed to you. Take any new Medicines after you have completely understood and accpet all the possible adverse reactions/side effects.   Please note  You were cared for by a hospitalist during your hospital stay. If you have any questions about your discharge medications or the care you received while you were in the hospital after you are discharged, you can call the unit and asked to speak with the hospitalist on call if the hospitalist that took care of you is not available. Once you are discharged, your primary care physician will handle any further medical issues. Please note that NO REFILLS for any discharge medications will be authorized once you are discharged, as it is imperative that you return to your primary care physician (or establish a relationship with a primary care physician if you do not have one) for your aftercare needs so that they can reassess your need for medications and monitor your lab values.

## 2017-02-23 NOTE — Plan of Care (Signed)
Problem: Education: Goal: Knowledge of [facility] Education information/materials will improve Outcome: Progressing VSS, free of falls during shift.  Reports back pain 10/10, asleep on reassessment post scheduled PO Tylenol 650mg , no additional interventions needed.  No other complaints overnight.  Confused, impulsive occasionally during shift, easily redirected.  Bed in low position, bed alarm on.  Call bell within reach, [place].

## 2017-02-23 NOTE — Progress Notes (Signed)
PT Cancellation Note  Patient Details Name: Jenilee Franey MRN: [id number] DOB: 1926-03-06   Cancelled Treatment:    Reason Eval/Treat Not Completed: Patient declined, no reason specified. Treatment attempted, however pt politely refusing at this time as she is getting ready for discharge.    Deontez Klinke 02/23/2017, 11:27 AM Greggory Stallion, PT, DPT [phone]

## 2017-02-23 NOTE — Telephone Encounter (Signed)
Angela Howe called from [facility] needing to scheduled a 2 week HFU for pt. Pt was in for dehydration. Pt is going home.

## 2017-02-24 ENCOUNTER — Encounter: Payer: Self-pay | Admitting: Emergency Medicine

## 2017-02-24 ENCOUNTER — Emergency Department: Payer: Medicare Other

## 2017-02-24 ENCOUNTER — Emergency Department
Admission: EM | Admit: 2017-02-24 | Discharge: 2017-02-24 | Disposition: A | Discharge status: Home / Self Care | Payer: Medicare Other | Attending: Emergency Medicine | Admitting: Emergency Medicine

## 2017-02-24 DIAGNOSIS — S0990XA Unspecified injury of head, initial encounter: Secondary | ICD-10-CM | POA: Diagnosis not present

## 2017-02-24 DIAGNOSIS — Z7982 Long term (current) use of aspirin: Secondary | ICD-10-CM | POA: Diagnosis not present

## 2017-02-24 DIAGNOSIS — R55 Syncope and collapse: Secondary | ICD-10-CM | POA: Diagnosis not present

## 2017-02-24 DIAGNOSIS — W19XXXA Unspecified fall, initial encounter: Secondary | ICD-10-CM | POA: Insufficient documentation

## 2017-02-24 DIAGNOSIS — Y999 Unspecified external cause status: Secondary | ICD-10-CM | POA: Insufficient documentation

## 2017-02-24 DIAGNOSIS — Z853 Personal history of malignant neoplasm of breast: Secondary | ICD-10-CM | POA: Insufficient documentation

## 2017-02-24 DIAGNOSIS — E039 Hypothyroidism, unspecified: Secondary | ICD-10-CM | POA: Insufficient documentation

## 2017-02-24 DIAGNOSIS — Y92129 Unspecified place in nursing home as the place of occurrence of the external cause: Secondary | ICD-10-CM | POA: Insufficient documentation

## 2017-02-24 DIAGNOSIS — Z79899 Other long term (current) drug therapy: Secondary | ICD-10-CM | POA: Diagnosis not present

## 2017-02-24 DIAGNOSIS — I2510 Atherosclerotic heart disease of native coronary artery without angina pectoris: Secondary | ICD-10-CM | POA: Insufficient documentation

## 2017-02-24 DIAGNOSIS — Y9389 Activity, other specified: Secondary | ICD-10-CM | POA: Insufficient documentation

## 2017-02-24 DIAGNOSIS — I10 Essential (primary) hypertension: Secondary | ICD-10-CM | POA: Insufficient documentation

## 2017-02-24 NOTE — ED Triage Notes (Signed)
Pt arrived via EMS from [facility], pt had unwitnessed fall about 30 minutes prior to arrival. EMS reports staff came into room when they heard pt yell, Pt was assisted off the floor prior to EMS arrival. Pt is alert and oriented x 4.  Pt has chronic back pain and is wearing Fentanyl patch to left shoulder.

## 2017-02-24 NOTE — ED Notes (Signed)
Pt back from CT

## 2017-02-24 NOTE — ED Provider Notes (Signed)
[facility] Emergency Department Provider Note  ____________________________________________   First MD Initiated Contact with Patient 02/24/17 1046     (approximate)  I have reviewed the triage vital signs and the nursing notes.   HISTORY  Chief Complaint Fall    HPI Angela Howe is a 83 y.o. female who is brought to the emergency department via EMS after a fall at her nursing home.  The patient is normally not able to ambulate on her own and requires assistance.  She said this morning she had to urinate so she pressed her call bell.  No one responded so she pressed the call bell again and again no one responded.  At that point she got up to walk to the bathroom on her own she felt weak and her legs gave out and she fell to the ground.  She struck the back of her head.  She did vomit several times thereafter.  She is currently in no pain.  Her symptoms began suddenly and resolved quickly.  They are worsened with walking and improved by not walking.   Past Medical History:  Diagnosis Date  . Breast cancer ([place])   . CAD (coronary artery disease)   . Degenerative disc disease   . GERD (gastroesophageal reflux disease)   . Hyperlipidemia    Previous intolerance of statins  . Hypothyroidism   . Orthostatic hypotension   . Osteoarthritis   . Peptic ulcer disease   . Syncope     Patient Active Problem List   Diagnosis Date Noted  . Subacute delirium 02/21/2017  . Palliative care by specialist   . Advance care planning   . Goals of care, counseling/discussion   . Dehydration 02/20/2017  . Pancolitis ([place]) 12/21/2016  . Unsteady gait 08/13/2016  . Falls frequently 07/04/2016  . Chest pain 10/12/2015  . Rib fracture 10/03/2015  . Right carotid bruit 08/01/2015  . Loss of weight 01/12/2015  . Anemia 07/18/2014  . Health care maintenance 07/18/2014  . Hoarseness 07/18/2014  . Abdominal pain 05/05/2014  . Back pain 10/05/2013  . Knee pain  07/14/2013  . Oral pain 05/06/2013  . Itching 01/05/2013  . History of breast cancer 11/20/2012  . Syncope 11/20/2012  . Hypothyroidism 08/10/2012  . Difficulty sleeping 08/10/2012  . Dizziness 11/08/2010  . Hyperlipemia 03/23/2009  . HYPERTENSION, BENIGN 03/23/2009  . CAD, NATIVE VESSEL 09/24/2008  . ORTHOSTATIC HYPOTENSION 09/24/2008  . ABNORMAL EKG 09/24/2008    Past Surgical History:  Procedure Laterality Date  . ABDOMINAL HYSTERECTOMY  1970  . APPENDECTOMY    . BIOPSY BREAST  2014  . BREAST EXCISIONAL BIOPSY Left 2014   neg  . BREAST EXCISIONAL BIOPSY Left 2006   postive radation  . BREAST LUMPECTOMY  2006  . CHOLECYSTECTOMY    . CORONARY ARTERY BYPASS GRAFT  05/2006   x3  . NASAL SINUS SURGERY    . ROTATOR CUFF REPAIR      Prior to Admission medications   Medication Sig Start Date End Date Taking? Authorizing Provider  acetaminophen (TYLENOL) 325 MG tablet Take 2 tablets (650 mg total) by mouth every 6 (six) hours as needed for mild pain (or Fever >/= 101). 02/23/17  Yes Dustin Flock, MD  amitriptyline (ELAVIL) 25 MG tablet Take 1 tablet (25 mg total) by mouth at bedtime. 02/23/17  Yes Dustin Flock, MD  aspirin EC 81 MG tablet Take 81 mg by mouth daily.   Yes [provider]  atorvastatin (LIPITOR)  20 MG tablet Take 1 tablet (20 mg total) by mouth daily. 08/08/16  Yes Einar Pheasant, MD  calcium-vitamin D (OSCAL WITH D) 500-200 MG-UNIT tablet Take 2 tablets by mouth 2 (two) times daily. 02/23/17  Yes Dustin Flock, MD  citalopram (CELEXA) 20 MG tablet Take 1 tablet (20 mg total) by mouth daily. 08/08/16  Yes Einar Pheasant, MD  docusate (COLACE) 50 MG/5ML liquid Take 5 mLs (50 mg total) by mouth 2 (two) times daily. 02/23/17  Yes Dustin Flock, MD  fentaNYL (DURAGESIC - DOSED MCG/HR) 12 MCG/HR Place 1 patch (12.5 mcg total) onto the skin every 3 (three) days. 02/23/17  Yes Dustin Flock, MD  fludrocortisone (FLORINEF) 0.1 MG tablet Take 1 tablet  (0.1 mg total) by mouth daily. 03/22/15  Yes Einar Pheasant, MD  lansoprazole (PREVACID) 30 MG capsule Take 30 mg by mouth daily.     Yes [provider]  levothyroxine (SYNTHROID, LEVOTHROID) 50 MCG tablet Take 1 tablet (50 mcg total) by mouth daily. 08/08/16  Yes Einar Pheasant, MD  lidocaine (LIDODERM) 5 % Place 2 patches onto the skin daily. Remove & Discard patch within 12 hours or as directed by MD 02/23/17  Yes Dustin Flock, MD  megestrol (MEGACE ES) 625 MG/5ML suspension Take 5 mLs (625 mg total) by mouth daily. 02/23/17  Yes Dustin Flock, MD  Multiple Vitamin (MULTIVITAMIN WITH MINERALS) TABS tablet Take 1 tablet by mouth daily. 02/23/17  Yes Dustin Flock, MD  Vitamin D, Ergocalciferol, (DRISDOL) 50000 units CAPS capsule Take 1 capsule (50,000 Units total) by mouth every 7 (seven) days. 02/23/17 03/17/17 Yes Dustin Flock, MD  nitroGLYCERIN (NITROSTAT) 0.4 MG SL tablet Place 1 tablet (0.4 mg total) under the tongue every 5 (five) minutes as needed for chest pain. 07/10/14   Einar Pheasant, MD    Allergies Alendronate sodium and Prednisone  Family History  Problem Relation Age of Onset  . Heart attack Sister   . Cancer Brother   . Pancreatic cancer Brother   . Ovarian cancer Sister     Social History Social History  Substance Use Topics  . Smoking status: Never Smoker  . Smokeless tobacco: Never Used  . Alcohol use No    Review of Systems Constitutional: No fever/chills Eyes: No visual changes. ENT: No sore throat. Cardiovascular: Denies chest pain. Respiratory: Denies shortness of breath. Gastrointestinal: No abdominal pain.  Positive for nausea, positive for vomiting.  No diarrhea.  No constipation. Genitourinary: Negative for dysuria. Musculoskeletal: Negative for back pain. Skin: Positive for scratch Neurological: Positive for headache   ____________________________________________   PHYSICAL EXAM:  VITAL SIGNS: ED Triage Vitals  Enc  Vitals Group     BP      Pulse      Resp      Temp      Temp src      SpO2      Weight      Height      Head Circumference      Peak Flow      Pain Score      Pain Loc      Pain Edu?      Excl. in [place]?     Constitutional: Alert and oriented x4 joking laughing very well-appearing nontoxic no diaphoresis except for present Eyes: PERRL EOMI. Head: Slight abrasion to left occipital. Nose: No congestion/rhinnorhea. Mouth/Throat: No trismus Neck: No stridor.  No midline tenderness Cardiovascular: Normal rate, regular rhythm. Grossly normal heart sounds.  Good peripheral circulation. Respiratory:  Normal respiratory effort.  No retractions. Lungs CTAB and moving good air Gastrointestinal: Soft nontender Musculoskeletal: No lower extremity edema   Neurologic:  Normal speech and language. No gross focal neurologic deficits are appreciated. Skin:  Skin is warm, dry and intact. No rash noted. Psychiatric: Mood and affect are normal. Speech and behavior are normal.    ____________________________________________   DIFFERENTIAL includes but not limited to  Intracerebral hemorrhage, skull fracture, syncope, mechanical fall ____________________________________________   LABS (all labs ordered are listed, but only abnormal results are displayed)  Labs Reviewed - No data to display   __________________________________________  EKG   ____________________________________________  RADIOLOGY  Head CT reviewed by me shows no acute disease ____________________________________________   PROCEDURES  Procedure(s) performed: no  Procedures  Critical Care performed: no  Observation: no ____________________________________________   INITIAL IMPRESSION / ASSESSMENT AND PLAN / ED COURSE  Pertinent labs & imaging results that were available during my care of the patient were reviewed by me and considered in my medical decision making (see chart for details).  Patient arrives  joking laughing very well-appearing.  She has no midline tenderness or step-offs and she is neurologically intact.  She gives a clear history of not actually passing out or syncopized them.  She had no antecedent chest pain or palpitations.  Head CT is pending.     Fortunately the patient's head CT is negative for acute pathology.  She remains neuro intact.  At this point she has no acute medical issues is medically stable for return to her nursing home.  She verbalized understanding and agree with the plan. ____________________________________________   FINAL CLINICAL IMPRESSION(S) / ED DIAGNOSES  Final diagnoses:  Minor head injury, initial encounter  Fall, initial encounter      NEW MEDICATIONS STARTED DURING THIS VISIT:  New Prescriptions   No medications on file     Note:  This document was prepared using Dragon voice recognition software and may include unintentional dictation errors.     Darel Hong, MD 02/24/17 1236

## 2017-02-24 NOTE — ED Notes (Signed)
Called daughter and notified of d/c back to [facility]. Pending EMS transport.

## 2017-02-24 NOTE — ED Notes (Signed)
[place] from facility states unable to provide transportation back to facility. Family states unable to transport back to facility because pt is not ambulatory.

## 2017-02-24 NOTE — Discharge Instructions (Signed)
Fortunately today your CT scan of your head was very normal and you are not bleeding and you did not break any bones.  Please follow-up with your primary care physician as needed and return to the emergency department for any concerns.  It was a pleasure to take care of you today, and thank you for coming to our emergency department.  If you have any questions or concerns before leaving please ask the nurse to grab me and I'm more than happy to go through your aftercare instructions again.  If you were prescribed any opioid pain medication today such as Norco, Vicodin, Percocet, morphine, hydrocodone, or oxycodone please make sure you do not drive when you are taking this medication as it can alter your ability to drive safely.  If you have any concerns once you are home that you are not improving or are in fact getting worse before you can make it to your follow-up appointment, please do not hesitate to call 911 and come back for further evaluation.  Darel Hong, MD  Results for orders placed or performed during the hospital encounter of 02/20/17  Urine Culture  Result Value Ref Range   Specimen Description URINE, CATHETERIZED    Special Requests Normal    Culture      NO GROWTH Performed at [facility], [address] [place], [place] [address]    Report Status 02/21/2017 FINAL   CBC with Differential/Platelet  Result Value Ref Range   WBC 15.3 (H) 3.6 - 11.0 K/uL   RBC 3.03 (L) 3.80 - 5.20 MIL/uL   Hemoglobin 9.0 (L) 12.0 - 16.0 g/dL   HCT 27.4 (L) 35.0 - 47.0 %   MCV 90.5 80.0 - 100.0 fL   MCH 29.7 26.0 - 34.0 pg   MCHC 32.9 32.0 - 36.0 g/dL   RDW 14.6 (H) 11.5 - 14.5 %   Platelets 450 (H) 150 - 440 K/uL   Neutrophils Relative % 88 %   Neutro Abs 13.4 (H) 1.4 - 6.5 K/uL   Lymphocytes Relative 9 %   Lymphs Abs 1.4 1.0 - 3.6 K/uL   Monocytes Relative 3 %   Monocytes Absolute 0.5 0.2 - 0.9 K/uL   Eosinophils Relative 0 %   Eosinophils Absolute 0.0 0 - 0.7 K/uL   Basophils Relative 0 %   Basophils Absolute 0.0 0 - 0.1 K/uL  Basic metabolic panel  Result Value Ref Range   Sodium 140 135 - 145 mmol/L   Potassium 4.5 3.5 - 5.1 mmol/L   Chloride 109 101 - 111 mmol/L   CO2 21 (L) 22 - 32 mmol/L   Glucose, Bld 116 (H) 65 - 99 mg/dL   BUN 58 (H) 6 - 20 mg/dL   Creatinine, Ser 0.92 0.44 - 1.00 mg/dL   Calcium 8.8 (L) 8.9 - 10.3 mg/dL   GFR calc non Af Amer 54 (L) >60 mL/min   GFR calc Af Amer >60 >60 mL/min   Anion gap 10 5 - 15  Troponin I  Result Value Ref Range   Troponin I <0.03 <0.03 ng/mL  Magnesium  Result Value Ref Range   Magnesium 2.2 1.7 - 2.4 mg/dL  Urinalysis, Routine w reflex microscopic  Result Value Ref Range   Color, Urine YELLOW (A) YELLOW   APPearance CLEAR (A) CLEAR   Specific Gravity, Urine 1.029 1.005 - 1.030   pH 5.0 5.0 - 8.0   Glucose, UA NEGATIVE NEGATIVE mg/dL   Hgb urine dipstick NEGATIVE NEGATIVE  Bilirubin Urine NEGATIVE NEGATIVE   Ketones, ur 5 (A) NEGATIVE mg/dL   Protein, ur NEGATIVE NEGATIVE mg/dL   Nitrite NEGATIVE NEGATIVE   Leukocytes, UA NEGATIVE NEGATIVE  Basic metabolic panel  Result Value Ref Range   Sodium 142 135 - 145 mmol/L   Potassium 4.0 3.5 - 5.1 mmol/L   Chloride 116 (H) 101 - 111 mmol/L   CO2 20 (L) 22 - 32 mmol/L   Glucose, Bld 89 65 - 99 mg/dL   BUN 54 (H) 6 - 20 mg/dL   Creatinine, Ser 1.08 (H) 0.44 - 1.00 mg/dL   Calcium 7.9 (L) 8.9 - 10.3 mg/dL   GFR calc non Af Amer 44 (L) >60 mL/min   GFR calc Af Amer 51 (L) >60 mL/min   Anion gap 6 5 - 15  CBC  Result Value Ref Range   WBC 13.1 (H) 3.6 - 11.0 K/uL   RBC 2.29 (L) 3.80 - 5.20 MIL/uL   Hemoglobin 7.0 (L) 12.0 - 16.0 g/dL   HCT 20.9 (L) 35.0 - 47.0 %   MCV 91.1 80.0 - 100.0 fL   MCH 30.4 26.0 - 34.0 pg   MCHC 33.4 32.0 - 36.0 g/dL   RDW 14.5 11.5 - 14.5 %   Platelets 334 150 - 440 K/uL  Vitamin B12  Result Value Ref Range   Vitamin B-12 654 180 - 914 pg/mL  Folate  Result Value Ref Range   Folate 24.0 >5.9 ng/mL    Iron and TIBC  Result Value Ref Range   Iron 37 28 - 170 ug/dL   TIBC 205 (L) 250 - 450 ug/dL   Saturation Ratios 18 10.4 - 31.8 %   UIBC 168 ug/dL  Ferritin  Result Value Ref Range   Ferritin 83 11 - 307 ng/mL  Reticulocytes  Result Value Ref Range   Retic Ct Pct 3.0 0.4 - 3.1 %   RBC. 2.26 (L) 3.80 - 5.20 MIL/uL   Retic Count, Absolute 67.8 19.0 - 183.0 K/uL  CBC  Result Value Ref Range   WBC 11.2 (H) 3.6 - 11.0 K/uL   RBC 2.91 (L) 3.80 - 5.20 MIL/uL   Hemoglobin 9.1 (L) 12.0 - 16.0 g/dL   HCT 27.3 (L) 35.0 - 47.0 %   MCV 93.8 80.0 - 100.0 fL   MCH 31.3 26.0 - 34.0 pg   MCHC 33.3 32.0 - 36.0 g/dL   RDW 16.1 (H) 11.5 - 14.5 %   Platelets 331 150 - 440 K/uL  Basic metabolic panel  Result Value Ref Range   Sodium 142 135 - 145 mmol/L   Potassium 3.8 3.5 - 5.1 mmol/L   Chloride 113 (H) 101 - 111 mmol/L   CO2 21 (L) 22 - 32 mmol/L   Glucose, Bld 98 65 - 99 mg/dL   BUN 34 (H) 6 - 20 mg/dL   Creatinine, Ser 1.02 (H) 0.44 - 1.00 mg/dL   Calcium 8.2 (L) 8.9 - 10.3 mg/dL   GFR calc non Af Amer 47 (L) >60 mL/min   GFR calc Af Amer 55 (L) >60 mL/min   Anion gap 8 5 - 15  VITAMIN D 25 Hydroxy (Vit-D Deficiency, Fractures)  Result Value Ref Range   Vit D, 25-Hydroxy 9.9 (L) 30.0 - 100.0 ng/mL  Type and screen [place]  Result Value Ref Range   ABO/RH(D) A POS    Antibody Screen NEG    Sample Expiration 02/24/2017    Unit Number [id number]  Blood Component Type RED CELLS,LR    Unit division 00    Status of Unit ISSUED,FINAL    Transfusion Status OK TO TRANSFUSE    Crossmatch Result Compatible   Prepare RBC  Result Value Ref Range   Order Confirmation ORDER PROCESSED BY BLOOD BANK   ABO/Rh  Result Value Ref Range   ABO/RH(D) A POS   BPAM RBC  Result Value Ref Range   ISSUE DATE / TIME [id number]    Blood Product Unit Number [id number]    PRODUCT CODE [id number]    Unit Type and Rh 4696    Blood Product Expiration Date [id number]     Dg Ribs Unilateral W/chest Left  Result Date: 02/14/2017 CLINICAL DATA:  Recent fall.  Chest pain EXAM: LEFT RIBS AND CHEST - 3+ VIEW COMPARISON:  10/12/2015 FINDINGS: CABG. Negative for heart failure. Lungs are clear without infiltrate effusion or pneumothorax Negative for left rib fracture. IMPRESSION: Negative. Electronically Signed   By: Franchot Gallo M.D.   On: 02/14/2017 18:43   Dg Thoracic Spine 2 View  Result Date: 02/12/2017 CLINICAL DATA:  Multiple falls most recently this morning. Patient reports upper and lower back pain. EXAM: THORACIC SPINE 2 VIEWS COMPARISON:  Thoracic spine series of January 07, 2016 FINDINGS: The patient has undergone previous kyphoplasty in the lower lumbar spine. There are compression fractures of T8, T9, and T11 which are stable. There is mild anterior wedging of the bodies of T5 and T2. The T2 compression appears new. There are no abnormal paravertebral soft tissue densities. The pedicles appear intact. IMPRESSION: Stable compressions of T5, T8, T9, and T11. The latter 3 are high-grade. There is new mild anterior compression of the body of T10. Electronically Signed   By: David  Martinique M.D.   On: 02/12/2017 12:51   Dg Lumbar Spine 2-3 Views  Result Date: 02/12/2017 CLINICAL DATA:  Multiple falls, most recent this morning. Back pain. EXAM: LUMBAR SPINE - 2-3 VIEW COMPARISON:  12/21/2016 CT abdomen/ pelvis. FINDINGS: This report assumes 5 non rib-bearing lumbar vertebrae. Diffuse osteopenia. Severe T8 vertebral compression fracture, chronic, with interval height loss since 10/12/2015 chest CT. Severe T9 vertebral compression fracture, chronic, with interval height loss since 10/12/2015 chest CT. Severe T11 vertebral compression fracture, chronic, stable since 10/12/2015 chest CT. Moderate L3 vertebral compression fracture status post vertebroplasty, stable since 12/21/2016 CT abdomen/pelvis study. Moderate to severe L4 vertebral compression fracture, stable  since 12/21/2016 CT. Remaining visualized thoracolumbar vertebral body heights are preserved with no acute fracture. Moderate to severe multilevel degenerative disc disease in the visualized thoracolumbar spine, most prominent at L2-3 and L3-4, not appreciably changed. No spondylolisthesis. Mild facet arthropathy bilaterally in the lower lumbar spine. No aggressive appearing focal osseous lesions. Abdominal aortic atherosclerosis. Visualized lower sternotomy wires appear intact. Cholecystectomy clips are seen in the right upper quadrant of the abdomen. IMPRESSION: 1. No acute vertebral compression fracture in the visualized thoracolumbar spine . 2. Multiple chronic vertebral compression fractures in the visualized thoracolumbar spine, some of which demonstrate interval worsening loss of vertebral body height as detailed. 3. No spondylolisthesis. 4. Moderate to severe multilevel degenerative disc disease. Electronically Signed   By: Ilona Sorrel M.D.   On: 02/12/2017 12:53   Ct Head Wo Contrast  Result Date: 02/24/2017 CLINICAL DATA:  Unwitnessed fall.  Found down. EXAM: CT HEAD WITHOUT CONTRAST TECHNIQUE: Contiguous axial images were obtained from the base of the skull through the vertex without intravenous contrast. COMPARISON:  Head CT dated 02/12/2017. FINDINGS:  Brain: There is mild generalized age related parenchymal atrophy with commensurate dilatation of the ventricles and sulci. Ventricles are stable in size and configuration. Chronic small vessel ischemic changes again noted within the bilateral periventricular white matter regions. There is no mass, hemorrhage, edema or other evidence of acute parenchymal abnormality. No extra-axial hemorrhage. Vascular: There are chronic calcified atherosclerotic changes of the large vessels at the skull base. No unexpected hyperdense vessel. Skull: Normal. Negative for fracture or focal lesion. Sinuses/Orbits: No acute finding. Other: None. IMPRESSION: 1. No acute  findings. No intracranial mass, hemorrhage or edema. No skull fracture. 2. Atrophy and chronic small vessel ischemic changes in the white matter, stable. Electronically Signed   By: Franki Cabot M.D.   On: 02/24/2017 11:58   Ct Head Wo Contrast  Result Date: 02/12/2017 CLINICAL DATA:  Mechanical fall this morning. Head injury on door knob. Initial encounter. EXAM: CT HEAD WITHOUT CONTRAST TECHNIQUE: Contiguous axial images were obtained from the base of the skull through the vertex without intravenous contrast. COMPARISON:  10/01/2015 FINDINGS: Brain: No evidence of acute infarction, hemorrhage, hydrocephalus, extra-axial collection or mass lesion/mass effect. Generalized atrophy and confluent chronic small vessel ischemia in the periventricular white matter. Remote lacunar infarct in the left caudate head. Vascular: Atherosclerotic calcification.  No hyperdense vessel. Skull: Left parietal scalp laceration without fracture. Sinuses/Orbits: No evidence of injury. Bilateral cataract resection. IMPRESSION: 1. No evidence of intracranial injury. 2. Left parietal scalp laceration without fracture. Electronically Signed   By: Monte Fantasia M.D.   On: 02/12/2017 12:59   Ct Chest W Contrast  Result Date: 02/21/2017 CLINICAL DATA:  None localized abdominal pain and unintended weight loss. Adult failure to thrive. Personal history of breast carcinoma. EXAM: CT CHEST, ABDOMEN, AND PELVIS WITH CONTRAST TECHNIQUE: Multidetector CT imaging of the chest, abdomen and pelvis was performed following the standard protocol during bolus administration of intravenous contrast. CONTRAST:  47mL ISOVUE-300 IOPAMIDOL (ISOVUE-300) INJECTION 61% COMPARISON:  Chest CT on 10/12/2015 and AP CT on 02/14/2017 FINDINGS: CT CHEST FINDINGS Cardiovascular: No acute findings. Aortic and coronary artery atherosclerosis. Prior CABG. Mediastinum/Lymph Nodes: No masses or pathologically enlarged lymph nodes identified. Stable post lumpectomy  changes in left breast. Lungs/Pleura: No pulmonary infiltrate or mass identified. Mild bibasilar scarring. No effusion present. Musculoskeletal: Stable compression fractures of T6, T8, T9, and T11 vertebral bodies. CT ABDOMEN AND PELVIS FINDINGS Hepatobiliary: No masses identified. Prior cholecystectomy. No evidence of biliary obstruction. Pancreas:  No mass or inflammatory changes. Spleen:  Within normal limits in size and appearance. Adrenals/Urinary tract: No masses or hydronephrosis. Stable small left renal cyst. Stomach/Bowel: Stable small hiatal hernia. Stable gastric antrum wall thickening with gas containing ulcer in the superior wall. No evidence free intraperitoneal air. Sigmoid diverticulosis, without evidence of diverticulitis . No evidence of bowel obstruction. Vascular/Lymphatic: No pathologically enlarged lymph nodes identified. No abdominal aortic aneurysm. Aortic atherosclerosis. Reproductive: Prior hysterectomy noted. Adnexal regions are unremarkable in appearance. Other:  None. Musculoskeletal: No suspicious bone lesions identified. Previous L3 and L4 vertebroplasties. IMPRESSION: No significant change in appearance of gastric antral ulcer. No evidence of perforation or other complication. Stable small hiatal hernia. Colonic diverticulosis, without radiographic evidence of diverticulitis. Aortic and coronary artery atherosclerosis. Old thoracic and lumbar vertebral compression fracture deformities. Previous L3 and L4 vertebroplasties. Electronically Signed   By: Earle Gell M.D.   On: 02/21/2017 12:53   Ct Abdomen Pelvis W Contrast  Result Date: 02/21/2017 CLINICAL DATA:  None localized abdominal pain and unintended weight loss. Adult  failure to thrive. Personal history of breast carcinoma. EXAM: CT CHEST, ABDOMEN, AND PELVIS WITH CONTRAST TECHNIQUE: Multidetector CT imaging of the chest, abdomen and pelvis was performed following the standard protocol during bolus administration of  intravenous contrast. CONTRAST:  98mL ISOVUE-300 IOPAMIDOL (ISOVUE-300) INJECTION 61% COMPARISON:  Chest CT on 10/12/2015 and AP CT on 02/14/2017 FINDINGS: CT CHEST FINDINGS Cardiovascular: No acute findings. Aortic and coronary artery atherosclerosis. Prior CABG. Mediastinum/Lymph Nodes: No masses or pathologically enlarged lymph nodes identified. Stable post lumpectomy changes in left breast. Lungs/Pleura: No pulmonary infiltrate or mass identified. Mild bibasilar scarring. No effusion present. Musculoskeletal: Stable compression fractures of T6, T8, T9, and T11 vertebral bodies. CT ABDOMEN AND PELVIS FINDINGS Hepatobiliary: No masses identified. Prior cholecystectomy. No evidence of biliary obstruction. Pancreas:  No mass or inflammatory changes. Spleen:  Within normal limits in size and appearance. Adrenals/Urinary tract: No masses or hydronephrosis. Stable small left renal cyst. Stomach/Bowel: Stable small hiatal hernia. Stable gastric antrum wall thickening with gas containing ulcer in the superior wall. No evidence free intraperitoneal air. Sigmoid diverticulosis, without evidence of diverticulitis . No evidence of bowel obstruction. Vascular/Lymphatic: No pathologically enlarged lymph nodes identified. No abdominal aortic aneurysm. Aortic atherosclerosis. Reproductive: Prior hysterectomy noted. Adnexal regions are unremarkable in appearance. Other:  None. Musculoskeletal: No suspicious bone lesions identified. Previous L3 and L4 vertebroplasties. IMPRESSION: No significant change in appearance of gastric antral ulcer. No evidence of perforation or other complication. Stable small hiatal hernia. Colonic diverticulosis, without radiographic evidence of diverticulitis. Aortic and coronary artery atherosclerosis. Old thoracic and lumbar vertebral compression fracture deformities. Previous L3 and L4 vertebroplasties. Electronically Signed   By: Earle Gell M.D.   On: 02/21/2017 12:53   Ct Abdomen Pelvis W  Contrast  Result Date: 02/14/2017 CLINICAL DATA:  Recurrent falls. Known compression fracture. LEFT flank pain. History of breast cancer, peptic ulcer disease, appendectomy, hysterectomy and cholecystectomy. EXAM: CT ABDOMEN AND PELVIS WITH CONTRAST TECHNIQUE: Multidetector CT imaging of the abdomen and pelvis was performed using the standard protocol following bolus administration of intravenous contrast. CONTRAST:  10mL ISOVUE-300 IOPAMIDOL (ISOVUE-300) INJECTION 61% COMPARISON:  CT abdomen and pelvis December 21, 2016 and lumbar spine radiographs February 12, 2017. Mammogram report January 25, 2016. FINDINGS: LOWER CHEST: Lung bases are clear. Included heart size is normal. Mitral annular calcifications. No pericardial effusion. Status post median sternotomy. HEPATOBILIARY: Scattered hepatic granulomas, liver is otherwise unremarkable. Status post cholecystectomy. PANCREAS: Atrophic, nonacute. SPLEEN: Normal. ADRENALS/URINARY TRACT: Kidneys are orthotopic, demonstrating symmetric enhancement. No nephrolithiasis, hydronephrosis or solid renal masses. Mild LEFT pelvicaliectasis. Multiple homogeneously hypodense benign-appearing LEFT renal cysts measuring to 2.5 cm. The unopacified ureters are normal in course and caliber. Delayed imaging through the kidneys demonstrates symmetric prompt contrast excretion within the proximal urinary collecting system. Urinary bladder is well distended and unremarkable. Normal adrenal glands. STOMACH/BOWEL: Small hiatal hernia. Severe sigmoid diverticulosis. No significant residual colonic bowel wall thickening though sensitivity decreased without oral contrast. Thickened, edematous gastric antrum on proximal duodenum. 1.2 cm gastric antral ulcer superior wall. VASCULAR/LYMPHATIC: Aortoiliac vessels are normal in course and caliber. Moderate to severe calcific atherosclerosis . No lymphadenopathy by CT size criteria. REPRODUCTIVE: Status post hysterectomy. OTHER: No  intraperitoneal free fluid or free air. MUSCULOSKELETAL: Nonacute. Osteopenia. Multiple Tarlov cysts. Old T11 vertebral plana. Old mild-to-moderate L5 compression fracture. Status post L3 and L4 vertebral body cement augmentation. 17 mm partially calcified LEFT breast mass. Severe degenerative change of the RIGHT hip. IMPRESSION: 1. Mild gastritis with intact antral ulcer. 2. Severe sigmoid  diverticulosis, resolved pancolitis. 3. 17 mm partially calcified LEFT breast mass, not reported on prior mammogram. Recommend follow-up mammogram and ultrasound. Aortic Atherosclerosis (ICD10-I70.0). Electronically Signed   By: Elon Alas M.D.   On: 02/14/2017 22:30   Dg Chest Portable 1 View  Result Date: 02/20/2017 CLINICAL DATA:  83 year old female with weakness. Recent fall. Subsequent encounter. EXAM: PORTABLE CHEST 1 VIEW COMPARISON:  02/14/2017. FINDINGS: No infiltrate, congestive heart failure or pneumothorax. Skin fold on the left. Post median sternotomy/ CABG.  Heart size within normal limits. Calcified slightly tortuous aorta. Prior right shoulder surgery. CT detected T11 compression fracture not well delineated by plain film exam. IMPRESSION: No acute pulmonary abnormality. Aortic Atherosclerosis (ICD10-I70.0). Electronically Signed   By: Genia Del M.D.   On: 02/20/2017 12:12   Dg Knee Complete 4 Views Right  Result Date: 02/14/2017 CLINICAL DATA:  Right knee pain after fall 2 days ago. EXAM: RIGHT KNEE - COMPLETE 4+ VIEW COMPARISON:  None. FINDINGS: Femorotibial and patellofemoral joint space narrowing is noted with mild chondrocalcinosis. No acute fracture or joint dislocation. No joint effusion. Surgical clips are seen in the popliteal fossa. IMPRESSION: 1. Mild tricompartmental joint space narrowing of the right knee. 2. No acute fracture nor joint dislocation.  No joint effusion Electronically Signed   By: Ashley Royalty M.D.   On: 02/14/2017 18:44

## 2017-02-24 NOTE — ED Notes (Signed)
Family left prior to signing of d/c instructions. Talked to daughter on phone and verbally acknowledged instruction. D/C with EMS to SNF.

## 2017-02-26 DIAGNOSIS — D638 Anemia in other chronic diseases classified elsewhere: Secondary | ICD-10-CM | POA: Diagnosis not present

## 2017-02-26 DIAGNOSIS — M8000XD Age-related osteoporosis with current pathological fracture, unspecified site, subsequent encounter for fracture with routine healing: Secondary | ICD-10-CM | POA: Diagnosis not present

## 2017-02-26 DIAGNOSIS — G9340 Encephalopathy, unspecified: Secondary | ICD-10-CM | POA: Diagnosis not present

## 2017-03-12 ENCOUNTER — Other Ambulatory Visit: Payer: Self-pay | Admitting: *Deleted

## 2017-03-12 NOTE — Patient Outreach (Signed)
[facility]) Care Management  03/12/2017  Keianna Signer 1925/09/06 [id number]   Met with Milana Kidney, SW at facility.  She reports that patient plan is to discharge home with [facility] on 11/15.  Plan to sign off as no [facility] care management needs assessed at this time. Royetta Crochet. Laymond Purser, RN, BSN, [place] [phone]) Business Cell  [phone]) Toll Free Office

## 2017-03-29 DIAGNOSIS — R627 Adult failure to thrive: Secondary | ICD-10-CM | POA: Diagnosis not present

## 2017-03-29 DIAGNOSIS — I2510 Atherosclerotic heart disease of native coronary artery without angina pectoris: Secondary | ICD-10-CM | POA: Diagnosis not present

## 2017-03-30 ENCOUNTER — Ambulatory Visit: Payer: Medicare Other | Admitting: Internal Medicine

## 2017-04-17 ENCOUNTER — Inpatient Hospital Stay
Admission: EM | Admit: 2017-04-17 | Discharge: 2017-04-20 | DRG: 482 | Disposition: A | Discharge status: Hospice | Attending: Internal Medicine | Admitting: Internal Medicine

## 2017-04-17 ENCOUNTER — Emergency Department

## 2017-04-17 DIAGNOSIS — Z9889 Other specified postprocedural states: Secondary | ICD-10-CM

## 2017-04-17 DIAGNOSIS — Z951 Presence of aortocoronary bypass graft: Secondary | ICD-10-CM

## 2017-04-17 DIAGNOSIS — Z8041 Family history of malignant neoplasm of ovary: Secondary | ICD-10-CM

## 2017-04-17 DIAGNOSIS — M545 Low back pain: Secondary | ICD-10-CM | POA: Diagnosis present

## 2017-04-17 DIAGNOSIS — S0003XA Contusion of scalp, initial encounter: Secondary | ICD-10-CM | POA: Diagnosis present

## 2017-04-17 DIAGNOSIS — Z853 Personal history of malignant neoplasm of breast: Secondary | ICD-10-CM

## 2017-04-17 DIAGNOSIS — Z7401 Bed confinement status: Secondary | ICD-10-CM | POA: Diagnosis not present

## 2017-04-17 DIAGNOSIS — Z66 Do not resuscitate: Secondary | ICD-10-CM | POA: Diagnosis present

## 2017-04-17 DIAGNOSIS — Z9181 History of falling: Secondary | ICD-10-CM | POA: Diagnosis not present

## 2017-04-17 DIAGNOSIS — K219 Gastro-esophageal reflux disease without esophagitis: Secondary | ICD-10-CM | POA: Diagnosis present

## 2017-04-17 DIAGNOSIS — Y92129 Unspecified place in nursing home as the place of occurrence of the external cause: Secondary | ICD-10-CM | POA: Diagnosis not present

## 2017-04-17 DIAGNOSIS — S72009A Fracture of unspecified part of neck of unspecified femur, initial encounter for closed fracture: Secondary | ICD-10-CM | POA: Diagnosis present

## 2017-04-17 DIAGNOSIS — E785 Hyperlipidemia, unspecified: Secondary | ICD-10-CM | POA: Diagnosis present

## 2017-04-17 DIAGNOSIS — E039 Hypothyroidism, unspecified: Secondary | ICD-10-CM | POA: Diagnosis present

## 2017-04-17 DIAGNOSIS — D638 Anemia in other chronic diseases classified elsewhere: Secondary | ICD-10-CM | POA: Diagnosis present

## 2017-04-17 DIAGNOSIS — Z7982 Long term (current) use of aspirin: Secondary | ICD-10-CM

## 2017-04-17 DIAGNOSIS — S72142A Displaced intertrochanteric fracture of left femur, initial encounter for closed fracture: Secondary | ICD-10-CM | POA: Diagnosis not present

## 2017-04-17 DIAGNOSIS — G8929 Other chronic pain: Secondary | ICD-10-CM | POA: Diagnosis present

## 2017-04-17 DIAGNOSIS — I2510 Atherosclerotic heart disease of native coronary artery without angina pectoris: Secondary | ICD-10-CM | POA: Diagnosis present

## 2017-04-17 DIAGNOSIS — R011 Cardiac murmur, unspecified: Secondary | ICD-10-CM | POA: Diagnosis present

## 2017-04-17 DIAGNOSIS — Z79899 Other long term (current) drug therapy: Secondary | ICD-10-CM

## 2017-04-17 DIAGNOSIS — Z888 Allergy status to other drugs, medicaments and biological substances status: Secondary | ICD-10-CM

## 2017-04-17 DIAGNOSIS — M25552 Pain in left hip: Secondary | ICD-10-CM | POA: Diagnosis not present

## 2017-04-17 DIAGNOSIS — Z993 Dependence on wheelchair: Secondary | ICD-10-CM | POA: Diagnosis not present

## 2017-04-17 DIAGNOSIS — S72002A Fracture of unspecified part of neck of left femur, initial encounter for closed fracture: Secondary | ICD-10-CM

## 2017-04-17 DIAGNOSIS — W19XXXA Unspecified fall, initial encounter: Secondary | ICD-10-CM

## 2017-04-17 DIAGNOSIS — I10 Essential (primary) hypertension: Secondary | ICD-10-CM | POA: Diagnosis not present

## 2017-04-17 DIAGNOSIS — W050XXA Fall from non-moving wheelchair, initial encounter: Secondary | ICD-10-CM | POA: Diagnosis present

## 2017-04-17 DIAGNOSIS — R627 Adult failure to thrive: Secondary | ICD-10-CM | POA: Diagnosis present

## 2017-04-17 DIAGNOSIS — Z79891 Long term (current) use of opiate analgesic: Secondary | ICD-10-CM | POA: Diagnosis not present

## 2017-04-17 DIAGNOSIS — T148XXA Other injury of unspecified body region, initial encounter: Secondary | ICD-10-CM

## 2017-04-17 DIAGNOSIS — Z90710 Acquired absence of both cervix and uterus: Secondary | ICD-10-CM

## 2017-04-17 DIAGNOSIS — Z800 Family history of malignant neoplasm of digestive organs: Secondary | ICD-10-CM

## 2017-04-17 DIAGNOSIS — F0390 Unspecified dementia without behavioral disturbance: Secondary | ICD-10-CM | POA: Diagnosis present

## 2017-04-17 DIAGNOSIS — Z79890 Hormone replacement therapy (postmenopausal): Secondary | ICD-10-CM | POA: Diagnosis not present

## 2017-04-17 DIAGNOSIS — Z9049 Acquired absence of other specified parts of digestive tract: Secondary | ICD-10-CM | POA: Diagnosis not present

## 2017-04-17 HISTORY — DX: Unspecified dementia, unspecified severity, without behavioral disturbance, psychotic disturbance, mood disturbance, and anxiety: F03.90

## 2017-04-17 LAB — BASIC METABOLIC PANEL
Anion gap: 8 (ref 5–15)
BUN: 31 mg/dL — AB (ref 6–20)
CO2: 21 mmol/L — ABNORMAL LOW (ref 22–32)
CREATININE: 1.31 mg/dL — AB (ref 0.44–1.00)
Calcium: 8.6 mg/dL — ABNORMAL LOW (ref 8.9–10.3)
Chloride: 111 mmol/L (ref 101–111)
GFR calc non Af Amer: 35 mL/min — ABNORMAL LOW (ref >60)
GFR, EST AFRICAN AMERICAN: 41 mL/min — AB (ref >60)
Glucose, Bld: 108 mg/dL — ABNORMAL HIGH (ref 65–99)
POTASSIUM: 3.9 mmol/L (ref 3.5–5.1)
SODIUM: 140 mmol/L (ref 135–145)

## 2017-04-17 LAB — PROTIME-INR
INR: 1.12
Prothrombin Time: 14.3 seconds (ref 11.4–15.2)

## 2017-04-17 LAB — CBC
HCT: 23 % — ABNORMAL LOW (ref 35.0–47.0)
HEMOGLOBIN: 7.5 g/dL — AB (ref 12.0–16.0)
MCH: 27 pg (ref 26.0–34.0)
MCHC: 32.5 g/dL (ref 32.0–36.0)
MCV: 83.2 fL (ref 80.0–100.0)
PLATELETS: 327 10*3/uL (ref 150–440)
RBC: 2.76 MIL/uL — ABNORMAL LOW (ref 3.80–5.20)
RDW: 18.1 % — ABNORMAL HIGH (ref 11.5–14.5)
WBC: 9.2 10*3/uL (ref 3.6–11.0)

## 2017-04-17 LAB — PREPARE RBC (CROSSMATCH)

## 2017-04-17 MED ORDER — FENTANYL 12 MCG/HR TD PT72
12.5000 ug | MEDICATED_PATCH | TRANSDERMAL | Status: DC
  Administered 2017-04-19: 12.5 ug via TRANSDERMAL
  Filled 2017-04-17: qty 1

## 2017-04-17 MED ORDER — CITALOPRAM HYDROBROMIDE 20 MG PO TABS
20.0000 mg | ORAL_TABLET | Freq: Every day | ORAL | Status: DC
  Administered 2017-04-19 – 2017-04-20 (×2): 20 mg via ORAL
  Filled 2017-04-17 (×2): qty 1

## 2017-04-17 MED ORDER — PANTOPRAZOLE SODIUM 40 MG PO TBEC
40.0000 mg | DELAYED_RELEASE_TABLET | Freq: Every day | ORAL | Status: DC
  Administered 2017-04-19 – 2017-04-20 (×2): 40 mg via ORAL
  Filled 2017-04-17 (×2): qty 1

## 2017-04-17 MED ORDER — FENTANYL CITRATE (PF) 100 MCG/2ML IJ SOLN
100.0000 ug | Freq: Once | INTRAMUSCULAR | Status: AC
  Administered 2017-04-17: 100 ug via INTRAVENOUS
  Filled 2017-04-17: qty 2

## 2017-04-17 MED ORDER — SODIUM CHLORIDE 0.9 % IV SOLN
INTRAVENOUS | Status: AC
  Administered 2017-04-18: 05:00:00 via INTRAVENOUS

## 2017-04-17 MED ORDER — ONDANSETRON HCL 4 MG PO TABS: 4.0000 mg | ORAL_TABLET | Freq: Four times a day (QID) | ORAL | Status: DC | PRN

## 2017-04-17 MED ORDER — MORPHINE SULFATE (PF) 4 MG/ML IV SOLN
INTRAVENOUS | Status: AC
  Administered 2017-04-17: 4 mg
  Filled 2017-04-17: qty 1

## 2017-04-17 MED ORDER — LEVOTHYROXINE SODIUM 50 MCG PO TABS
50.0000 ug | ORAL_TABLET | Freq: Every day | ORAL | Status: DC
  Administered 2017-04-19 – 2017-04-20 (×2): 50 ug via ORAL
  Filled 2017-04-17 (×2): qty 1

## 2017-04-17 MED ORDER — POLYETHYLENE GLYCOL 3350 17 G PO PACK
17.0000 g | PACK | Freq: Every day | ORAL | Status: DC | PRN
  Administered 2017-04-19: 17 g via ORAL
  Filled 2017-04-17: qty 1

## 2017-04-17 MED ORDER — LIDOCAINE 5 % EX PTCH
2.0000 | MEDICATED_PATCH | CUTANEOUS | Status: DC
  Administered 2017-04-19 – 2017-04-20 (×2): 2 via TRANSDERMAL
  Filled 2017-04-17 (×4): qty 2

## 2017-04-17 MED ORDER — FLUDROCORTISONE ACETATE 0.1 MG PO TABS
0.1000 mg | ORAL_TABLET | Freq: Every day | ORAL | Status: DC
  Administered 2017-04-19 – 2017-04-20 (×2): 0.1 mg via ORAL
  Filled 2017-04-17 (×3): qty 1

## 2017-04-17 MED ORDER — QUETIAPINE FUMARATE 25 MG PO TABS
12.5000 mg | ORAL_TABLET | Freq: Every day | ORAL | Status: DC
  Administered 2017-04-18 – 2017-04-19 (×2): 12.5 mg via ORAL
  Filled 2017-04-17 (×3): qty 1

## 2017-04-17 MED ORDER — ADULT MULTIVITAMIN W/MINERALS CH
1.0000 | ORAL_TABLET | Freq: Every day | ORAL | Status: DC
  Administered 2017-04-19 – 2017-04-20 (×2): 1 via ORAL
  Filled 2017-04-17 (×2): qty 1

## 2017-04-17 MED ORDER — CALCIUM CARBONATE-VITAMIN D 500-200 MG-UNIT PO TABS
2.0000 | ORAL_TABLET | Freq: Two times a day (BID) | ORAL | Status: DC
  Administered 2017-04-18 – 2017-04-20 (×4): 2 via ORAL
  Filled 2017-04-17 (×6): qty 2

## 2017-04-17 MED ORDER — OXYCODONE HCL 5 MG PO TABS: 5.0000 mg | ORAL_TABLET | ORAL | Status: DC | PRN

## 2017-04-17 MED ORDER — ONDANSETRON HCL 4 MG/2ML IJ SOLN: 4.0000 mg | Freq: Four times a day (QID) | INTRAMUSCULAR | Status: DC | PRN

## 2017-04-17 MED ORDER — ACETAMINOPHEN 325 MG PO TABS: 650.0000 mg | ORAL_TABLET | Freq: Four times a day (QID) | ORAL | Status: DC | PRN

## 2017-04-17 MED ORDER — MEGESTROL ACETATE 400 MG/10ML PO SUSP
625.0000 mg | Freq: Every day | ORAL | Status: DC
  Administered 2017-04-19: 625 mg via ORAL
  Filled 2017-04-17 (×3): qty 20

## 2017-04-17 MED ORDER — DOCUSATE SODIUM 50 MG/5ML PO LIQD
150.0000 mg | Freq: Two times a day (BID) | ORAL | Status: DC
  Administered 2017-04-18 – 2017-04-19 (×3): 150 mg via ORAL
  Filled 2017-04-17 (×7): qty 20

## 2017-04-17 MED ORDER — ACETAMINOPHEN 650 MG RE SUPP: 650.0000 mg | Freq: Four times a day (QID) | RECTAL | Status: DC | PRN

## 2017-04-17 MED ORDER — AMITRIPTYLINE HCL 25 MG PO TABS
25.0000 mg | ORAL_TABLET | Freq: Every day | ORAL | Status: DC
  Administered 2017-04-18 – 2017-04-19 (×2): 25 mg via ORAL
  Filled 2017-04-17 (×3): qty 1

## 2017-04-17 MED ORDER — SODIUM CHLORIDE 0.9 % IV SOLN
Freq: Once | INTRAVENOUS | Status: AC
  Administered 2017-04-18: 15:00:00 via INTRAVENOUS

## 2017-04-17 MED ORDER — NITROGLYCERIN 0.4 MG SL SUBL: 0.4000 mg | SUBLINGUAL_TABLET | SUBLINGUAL | Status: DC | PRN

## 2017-04-17 MED ORDER — MORPHINE SULFATE (PF) 2 MG/ML IV SOLN
2.0000 mg | INTRAVENOUS | Status: DC | PRN
  Administered 2017-04-18 (×3): 2 mg via INTRAVENOUS
  Filled 2017-04-17 (×3): qty 1

## 2017-04-17 MED ORDER — ACETAMINOPHEN 325 MG PO TABS
650.0000 mg | ORAL_TABLET | Freq: Four times a day (QID) | ORAL | Status: DC | PRN
  Administered 2017-04-19 – 2017-04-20 (×2): 650 mg via ORAL
  Filled 2017-04-17 (×2): qty 2

## 2017-04-17 NOTE — ED Notes (Signed)
Kala RN, aware of bed assigned  

## 2017-04-17 NOTE — H&P (Signed)
[facility] [place] NAME: Angela Howe    MR#:  [id number]  DATE OF BIRTH:  [birth year]  DATE OF ADMISSION:  04/17/2017  PRIMARY CARE PHYSICIAN: Einar Pheasant, MD   REQUESTING/REFERRING PHYSICIAN: Dr. Harvest Dark  CHIEF COMPLAINT:   Chief Complaint  Patient presents with  . Fall    HISTORY OF PRESENT ILLNESS:  Angela Howe  is a 83 y.o. female with a known history of dementia, failure to thrive. His wheelchair bound at baseline, CAD, GERD, hypothyroidism who has had prior falls from a wheelchair presents to the hospital secondary to an unwitnessed fall today and noted to have left intertrochanteric fracture. Patient is unable to provide any history at this time due to her dementia. She was here in October 2018 for a fall from her wheelchair, did not have any fractures at the time. Was noted to be anemic with hemoglobin dropped from 11-7 at the time. She did receive transfusion and her discharge hemoglobin was 9. Today she is noted to have a hemoglobin of 7.3 again. Family denies any active bleeding. Iron studies at that time show only moderately decreased iron levels. Family denies any dyspnea, chest pain complaints. Patient's bypass surgery was several years ago. She appears pale at this time, other than her left hip pain patient denies any complaints. She was not sick recently. Last seen by family yesterday. This morning noticed by her caregivers after the fall. She did not lose any consciousness.  PAST MEDICAL HISTORY:   Past Medical History:  Diagnosis Date  . Breast cancer ([place])   . CAD (coronary artery disease)   . Degenerative disc disease   . Dementia   . GERD (gastroesophageal reflux disease)   . Hyperlipidemia    Previous intolerance of statins  . Hypothyroidism   . Orthostatic hypotension   . Osteoarthritis   . Peptic ulcer disease   . Syncope     PAST SURGICAL HISTORY:   Past Surgical History:  Procedure  Laterality Date  . ABDOMINAL HYSTERECTOMY  1970  . APPENDECTOMY    . BIOPSY BREAST  2014  . BREAST EXCISIONAL BIOPSY Left 2014   neg  . BREAST EXCISIONAL BIOPSY Left 2006   postive radation  . BREAST LUMPECTOMY  2006  . CHOLECYSTECTOMY    . CORONARY ARTERY BYPASS GRAFT  05/2006   x3  . NASAL SINUS SURGERY    . ROTATOR CUFF REPAIR      SOCIAL HISTORY:   Social History   Tobacco Use  . Smoking status: Never Smoker  . Smokeless tobacco: Never Used  Substance Use Topics  . Alcohol use: No    Alcohol/week: 0.0 oz    FAMILY HISTORY:   Family History  Problem Relation Age of Onset  . Heart attack Sister   . Cancer Brother   . Pancreatic cancer Brother   . Ovarian cancer Sister     DRUG ALLERGIES:   Allergies  Allergen Reactions  . Alendronate Sodium Other (See Comments)    Reaction:  Restlessness   . Prednisone Other (See Comments)    Reaction:  Restlessness     REVIEW OF SYSTEMS:   Review of Systems  Unable to perform ROS: Dementia    MEDICATIONS AT HOME:   Prior to Admission medications   Medication Sig Start Date End Date Taking? Authorizing Provider  acetaminophen (TYLENOL) 325 MG tablet Take 2 tablets (650 mg total) by mouth every 6 (six) hours as needed for  mild pain (or Fever >/= 101). 02/23/17  Yes Dustin Flock, MD  amitriptyline (ELAVIL) 25 MG tablet Take 1 tablet (25 mg total) by mouth at bedtime. 02/23/17  Yes Dustin Flock, MD  calcium-vitamin D (OSCAL WITH D) 500-200 MG-UNIT tablet Take 2 tablets by mouth 2 (two) times daily. 02/23/17  Yes Dustin Flock, MD  citalopram (CELEXA) 20 MG tablet Take 1 tablet (20 mg total) by mouth daily. 08/08/16  Yes Einar Pheasant, MD  docusate (COLACE) 50 MG/5ML liquid Take 5 mLs (50 mg total) by mouth 2 (two) times daily. Patient taking differently: Take 150 mg by mouth 2 (two) times daily.  02/23/17  Yes Dustin Flock, MD  fentaNYL (DURAGESIC - DOSED MCG/HR) 12 MCG/HR Place 1 patch (12.5 mcg total) onto  the skin every 3 (three) days. 02/23/17  Yes Dustin Flock, MD  fludrocortisone (FLORINEF) 0.1 MG tablet Take 1 tablet (0.1 mg total) by mouth daily. 03/22/15  Yes Einar Pheasant, MD  levothyroxine (SYNTHROID, LEVOTHROID) 50 MCG tablet Take 1 tablet (50 mcg total) by mouth daily. 08/08/16  Yes Einar Pheasant, MD  lidocaine (LIDODERM) 5 % Place 2 patches onto the skin daily. Remove & Discard patch within 12 hours or as directed by MD 02/23/17  Yes Dustin Flock, MD  megestrol (MEGACE ES) 625 MG/5ML suspension Take 5 mLs (625 mg total) by mouth daily. 02/23/17  Yes Dustin Flock, MD  Multiple Vitamin (MULTIVITAMIN WITH MINERALS) TABS tablet Take 1 tablet by mouth daily. 02/23/17  Yes Dustin Flock, MD  nitroGLYCERIN (NITROSTAT) 0.4 MG SL tablet Place 1 tablet (0.4 mg total) under the tongue every 5 (five) minutes as needed for chest pain. 07/10/14  Yes Einar Pheasant, MD  omeprazole (PRILOSEC) 40 MG capsule Take 40 mg by mouth daily.   Yes [provider]  QUEtiapine (SEROQUEL) 25 MG tablet Take 12.5 mg by mouth at bedtime.   Yes [provider]  atorvastatin (LIPITOR) 20 MG tablet Take 1 tablet (20 mg total) by mouth daily. Patient not taking: Reported on 04/17/2017 08/08/16   Einar Pheasant, MD      VITAL SIGNS:  Blood pressure (!) 121/44, pulse 93, temperature 98.4 F (36.9 C), temperature source Oral, resp. rate 18, height 5\' 5"  (1.651 m), weight 47.6 kg (105 lb), SpO2 100 %.  PHYSICAL EXAMINATION:   Physical Exam  GENERAL:  83 y.o.-year-old elderly patient lying in the bed , complains of left hip pain.  EYES: Pupils equal, round, reactive to light and accommodation. No scleral icterus. Extraocular muscles intact.  HEENT: Head atraumatic, normocephalic. Oropharynx and nasopharynx clear.  NECK:  Supple, no jugular venous distention. No thyroid enlargement, no tenderness.  LUNGS: Normal breath sounds bilaterally, no wheezing, rales,rhonchi or crepitation. No use  of accessory muscles of respiration. Decreased bibasilar breath sounds CARDIOVASCULAR: S1, S2 normal. No rubs, or gallops. 2/6 systolic murmur present ABDOMEN: Soft, nontender, nondistended. Bowel sounds present. No organomegaly or mass.  EXTREMITIES: No cyanosis, or clubbing. 1+ bilateral ankle edema noted NEUROLOGIC: Cranial nerves II through XII are intact. Muscle strength 5/5 in all extremities except left leg which she is abducted and externally rotated.. Sensation intact. Gait not checked.  PSYCHIATRIC: The patient is alert and oriented to self.  SKIN: No obvious rash, lesion, or ulcer.   LABORATORY PANEL:   CBC Recent Labs  Lab 04/17/17 1707  WBC 9.2  HGB 7.5*  HCT 23.0*  PLT 327   ------------------------------------------------------------------------------------------------------------------  Chemistries  Recent Labs  Lab 04/17/17 1707  NA 140  K  3.9  CL 111  CO2 21*  GLUCOSE 108*  BUN 31*  CREATININE 1.31*  CALCIUM 8.6*   ------------------------------------------------------------------------------------------------------------------  Cardiac Enzymes No results for input(s): TROPONINI in the last 168 hours. ------------------------------------------------------------------------------------------------------------------  RADIOLOGY:  Dg Chest 1 View  Result Date: 04/17/2017 CLINICAL DATA:  Fall with left hip pain EXAM: CHEST 1 VIEW COMPARISON:  02/20/2017 FINDINGS: Post sternotomy changes. Small left pleural effusion. Partial consolidation in the left lower lobe. Stable cardiomediastinal silhouette with aortic atherosclerosis. No pneumothorax. Postsurgical changes at the right humeral head parent IMPRESSION: 1. Small left pleural effusion with partial consolidation in the left lower lobe which may reflect atelectasis or an infiltrate 2. Borderline cardiomegaly. Electronically Signed   By: Donavan Foil M.D.   On: 04/17/2017 17:48   Ct Head Wo  Contrast  Result Date: 04/17/2017 CLINICAL DATA:  Unwitnessed fall hematoma to back of head EXAM: CT HEAD WITHOUT CONTRAST CT CERVICAL SPINE WITHOUT CONTRAST TECHNIQUE: Multidetector CT imaging of the head and cervical spine was performed following the standard protocol without intravenous contrast. Multiplanar CT image reconstructions of the cervical spine were also generated. COMPARISON:  02/24/2017, 10/01/2007, CT chest 02/21/2017 FINDINGS: CT HEAD FINDINGS Brain: No acute territorial infarction, hemorrhage or intracranial mass is visualized. Old lacunar infarct in the left caudate. Moderate small vessel ischemic changes of the white matter. Moderate atrophy. Stable ventricle size. Vascular: No hyperdense vessels.  Carotid artery calcification Skull: No depressed skull fracture. Sinuses/Orbits: Old nasal bone deformity. Probable old deformity anterior wall left maxillary sinus. No acute orbital abnormality Other: Moderate left posterior parietal scalp hematoma. CT CERVICAL SPINE FINDINGS Alignment: No subluxation.  Facet alignment is within normal limits. Skull base and vertebrae: Craniovertebral junction is intact. Superior endplate deformity at T2 and T3, stable since October 2018 CT. Soft tissues and spinal canal: No prevertebral fluid or swelling. No visible canal hematoma. Disc levels:  Multilevel degenerative changes, moderate at C5-C6. Upper chest: Carotid artery calcification. Other: None IMPRESSION: 1. No CT evidence for acute intracranial abnormality. Atrophy and small vessel ischemic changes of the white matter. Moderate left posterior scalp hematoma 2. Degenerative changes of the cervical spine. No acute fracture is seen Electronically Signed   By: Donavan Foil M.D.   On: 04/17/2017 18:32   Ct Cervical Spine Wo Contrast  Result Date: 04/17/2017 CLINICAL DATA:  Unwitnessed fall hematoma to back of head EXAM: CT HEAD WITHOUT CONTRAST CT CERVICAL SPINE WITHOUT CONTRAST TECHNIQUE: Multidetector  CT imaging of the head and cervical spine was performed following the standard protocol without intravenous contrast. Multiplanar CT image reconstructions of the cervical spine were also generated. COMPARISON:  02/24/2017, 10/01/2007, CT chest 02/21/2017 FINDINGS: CT HEAD FINDINGS Brain: No acute territorial infarction, hemorrhage or intracranial mass is visualized. Old lacunar infarct in the left caudate. Moderate small vessel ischemic changes of the white matter. Moderate atrophy. Stable ventricle size. Vascular: No hyperdense vessels.  Carotid artery calcification Skull: No depressed skull fracture. Sinuses/Orbits: Old nasal bone deformity. Probable old deformity anterior wall left maxillary sinus. No acute orbital abnormality Other: Moderate left posterior parietal scalp hematoma. CT CERVICAL SPINE FINDINGS Alignment: No subluxation.  Facet alignment is within normal limits. Skull base and vertebrae: Craniovertebral junction is intact. Superior endplate deformity at T2 and T3, stable since October 2018 CT. Soft tissues and spinal canal: No prevertebral fluid or swelling. No visible canal hematoma. Disc levels:  Multilevel degenerative changes, moderate at C5-C6. Upper chest: Carotid artery calcification. Other: None IMPRESSION: 1. No CT evidence for acute intracranial  abnormality. Atrophy and small vessel ischemic changes of the white matter. Moderate left posterior scalp hematoma 2. Degenerative changes of the cervical spine. No acute fracture is seen Electronically Signed   By: Donavan Foil M.D.   On: 04/17/2017 18:32   Dg Hip Unilat W Or Wo Pelvis 2-3 Views Left  Result Date: 04/17/2017 CLINICAL DATA:  Fall with hip pain EXAM: DG HIP (WITH OR WITHOUT PELVIS) 2-3V LEFT COMPARISON:  CT 02/21/2017 FINDINGS: Treated compression fractures of the lower lumbar spine partially visible. Bones appear osteopenic. Pubic symphysis and rami appear intact. Advanced arthritis of the right hip with mild remodeling of  the femoral head, joint space narrowing, sclerosis. Acute, comminuted and impacted intertrochanteric fracture. Left femoral head appears located. Apex anterior angulation on cross-table lateral view. IMPRESSION: Acute, comminuted left intertrochanteric fracture. Electronically Signed   By: Donavan Foil M.D.   On: 04/17/2017 17:51    EKG:   Orders placed or performed during the hospital encounter of 04/17/17  . ED EKG  . ED EKG  . EKG 12-Lead  . EKG 12-Lead    IMPRESSION AND PLAN:   Angela Howe  is a 83 y.o. female with a known history of dementia, failure to thrive. His wheelchair bound at baseline, CAD, GERD, hypothyroidism who has had prior falls from a wheelchair presents to the hospital secondary to an unwitnessed fall today and noted to have left intertrochanteric fracture.  1. Acute left intertrochanteric fracture- secondary to mechanical fall. -Admit, orthopedics consult. Nothing by mouth after midnight. -Moderate to high risk for surgery with her anemia. -2 units packed RBC being transfused prior to surgery. -Pain control, physical therapy consult after the surgery. -Patient is wheelchair bound at baseline  2. Anemia-worsening anemia of chronic disease. Recent transfusion improved hemoglobin from 7-9, repeat hemoglobin at 7 today. - no active bleeding noted per family -Recent anemia labs were done which do not indicate significant iron deficiency. Outpatient follow-up with hematology recommended. -For now perioperatively, 2 units packed RBC transfusion ordered and monitor closely after surgery.  3. Chronic low back pain-continue fentanyl patch and lidoderm patch  4. Hypothyroidism-continue Synthroid  5. DVT prophylaxis-will be started after surgery if hemoglobin is stable   All the records are reviewed and case discussed with ED provider. Management plans discussed with the patient, family and they are in agreement.  CODE STATUS: DO NOT RESUSCITATE- has a signed DO NOT  RESUSCITATE on the chart  TOTAL TIME TAKING CARE OF THIS PATIENT: 50 minutes.    Gladstone Lighter M.D on 04/17/2017 at 8:47 PM  Between 7am to 6pm - Pager - [phone]  After 6pm go to [email or web address] - password [facility]  Office  [phone]  CC: Primary care physician; Einar Pheasant, MD

## 2017-04-17 NOTE — ED Triage Notes (Signed)
Pt arrived via EMS from [facility] facility from an unwitnessed fall from her wheelchair. Pt has complaints of left hip pain and bilateral femur pain. EMS reported that pt is not on blood thinners. Pt has a Hx Dementia. Pt has a hematoma on back of head from fall. Pt has no obvious deformity on left leg. Pt has DNR and paperwork at the bedside from facility. VS per EMS BP-122/44 HR-94. Pt is agitated and screaming out in pain.

## 2017-04-17 NOTE — ED Provider Notes (Signed)
[facility] Emergency Department Provider Note  Time seen: 5:12 PM  I have reviewed the triage vital signs and the nursing notes.   HISTORY  Chief Complaint Fall    HPI Angela Howe is a 83 y.o. female with a past medical history of gastric reflux, hyperlipidemia presents to the emergency department after a fall.  According to EMS they state the patient was at [facility] facility and was found on the ground next to her wheelchair.  Is not sure how she fell out of the wheelchair.  It is noted that the patient has a hematoma to the back of her scalp and is complaining of significant left hip pain.  Patient denies any neck pain or back pain.  Denies any headache.  Cannot tell me exactly how she fell out of the wheelchair.  Unclear what her baseline is.  Past Medical History:  Diagnosis Date  . Breast cancer ([place])   . CAD (coronary artery disease)   . Degenerative disc disease   . GERD (gastroesophageal reflux disease)   . Hyperlipidemia    Previous intolerance of statins  . Hypothyroidism   . Orthostatic hypotension   . Osteoarthritis   . Peptic ulcer disease   . Syncope     Patient Active Problem List   Diagnosis Date Noted  . Subacute delirium 02/21/2017  . Palliative care by specialist   . Advance care planning   . Goals of care, counseling/discussion   . Dehydration 02/20/2017  . Pancolitis ([place]) 12/21/2016  . Unsteady gait 08/13/2016  . Falls frequently 07/04/2016  . Chest pain 10/12/2015  . Rib fracture 10/03/2015  . Right carotid bruit 08/01/2015  . Loss of weight 01/12/2015  . Anemia 07/18/2014  . Health care maintenance 07/18/2014  . Hoarseness 07/18/2014  . Abdominal pain 05/05/2014  . Back pain 10/05/2013  . Knee pain 07/14/2013  . Oral pain 05/06/2013  . Itching 01/05/2013  . History of breast cancer 11/20/2012  . Syncope 11/20/2012  . Hypothyroidism 08/10/2012  . Difficulty sleeping 08/10/2012  . Dizziness  11/08/2010  . Hyperlipemia 03/23/2009  . HYPERTENSION, BENIGN 03/23/2009  . CAD, NATIVE VESSEL 09/24/2008  . ORTHOSTATIC HYPOTENSION 09/24/2008  . ABNORMAL EKG 09/24/2008    Past Surgical History:  Procedure Laterality Date  . ABDOMINAL HYSTERECTOMY  1970  . APPENDECTOMY    . BIOPSY BREAST  2014  . BREAST EXCISIONAL BIOPSY Left 2014   neg  . BREAST EXCISIONAL BIOPSY Left 2006   postive radation  . BREAST LUMPECTOMY  2006  . CHOLECYSTECTOMY    . CORONARY ARTERY BYPASS GRAFT  05/2006   x3  . NASAL SINUS SURGERY    . ROTATOR CUFF REPAIR      Prior to Admission medications   Medication Sig Start Date End Date Taking? Authorizing Provider  acetaminophen (TYLENOL) 325 MG tablet Take 2 tablets (650 mg total) by mouth every 6 (six) hours as needed for mild pain (or Fever >/= 101). 02/23/17   Dustin Flock, MD  amitriptyline (ELAVIL) 25 MG tablet Take 1 tablet (25 mg total) by mouth at bedtime. 02/23/17   Dustin Flock, MD  aspirin EC 81 MG tablet Take 81 mg by mouth daily.    [provider]  atorvastatin (LIPITOR) 20 MG tablet Take 1 tablet (20 mg total) by mouth daily. 08/08/16   Einar Pheasant, MD  calcium-vitamin D (OSCAL WITH D) 500-200 MG-UNIT tablet Take 2 tablets by mouth 2 (two) times daily. 02/23/17  Dustin Flock, MD  citalopram (CELEXA) 20 MG tablet Take 1 tablet (20 mg total) by mouth daily. 08/08/16   Einar Pheasant, MD  docusate (COLACE) 50 MG/5ML liquid Take 5 mLs (50 mg total) by mouth 2 (two) times daily. 02/23/17   Dustin Flock, MD  fentaNYL (DURAGESIC - DOSED MCG/HR) 12 MCG/HR Place 1 patch (12.5 mcg total) onto the skin every 3 (three) days. 02/23/17   Dustin Flock, MD  fludrocortisone (FLORINEF) 0.1 MG tablet Take 1 tablet (0.1 mg total) by mouth daily. 03/22/15   Einar Pheasant, MD  lansoprazole (PREVACID) 30 MG capsule Take 30 mg by mouth daily.      [provider]  levothyroxine (SYNTHROID, LEVOTHROID) 50 MCG tablet Take 1 tablet  (50 mcg total) by mouth daily. 08/08/16   Einar Pheasant, MD  lidocaine (LIDODERM) 5 % Place 2 patches onto the skin daily. Remove & Discard patch within 12 hours or as directed by MD 02/23/17   Dustin Flock, MD  megestrol (MEGACE ES) 625 MG/5ML suspension Take 5 mLs (625 mg total) by mouth daily. 02/23/17   Dustin Flock, MD  Multiple Vitamin (MULTIVITAMIN WITH MINERALS) TABS tablet Take 1 tablet by mouth daily. 02/23/17   Dustin Flock, MD  nitroGLYCERIN (NITROSTAT) 0.4 MG SL tablet Place 1 tablet (0.4 mg total) under the tongue every 5 (five) minutes as needed for chest pain. 07/10/14   Einar Pheasant, MD    Allergies  Allergen Reactions  . Alendronate Sodium Other (See Comments)    Reaction:  Restlessness   . Prednisone Other (See Comments)    Reaction:  Restlessness     Family History  Problem Relation Age of Onset  . Heart attack Sister   . Cancer Brother   . Pancreatic cancer Brother   . Ovarian cancer Sister     Social History Social History   Tobacco Use  . Smoking status: Never Smoker  . Smokeless tobacco: Never Used  Substance Use Topics  . Alcohol use: No    Alcohol/week: 0.0 oz  . Drug use: No    Review of Systems Constitutional: Negative for fever. Cardiovascular: Negative for chest pain. Respiratory: Negative for shortness of breath. Gastrointestinal: Negative for abdominal pain Musculoskeletal: Left hip pain.  Negative for neck or back pain. Neurological: Negative for headache All other ROS negative  ____________________________________________   PHYSICAL EXAM:  VITAL SIGNS: ED Triage Vitals  Enc Vitals Group     BP 04/17/17 1709 129/78     Pulse Rate 04/17/17 1709 90     Resp 04/17/17 1709 18     Temp 04/17/17 1709 98.4 F (36.9 C)     Temp Source 04/17/17 1709 Oral     SpO2 04/17/17 1709 100 %     Weight 04/17/17 1709 105 lb (47.6 kg)     Height 04/17/17 1709 5\' 5"  (1.651 m)     Head Circumference --      Peak Flow --      Pain  Score 04/17/17 1706 10     Pain Loc --      Pain Edu? --      Excl. in [place]? --    Constitutional: Alert, oriented to person, place, states the year is 1918 but fairly close.  Patient is very verbal and moans and yells in pain with any movement to her lower extremities. ENT   Head: Small hematoma to occipital scalp.   Mouth/Throat: Mucous membranes are moist. Cardiovascular: Normal rate, regular rhythm Respiratory: Normal respiratory effort  without tachypnea nor retractions. Breath sounds are clear Gastrointestinal: Soft and nontender. No distention.   Musculoskeletal: No C-spine, T or L-spine tenderness.  Patient does have significant left hip tenderness to palpation especially with range of motion.  Will obtain x-ray imaging to further evaluate. Neurologic:  Normal speech and language. No gross focal neurologic deficits  Skin:  Skin is warm, dry and intact.  Psychiatric: Mood and affect are normal.  ____________________________________________    EKG  EKG reviewed and interpreted by myself shows normal sinus rhythm at 92 bpm, narrow QRS, normal axis, largely normal intervals, no ST elevation.  ____________________________________________    RADIOLOGY  CT scan of the head is negative. CT scan of the neck is negative. Left intertrochanteric fracture. Chest x-ray shows atelectasis versus pneumonia, no cough or congestion.  Afebrile.  I believe more consistent with atelectasis. ____________________________________________   INITIAL IMPRESSION / ASSESSMENT AND PLAN / ED COURSE  Pertinent labs & imaging results that were available during my care of the patient were reviewed by me and considered in my medical decision making (see chart for details).  Patient presents to the emergency department after a fall from her wheelchair, unwitnessed.  Differential would include hip fracture, contusions, pelvic fracture, ICH.  Will obtain a CT scan of the head and neck as a precaution.   Will obtain x-ray imaging of the left hip and pelvis.  We will check labs and continue to closely monitor in the emergency department.  Patient agreeable to this plan of care.  Patient has a left intertrochanteric fracture.  Discussed with Dr. Harlow Mares plan to repair operatively tomorrow.  Discussed with the hospitalist will be admitting to the hospital.  Patient is anemic however this appears to be fairly baseline for the patient but will likely require transfusion prior to operation.  ____________________________________________   FINAL CLINICAL IMPRESSION(S) / ED DIAGNOSES  Fall Left intertrochanteric fracture   Harvest Dark, MD 04/17/17 [phone]

## 2017-04-18 ENCOUNTER — Inpatient Hospital Stay: Admitting: Anesthesiology

## 2017-04-18 ENCOUNTER — Encounter: Payer: Self-pay | Admitting: Anesthesiology

## 2017-04-18 ENCOUNTER — Inpatient Hospital Stay

## 2017-04-18 ENCOUNTER — Other Ambulatory Visit: Payer: Self-pay

## 2017-04-18 ENCOUNTER — Encounter
Admission: EM | Disposition: A | Discharge status: Hospice | Payer: Self-pay | Source: Home / Self Care | Attending: Internal Medicine

## 2017-04-18 HISTORY — PX: INTRAMEDULLARY (IM) NAIL INTERTROCHANTERIC: SHX5875

## 2017-04-18 LAB — SURGICAL PCR SCREEN
MRSA, PCR: NEGATIVE
STAPHYLOCOCCUS AUREUS: POSITIVE — AB

## 2017-04-18 LAB — CBC
HEMATOCRIT: 37.3 % (ref 35.0–47.0)
HEMOGLOBIN: 12.6 g/dL (ref 12.0–16.0)
MCH: 28.6 pg (ref 26.0–34.0)
MCHC: 33.9 g/dL (ref 32.0–36.0)
MCV: 84.4 fL (ref 80.0–100.0)
Platelets: 260 10*3/uL (ref 150–440)
RBC: 4.42 MIL/uL (ref 3.80–5.20)
RDW: 16.2 % — ABNORMAL HIGH (ref 11.5–14.5)
WBC: 8.3 10*3/uL (ref 3.6–11.0)

## 2017-04-18 LAB — BASIC METABOLIC PANEL
ANION GAP: 10 (ref 5–15)
BUN: 29 mg/dL — ABNORMAL HIGH (ref 6–20)
CALCIUM: 8.8 mg/dL — AB (ref 8.9–10.3)
CO2: 21 mmol/L — ABNORMAL LOW (ref 22–32)
Chloride: 109 mmol/L (ref 101–111)
Creatinine, Ser: 0.99 mg/dL (ref 0.44–1.00)
GFR, EST AFRICAN AMERICAN: 57 mL/min — AB (ref >60)
GFR, EST NON AFRICAN AMERICAN: 49 mL/min — AB (ref >60)
GLUCOSE: 111 mg/dL — AB (ref 65–99)
POTASSIUM: 4.2 mmol/L (ref 3.5–5.1)
SODIUM: 140 mmol/L (ref 135–145)

## 2017-04-18 SURGERY — FIXATION, FRACTURE, INTERTROCHANTERIC, WITH INTRAMEDULLARY ROD
Anesthesia: General | Laterality: Left | Wound class: Clean

## 2017-04-18 MED ORDER — CEFAZOLIN SODIUM-DEXTROSE 1-4 GM/50ML-% IV SOLN: 1.0000 g | Freq: Four times a day (QID) | INTRAVENOUS | Status: DC

## 2017-04-18 MED ORDER — HYDROCODONE-ACETAMINOPHEN 5-325 MG PO TABS: 2.0000 | ORAL_TABLET | ORAL | Status: DC | PRN

## 2017-04-18 MED ORDER — BUPIVACAINE-EPINEPHRINE (PF) 0.25% -1:200000 IJ SOLN
INTRAMUSCULAR | Status: AC
  Filled 2017-04-18: qty 30

## 2017-04-18 MED ORDER — SENNA 8.6 MG PO TABS
1.0000 | ORAL_TABLET | Freq: Two times a day (BID) | ORAL | Status: DC
  Administered 2017-04-19 – 2017-04-20 (×3): 8.6 mg via ORAL
  Filled 2017-04-18 (×4): qty 1

## 2017-04-18 MED ORDER — LIDOCAINE 2% (20 MG/ML) 5 ML SYRINGE
INTRAMUSCULAR | Status: DC | PRN
  Administered 2017-04-18: 50 mg via INTRAVENOUS

## 2017-04-18 MED ORDER — SODIUM CHLORIDE FLUSH 0.9 % IV SOLN
INTRAVENOUS | Status: AC
  Filled 2017-04-18: qty 10

## 2017-04-18 MED ORDER — ROCURONIUM BROMIDE 100 MG/10ML IV SOLN
INTRAVENOUS | Status: DC | PRN
  Administered 2017-04-18 (×2): 15 mg via INTRAVENOUS

## 2017-04-18 MED ORDER — FENTANYL CITRATE (PF) 100 MCG/2ML IJ SOLN
INTRAMUSCULAR | Status: AC
  Filled 2017-04-18: qty 2

## 2017-04-18 MED ORDER — LACTATED RINGERS IV SOLN
INTRAVENOUS | Status: DC | PRN
  Administered 2017-04-18: 15:00:00 via INTRAVENOUS

## 2017-04-18 MED ORDER — ASPIRIN 81 MG PO CHEW
81.0000 mg | CHEWABLE_TABLET | Freq: Every day | ORAL | Status: DC
  Administered 2017-04-19 – 2017-04-20 (×2): 81 mg via ORAL
  Filled 2017-04-18 (×2): qty 1

## 2017-04-18 MED ORDER — SODIUM CHLORIDE 0.9 % IV SOLN
INTRAVENOUS | Status: DC | PRN
  Administered 2017-04-18: 30 ug/min via INTRAVENOUS

## 2017-04-18 MED ORDER — ONDANSETRON HCL 4 MG/2ML IJ SOLN
INTRAMUSCULAR | Status: AC
  Filled 2017-04-18: qty 2

## 2017-04-18 MED ORDER — ONDANSETRON HCL 4 MG/2ML IJ SOLN: 4.0000 mg | Freq: Once | INTRAMUSCULAR | Status: DC | PRN

## 2017-04-18 MED ORDER — SUGAMMADEX SODIUM 200 MG/2ML IV SOLN
INTRAVENOUS | Status: DC | PRN
  Administered 2017-04-18: 100 mg via INTRAVENOUS

## 2017-04-18 MED ORDER — BACITRACIN 50000 UNITS IM SOLR
INTRAMUSCULAR | Status: AC
  Filled 2017-04-18: qty 1

## 2017-04-18 MED ORDER — SUGAMMADEX SODIUM 200 MG/2ML IV SOLN
INTRAVENOUS | Status: AC
  Filled 2017-04-18: qty 2

## 2017-04-18 MED ORDER — CEFAZOLIN SODIUM 1 G IJ SOLR
1.0000 g | Freq: Once | INTRAMUSCULAR | Status: AC
  Administered 2017-04-18: 1 g via INTRAVENOUS
  Filled 2017-04-18: qty 10

## 2017-04-18 MED ORDER — LACTATED RINGERS IV SOLN
INTRAVENOUS | Status: DC
  Administered 2017-04-19: 23:00:00 via INTRAVENOUS

## 2017-04-18 MED ORDER — SUCCINYLCHOLINE CHLORIDE 20 MG/ML IJ SOLN
INTRAMUSCULAR | Status: DC | PRN
  Administered 2017-04-18: 60 mg via INTRAVENOUS

## 2017-04-18 MED ORDER — NITROGLYCERIN 2 % TD OINT
0.5000 [in_us] | TOPICAL_OINTMENT | Freq: Four times a day (QID) | TRANSDERMAL | Status: DC
  Administered 2017-04-18 – 2017-04-20 (×7): 0.5 [in_us] via TOPICAL
  Filled 2017-04-18 (×2): qty 0.5
  Filled 2017-04-18: qty 30
  Filled 2017-04-18 (×2): qty 0.5
  Filled 2017-04-18: qty 30
  Filled 2017-04-18 (×2): qty 0.5

## 2017-04-18 MED ORDER — ONDANSETRON HCL 4 MG/2ML IJ SOLN
INTRAMUSCULAR | Status: DC | PRN
  Administered 2017-04-18: 4 mg via INTRAVENOUS

## 2017-04-18 MED ORDER — FENTANYL CITRATE (PF) 100 MCG/2ML IJ SOLN: 25.0000 ug | INTRAMUSCULAR | Status: DC | PRN

## 2017-04-18 MED ORDER — PROPOFOL 10 MG/ML IV BOLUS
INTRAVENOUS | Status: DC | PRN
Start: 2017-04-18 — End: 2017-04-18
  Administered 2017-04-18: 50 mg via INTRAVENOUS

## 2017-04-18 MED ORDER — METOPROLOL TARTRATE 5 MG/5ML IV SOLN
2.5000 mg | Freq: Four times a day (QID) | INTRAVENOUS | Status: DC
  Administered 2017-04-19 – 2017-04-20 (×4): 2.5 mg via INTRAVENOUS
  Filled 2017-04-18 (×5): qty 5

## 2017-04-18 MED ORDER — PHENYLEPHRINE HCL 10 MG/ML IJ SOLN
INTRAMUSCULAR | Status: DC | PRN
  Administered 2017-04-18 (×2): 80 ug via INTRAVENOUS

## 2017-04-18 MED ORDER — DEXTROSE 5 % IV SOLN
Freq: Four times a day (QID) | INTRAVENOUS | Status: AC
  Administered 2017-04-18 – 2017-04-19 (×3): via INTRAVENOUS
  Filled 2017-04-18 (×4): qty 10

## 2017-04-18 MED ORDER — METOCLOPRAMIDE HCL 10 MG PO TABS: 5.0000 mg | ORAL_TABLET | Freq: Three times a day (TID) | ORAL | Status: DC | PRN

## 2017-04-18 MED ORDER — FENTANYL CITRATE (PF) 100 MCG/2ML IJ SOLN
INTRAMUSCULAR | Status: DC | PRN
  Administered 2017-04-18 (×2): 25 ug via INTRAVENOUS
  Administered 2017-04-18 (×2): 50 ug via INTRAVENOUS

## 2017-04-18 MED ORDER — CEFAZOLIN (ANCEF) 1 G IV SOLR: 1.0000 g | INTRAVENOUS | Status: DC

## 2017-04-18 MED ORDER — MORPHINE SULFATE (PF) 2 MG/ML IV SOLN: 1.0000 mg | INTRAVENOUS | Status: DC | PRN

## 2017-04-18 MED ORDER — HYDROCODONE-ACETAMINOPHEN 5-325 MG PO TABS
1.0000 | ORAL_TABLET | ORAL | Status: DC | PRN
  Administered 2017-04-19: 1 via ORAL
  Filled 2017-04-18: qty 1

## 2017-04-18 MED ORDER — METOCLOPRAMIDE HCL 5 MG/ML IJ SOLN: 5.0000 mg | Freq: Three times a day (TID) | INTRAMUSCULAR | Status: DC | PRN

## 2017-04-18 SURGICAL SUPPLY — 32 items
BASIN GRAD PLASTIC 32OZ STRL (MISCELLANEOUS) ×3 IMPLANT
BLADE HELICAL TFNA 90 HIP (Anchor) ×1 IMPLANT
BLADE HELICAL TFNA 90MM HIP (Anchor) ×1 IMPLANT
BNDG COHESIVE 4X5 TAN STRL (GAUZE/BANDAGES/DRESSINGS) IMPLANT
BRUSH SCRUB EZ  4% CHG (MISCELLANEOUS) ×4
BRUSH SCRUB EZ 4% CHG (MISCELLANEOUS) ×2 IMPLANT
CANISTER SUCT 1200ML W/VALVE (MISCELLANEOUS) ×3 IMPLANT
CHLORAPREP W/TINT 26ML (MISCELLANEOUS) ×3 IMPLANT
DRAPE SHEET LG 3/4 BI-LAMINATE (DRAPES) ×3 IMPLANT
DRAPE U-SHAPE 47X51 STRL (DRAPES) ×3 IMPLANT
DRSG AQUACEL AG ADV 3.5X10 (GAUZE/BANDAGES/DRESSINGS) ×2 IMPLANT
ELECT REM PT RETURN 9FT ADLT (ELECTROSURGICAL) ×3
ELECTRODE REM PT RTRN 9FT ADLT (ELECTROSURGICAL) ×1 IMPLANT
GLOVE BIOGEL PI IND STRL 7.0 (GLOVE) IMPLANT
GLOVE BIOGEL PI INDICATOR 7.0 (GLOVE) ×2
GLOVE INDICATOR 8.0 STRL GRN (GLOVE) ×5 IMPLANT
GLOVE SURG ORTHO 8.0 STRL STRW (GLOVE) ×3 IMPLANT
GOWN STRL REUS W/ TWL LRG LVL3 (GOWN DISPOSABLE) ×1 IMPLANT
GOWN STRL REUS W/ TWL XL LVL3 (GOWN DISPOSABLE) ×1 IMPLANT
GOWN STRL REUS W/TWL LRG LVL3 (GOWN DISPOSABLE) ×3
GOWN STRL REUS W/TWL XL LVL3 (GOWN DISPOSABLE) ×3
GUIDEWIRE 3.2X400 (WIRE) ×4 IMPLANT
KIT RM TURNOVER CYSTO AR (KITS) ×3 IMPLANT
NAIL CANN TFNA 9MM/130 360MM (Nail) ×2 IMPLANT
NS IRRIG 1000ML POUR BTL (IV SOLUTION) ×3 IMPLANT
PACK HIP COMPR (MISCELLANEOUS) ×3 IMPLANT
REAMER ROD DEEP FLUTE 2.5X950 (INSTRUMENTS) ×2 IMPLANT
STAPLER SKIN PROX 35W (STAPLE) ×3 IMPLANT
SUT VIC AB 0 CT1 36 (SUTURE) ×3 IMPLANT
SUT VIC AB 2-0 CT1 27 (SUTURE) ×3
SUT VIC AB 2-0 CT1 TAPERPNT 27 (SUTURE) ×1 IMPLANT
TOWEL OR 17X26 4PK STRL BLUE (TOWEL DISPOSABLE) ×3 IMPLANT

## 2017-04-18 NOTE — Progress Notes (Signed)
[place] at [facility]                                                                                                                                                                                  Patient Demographics   Angela Howe, is a 83 y.o. female, DOB - 25-Sep-1925, MOQ:[id number]  Admit date - 04/17/2017   Admitting Physician Gladstone Lighter, MD  Outpatient Primary MD for the patient is Einar Pheasant, MD   LOS - 1  Subjective: P admitted with hip fracture    Review of Systems:   CONSTITUTIONAL: Confused  Vitals:   Vitals:   04/18/17 1349 04/18/17 1615 04/18/17 1630 04/18/17 1645  BP: (!) 186/76 (!) 178/91 (!) 179/81 (!) 186/82  Pulse: 98 (!) 109 (!) 102 100  Resp: 18 12 14 14   Temp: 99.1 F (37.3 C) 99.2 F (37.3 C)  99.4 F (37.4 C)  TempSrc: Tympanic     SpO2: 99% 99% 96% 95%  Weight:      Height:        Wt Readings from Last 3 Encounters:  04/17/17 105 lb (47.6 kg)  02/24/17 103 lb (46.7 kg)  02/20/17 103 lb (46.7 kg)     Intake/Output Summary (Last 24 hours) at 04/18/2017 1651 Last data filed at 04/18/2017 1605 Gross per 24 hour  Intake 1223 ml  Output 450 ml  Net 773 ml    Physical Exam:   GENERAL: Pleasant-appearing in no apparent distress.  HEAD, EYES, EARS, NOSE AND THROAT: Atraumatic, normocephalic. Extraocular muscles are intact. Pupils equal and reactive to light. Sclerae anicteric. No conjunctival injection. No oro-pharyngeal erythema.  NECK: Supple. There is no jugular venous distention. No bruits, no lymphadenopathy, no thyromegaly.  HEART: Regular rate and rhythm,. No murmurs, no rubs, no clicks.  LUNGS: Clear to auscultation bilaterally. No rales or rhonchi. No wheezes.  ABDOMEN: Soft, flat, nontender, nondistended. Has good bowel sounds. No hepatosplenomegaly appreciated.  EXTREMITIES: No evidence of any cyanosis, clubbing, or peripheral edema.  +2 pedal and radial pulses bilaterally.  NEUROLOGIC:  Confused SKIN: Moist and warm with no rashes appreciated.  Psych: Confused LN: No inguinal LN enlargement    Antibiotics   Anti-infectives (From admission, onward)   Start     Dose/Rate Route Frequency Ordered Stop   04/18/17 1000  [MAR Hold]  ceFAZolin (ANCEF) 1 g in dextrose 5 % 50 mL IVPB     (MAR Hold since 04/18/17 1348)   1 g 100 mL/hr over 30 Minutes Intravenous  Once 04/18/17 0927 04/18/17 1505   04/18/17 0915  ceFAZolin (ANCEF) powder 1 g  Status:  Discontinued     1 g  Other To Surgery 04/18/17 1517 04/18/17 0927      Medications   Scheduled Meds: . [MAR Hold] amitriptyline  25 mg Oral QHS  . [MAR Hold] calcium-vitamin D  2 tablet Oral BID  . [MAR Hold] citalopram  20 mg Oral Daily  . [MAR Hold] docusate  150 mg Oral BID  . [MAR Hold] fentaNYL  12.5 mcg Transdermal Q72H  . [MAR Hold] fludrocortisone  0.1 mg Oral Daily  . [MAR Hold] levothyroxine  50 mcg Oral QAC breakfast  . [MAR Hold] lidocaine  2 patch Transdermal Q24H  . [MAR Hold] megestrol  625 mg Oral Daily  . [MAR Hold] multivitamin with minerals  1 tablet Oral Daily  . [MAR Hold] nitroGLYCERIN  0.5 inch Topical Q6H  . [MAR Hold] pantoprazole  40 mg Oral Daily  . [MAR Hold] QUEtiapine  12.5 mg Oral QHS   Continuous Infusions: . sodium chloride 60 mL/hr at 04/18/17 0448   PRN Meds:.[MAR Hold] acetaminophen **OR** [MAR Hold] acetaminophen, fentaNYL (SUBLIMAZE) injection, [MAR Hold]  morphine injection, [MAR Hold] nitroGLYCERIN, [MAR Hold] ondansetron **OR** [MAR Hold] ondansetron (ZOFRAN) IV, ondansetron (ZOFRAN) IV, [MAR Hold] oxyCODONE, [MAR Hold] polyethylene glycol   Data Review:   Micro Results Recent Results (from the past 240 hour(s))  Surgical pcr screen     Status: Abnormal   Collection Time: 04/17/17 10:12 PM  Result Value Ref Range Status   MRSA, PCR NEGATIVE NEGATIVE Final   Staphylococcus aureus POSITIVE (A) NEGATIVE Final    Comment: (NOTE) The Xpert SA Assay (FDA approved for NASAL  specimens in patients 71 years of age and older), is one component of a comprehensive surveillance program. It is not intended to diagnose infection nor to guide or monitor treatment.     Radiology Reports Dg Chest 1 View  Result Date: 04/17/2017 CLINICAL DATA:  Fall with left hip pain EXAM: CHEST 1 VIEW COMPARISON:  02/20/2017 FINDINGS: Post sternotomy changes. Small left pleural effusion. Partial consolidation in the left lower lobe. Stable cardiomediastinal silhouette with aortic atherosclerosis. No pneumothorax. Postsurgical changes at the right humeral head parent IMPRESSION: 1. Small left pleural effusion with partial consolidation in the left lower lobe which may reflect atelectasis or an infiltrate 2. Borderline cardiomegaly. Electronically Signed   By: Donavan Foil M.D.   On: 04/17/2017 17:48   Ct Head Wo Contrast  Result Date: 04/17/2017 CLINICAL DATA:  Unwitnessed fall hematoma to back of head EXAM: CT HEAD WITHOUT CONTRAST CT CERVICAL SPINE WITHOUT CONTRAST TECHNIQUE: Multidetector CT imaging of the head and cervical spine was performed following the standard protocol without intravenous contrast. Multiplanar CT image reconstructions of the cervical spine were also generated. COMPARISON:  02/24/2017, 10/01/2007, CT chest 02/21/2017 FINDINGS: CT HEAD FINDINGS Brain: No acute territorial infarction, hemorrhage or intracranial mass is visualized. Old lacunar infarct in the left caudate. Moderate small vessel ischemic changes of the white matter. Moderate atrophy. Stable ventricle size. Vascular: No hyperdense vessels.  Carotid artery calcification Skull: No depressed skull fracture. Sinuses/Orbits: Old nasal bone deformity. Probable old deformity anterior wall left maxillary sinus. No acute orbital abnormality Other: Moderate left posterior parietal scalp hematoma. CT CERVICAL SPINE FINDINGS Alignment: No subluxation.  Facet alignment is within normal limits. Skull base and vertebrae:  Craniovertebral junction is intact. Superior endplate deformity at T2 and T3, stable since October 2018 CT. Soft tissues and spinal canal: No prevertebral fluid or swelling. No visible canal hematoma. Disc levels:  Multilevel degenerative changes, moderate at C5-C6. Upper chest: Carotid artery calcification.  Other: None IMPRESSION: 1. No CT evidence for acute intracranial abnormality. Atrophy and small vessel ischemic changes of the white matter. Moderate left posterior scalp hematoma 2. Degenerative changes of the cervical spine. No acute fracture is seen Electronically Signed   By: Donavan Foil M.D.   On: 04/17/2017 18:32   Ct Cervical Spine Wo Contrast  Result Date: 04/17/2017 CLINICAL DATA:  Unwitnessed fall hematoma to back of head EXAM: CT HEAD WITHOUT CONTRAST CT CERVICAL SPINE WITHOUT CONTRAST TECHNIQUE: Multidetector CT imaging of the head and cervical spine was performed following the standard protocol without intravenous contrast. Multiplanar CT image reconstructions of the cervical spine were also generated. COMPARISON:  02/24/2017, 10/01/2007, CT chest 02/21/2017 FINDINGS: CT HEAD FINDINGS Brain: No acute territorial infarction, hemorrhage or intracranial mass is visualized. Old lacunar infarct in the left caudate. Moderate small vessel ischemic changes of the white matter. Moderate atrophy. Stable ventricle size. Vascular: No hyperdense vessels.  Carotid artery calcification Skull: No depressed skull fracture. Sinuses/Orbits: Old nasal bone deformity. Probable old deformity anterior wall left maxillary sinus. No acute orbital abnormality Other: Moderate left posterior parietal scalp hematoma. CT CERVICAL SPINE FINDINGS Alignment: No subluxation.  Facet alignment is within normal limits. Skull base and vertebrae: Craniovertebral junction is intact. Superior endplate deformity at T2 and T3, stable since October 2018 CT. Soft tissues and spinal canal: No prevertebral fluid or swelling. No visible  canal hematoma. Disc levels:  Multilevel degenerative changes, moderate at C5-C6. Upper chest: Carotid artery calcification. Other: None IMPRESSION: 1. No CT evidence for acute intracranial abnormality. Atrophy and small vessel ischemic changes of the white matter. Moderate left posterior scalp hematoma 2. Degenerative changes of the cervical spine. No acute fracture is seen Electronically Signed   By: Donavan Foil M.D.   On: 04/17/2017 18:32   Dg Hip Operative Unilat W Or W/o Pelvis Left  Result Date: 04/18/2017 CLINICAL DATA:  Left intertrochanteric fracture repair EXAM: OPERATIVE LEFT HIP (WITH PELVIS IF PERFORMED) 3 VIEWS TECHNIQUE: Fluoroscopic spot image(s) were submitted for interpretation post-operatively. COMPARISON:  None FLUOROSCOPY TIME:  47 seconds FINDINGS: Left intertrochanteric fracture transfixed with an intramedullary nail and interlocking cannulated femoral neck screw without hardware failure or complication. Mild persistent displacement. IMPRESSION: Interval left intertrochanteric fracture ORIF. Electronically Signed   By: Kathreen Devoid   On: 04/18/2017 16:10   Dg Hip Unilat W Or Wo Pelvis 2-3 Views Left  Result Date: 04/17/2017 CLINICAL DATA:  Fall with hip pain EXAM: DG HIP (WITH OR WITHOUT PELVIS) 2-3V LEFT COMPARISON:  CT 02/21/2017 FINDINGS: Treated compression fractures of the lower lumbar spine partially visible. Bones appear osteopenic. Pubic symphysis and rami appear intact. Advanced arthritis of the right hip with mild remodeling of the femoral head, joint space narrowing, sclerosis. Acute, comminuted and impacted intertrochanteric fracture. Left femoral head appears located. Apex anterior angulation on cross-table lateral view. IMPRESSION: Acute, comminuted left intertrochanteric fracture. Electronically Signed   By: Donavan Foil M.D.   On: 04/17/2017 17:51     CBC Recent Labs  Lab 04/17/17 1707 04/18/17 0554  WBC 9.2 8.3  HGB 7.5* 12.6  HCT 23.0* 37.3  PLT 327  260  MCV 83.2 84.4  MCH 27.0 28.6  MCHC 32.5 33.9  RDW 18.1* 16.2*    Chemistries  Recent Labs  Lab 04/17/17 1707 04/18/17 0554  NA 140 140  K 3.9 4.2  CL 111 109  CO2 21* 21*  GLUCOSE 108* 111*  BUN 31* 29*  CREATININE 1.31* 0.99  CALCIUM 8.6*  8.8*   ------------------------------------------------------------------------------------------------------------------ estimated creatinine clearance is 28.9 mL/min (by C-G formula based on SCr of 0.99 mg/dL). ------------------------------------------------------------------------------------------------------------------ No results for input(s): HGBA1C in the last 72 hours. ------------------------------------------------------------------------------------------------------------------ No results for input(s): CHOL, HDL, LDLCALC, TRIG, CHOLHDL, LDLDIRECT in the last 72 hours. ------------------------------------------------------------------------------------------------------------------ No results for input(s): TSH, T4TOTAL, T3FREE, THYROIDAB in the last 72 hours.  Invalid input(s): FREET3 ------------------------------------------------------------------------------------------------------------------ No results for input(s): VITAMINB12, FOLATE, FERRITIN, TIBC, IRON, RETICCTPCT in the last 72 hours.  Coagulation profile Recent Labs  Lab 04/17/17 1945  INR 1.12    No results for input(s): DDIMER in the last 72 hours.  Cardiac Enzymes No results for input(s): CKMB, TROPONINI, MYOGLOBIN in the last 168 hours.  Invalid input(s): CK ------------------------------------------------------------------------------------------------------------------ Invalid input(s): [place]   Angela Howe  is a 83 y.o. female with a known history of dementia, failure to thrive. His wheelchair bound at baseline, CAD, GERD, hypothyroidism who has had prior falls from a wheelchair presents to the hospital secondary to an  unwitnessed fall today and noted to have left intertrochanteric fracture.  1. Acute left intertrochanteric fracture- secondary to mechanical fall. -Continue pain control Plan for surgery today  2. Anemia-worsening anemia of chronic disease.  Status post transfusion hemoglobin now much improved   3. Chronic low back pain-continue fentanyl patch and lidoderm patch  4. Hypothyroidism-continue Synthroid  5. DVT prophylaxis-will be started after surgery if hemoglobin is stable         Code Status Orders  (From admission, onward)        Start     Ordered   04/17/17 2112  Do not attempt resuscitation (DNR)  Continuous    Question Answer Comment  In the event of cardiac or respiratory ARREST Do not call a "code blue"   In the event of cardiac or respiratory ARREST Do not perform Intubation, CPR, defibrillation or ACLS   In the event of cardiac or respiratory ARREST Use medication by any route, position, wound care, and other measures to relive pain and suffering. May use oxygen, suction and manual treatment of airway obstruction as needed for comfort.      04/17/17 2111    Code Status History    Date Active Date Inactive Code Status Order ID Comments User Context   02/20/2017 15:22 02/23/2017 17:44 DNR [id number]  Henreitta Leber, MD Inpatient   12/21/2016 16:14 12/24/2016 17:20 Full Code [id number]  Epifanio Lesches, MD ED   12/21/2016 15:47 12/21/2016 16:14 DNR [id number]  Epifanio Lesches, MD ED   12/21/2016 15:46 12/21/2016 15:47 Full Code [id number]  Epifanio Lesches, MD ED   10/01/2015 17:34 10/02/2015 18:18 DNR [id number]  Loletha Grayer, MD ED    Advance Directive Documentation     Most Recent Value  Type of Advance Directive  Healthcare Power of Attorney  Pre-existing out of facility DNR order (yellow form or pink MOST form)  No data  "MOST" Form in Place?  No data           Consults orthopedic   DVT Prophylaxis  Lovenox recommended  Lab Results   Component Value Date   PLT 260 04/18/2017     Time Spent in minutes   35 minutes  Greater than 50% of time spent in care coordination and counseling patient regarding the condition and plan of care.   Dustin Flock M.D on 04/18/2017 at 4:51 PM  Between 7am to 6pm - Pager - [phone]  After 6pm go to [email or web address] - password [email or web address]  [facility]   Office  [phone]

## 2017-04-18 NOTE — Progress Notes (Signed)
Visit made. Patient is currently followed by [place] at [facility] with a diagnosis of Heart disease. She is a DNR with out of facility DNR in place. Patient was sent to the [facility] ED following a fall from her wheel chair d/t complaints of pain in her left hip. In the ED she was found to have an acute comminuted left intertrochanteric fracture. Plan is for surgical repair today. Patient seen lying in bed, eyes closed, but did open her eyes to voice. Daughter [name] at bedside. She has spoken to the orthopaedic surgeon and confirmed the plan for surgery today to repair the patient's broken hip. Writer provided emotional support. Will continue to follow and update hospice team. Hospital care team aware of hospice involvement. Transfer summary in place on hospital chart. Flo Shanks RN, BSN, [place] and [facility] of [facility] hospital Liaison [phone]

## 2017-04-18 NOTE — Care Management (Signed)
Spoke with Flo Shanks with [facility]. Patient is active hospice patient at [facility]. Admitted with a fall and left hip fracture. Wheelchair bound at baseline. Ortho consult pending.

## 2017-04-18 NOTE — Op Note (Addendum)
DATE OF SURGERY:  04/18/2017  TIME: 4:38 PM  PATIENT NAME:  Angela Howe  AGE: 83 y.o.  PRE-OPERATIVE DIAGNOSIS:  left hip fracture  POST-OPERATIVE DIAGNOSIS:  SAME  PROCEDURE:  INTRAMEDULLARY (IM) NAIL INTERTROCHANTRIC, LEFT HIP  SURGEON:  Lovell Sheehan  EBL:  50 cc  COMPLICATIONS:  None apparent  OPERATIVE IMPLANTS: Synthes trochanteric femoral nail 9 mm x 360 mm  with interlocking helical blade  90 mm  PREOPERATIVE INDICATIONS:  Angela Howe is a 83 y.o. year old who fell and suffered a hip fracture. She was brought into the ER and then admitted and optimized and then elected for surgical intervention.    The risks benefits and alternatives were discussed with the patient including but not limited to the risks of nonoperative treatment, versus surgical intervention including infection, bleeding, nerve injury, malunion, nonunion, hardware prominence, hardware failure, need for hardware removal, blood clots, cardiopulmonary complications, morbidity, mortality, among others, and they were willing to proceed.    OPERATIVE PROCEDURE:  The patient was brought to the operating room and placed in the supine position.  General anesthesia was administered, with a foley. She was placed on the fracture table.  Closed reduction was performed under C-arm guidance. The length of the femur was also measured using fluoroscopy. Time out was then performed after sterile prep and drape. She received preoperative antibiotics.  Incision was made proximal to the greater trochanter. A guidewire was placed in the appropriate position. Confirmation was made on AP and lateral views. The above-named nail was opened. I opened the proximal femur with a reamer. I then placed the nail by hand easily down. I did not need to ream the femur.  Once the nail was completely seated, I placed a guidepin into the femoral head into the center center position through a second incision.  I measured the  length, and then reamed the lateral cortex and up into the head. I then placed the helical blade. Slight compression was applied. Anatomic fixation achieved. Bone quality was poor.  I then secured the proximal interlock.  I then removed the instruments, and took final C-arm pictures AP and lateral the entire length of the leg. Anatomic reconstruction was achieved, and the wounds were irrigated copiously and closed with Vicryl  followed by staples and dry sterile dressing. Sponge and needle count were correct.   The patient was awakened and returned to PACU in stable and satisfactory condition. There no complications and the patient tolerated the procedure well.  She will be weightbearing as tolerated.    Lovell Sheehan

## 2017-04-18 NOTE — Anesthesia Post-op Follow-up Note (Signed)
Anesthesia QCDR form completed.        

## 2017-04-18 NOTE — Anesthesia Preprocedure Evaluation (Addendum)
Anesthesia Evaluation  Patient identified by MRN, date of birth, ID band Patient awake and Patient confused    Reviewed: Allergy & Precautions, NPO status , Patient's Chart, lab work & pertinent test results  Airway Mallampati: II  TM Distance: <3 FB     Dental  (+) Teeth Intact, Chipped   Pulmonary    Pulmonary exam normal        Cardiovascular hypertension, Pt. on medications + CAD  Normal cardiovascular exam     Neuro/Psych PSYCHIATRIC DISORDERS    GI/Hepatic PUD, GERD  ,  Endo/Other  Hypothyroidism   Renal/GU      Musculoskeletal  (+) Arthritis , Osteoarthritis,    Abdominal Normal abdominal exam  (+)   Peds  Hematology  (+) anemia ,   Anesthesia Other Findings Past Medical History: No date: Breast cancer ([place]) No date: CAD (coronary artery disease) No date: Degenerative disc disease No date: Dementia No date: GERD (gastroesophageal reflux disease) No date: Hyperlipidemia     Comment:  Previous intolerance of statins No date: Hypothyroidism No date: Orthostatic hypotension No date: Osteoarthritis No date: Peptic ulcer disease No date: Syncope  Reproductive/Obstetrics                           Anesthesia Physical Anesthesia Plan  ASA: III  Anesthesia Plan: General   Post-op Pain Management:    Induction: Intravenous  PONV Risk Score and Plan:   Airway Management Planned: Oral ETT  Additional Equipment:   Intra-op Plan:   Post-operative Plan: Extubation in OR  Informed Consent: I have reviewed the patients History and Physical, chart, labs and discussed the procedure including the risks, benefits and alternatives for the proposed anesthesia with the patient or authorized representative who has indicated his/her understanding and acceptance.   Dental advisory given  Plan Discussed with: CRNA and Surgeon  Anesthesia Plan Comments:        Anesthesia Quick  Evaluation

## 2017-04-18 NOTE — Progress Notes (Signed)
PT Cancellation Note  Patient Details Name: Naiya Corral MRN: [id number] DOB: 1925-12-05   Cancelled Treatment:     Per chart review, pt with new intertrochanteric hip fracture.  Pt has ortho consult pending, will continue to follow as appropriate after ortho consult.  Madelyn Tlatelpa A Schae Cando, PT 04/18/2017, 9:44 AM

## 2017-04-18 NOTE — Transfer of Care (Signed)
Immediate Anesthesia Transfer of Care Note  Patient: Angela Howe  Procedure(s) Performed: INTRAMEDULLARY (IM) NAIL INTERTROCHANTRIC (Left )  Patient Location: PACU  Anesthesia Type:General  Level of Consciousness: drowsy and patient cooperative  Airway & Oxygen Therapy: Patient Spontanous Breathing and Patient connected to face mask oxygen  Post-op Assessment: Report given to RN and Post -op Vital signs reviewed and stable  Post vital signs: Reviewed and stable  Last Vitals:  Vitals:   04/18/17 1349 04/18/17 1615  BP: (!) 186/76 (!) 178/91  Pulse: 98 (!) 109  Resp: 18 12  Temp: 37.3 C 37.3 C  SpO2: 99% 99%    Last Pain:  Vitals:   04/18/17 1349  TempSrc: Tympanic  PainSc:          Complications: No apparent anesthesia complications

## 2017-04-18 NOTE — Progress Notes (Signed)
Patient BP elevated and HR fluctuating from 130's to 100. MD notified. Orders pending.

## 2017-04-18 NOTE — Progress Notes (Signed)
Paged [phone] prime doc for HR in 140's

## 2017-04-18 NOTE — Anesthesia Postprocedure Evaluation (Signed)
Anesthesia Post Note  Patient: Angela Howe  Procedure(s) Performed: INTRAMEDULLARY (IM) NAIL INTERTROCHANTRIC (Left )  Patient location during evaluation: PACU Anesthesia Type: General Level of consciousness: awake and alert Pain management: pain level controlled Vital Signs Assessment: post-procedure vital signs reviewed and stable Respiratory status: spontaneous breathing, nonlabored ventilation and respiratory function stable Cardiovascular status: blood pressure returned to baseline and stable Postop Assessment: no signs of nausea or vomiting Anesthetic complications: no     Last Vitals:  Vitals:   04/18/17 1630 04/18/17 1645  BP: (!) 179/81 (!) 186/82  Pulse: (!) 102 100  Resp: 14 14  Temp:  37.4 C  SpO2: 96% 95%    Last Pain:  Vitals:   04/18/17 1645  TempSrc:   PainSc: 0-No pain                 Dmarius Reeder

## 2017-04-18 NOTE — Consult Note (Signed)
ORTHOPAEDIC CONSULTATION  REQUESTING PHYSICIAN: Dustin Flock, MD  Chief Complaint: left hip pain  HPI: Angela Howe is a 83 y.o. female who complains of left hip pain. History taken from patient's family and the chart. Patient is unable to follow commands.  Past Medical History:  Diagnosis Date  . Breast cancer ([place])   . CAD (coronary artery disease)   . Degenerative disc disease   . Dementia   . GERD (gastroesophageal reflux disease)   . Hyperlipidemia    Previous intolerance of statins  . Hypothyroidism   . Orthostatic hypotension   . Osteoarthritis   . Peptic ulcer disease   . Syncope    Past Surgical History:  Procedure Laterality Date  . ABDOMINAL HYSTERECTOMY  1970  . APPENDECTOMY    . BIOPSY BREAST  2014  . BREAST EXCISIONAL BIOPSY Left 2014   neg  . BREAST EXCISIONAL BIOPSY Left 2006   postive radation  . BREAST LUMPECTOMY  2006  . CHOLECYSTECTOMY    . CORONARY ARTERY BYPASS GRAFT  05/2006   x3  . NASAL SINUS SURGERY    . ROTATOR CUFF REPAIR     Social History   Socioeconomic History  . Marital status: Widowed    Spouse name: None  . Number of children: None  . Years of education: None  . Highest education level: None  Social Needs  . Financial resource strain: None  . Food insecurity - worry: None  . Food insecurity - inability: None  . Transportation needs - medical: None  . Transportation needs - non-medical: None  Occupational History  . Occupation: Retired    Fish farm manager: RETIRED  Tobacco Use  . Smoking status: Never Smoker  . Smokeless tobacco: Never Used  Substance and Sexual Activity  . Alcohol use: No    Alcohol/week: 0.0 oz  . Drug use: No  . Sexual activity: None  Other Topics Concern  . None  Social History Narrative   Widowed   Patient is at a long-term care at [facility]. Wheelchair bound at baseline   Family History  Problem Relation Age of Onset  . Heart attack Sister   . Cancer Brother   . Pancreatic  cancer Brother   . Ovarian cancer Sister    Allergies  Allergen Reactions  . Alendronate Sodium Other (See Comments)    Reaction:  Restlessness   . Prednisone Other (See Comments)    Reaction:  Restlessness    Prior to Admission medications   Medication Sig Start Date End Date Taking? Authorizing Provider  acetaminophen (TYLENOL) 325 MG tablet Take 2 tablets (650 mg total) by mouth every 6 (six) hours as needed for mild pain (or Fever >/= 101). 02/23/17  Yes Dustin Flock, MD  amitriptyline (ELAVIL) 25 MG tablet Take 1 tablet (25 mg total) by mouth at bedtime. 02/23/17  Yes Dustin Flock, MD  calcium-vitamin D (OSCAL WITH D) 500-200 MG-UNIT tablet Take 2 tablets by mouth 2 (two) times daily. 02/23/17  Yes Dustin Flock, MD  citalopram (CELEXA) 20 MG tablet Take 1 tablet (20 mg total) by mouth daily. 08/08/16  Yes Einar Pheasant, MD  docusate (COLACE) 50 MG/5ML liquid Take 5 mLs (50 mg total) by mouth 2 (two) times daily. Patient taking differently: Take 150 mg by mouth 2 (two) times daily.  02/23/17  Yes Dustin Flock, MD  fentaNYL (DURAGESIC - DOSED MCG/HR) 12 MCG/HR Place 1 patch (12.5 mcg total) onto the skin every 3 (three) days. 02/23/17  Yes Dustin Flock,  MD  fludrocortisone (FLORINEF) 0.1 MG tablet Take 1 tablet (0.1 mg total) by mouth daily. 03/22/15  Yes Einar Pheasant, MD  levothyroxine (SYNTHROID, LEVOTHROID) 50 MCG tablet Take 1 tablet (50 mcg total) by mouth daily. 08/08/16  Yes Einar Pheasant, MD  lidocaine (LIDODERM) 5 % Place 2 patches onto the skin daily. Remove & Discard patch within 12 hours or as directed by MD 02/23/17  Yes Dustin Flock, MD  megestrol (MEGACE ES) 625 MG/5ML suspension Take 5 mLs (625 mg total) by mouth daily. 02/23/17  Yes Dustin Flock, MD  Multiple Vitamin (MULTIVITAMIN WITH MINERALS) TABS tablet Take 1 tablet by mouth daily. 02/23/17  Yes Dustin Flock, MD  nitroGLYCERIN (NITROSTAT) 0.4 MG SL tablet Place 1 tablet (0.4 mg total)  under the tongue every 5 (five) minutes as needed for chest pain. 07/10/14  Yes Einar Pheasant, MD  omeprazole (PRILOSEC) 40 MG capsule Take 40 mg by mouth daily.   Yes [provider]  QUEtiapine (SEROQUEL) 25 MG tablet Take 12.5 mg by mouth at bedtime.   Yes [provider]  atorvastatin (LIPITOR) 20 MG tablet Take 1 tablet (20 mg total) by mouth daily. Patient not taking: Reported on 04/17/2017 08/08/16   Einar Pheasant, MD   Dg Chest 1 View  Result Date: 04/17/2017 CLINICAL DATA:  Fall with left hip pain EXAM: CHEST 1 VIEW COMPARISON:  02/20/2017 FINDINGS: Post sternotomy changes. Small left pleural effusion. Partial consolidation in the left lower lobe. Stable cardiomediastinal silhouette with aortic atherosclerosis. No pneumothorax. Postsurgical changes at the right humeral head parent IMPRESSION: 1. Small left pleural effusion with partial consolidation in the left lower lobe which may reflect atelectasis or an infiltrate 2. Borderline cardiomegaly. Electronically Signed   By: Donavan Foil M.D.   On: 04/17/2017 17:48   Ct Head Wo Contrast  Result Date: 04/17/2017 CLINICAL DATA:  Unwitnessed fall hematoma to back of head EXAM: CT HEAD WITHOUT CONTRAST CT CERVICAL SPINE WITHOUT CONTRAST TECHNIQUE: Multidetector CT imaging of the head and cervical spine was performed following the standard protocol without intravenous contrast. Multiplanar CT image reconstructions of the cervical spine were also generated. COMPARISON:  02/24/2017, 10/01/2007, CT chest 02/21/2017 FINDINGS: CT HEAD FINDINGS Brain: No acute territorial infarction, hemorrhage or intracranial mass is visualized. Old lacunar infarct in the left caudate. Moderate small vessel ischemic changes of the white matter. Moderate atrophy. Stable ventricle size. Vascular: No hyperdense vessels.  Carotid artery calcification Skull: No depressed skull fracture. Sinuses/Orbits: Old nasal bone deformity. Probable old deformity  anterior wall left maxillary sinus. No acute orbital abnormality Other: Moderate left posterior parietal scalp hematoma. CT CERVICAL SPINE FINDINGS Alignment: No subluxation.  Facet alignment is within normal limits. Skull base and vertebrae: Craniovertebral junction is intact. Superior endplate deformity at T2 and T3, stable since October 2018 CT. Soft tissues and spinal canal: No prevertebral fluid or swelling. No visible canal hematoma. Disc levels:  Multilevel degenerative changes, moderate at C5-C6. Upper chest: Carotid artery calcification. Other: None IMPRESSION: 1. No CT evidence for acute intracranial abnormality. Atrophy and small vessel ischemic changes of the white matter. Moderate left posterior scalp hematoma 2. Degenerative changes of the cervical spine. No acute fracture is seen Electronically Signed   By: Donavan Foil M.D.   On: 04/17/2017 18:32   Ct Cervical Spine Wo Contrast  Result Date: 04/17/2017 CLINICAL DATA:  Unwitnessed fall hematoma to back of head EXAM: CT HEAD WITHOUT CONTRAST CT CERVICAL SPINE WITHOUT CONTRAST TECHNIQUE: Multidetector CT imaging of the head and  cervical spine was performed following the standard protocol without intravenous contrast. Multiplanar CT image reconstructions of the cervical spine were also generated. COMPARISON:  02/24/2017, 10/01/2007, CT chest 02/21/2017 FINDINGS: CT HEAD FINDINGS Brain: No acute territorial infarction, hemorrhage or intracranial mass is visualized. Old lacunar infarct in the left caudate. Moderate small vessel ischemic changes of the white matter. Moderate atrophy. Stable ventricle size. Vascular: No hyperdense vessels.  Carotid artery calcification Skull: No depressed skull fracture. Sinuses/Orbits: Old nasal bone deformity. Probable old deformity anterior wall left maxillary sinus. No acute orbital abnormality Other: Moderate left posterior parietal scalp hematoma. CT CERVICAL SPINE FINDINGS Alignment: No subluxation.  Facet  alignment is within normal limits. Skull base and vertebrae: Craniovertebral junction is intact. Superior endplate deformity at T2 and T3, stable since October 2018 CT. Soft tissues and spinal canal: No prevertebral fluid or swelling. No visible canal hematoma. Disc levels:  Multilevel degenerative changes, moderate at C5-C6. Upper chest: Carotid artery calcification. Other: None IMPRESSION: 1. No CT evidence for acute intracranial abnormality. Atrophy and small vessel ischemic changes of the white matter. Moderate left posterior scalp hematoma 2. Degenerative changes of the cervical spine. No acute fracture is seen Electronically Signed   By: Donavan Foil M.D.   On: 04/17/2017 18:32   Dg Hip Unilat W Or Wo Pelvis 2-3 Views Left  Result Date: 04/17/2017 CLINICAL DATA:  Fall with hip pain EXAM: DG HIP (WITH OR WITHOUT PELVIS) 2-3V LEFT COMPARISON:  CT 02/21/2017 FINDINGS: Treated compression fractures of the lower lumbar spine partially visible. Bones appear osteopenic. Pubic symphysis and rami appear intact. Advanced arthritis of the right hip with mild remodeling of the femoral head, joint space narrowing, sclerosis. Acute, comminuted and impacted intertrochanteric fracture. Left femoral head appears located. Apex anterior angulation on cross-table lateral view. IMPRESSION: Acute, comminuted left intertrochanteric fracture. Electronically Signed   By: Donavan Foil M.D.   On: 04/17/2017 17:51    Positive ROS: All other systems have been reviewed and were otherwise negative with the exception of those mentioned in the HPI and as above.  Physical Exam: General: Alert, no acute distress Cardiovascular: No pedal edema Respiratory: No cyanosis, no use of accessory musculature GI: No organomegaly, abdomen is soft and non-tender Skin: No lesions in the area of chief complaint Neurologic: Sensation intact distally Psychiatric: Patient is competent for consent with normal mood and affect Lymphatic: No  axillary or cervical lymphadenopathy  MUSCULOSKELETAL: left hip flexed and shortened, good cap refill Tender over lateral hip  Assessment: Left hip intertrochanteric hip fracture  Plan: Left hip trochanteric femoral nailing.   The diagnosis, risks, benefits and alternatives to treatment are all discussed in detail with the patient and family, including her power of attorney. Risks include but are not limited to bleeding, infection, deep vein thrombosis, pulmonary embolism, nerve or vascular injury, non-union, repeat operation, persistent pain, weakness, stiffness and death. They understand and are eager to proceed.     Lovell Sheehan, MD    04/18/2017 10:44 AM

## 2017-04-18 NOTE — Anesthesia Procedure Notes (Signed)
Procedure Name: Intubation Date/Time: 04/18/2017 3:00 PM Performed by: Marsh Dolly, CRNA Pre-anesthesia Checklist: Patient identified, Patient being monitored, Timeout performed, Emergency Drugs available and Suction available Patient Re-evaluated:Patient Re-evaluated prior to induction Oxygen Delivery Method: Circle system utilized Preoxygenation: Pre-oxygenation with 100% oxygen Induction Type: IV induction Ventilation: Mask ventilation without difficulty Laryngoscope Size: Mac, Sabra Heck and 2 Grade View: Grade I Tube type: Oral Tube size: 7.0 mm Number of attempts: 1 Airway Equipment and Method: Stylet Placement Confirmation: ETT inserted through vocal cords under direct vision,  positive ETCO2 and breath sounds checked- equal and bilateral Secured at: 21 cm Tube secured with: Tape Dental Injury: Teeth and Oropharynx as per pre-operative assessment

## 2017-04-19 ENCOUNTER — Encounter: Payer: Self-pay | Admitting: Orthopedic Surgery

## 2017-04-19 LAB — CBC WITH DIFFERENTIAL/PLATELET
BASOS ABS: 0 10*3/uL (ref 0–0.1)
BASOS PCT: 0 %
EOS ABS: 0 10*3/uL (ref 0–0.7)
EOS PCT: 0 %
HEMATOCRIT: 29.5 % — AB (ref 35.0–47.0)
Hemoglobin: 9.7 g/dL — ABNORMAL LOW (ref 12.0–16.0)
Lymphocytes Relative: 18 %
Lymphs Abs: 1.3 10*3/uL (ref 1.0–3.6)
MCH: 28 pg (ref 26.0–34.0)
MCHC: 33 g/dL (ref 32.0–36.0)
MCV: 84.7 fL (ref 80.0–100.0)
MONO ABS: 0.9 10*3/uL (ref 0.2–0.9)
MONOS PCT: 13 %
NEUTROS ABS: 5 10*3/uL (ref 1.4–6.5)
Neutrophils Relative %: 69 %
PLATELETS: 227 10*3/uL (ref 150–440)
RBC: 3.48 MIL/uL — ABNORMAL LOW (ref 3.80–5.20)
RDW: 16.5 % — AB (ref 11.5–14.5)
WBC: 7.3 10*3/uL (ref 3.6–11.0)

## 2017-04-19 LAB — BPAM RBC
BLOOD PRODUCT EXPIRATION DATE: 201901132359
Blood Product Expiration Date: 201901132359
ISSUE DATE / TIME: 201812182240
ISSUE DATE / TIME: 201812190208
UNIT TYPE AND RH: 6200
UNIT TYPE AND RH: 6200

## 2017-04-19 LAB — TYPE AND SCREEN
ABO/RH(D): A POS
ANTIBODY SCREEN: NEGATIVE
UNIT DIVISION: 0
Unit division: 0

## 2017-04-19 LAB — COMPREHENSIVE METABOLIC PANEL
ALT: 42 U/L (ref 14–54)
ANION GAP: 7 (ref 5–15)
AST: 70 U/L — ABNORMAL HIGH (ref 15–41)
Albumin: 2.8 g/dL — ABNORMAL LOW (ref 3.5–5.0)
Alkaline Phosphatase: 77 U/L (ref 38–126)
BUN: 24 mg/dL — ABNORMAL HIGH (ref 6–20)
CHLORIDE: 116 mmol/L — AB (ref 101–111)
CO2: 19 mmol/L — AB (ref 22–32)
Calcium: 8.5 mg/dL — ABNORMAL LOW (ref 8.9–10.3)
Creatinine, Ser: 1.04 mg/dL — ABNORMAL HIGH (ref 0.44–1.00)
GFR calc non Af Amer: 46 mL/min — ABNORMAL LOW (ref >60)
GFR, EST AFRICAN AMERICAN: 54 mL/min — AB (ref >60)
Glucose, Bld: 129 mg/dL — ABNORMAL HIGH (ref 65–99)
Potassium: 3.7 mmol/L (ref 3.5–5.1)
SODIUM: 142 mmol/L (ref 135–145)
Total Bilirubin: 0.7 mg/dL (ref 0.3–1.2)
Total Protein: 5.6 g/dL — ABNORMAL LOW (ref 6.5–8.1)

## 2017-04-19 MED ORDER — CHLORHEXIDINE GLUCONATE CLOTH 2 % EX PADS
6.0000 | MEDICATED_PAD | Freq: Every day | CUTANEOUS | Status: DC
  Administered 2017-04-19 – 2017-04-20 (×2): 6 via TOPICAL

## 2017-04-19 MED ORDER — ENSURE ENLIVE PO LIQD
237.0000 mL | Freq: Three times a day (TID) | ORAL | Status: DC
  Administered 2017-04-19 – 2017-04-20 (×2): 237 mL via ORAL

## 2017-04-19 MED ORDER — MUPIROCIN 2 % EX OINT
1.0000 "application " | TOPICAL_OINTMENT | Freq: Two times a day (BID) | CUTANEOUS | Status: DC
  Administered 2017-04-19 – 2017-04-20 (×2): 1 via NASAL
  Filled 2017-04-19: qty 22

## 2017-04-19 NOTE — Progress Notes (Signed)
[place] at [facility]                                                                                                                                                                                  Patient Demographics   Angela Howe, is a 83 y.o. female, DOB - Dec 28, 1925, ZTI:[id number]  Admit date - 04/17/2017   Admitting Physician Gladstone Lighter, MD  Outpatient Primary MD for the patient is Einar Pheasant, MD   LOS - 2  Subjective: Patient had her surgery currently comfortable not in any pain    Review of Systems:   CONSTITUTIONAL: Confused  Vitals:   Vitals:   04/19/17 0337 04/19/17 0728 04/19/17 1318 04/19/17 1320  BP: 119/66 137/71 136/75   Pulse: 98 (!) 105 (!) 47 (!) 57  Resp: 18 18 16    Temp: (!) 97.4 F (36.3 C) 98.3 F (36.8 C)    TempSrc: Axillary Oral    SpO2: 95% 97% 97%   Weight:      Height:        Wt Readings from Last 3 Encounters:  04/17/17 105 lb (47.6 kg)  02/24/17 103 lb (46.7 kg)  02/20/17 103 lb (46.7 kg)     Intake/Output Summary (Last 24 hours) at 04/19/2017 1510 Last data filed at 04/19/2017 0649 Gross per 24 hour  Intake 800 ml  Output 50 ml  Net 750 ml    Physical Exam:   GENERAL: Pleasant-appearing in no apparent distress.  HEAD, EYES, EARS, NOSE AND THROAT: Atraumatic, normocephalic. Extraocular muscles are intact. Pupils equal and reactive to light. Sclerae anicteric. No conjunctival injection. No oro-pharyngeal erythema.  NECK: Supple. There is no jugular venous distention. No bruits, no lymphadenopathy, no thyromegaly.  HEART: Regular rate and rhythm,. No murmurs, no rubs, no clicks.  LUNGS: Clear to auscultation bilaterally. No rales or rhonchi. No wheezes.  ABDOMEN: Soft, flat, nontender, nondistended. Has good bowel sounds. No hepatosplenomegaly appreciated.  EXTREMITIES: No evidence of any cyanosis, clubbing, or peripheral edema.  +2 pedal and radial pulses bilaterally.  NEUROLOGIC:  Confused SKIN: Moist and warm with no rashes appreciated.  Psych: Confused LN: No inguinal LN enlargement    Antibiotics   Anti-infectives (From admission, onward)   Start     Dose/Rate Route Frequency Ordered Stop   04/18/17 2030  ceFAZolin (ANCEF) 1 g in dextrose 5 % 50 mL injection      Intravenous Every 6 hours 04/18/17 1706 04/19/17 0735   04/18/17 1715  ceFAZolin (ANCEF) IVPB 1 g/50 mL premix  Status:  Discontinued     1 g 100 mL/hr over 30 Minutes Intravenous Every 6 hours 04/18/17 1703 04/18/17 1705  04/18/17 1000  ceFAZolin (ANCEF) 1 g in dextrose 5 % 50 mL IVPB     1 g 100 mL/hr over 30 Minutes Intravenous  Once 04/18/17 0927 04/18/17 1505   04/18/17 0915  ceFAZolin (ANCEF) powder 1 g  Status:  Discontinued     1 g Other To Surgery 04/18/17 0907 04/18/17 0927      Medications   Scheduled Meds: . amitriptyline  25 mg Oral QHS  . aspirin  81 mg Oral Daily  . calcium-vitamin D  2 tablet Oral BID  . citalopram  20 mg Oral Daily  . docusate  150 mg Oral BID  . feeding supplement (ENSURE ENLIVE)  237 mL Oral TID BM  . fentaNYL  12.5 mcg Transdermal Q72H  . fludrocortisone  0.1 mg Oral Daily  . levothyroxine  50 mcg Oral QAC breakfast  . lidocaine  2 patch Transdermal Q24H  . megestrol  625 mg Oral Daily  . metoprolol tartrate  2.5 mg Intravenous Q6H  . multivitamin with minerals  1 tablet Oral Daily  . nitroGLYCERIN  0.5 inch Topical Q6H  . pantoprazole  40 mg Oral Daily  . QUEtiapine  12.5 mg Oral QHS  . senna  1 tablet Oral BID   Continuous Infusions: . lactated ringers     PRN Meds:.acetaminophen **OR** acetaminophen, HYDROcodone-acetaminophen, HYDROcodone-acetaminophen, metoCLOPramide **OR** metoCLOPramide (REGLAN) injection, morphine injection, morphine injection, nitroGLYCERIN, ondansetron **OR** ondansetron (ZOFRAN) IV, oxyCODONE, polyethylene glycol   Data Review:   Micro Results Recent Results (from the past 240 hour(s))  Surgical pcr screen      Status: Abnormal   Collection Time: 04/17/17 10:12 PM  Result Value Ref Range Status   MRSA, PCR NEGATIVE NEGATIVE Final   Staphylococcus aureus POSITIVE (A) NEGATIVE Final    Comment: (NOTE) The Xpert SA Assay (FDA approved for NASAL specimens in patients 24 years of age and older), is one component of a comprehensive surveillance program. It is not intended to diagnose infection nor to guide or monitor treatment.     Radiology Reports Dg Chest 1 View  Result Date: 04/17/2017 CLINICAL DATA:  Fall with left hip pain EXAM: CHEST 1 VIEW COMPARISON:  02/20/2017 FINDINGS: Post sternotomy changes. Small left pleural effusion. Partial consolidation in the left lower lobe. Stable cardiomediastinal silhouette with aortic atherosclerosis. No pneumothorax. Postsurgical changes at the right humeral head parent IMPRESSION: 1. Small left pleural effusion with partial consolidation in the left lower lobe which may reflect atelectasis or an infiltrate 2. Borderline cardiomegaly. Electronically Signed   By: Donavan Foil M.D.   On: 04/17/2017 17:48   Ct Head Wo Contrast  Result Date: 04/17/2017 CLINICAL DATA:  Unwitnessed fall hematoma to back of head EXAM: CT HEAD WITHOUT CONTRAST CT CERVICAL SPINE WITHOUT CONTRAST TECHNIQUE: Multidetector CT imaging of the head and cervical spine was performed following the standard protocol without intravenous contrast. Multiplanar CT image reconstructions of the cervical spine were also generated. COMPARISON:  02/24/2017, 10/01/2007, CT chest 02/21/2017 FINDINGS: CT HEAD FINDINGS Brain: No acute territorial infarction, hemorrhage or intracranial mass is visualized. Old lacunar infarct in the left caudate. Moderate small vessel ischemic changes of the white matter. Moderate atrophy. Stable ventricle size. Vascular: No hyperdense vessels.  Carotid artery calcification Skull: No depressed skull fracture. Sinuses/Orbits: Old nasal bone deformity. Probable old deformity  anterior wall left maxillary sinus. No acute orbital abnormality Other: Moderate left posterior parietal scalp hematoma. CT CERVICAL SPINE FINDINGS Alignment: No subluxation.  Facet alignment is within normal limits. Skull base  and vertebrae: Craniovertebral junction is intact. Superior endplate deformity at T2 and T3, stable since October 2018 CT. Soft tissues and spinal canal: No prevertebral fluid or swelling. No visible canal hematoma. Disc levels:  Multilevel degenerative changes, moderate at C5-C6. Upper chest: Carotid artery calcification. Other: None IMPRESSION: 1. No CT evidence for acute intracranial abnormality. Atrophy and small vessel ischemic changes of the white matter. Moderate left posterior scalp hematoma 2. Degenerative changes of the cervical spine. No acute fracture is seen Electronically Signed   By: Donavan Foil M.D.   On: 04/17/2017 18:32   Ct Cervical Spine Wo Contrast  Result Date: 04/17/2017 CLINICAL DATA:  Unwitnessed fall hematoma to back of head EXAM: CT HEAD WITHOUT CONTRAST CT CERVICAL SPINE WITHOUT CONTRAST TECHNIQUE: Multidetector CT imaging of the head and cervical spine was performed following the standard protocol without intravenous contrast. Multiplanar CT image reconstructions of the cervical spine were also generated. COMPARISON:  02/24/2017, 10/01/2007, CT chest 02/21/2017 FINDINGS: CT HEAD FINDINGS Brain: No acute territorial infarction, hemorrhage or intracranial mass is visualized. Old lacunar infarct in the left caudate. Moderate small vessel ischemic changes of the white matter. Moderate atrophy. Stable ventricle size. Vascular: No hyperdense vessels.  Carotid artery calcification Skull: No depressed skull fracture. Sinuses/Orbits: Old nasal bone deformity. Probable old deformity anterior wall left maxillary sinus. No acute orbital abnormality Other: Moderate left posterior parietal scalp hematoma. CT CERVICAL SPINE FINDINGS Alignment: No subluxation.  Facet  alignment is within normal limits. Skull base and vertebrae: Craniovertebral junction is intact. Superior endplate deformity at T2 and T3, stable since October 2018 CT. Soft tissues and spinal canal: No prevertebral fluid or swelling. No visible canal hematoma. Disc levels:  Multilevel degenerative changes, moderate at C5-C6. Upper chest: Carotid artery calcification. Other: None IMPRESSION: 1. No CT evidence for acute intracranial abnormality. Atrophy and small vessel ischemic changes of the white matter. Moderate left posterior scalp hematoma 2. Degenerative changes of the cervical spine. No acute fracture is seen Electronically Signed   By: Donavan Foil M.D.   On: 04/17/2017 18:32   Dg Hip Operative Unilat W Or W/o Pelvis Left  Result Date: 04/18/2017 CLINICAL DATA:  Left intertrochanteric fracture repair EXAM: OPERATIVE LEFT HIP (WITH PELVIS IF PERFORMED) 3 VIEWS TECHNIQUE: Fluoroscopic spot image(s) were submitted for interpretation post-operatively. COMPARISON:  None FLUOROSCOPY TIME:  47 seconds FINDINGS: Left intertrochanteric fracture transfixed with an intramedullary nail and interlocking cannulated femoral neck screw without hardware failure or complication. Mild persistent displacement. IMPRESSION: Interval left intertrochanteric fracture ORIF. Electronically Signed   By: Kathreen Devoid   On: 04/18/2017 16:10   Dg Hip Unilat W Or Wo Pelvis 2-3 Views Left  Result Date: 04/17/2017 CLINICAL DATA:  Fall with hip pain EXAM: DG HIP (WITH OR WITHOUT PELVIS) 2-3V LEFT COMPARISON:  CT 02/21/2017 FINDINGS: Treated compression fractures of the lower lumbar spine partially visible. Bones appear osteopenic. Pubic symphysis and rami appear intact. Advanced arthritis of the right hip with mild remodeling of the femoral head, joint space narrowing, sclerosis. Acute, comminuted and impacted intertrochanteric fracture. Left femoral head appears located. Apex anterior angulation on cross-table lateral view.  IMPRESSION: Acute, comminuted left intertrochanteric fracture. Electronically Signed   By: Donavan Foil M.D.   On: 04/17/2017 17:51     CBC Recent Labs  Lab 04/17/17 1707 04/18/17 0554 04/19/17 1038  WBC 9.2 8.3 7.3  HGB 7.5* 12.6 9.7*  HCT 23.0* 37.3 29.5*  PLT 327 260 227  MCV 83.2 84.4 84.7  MCH 27.0  28.6 28.0  MCHC 32.5 33.9 33.0  RDW 18.1* 16.2* 16.5*  LYMPHSABS  --   --  1.3  MONOABS  --   --  0.9  EOSABS  --   --  0.0  BASOSABS  --   --  0.0    Chemistries  Recent Labs  Lab 04/17/17 1707 04/18/17 0554 04/19/17 1038  NA 140 140 142  K 3.9 4.2 3.7  CL 111 109 116*  CO2 21* 21* 19*  GLUCOSE 108* 111* 129*  BUN 31* 29* 24*  CREATININE 1.31* 0.99 1.04*  CALCIUM 8.6* 8.8* 8.5*  AST  --   --  70*  ALT  --   --  42  ALKPHOS  --   --  77  BILITOT  --   --  0.7   ------------------------------------------------------------------------------------------------------------------ estimated creatinine clearance is 27.6 mL/min (A) (by C-G formula based on SCr of 1.04 mg/dL (H)). ------------------------------------------------------------------------------------------------------------------ No results for input(s): HGBA1C in the last 72 hours. ------------------------------------------------------------------------------------------------------------------ No results for input(s): CHOL, HDL, LDLCALC, TRIG, CHOLHDL, LDLDIRECT in the last 72 hours. ------------------------------------------------------------------------------------------------------------------ No results for input(s): TSH, T4TOTAL, T3FREE, THYROIDAB in the last 72 hours.  Invalid input(s): FREET3 ------------------------------------------------------------------------------------------------------------------ No results for input(s): VITAMINB12, FOLATE, FERRITIN, TIBC, IRON, RETICCTPCT in the last 72 hours.  Coagulation profile Recent Labs  Lab 04/17/17 1945  INR 1.12    No results for input(s):  DDIMER in the last 72 hours.  Cardiac Enzymes No results for input(s): CKMB, TROPONINI, MYOGLOBIN in the last 168 hours.  Invalid input(s): CK ------------------------------------------------------------------------------------------------------------------ Invalid input(s): [place]   Angela Howe  is a 83 y.o. female with a known history of dementia, failure to thrive. His wheelchair bound at baseline, CAD, GERD, hypothyroidism who has had prior falls from a wheelchair presents to the hospital secondary to an unwitnessed fall today and noted to have left intertrochanteric fracture.  1. Acute left intertrochanteric fracture- secondary to mechanical fall. -Continue pain control Status post repair  2. Anemia-worsening anemia of chronic disease.  Status post transfusion hemoglobin stable Repeat CBC in the morning  3. Chronic low back pain-continue fentanyl patch and lidoderm patch  4. Hypothyroidism-continue Synthroid  5. DVT prophylaxis-per surgery         Code Status Orders  (From admission, onward)        Start     Ordered   04/17/17 2112  Do not attempt resuscitation (DNR)  Continuous    Question Answer Comment  In the event of cardiac or respiratory ARREST Do not call a "code blue"   In the event of cardiac or respiratory ARREST Do not perform Intubation, CPR, defibrillation or ACLS   In the event of cardiac or respiratory ARREST Use medication by any route, position, wound care, and other measures to relive pain and suffering. May use oxygen, suction and manual treatment of airway obstruction as needed for comfort.      04/17/17 2111    Code Status History    Date Active Date Inactive Code Status Order ID Comments User Context   02/20/2017 15:22 02/23/2017 17:44 DNR [id number]  Henreitta Leber, MD Inpatient   12/21/2016 16:14 12/24/2016 17:20 Full Code [id number]  Epifanio Lesches, MD ED   12/21/2016 15:47 12/21/2016 16:14 DNR [id number]   Epifanio Lesches, MD ED   12/21/2016 15:46 12/21/2016 15:47 Full Code [id number]  Epifanio Lesches, MD ED   10/01/2015 17:34 10/02/2015 18:18 DNR [id number]  Loletha Grayer, MD ED    Advance  Directive Documentation     Most Recent Value  Type of Advance Directive  Healthcare Power of Attorney  Pre-existing out of facility DNR order (yellow form or pink MOST form)  No data  "MOST" Form in Place?  No data           Consults orthopedic   DVT Prophylaxis  Lovenox recommended  Lab Results  Component Value Date   PLT 227 04/19/2017     Time Spent in minutes   35 minutes  Greater than 50% of time spent in care coordination and counseling patient regarding the condition and plan of care.   Dustin Flock M.D on 04/19/2017 at 3:10 PM  Between 7am to 6pm - Pager - [phone]  After 6pm go to [email or web address] - password [email or web address] [facility]   Office  [phone]

## 2017-04-19 NOTE — Clinical Social Work Note (Signed)
Clinical Social Work Assessment  Patient Details  Name: Angela Howe MRN: [id number] Date of Birth: 11-16-1925  Date of referral:  04/19/17               Reason for consult:  Other (Comment Required)(From [facility])                Permission sought to share information with:  Chartered certified accountant granted to share information::  Yes, Verbal Permission Granted  Name::      [facility]iation protection practitioner SNF LTC  Agency::     Relationship::     Contact Information:     Housing/Transportation Living arrangements for the past 2 months:  Emigrant of Information:  Adult Children Patient Interpreter Needed:  None Criminal Activity/Legal Involvement Pertinent to Current Situation/Hospitalization:  No - Comment as needed Significant Relationships:  Adult Children Lives with:  Facility Resident Do you feel safe going back to the place where you live?  Yes Need for family participation in patient care:  Yes (Comment)  Care giving concerns:  Patient is a long term care resident at [facility] and is followed by [place]/ [facility].    Social Worker assessment / plan:  Holiday representative ([place]) reviewed chart and noted that patient is from [facility]. Per [facility] admissions coordinator at [facility] patient is a long term care resident and is open to hospice. Per Magda Paganini patient can return to [facility] when medically stable. Per PT patient is not appropriate for rehab. CSW met with patient's daughter Angela Howe at bedside and made her aware of above. Angela Howe asked if patient can be moved to another facility. Per Angela Howe patient has medicaid pending. CSW explained that with medicaid pending no other facility will accept her until she has medicaid in place. Per Angela Howe patient is doing a spend down until she qualifies for medicaid. Per Angela Howe she has an attorney working on this. CSW also made Angela Howe aware that if patient had medicaid in  place securing a new facility would still be difficult. CSW explained that medicaid beds are limited. Angela Howe is agreeable for patient to return to [facility] under long term care and continue hospice. [facility] hospice liaison is aware of above. CSW will continue to follow and assist as needed.   Employment status:  Disabled (Comment on whether or not currently receiving Disability), Retired Insurance underwriter information:  Other (Comment Required)(Hospice. ) PT Recommendations:  No Follow Up Information / Referral to community resources:  [place]  Patient/Family's Response to care:  Patient's daughter Angela Howe is agreeable for patient to return to [facility].   Patient/Family's Understanding of and Emotional Response to Diagnosis, Current Treatment, and Prognosis:  Patient's daughter was pleasant and thanked CSW for assistance.   Emotional Assessment Appearance:  Appears stated age Attitude/Demeanor/Rapport:  Unable to Assess Affect (typically observed):  Unable to Assess Orientation:  Oriented to Self, Fluctuating Orientation (Suspected and/or reported Sundowners) Alcohol / Substance use:  Not Applicable Psych involvement (Current and /or in the community):  No (Comment)  Discharge Needs  Concerns to be addressed:  Discharge Planning Concerns Readmission within the last 30 days:  No Current discharge risk:  Dependent with Mobility, Cognitively Impaired, Chronically ill Barriers to Discharge:  Continued Medical Work up   [employer], Veronia Beets, LCSW 04/19/2017, 3:47 PM

## 2017-04-19 NOTE — Consult Note (Signed)
   [facility] CM Inpatient Consult   04/19/2017  [name] [birth year] [id number]   Patient screened for potential [facility] services. Patient is on the [facility] registry as a benefit of their [employer] . Electronic medical record reveals patient's discharge plan is to return to assisted living with Hospice care,  there were no identifiable [facility] care management needs. [facility] Care Management services not appropriate at this time. If patient's post hospital needs change please place a [facility] Care Management consult. For questions please contact:   Katye Valek RN, [facility] Liaison  [phone]) Business Mobile [phone]) Toll free office

## 2017-04-19 NOTE — Evaluation (Signed)
Physical Therapy Evaluation Patient Details Name: Angela Howe MRN: [id number] DOB: 1925/06/02 Today's Date: 04/19/2017   History of Present Illness  83 y/o female who had fall and subsequent L hip fx.  Had nailing 12/19.  Pt with considerable dementia and is apparently w/c bound at baseline.   Clinical Impression  Pt struggled to say her name, but ultimately is very cognitively limited with participation with PT exam and was resistant to most acts.  She c/o pain with the ~10 minutes of L LE P/AAROM exercises and took a lot of convincing to try getting to EOB and ultimately she refused and we never got to any sustained sitting EOB secondary to c/o pain and general resistance.  Unsure how appropriate pt would be for rehab given history and inability/willingness to participate with PT exam today.     Follow Up Recommendations SNF(pt on hospice at baseline, unable to truly participate)    Equipment Recommendations       Recommendations for Other Services       Precautions / Restrictions Precautions Precautions: Fall Restrictions Weight Bearing Restrictions: Yes LLE Weight Bearing: Weight bearing as tolerated      Mobility  Bed Mobility Overal bed mobility: Needs Assistance Bed Mobility: Supine to Sit;Sit to Supine     Supine to sit: Max assist Sit to supine: Max assist   General bed mobility comments: Pt resistant to getting to sitting, with heavy cuing she showed some effort but ultimately was resistant and could not ultimately get up to sitting despite heavy encouragement and assist  Transfers                    Ambulation/Gait                Stairs            Wheelchair Mobility    Modified Rankin (Stroke Patients Only)       Balance Overall balance assessment: Needs assistance   Sitting balance-Leahy Scale: Zero Sitting balance - Comments: Pt resistant to getting to/maintaining sitting EOB secondary to pain (?)                                      Pertinent Vitals/Pain Pain Assessment: (unable to rate, calls out in pain with any L LE mvt)    Home Living Family/patient expects to be discharged to:: Skilled nursing facility                 Additional Comments: Pt on hospice at [facility] (?), plan it to return.    Prior Function Level of Independence: Needs assistance         Comments: apparently pt is w/c bound, pt unable to answer any questions      Hand Dominance        Extremity/Trunk Assessment   Upper Extremity Assessment Upper Extremity Assessment: Difficult to assess due to impaired cognition;Generalized weakness    Lower Extremity Assessment Lower Extremity Assessment: Generalized weakness;Difficult to assess due to impaired cognition(pain with all L LE movement)       Communication   Communication: Expressive difficulties  Cognition Arousal/Alertness: Lethargic Behavior During Therapy: Impulsive;Agitated Overall Cognitive Status: History of cognitive impairments - at baseline  General Comments      Exercises General Exercises - Lower Extremity Ankle Circles/Pumps: AAROM;PROM;10 reps Heel Slides: PROM;5 reps Hip ABduction/ADduction: PROM;5 reps   Assessment/Plan    PT Assessment Patient needs continued PT services  PT Problem List Decreased strength;Decreased range of motion;Decreased activity tolerance;Decreased balance;Decreased mobility;Decreased coordination;Decreased cognition;Decreased safety awareness;Decreased knowledge of use of DME;Pain       PT Treatment Interventions DME instruction;Gait training;Stair training;Functional mobility training;Therapeutic activities;Balance training;Therapeutic exercise;Cognitive remediation;Neuromuscular re-education    PT Goals (Current goals can be found in the Care Plan section)  Acute Rehab PT Goals Patient Stated Goal: unable to state PT Goal  Formulation: Patient unable to participate in goal setting    Frequency Min 2X/week   Barriers to discharge        Co-evaluation               AM-PAC PT "6 Clicks" Daily Activity  Outcome Measure Difficulty turning over in bed (including adjusting bedclothes, sheets and blankets)?: Unable Difficulty moving from lying on back to sitting on the side of the bed? : Unable Difficulty sitting down on and standing up from a chair with arms (e.g., wheelchair, bedside commode, etc,.)?: Unable Help needed moving to and from a bed to chair (including a wheelchair)?: Total Help needed walking in hospital room?: Total Help needed climbing 3-5 steps with a railing? : Total 6 Click Score: 6    End of Session   Activity Tolerance: Patient limited by pain;Treatment limited secondary to agitation Patient left: with bed alarm set;with call bell/phone within reach Nurse Communication: Mobility status PT Visit Diagnosis: Muscle weakness (generalized) (M62.81);Difficulty in walking, not elsewhere classified (R26.2);Pain Pain - Right/Left: Left Pain - part of body: Hip    Time: 0915-0939 PT Time Calculation (min) (ACUTE ONLY): 24 min   Charges:   PT Evaluation $PT Eval Low Complexity: 1 Low PT Treatments $Therapeutic Exercise: 8-22 mins   PT G Codes:   PT G-Codes **NOT FOR INPATIENT CLASS** Functional Assessment Tool Used: AM-PAC 6 Clicks Basic Mobility Functional Limitation: Mobility: Walking and moving around Mobility: Walking and Moving Around Current Status (S4967): 100 percent impaired, limited or restricted Mobility: Walking and Moving Around Goal Status (R9163): At least 60 percent but less than 80 percent impaired, limited or restricted    Kreg Shropshire, DPT 04/19/2017, 11:04 AM

## 2017-04-19 NOTE — Progress Notes (Signed)
Initial Nutrition Assessment  DOCUMENTATION CODES:   Severe malnutrition in context of chronic illness  INTERVENTION:   Ensure Enlive po BID, each supplement provides 350 kcal and 20 grams of protein  MVI daily   Magic cup TID with meals, each supplement provides 290 kcal and 9 grams of protein  Recommend 2000 IU vitamin D daily po  Recommend check Mg and P labs   NUTRITION DIAGNOSIS:   Severe Malnutrition related to chronic illness(heart disease, dementia, FTT) as evidenced by severe muscle depletion, severe fat depletion.  GOAL:   Patient will meet greater than or equal to 90% of their needs  MONITOR:   PO intake, Supplement acceptance, Labs, Weight trends, I & O's, Skin  REASON FOR ASSESSMENT:   Other (Comment)(low BMI)    ASSESSMENT:   83 y.o. female with a known history of dementia, failure to thrive. She is wheelchair bound at baseline, CAD, GERD, hypothyroidism who has had prior falls from a wheelchair and followed by home hospice presents to the hospital secondary to an unwitnessed fall and noted to have left intertrochanteric fracture. Now s/p hip repair 12/19   Visited pt's room today. Unable to obtain nutrition related history as pt is a poor historian. Per chart, pt is followed by [place] [facility]. Per chart, pt is weight stable. RD will order supplements to encourage wound healing. Pt had breakfast tray on her side table which was barely touched. RD suspects pt with poor oral intake at baseline. Pt noted to have low vitamin D. Pt getting Oscal but would recommend 2000 IU vitamin D daily to support bone health and replete vitamin D. Would recommend check Mg and P levels as well.   Medications reviewed and include: aspirin, oscal, celexa, colace, fentanyl, megace, MVI, senokot, miralax   Labs reviewed: BUN 29(H), Ca 8.8(L) Vitamin D, 25- hydroxy- 9.9(L)  Nutrition-Focused physical exam completed. Findings are severe fat and  muscle depletions over entire body, and no edema.   Diet Order:  DIET SOFT Room service appropriate? Yes; Fluid consistency: Thin  EDUCATION NEEDS:   Not appropriate for education at this time  Skin:  Skin Assessment: (Incision hip )  Last BM:  PTA  Height:   Ht Readings from Last 1 Encounters:  04/17/17 5\' 5"  (1.651 m)    Weight:   Wt Readings from Last 1 Encounters:  04/17/17 105 lb (47.6 kg)    Ideal Body Weight:  56.8 kg  BMI:  Body mass index is 17.47 kg/m.  Estimated Nutritional Needs:   Kcal:  1200-1400kcal/day   Protein:  71-81g/day   Fluid:  1.4L/day   Koleen Distance MS, RD, LDN Pager #[phone] After Hours Pager: [phone]

## 2017-04-19 NOTE — NC FL2 (Signed)
[place] LEVEL OF CARE SCREENING TOOL     IDENTIFICATION  Patient Name: Angela Howe Birthdate: 10-Jan-1926 Sex: female Admission Date (Current Location): 04/17/2017  [place] and [place] Number:  Engineering geologist and Address:  [facility], [address], [place], [place] [address]      Provider Number: [id number]  Attending Physician Name and Address:  Dustin Flock, MD  Relative Name and Phone Number:       Current Level of Care: Hospital Recommended Level of Care: [place] Prior Approval Number:    Date Approved/Denied:   PASRR Number: [id number])  Discharge Plan: SNF    Current Diagnoses: Patient Active Problem List   Diagnosis Date Noted  . Hip fracture ([place]) 04/17/2017  . Subacute delirium 02/21/2017  . Palliative care by specialist   . Advance care planning   . Goals of care, counseling/discussion   . Dehydration 02/20/2017  . Pancolitis ([place]) 12/21/2016  . Unsteady gait 08/13/2016  . Falls frequently 07/04/2016  . Chest pain 10/12/2015  . Rib fracture 10/03/2015  . Right carotid bruit 08/01/2015  . Loss of weight 01/12/2015  . Anemia 07/18/2014  . Health care maintenance 07/18/2014  . Hoarseness 07/18/2014  . Abdominal pain 05/05/2014  . Back pain 10/05/2013  . Knee pain 07/14/2013  . Oral pain 05/06/2013  . Itching 01/05/2013  . History of breast cancer 11/20/2012  . Syncope 11/20/2012  . Hypothyroidism 08/10/2012  . Difficulty sleeping 08/10/2012  . Dizziness 11/08/2010  . Hyperlipemia 03/23/2009  . HYPERTENSION, BENIGN 03/23/2009  . CAD, NATIVE VESSEL 09/24/2008  . ORTHOSTATIC HYPOTENSION 09/24/2008  . ABNORMAL EKG 09/24/2008    Orientation RESPIRATION BLADDER Height & Weight     Self  Normal Continent Weight: 105 lb (47.6 kg) Height:  5\' 5"  (165.1 cm)  BEHAVIORAL SYMPTOMS/MOOD NEUROLOGICAL BOWEL NUTRITION STATUS      Continent Diet(Soft Diet. )  AMBULATORY STATUS  COMMUNICATION OF NEEDS Skin   Extensive Assist Verbally Surgical wounds(Incision: Left Hip. )                       Personal Care Assistance Level of Assistance  Bathing, Feeding, Dressing Bathing Assistance: Limited assistance Feeding assistance: Independent Dressing Assistance: Limited assistance     Functional Limitations Info  Sight, Hearing, Speech Sight Info: Adequate Hearing Info: Adequate Speech Info: Adequate    SPECIAL CARE FACTORS FREQUENCY  (Followed by [facility]/ [facility]. )                    Contractures      Additional Factors Info  Code Status, Allergies Code Status Info: (DNR ) Allergies Info: (Alendronate Sodium, Prednisone)           Current Medications (04/19/2017):  This is the current hospital active medication list Current Facility-Administered Medications  Medication Dose Route Frequency Provider Last Rate Last Dose  . acetaminophen (TYLENOL) tablet 650 mg  650 mg Oral Q6H PRN Gladstone Lighter, MD   650 mg at 04/19/17 5277   Or  . acetaminophen (TYLENOL) suppository 650 mg  650 mg Rectal Q6H PRN Gladstone Lighter, MD      . amitriptyline (ELAVIL) tablet 25 mg  25 mg Oral QHS Gladstone Lighter, MD   25 mg at 04/18/17 2120  . aspirin chewable tablet 81 mg  81 mg Oral Daily Lovell Sheehan, MD   81 mg at 04/19/17 8242  . calcium-vitamin D (OSCAL WITH D) 500-200  MG-UNIT per tablet 2 tablet  2 tablet Oral BID Gladstone Lighter, MD   2 tablet at 04/19/17 [phone]  . citalopram (CELEXA) tablet 20 mg  20 mg Oral Daily Gladstone Lighter, MD   20 mg at 04/19/17 2706  . docusate (COLACE) 50 MG/5ML liquid 150 mg  150 mg Oral BID Gladstone Lighter, MD   150 mg at 04/19/17 0736  . feeding supplement (ENSURE ENLIVE) (ENSURE ENLIVE) liquid 237 mL  237 mL Oral TID BM Dustin Flock, MD      . fentaNYL ([place] - dosed mcg/hr) 12.5 mcg  12.5 mcg Transdermal Q72H Gladstone Lighter, MD   12.5 mcg at 04/19/17 0730  . fludrocortisone (FLORINEF)  tablet 0.1 mg  0.1 mg Oral Daily Gladstone Lighter, MD   0.1 mg at 04/19/17 0737  . HYDROcodone-acetaminophen (NORCO/VICODIN) 5-325 MG per tablet 1 tablet  1 tablet Oral Q4H PRN Lovell Sheehan, MD      . HYDROcodone-acetaminophen (NORCO/VICODIN) 5-325 MG per tablet 2 tablet  2 tablet Oral Q4H PRN Lovell Sheehan, MD      . lactated ringers infusion   Intravenous Continuous Lovell Sheehan, MD      . levothyroxine (SYNTHROID, LEVOTHROID) tablet 50 mcg  50 mcg Oral QAC breakfast Gladstone Lighter, MD   50 mcg at 04/19/17 [phone]  . lidocaine (LIDODERM) 5 % 2 patch  2 patch Transdermal Q24H Gladstone Lighter, MD   2 patch at 04/19/17 0735  . megestrol (MEGACE) 400 MG/10ML suspension 625 mg  625 mg Oral Daily Gladstone Lighter, MD   625 mg at 04/19/17 0737  . metoCLOPramide (REGLAN) tablet 5-10 mg  5-10 mg Oral Q8H PRN Lovell Sheehan, MD       Or  . metoCLOPramide (REGLAN) injection 5-10 mg  5-10 mg Intravenous Q8H PRN Lovell Sheehan, MD      . metoprolol tartrate (LOPRESSOR) injection 2.5 mg  2.5 mg Intravenous Q6H Salary, Montell D, MD   2.5 mg at 04/19/17 0055  . morphine 2 MG/ML injection 1 mg  1 mg Intravenous Q2H PRN Lovell Sheehan, MD      . morphine 2 MG/ML injection 2 mg  2 mg Intravenous Q4H PRN Gladstone Lighter, MD   2 mg at 04/18/17 2301  . multivitamin with minerals tablet 1 tablet  1 tablet Oral Daily Gladstone Lighter, MD   1 tablet at 04/19/17 [phone]  . nitroGLYCERIN (NITROGLYN) 2 % ointment 0.5 inch  0.5 inch Topical Q6H Harrie Foreman, MD   0.5 inch at 04/19/17 0532  . nitroGLYCERIN (NITROSTAT) SL tablet 0.4 mg  0.4 mg Sublingual Q5 min PRN Gladstone Lighter, MD      . ondansetron [facility]) tablet 4 mg  4 mg Oral Q6H PRN Gladstone Lighter, MD       Or  . ondansetron (ZOFRAN) injection 4 mg  4 mg Intravenous Q6H PRN Gladstone Lighter, MD      . oxyCODONE (Oxy IR/ROXICODONE) immediate release tablet 5 mg  5 mg Oral Q4H PRN Gladstone Lighter, MD      . pantoprazole  (PROTONIX) EC tablet 40 mg  40 mg Oral Daily Gladstone Lighter, MD   40 mg at 04/19/17 0732  . polyethylene glycol (MIRALAX / GLYCOLAX) packet 17 g  17 g Oral Daily PRN Gladstone Lighter, MD   17 g at 04/19/17 0735  . QUEtiapine (SEROQUEL) tablet 12.5 mg  12.5 mg Oral QHS Gladstone Lighter, MD   12.5 mg at 04/18/17 2120  . senna (SENOKOT)  tablet 8.6 mg  1 tablet Oral BID Lovell Sheehan, MD   8.6 mg at 04/19/17 7169     Discharge Medications: Please see discharge summary for a list of discharge medications.  Relevant Imaging Results:  Relevant Lab Results:   Additional Information (SSN: [id number])  Layla Gramm, Veronia Beets, LCSW

## 2017-04-19 NOTE — Progress Notes (Signed)
Visit made. Patient seen lying in bed, daughter [name] at bedside. Patient is post op day one. She was very resistant to physical therapy today and PT is not recommending skilled rehab. Writer discussed this with [name] and the plan is for patient to return to [facility] long term care and continue with hospice services. Patient was grimacing through out the visit, she complained of pain in her right foot. Staff RN Velna Hatchet notified and gave PRN medication. Patient was able to drink her tea at lunch and ate bites of ice cream per Wanda's report, she also drank a glass of tea given during visit. Will continue to follow and update hospice team.  Flo Shanks RN, BSN, [facility] [place] [facility] [place], hospital liaison [phone]

## 2017-04-20 ENCOUNTER — Telehealth: Payer: Self-pay | Admitting: *Deleted

## 2017-04-20 LAB — COMPREHENSIVE METABOLIC PANEL
ALT: 15 U/L (ref 14–54)
AST: 51 U/L — AB (ref 15–41)
Albumin: 2.7 g/dL — ABNORMAL LOW (ref 3.5–5.0)
Alkaline Phosphatase: 72 U/L (ref 38–126)
Anion gap: 7 (ref 5–15)
BUN: 25 mg/dL — ABNORMAL HIGH (ref 6–20)
CHLORIDE: 116 mmol/L — AB (ref 101–111)
CO2: 19 mmol/L — AB (ref 22–32)
CREATININE: 0.92 mg/dL (ref 0.44–1.00)
Calcium: 8.4 mg/dL — ABNORMAL LOW (ref 8.9–10.3)
GFR calc non Af Amer: 54 mL/min — ABNORMAL LOW (ref >60)
Glucose, Bld: 112 mg/dL — ABNORMAL HIGH (ref 65–99)
Potassium: 3.5 mmol/L (ref 3.5–5.1)
SODIUM: 142 mmol/L (ref 135–145)
Total Bilirubin: 0.8 mg/dL (ref 0.3–1.2)
Total Protein: 5.7 g/dL — ABNORMAL LOW (ref 6.5–8.1)

## 2017-04-20 LAB — CBC
HEMATOCRIT: 27.8 % — AB (ref 35.0–47.0)
Hemoglobin: 9.1 g/dL — ABNORMAL LOW (ref 12.0–16.0)
MCH: 27.8 pg (ref 26.0–34.0)
MCHC: 32.8 g/dL (ref 32.0–36.0)
MCV: 84.7 fL (ref 80.0–100.0)
PLATELETS: 225 10*3/uL (ref 150–440)
RBC: 3.28 MIL/uL — AB (ref 3.80–5.20)
RDW: 16.6 % — ABNORMAL HIGH (ref 11.5–14.5)
WBC: 8.3 10*3/uL (ref 3.6–11.0)

## 2017-04-20 MED ORDER — SODIUM CHLORIDE 0.9 % IV SOLN
INTRAVENOUS | Status: DC
  Administered 2017-04-20: 09:00:00 via INTRAVENOUS

## 2017-04-20 MED ORDER — FENTANYL 12 MCG/HR TD PT72: 12.5000 ug | MEDICATED_PATCH | TRANSDERMAL | 0 refills | Status: AC

## 2017-04-20 MED ORDER — METOPROLOL TARTRATE 25 MG PO TABS: 25.0000 mg | ORAL_TABLET | Freq: Two times a day (BID) | ORAL | 11 refills | Status: AC

## 2017-04-20 MED ORDER — HYDROCODONE-ACETAMINOPHEN 5-325 MG PO TABS: 1.0000 | ORAL_TABLET | ORAL | 0 refills | Status: AC | PRN

## 2017-04-20 MED ORDER — ASPIRIN 81 MG PO CHEW
81.0000 mg | CHEWABLE_TABLET | Freq: Every day | ORAL | Status: AC
Start: 2017-04-21 — End: 2017-05-19

## 2017-04-20 MED ORDER — BISACODYL 10 MG RE SUPP
10.0000 mg | Freq: Once | RECTAL | Status: AC
  Administered 2017-04-20: 10 mg via RECTAL
  Filled 2017-04-20: qty 1

## 2017-04-20 MED ORDER — ENSURE ENLIVE PO LIQD: 237.0000 mL | Freq: Three times a day (TID) | ORAL | 12 refills | Status: DC

## 2017-04-20 MED ORDER — BISACODYL 10 MG RE SUPP
10.0000 mg | Freq: Once | RECTAL | 0 refills | Status: DC
Start: 2017-04-20 — End: 2017-04-20

## 2017-04-20 NOTE — Progress Notes (Signed)
Pt ready for d/c to SNF today per MD. Report called to [place] at [facility], all questions answered. Pt has not had a BM since prior to admission, dulcolax suppository given at 1245. After pt has BM, will set up EMS. Pt's daughter aware of discharge today.   [place], Jerry Caras

## 2017-04-20 NOTE — Progress Notes (Signed)
Subjective:  Patient appears to be in mild pain.  Objective:   VITALS:   Vitals:   04/19/17 1646 04/19/17 1702 04/19/17 2008 04/20/17 0406  BP: (!) 155/55 (!) 153/54 (!) 154/59 100/84  Pulse: 69 72 87 (!) 57  Resp: 18 16 19 19   Temp: 98.3 F (36.8 C)  99.3 F (37.4 C) 99.4 F (37.4 C)  TempSrc: Oral  Oral Oral  SpO2: 97% 93% 97% 95%  Weight:      Height:        PHYSICAL EXAM:  Dorsiflexion/Plantar flexion intact Incision: dressing C/D/I  LABS  Results for orders placed or performed during the hospital encounter of 04/17/17 (from the past 24 hour(s))  Comprehensive metabolic panel     Status: Abnormal   Collection Time: 04/19/17 10:38 AM  Result Value Ref Range   Sodium 142 135 - 145 mmol/L   Potassium 3.7 3.5 - 5.1 mmol/L   Chloride 116 (H) 101 - 111 mmol/L   CO2 19 (L) 22 - 32 mmol/L   Glucose, Bld 129 (H) 65 - 99 mg/dL   BUN 24 (H) 6 - 20 mg/dL   Creatinine, Ser 1.04 (H) 0.44 - 1.00 mg/dL   Calcium 8.5 (L) 8.9 - 10.3 mg/dL   Total Protein 5.6 (L) 6.5 - 8.1 g/dL   Albumin 2.8 (L) 3.5 - 5.0 g/dL   AST 70 (H) 15 - 41 U/L   ALT 42 14 - 54 U/L   Alkaline Phosphatase 77 38 - 126 U/L   Total Bilirubin 0.7 0.3 - 1.2 mg/dL   GFR calc non Af Amer 46 (L) >60 mL/min   GFR calc Af Amer 54 (L) >60 mL/min   Anion gap 7 5 - 15  CBC with Differential/Platelet     Status: Abnormal   Collection Time: 04/19/17 10:38 AM  Result Value Ref Range   WBC 7.3 3.6 - 11.0 K/uL   RBC 3.48 (L) 3.80 - 5.20 MIL/uL   Hemoglobin 9.7 (L) 12.0 - 16.0 g/dL   HCT 29.5 (L) 35.0 - 47.0 %   MCV 84.7 80.0 - 100.0 fL   MCH 28.0 26.0 - 34.0 pg   MCHC 33.0 32.0 - 36.0 g/dL   RDW 16.5 (H) 11.5 - 14.5 %   Platelets 227 150 - 440 K/uL   Neutrophils Relative % 69 %   Neutro Abs 5.0 1.4 - 6.5 K/uL   Lymphocytes Relative 18 %   Lymphs Abs 1.3 1.0 - 3.6 K/uL   Monocytes Relative 13 %   Monocytes Absolute 0.9 0.2 - 0.9 K/uL   Eosinophils Relative 0 %   Eosinophils Absolute 0.0 0 - 0.7 K/uL   Basophils Relative 0 %   Basophils Absolute 0.0 0 - 0.1 K/uL  Comprehensive metabolic panel     Status: Abnormal   Collection Time: 04/20/17  6:19 AM  Result Value Ref Range   Sodium 142 135 - 145 mmol/L   Potassium 3.5 3.5 - 5.1 mmol/L   Chloride 116 (H) 101 - 111 mmol/L   CO2 19 (L) 22 - 32 mmol/L   Glucose, Bld 112 (H) 65 - 99 mg/dL   BUN 25 (H) 6 - 20 mg/dL   Creatinine, Ser 0.92 0.44 - 1.00 mg/dL   Calcium 8.4 (L) 8.9 - 10.3 mg/dL   Total Protein 5.7 (L) 6.5 - 8.1 g/dL   Albumin 2.7 (L) 3.5 - 5.0 g/dL   AST 51 (H) 15 - 41 U/L   ALT 15 14 - 54  U/L   Alkaline Phosphatase 72 38 - 126 U/L   Total Bilirubin 0.8 0.3 - 1.2 mg/dL   GFR calc non Af Amer 54 (L) >60 mL/min   GFR calc Af Amer >60 >60 mL/min   Anion gap 7 5 - 15  CBC     Status: Abnormal   Collection Time: 04/20/17  6:19 AM  Result Value Ref Range   WBC 8.3 3.6 - 11.0 K/uL   RBC 3.28 (L) 3.80 - 5.20 MIL/uL   Hemoglobin 9.1 (L) 12.0 - 16.0 g/dL   HCT 27.8 (L) 35.0 - 47.0 %   MCV 84.7 80.0 - 100.0 fL   MCH 27.8 26.0 - 34.0 pg   MCHC 32.8 32.0 - 36.0 g/dL   RDW 16.6 (H) 11.5 - 14.5 %   Platelets 225 150 - 440 K/uL    Dg Hip Operative Unilat W Or W/o Pelvis Left  Result Date: 04/18/2017 CLINICAL DATA:  Left intertrochanteric fracture repair EXAM: OPERATIVE LEFT HIP (WITH PELVIS IF PERFORMED) 3 VIEWS TECHNIQUE: Fluoroscopic spot image(s) were submitted for interpretation post-operatively. COMPARISON:  None FLUOROSCOPY TIME:  47 seconds FINDINGS: Left intertrochanteric fracture transfixed with an intramedullary nail and interlocking cannulated femoral neck screw without hardware failure or complication. Mild persistent displacement. IMPRESSION: Interval left intertrochanteric fracture ORIF. Electronically Signed   By: Kathreen Devoid   On: 04/18/2017 16:10    Assessment/Plan: 2 Days Post-Op   Active Problems:   Hip fracture [facility])   Discharge to SNF, she is cleared from an orthopedic standpoint, she will follow-up in 2  weeks. She may weight bear as tolerated and use ASA for DVT prophylaxis.   Lovell Sheehan , MD 04/20/2017, 7:56 AM

## 2017-04-20 NOTE — Discharge Instructions (Signed)
[place] [facility] [place]:  Cardiac diet  DISCHARGE CONDITION:  Stable  ACTIVITY:  Activity as tolerated  OXYGEN:  Home Oxygen: No.   Oxygen Delivery: room air  DISCHARGE LOCATION:  nursing home    ADDITIONAL DISCHARGE INSTRUCTION:   If you experience worsening of your admission symptoms, develop shortness of breath, life threatening emergency, suicidal or homicidal thoughts you must seek medical attention immediately by calling 911 or calling your MD immediately  if symptoms less severe.  You Must read complete instructions/literature along with all the possible adverse reactions/side effects for all the Medicines you take and that have been prescribed to you. Take any new Medicines after you have completely understood and accpet all the possible adverse reactions/side effects.   Please note  You were cared for by a hospitalist during your hospital stay. If you have any questions about your discharge medications or the care you received while you were in the hospital after you are discharged, you can call the unit and asked to speak with the hospitalist on call if the hospitalist that took care of you is not available. Once you are discharged, your primary care physician will handle any further medical issues. Please note that NO REFILLS for any discharge medications will be authorized once you are discharged, as it is imperative that you return to your primary care physician (or establish a relationship with a primary care physician if you do not have one) for your aftercare needs so that they can reassess your need for medications and monitor your lab values.

## 2017-04-20 NOTE — Progress Notes (Signed)
Pt had small BM after suppository. EMS set up for first available transport. PIVs removed, bag of clothes and shoes will be sent with pt.   [place], Jerry Caras

## 2017-04-20 NOTE — Progress Notes (Signed)
Patient pulled out rectal tube, will continue to monitor.

## 2017-04-20 NOTE — Discharge Summary (Addendum)
[facility] - [place] at [facility], 83 y.o., DOB 1925-07-09, MRN [id number]. Admission date: 04/17/2017 Discharge Date 04/20/2017 Primary MD Einar Pheasant, MD Admitting Physician Gladstone Lighter, MD  Admission Diagnosis  Closed fracture of left hip, initial encounter [facility]) [S72.002A] Fall, initial encounter [W19.XXXA]  Discharge Diagnosis   Active Problems: Acute left intertrochanteric hip fracture Acute on chronic anemia Chronic low back pain Hypothyroidism Peptic ulcer disease Osteoarthritis Progressive dementia Coronary artery disease History of breast cancer    Hospital Course Angela Howe  is a 83 y.o. female with a known history of dementia, failure to thrive. His wheelchair bound at baseline, CAD, GERD, hypothyroidism who has had prior falls from a wheelchair presents to the hospital secondary to an unwitnessed fall today and noted to have left intertrochanteric fracture. Patient was admitted and seen in consultation by orthopedic surgery and underwent repair of the fracture.  Patient tolerated the procedure without any complications.  She does have a history of chronic anemia on presentation her hemoglobin was low therefore transfused posttransfusion blood count has been stable.  She is currently stable to be discharged Please note her heart rate and blood pressure slightly elevated so she is started on metoprolol  Patient to resume hospice services as doing before             Consults  orthopedic surgery  Significant Tests:  See full reports for all details     Dg Chest 1 View  Result Date: 04/17/2017 CLINICAL DATA:  Fall with left hip pain EXAM: CHEST 1 VIEW COMPARISON:  02/20/2017 FINDINGS: Post sternotomy changes. Small left pleural effusion. Partial consolidation in the left lower lobe. Stable cardiomediastinal silhouette with aortic atherosclerosis. No pneumothorax. Postsurgical changes at the right humeral head  parent IMPRESSION: 1. Small left pleural effusion with partial consolidation in the left lower lobe which may reflect atelectasis or an infiltrate 2. Borderline cardiomegaly. Electronically Signed   By: Donavan Foil M.D.   On: 04/17/2017 17:48   Ct Head Wo Contrast  Result Date: 04/17/2017 CLINICAL DATA:  Unwitnessed fall hematoma to back of head EXAM: CT HEAD WITHOUT CONTRAST CT CERVICAL SPINE WITHOUT CONTRAST TECHNIQUE: Multidetector CT imaging of the head and cervical spine was performed following the standard protocol without intravenous contrast. Multiplanar CT image reconstructions of the cervical spine were also generated. COMPARISON:  02/24/2017, 10/01/2007, CT chest 02/21/2017 FINDINGS: CT HEAD FINDINGS Brain: No acute territorial infarction, hemorrhage or intracranial mass is visualized. Old lacunar infarct in the left caudate. Moderate small vessel ischemic changes of the white matter. Moderate atrophy. Stable ventricle size. Vascular: No hyperdense vessels.  Carotid artery calcification Skull: No depressed skull fracture. Sinuses/Orbits: Old nasal bone deformity. Probable old deformity anterior wall left maxillary sinus. No acute orbital abnormality Other: Moderate left posterior parietal scalp hematoma. CT CERVICAL SPINE FINDINGS Alignment: No subluxation.  Facet alignment is within normal limits. Skull base and vertebrae: Craniovertebral junction is intact. Superior endplate deformity at T2 and T3, stable since October 2018 CT. Soft tissues and spinal canal: No prevertebral fluid or swelling. No visible canal hematoma. Disc levels:  Multilevel degenerative changes, moderate at C5-C6. Upper chest: Carotid artery calcification. Other: None IMPRESSION: 1. No CT evidence for acute intracranial abnormality. Atrophy and small vessel ischemic changes of the white matter. Moderate left posterior scalp hematoma 2. Degenerative changes of the cervical spine. No acute fracture is seen Electronically Signed    By: Donavan Foil M.D.   On: 04/17/2017 18:32  Ct Cervical Spine Wo Contrast  Result Date: 04/17/2017 CLINICAL DATA:  Unwitnessed fall hematoma to back of head EXAM: CT HEAD WITHOUT CONTRAST CT CERVICAL SPINE WITHOUT CONTRAST TECHNIQUE: Multidetector CT imaging of the head and cervical spine was performed following the standard protocol without intravenous contrast. Multiplanar CT image reconstructions of the cervical spine were also generated. COMPARISON:  02/24/2017, 10/01/2007, CT chest 02/21/2017 FINDINGS: CT HEAD FINDINGS Brain: No acute territorial infarction, hemorrhage or intracranial mass is visualized. Old lacunar infarct in the left caudate. Moderate small vessel ischemic changes of the white matter. Moderate atrophy. Stable ventricle size. Vascular: No hyperdense vessels.  Carotid artery calcification Skull: No depressed skull fracture. Sinuses/Orbits: Old nasal bone deformity. Probable old deformity anterior wall left maxillary sinus. No acute orbital abnormality Other: Moderate left posterior parietal scalp hematoma. CT CERVICAL SPINE FINDINGS Alignment: No subluxation.  Facet alignment is within normal limits. Skull base and vertebrae: Craniovertebral junction is intact. Superior endplate deformity at T2 and T3, stable since October 2018 CT. Soft tissues and spinal canal: No prevertebral fluid or swelling. No visible canal hematoma. Disc levels:  Multilevel degenerative changes, moderate at C5-C6. Upper chest: Carotid artery calcification. Other: None IMPRESSION: 1. No CT evidence for acute intracranial abnormality. Atrophy and small vessel ischemic changes of the white matter. Moderate left posterior scalp hematoma 2. Degenerative changes of the cervical spine. No acute fracture is seen Electronically Signed   By: Donavan Foil M.D.   On: 04/17/2017 18:32   Dg Hip Operative Unilat W Or W/o Pelvis Left  Result Date: 04/18/2017 CLINICAL DATA:  Left intertrochanteric fracture repair EXAM:  OPERATIVE LEFT HIP (WITH PELVIS IF PERFORMED) 3 VIEWS TECHNIQUE: Fluoroscopic spot image(s) were submitted for interpretation post-operatively. COMPARISON:  None FLUOROSCOPY TIME:  47 seconds FINDINGS: Left intertrochanteric fracture transfixed with an intramedullary nail and interlocking cannulated femoral neck screw without hardware failure or complication. Mild persistent displacement. IMPRESSION: Interval left intertrochanteric fracture ORIF. Electronically Signed   By: Kathreen Devoid   On: 04/18/2017 16:10   Dg Hip Unilat W Or Wo Pelvis 2-3 Views Left  Result Date: 04/17/2017 CLINICAL DATA:  Fall with hip pain EXAM: DG HIP (WITH OR WITHOUT PELVIS) 2-3V LEFT COMPARISON:  CT 02/21/2017 FINDINGS: Treated compression fractures of the lower lumbar spine partially visible. Bones appear osteopenic. Pubic symphysis and rami appear intact. Advanced arthritis of the right hip with mild remodeling of the femoral head, joint space narrowing, sclerosis. Acute, comminuted and impacted intertrochanteric fracture. Left femoral head appears located. Apex anterior angulation on cross-table lateral view. IMPRESSION: Acute, comminuted left intertrochanteric fracture. Electronically Signed   By: Donavan Foil M.D.   On: 04/17/2017 17:51       Today   Subjective:   Angela Howe confused this morning denies any complaints  Objective:   Blood pressure (!) 162/72, pulse (!) 105, temperature 99.8 F (37.7 C), temperature source Axillary, resp. rate 17, height 5\' 5"  (1.651 m), weight 105 lb (47.6 kg), SpO2 97 %.  .  Intake/Output Summary (Last 24 hours) at 04/20/2017 1226 Last data filed at 04/20/2017 1014 Gross per 24 hour  Intake 613.34 ml  Output 155 ml  Net 458.34 ml    Exam VITAL SIGNS: Blood pressure (!) 162/72, pulse (!) 105, temperature 99.8 F (37.7 C), temperature source Axillary, resp. rate 17, height 5\' 5"  (1.651 m), weight 105 lb (47.6 kg), SpO2 97 %.  GENERAL:  83 y.o.-year-old patient lying in  the bed with no acute distress.  EYES: Pupils equal,  round, reactive to light and accommodation. No scleral icterus. Extraocular muscles intact.  HEENT: Head atraumatic, normocephalic. Oropharynx and nasopharynx clear.  NECK:  Supple, no jugular venous distention. No thyroid enlargement, no tenderness.  LUNGS: Normal breath sounds bilaterally, no wheezing, rales,rhonchi or crepitation. No use of accessory muscles of respiration.  CARDIOVASCULAR: S1, S2 normal. No murmurs, rubs, or gallops.  ABDOMEN: Soft, nontender, nondistended. Bowel sounds present. No organomegaly or mass.  EXTREMITIES: No pedal edema, cyanosis, or clubbing.  NEUROLOGIC: Confused  pSYCHIATRIC: The patient confused SKIN: No obvious rash, lesion, or ulcer.   Data Review     CBC w Diff:  Lab Results  Component Value Date   WBC 8.3 04/20/2017   HGB 9.1 (L) 04/20/2017   HGB 11.3 (L) 10/23/2013   HCT 27.8 (L) 04/20/2017   HCT 33.4 (L) 10/23/2013   PLT 225 04/20/2017   PLT 222 10/23/2013   LYMPHOPCT 18 04/19/2017   LYMPHOPCT 29.4 10/23/2013   MONOPCT 13 04/19/2017   MONOPCT 9.2 10/23/2013   EOSPCT 0 04/19/2017   EOSPCT 4.0 10/23/2013   BASOPCT 0 04/19/2017   BASOPCT 0.4 10/23/2013   CMP:  Lab Results  Component Value Date   NA 142 04/20/2017   NA 142 10/23/2013   K 3.5 04/20/2017   K 4.0 10/23/2013   CL 116 (H) 04/20/2017   CL 110 (H) 10/23/2013   CO2 19 (L) 04/20/2017   CO2 25 10/23/2013   BUN 25 (H) 04/20/2017   BUN 20 (H) 10/23/2013   CREATININE 0.92 04/20/2017   CREATININE 1.00 10/23/2013   PROT 5.7 (L) 04/20/2017   PROT 6.4 10/22/2013   ALBUMIN 2.7 (L) 04/20/2017   ALBUMIN 3.2 (L) 10/22/2013   BILITOT 0.8 04/20/2017   BILITOT 0.3 10/22/2013   ALKPHOS 72 04/20/2017   ALKPHOS 129 (H) 10/22/2013   AST 51 (H) 04/20/2017   AST 62 (H) 10/22/2013   ALT 15 04/20/2017   ALT 71 10/22/2013  .  Micro Results Recent Results (from the past 240 hour(s))  Surgical pcr screen     Status: Abnormal    Collection Time: 04/17/17 10:12 PM  Result Value Ref Range Status   MRSA, PCR NEGATIVE NEGATIVE Final   Staphylococcus aureus POSITIVE (A) NEGATIVE Final    Comment: (NOTE) The Xpert SA Assay (FDA approved for NASAL specimens in patients 64 years of age and older), is one component of a comprehensive surveillance program. It is not intended to diagnose infection nor to guide or monitor treatment.         Code Status Orders  (From admission, onward)        Start     Ordered   04/17/17 2112  Do not attempt resuscitation (DNR)  Continuous    Question Answer Comment  In the event of cardiac or respiratory ARREST Do not call a "code blue"   In the event of cardiac or respiratory ARREST Do not perform Intubation, CPR, defibrillation or ACLS   In the event of cardiac or respiratory ARREST Use medication by any route, position, wound care, and other measures to relive pain and suffering. May use oxygen, suction and manual treatment of airway obstruction as needed for comfort.      04/17/17 2111    Code Status History    Date Active Date Inactive Code Status Order ID Comments User Context   02/20/2017 15:22 02/23/2017 17:44 DNR [id number]  Henreitta Leber, MD Inpatient   12/21/2016 16:14 12/24/2016 17:20 Full Code [id number]  Vianne Bulls,  Lise Auer, MD ED   12/21/2016 15:47 12/21/2016 16:14 DNR [id number]  Epifanio Lesches, MD ED   12/21/2016 15:46 12/21/2016 15:47 Full Code [id number]  Epifanio Lesches, MD ED   10/01/2015 17:34 10/02/2015 18:18 DNR [id number]  Loletha Grayer, MD ED    Advance Directive Documentation     Most Recent Value  Type of Advance Directive  Healthcare Power of Attorney  Pre-existing out of facility DNR order (yellow form or pink MOST form)  No data  "MOST" Form in Place?  No data           Contact information for follow-up providers    Lovell Sheehan, MD Follow up in 2 week(s).   Specialty:  Orthopedic Surgery Why:  post hip fracutre reapir f/u Contact  information: [place] [address] [phone]        Einar Pheasant, MD In 1 week.   Specialty:  Internal Medicine Why:  OFFICE TO CALL Contact information: [address] [place] [address] [phone]            Contact information for after-discharge care    [facility].   Service:  Skilled Nursing Why:  OFFICE TO CALL Contact information: [place] [phone]                  Discharge Medications   Allergies as of 04/20/2017      Reactions   Alendronate Sodium Other (See Comments)   Reaction:  Restlessness    Prednisone Other (See Comments)   Reaction:  Restlessness       Medication List    TAKE these medications   acetaminophen 325 MG tablet Commonly known as:  TYLENOL Take 2 tablets (650 mg total) by mouth every 6 (six) hours as needed for mild pain (or Fever >/= 101).   amitriptyline 25 MG tablet Commonly known as:  ELAVIL Take 1 tablet (25 mg total) by mouth at bedtime.   aspirin 81 MG chewable tablet Chew 1 tablet (81 mg total) by mouth daily for 28 days. Start taking on:  04/21/2017   atorvastatin 20 MG tablet Commonly known as:  LIPITOR Take 1 tablet (20 mg total) by mouth daily.   calcium-vitamin D 500-200 MG-UNIT tablet Commonly known as:  OSCAL WITH D Take 2 tablets by mouth 2 (two) times daily.   citalopram 20 MG tablet Commonly known as:  CELEXA Take 1 tablet (20 mg total) by mouth daily.   docusate 50 MG/5ML liquid Commonly known as:  COLACE Take 5 mLs (50 mg total) by mouth 2 (two) times daily. What changed:  how much to take   feeding supplement (ENSURE ENLIVE) Liqd Take 237 mLs by mouth 3 (three) times daily between meals.   fentaNYL 12 MCG/HR Commonly known as:  DURAGESIC - dosed mcg/hr Place 1 patch (12.5 mcg total) onto the skin every 3 (three) days.   fludrocortisone 0.1 MG  tablet Commonly known as:  FLORINEF Take 1 tablet (0.1 mg total) by mouth daily.   HYDROcodone-acetaminophen 5-325 MG tablet Commonly known as:  NORCO/VICODIN Take 1 tablet by mouth every 4 (four) hours as needed for moderate pain ((score 4 to 6)).   levothyroxine 50 MCG tablet Commonly known as:  SYNTHROID, LEVOTHROID Take 1 tablet (50 mcg total) by mouth daily.   lidocaine 5 % Commonly known as:  LIDODERM Place 2 patches onto the skin daily. Remove & Discard patch  within 12 hours or as directed by MD   megestrol 625 MG/5ML suspension Commonly known as:  MEGACE ES Take 5 mLs (625 mg total) by mouth daily.   metoprolol tartrate 25 MG tablet Commonly known as:  LOPRESSOR Take 1 tablet (25 mg total) by mouth 2 (two) times daily.   multivitamin with minerals Tabs tablet Take 1 tablet by mouth daily.   nitroGLYCERIN 0.4 MG SL tablet Commonly known as:  NITROSTAT Place 1 tablet (0.4 mg total) under the tongue every 5 (five) minutes as needed for chest pain.   omeprazole 40 MG capsule Commonly known as:  PRILOSEC Take 40 mg by mouth daily.   QUEtiapine 25 MG tablet Commonly known as:  SEROQUEL Take 12.5 mg by mouth at bedtime.          Total Time in preparing paper work, data evaluation and todays exam - 35 minutes  Dustin Flock M.D on 04/20/2017 at 12:26 PM  [facility]   Office  [phone]

## 2017-04-20 NOTE — Progress Notes (Signed)
Patient is medically stable for D/C back to [facility] long term care and continue hospice services with [facility]. Per [facility] admissions coordinator at [facility] patient can come today to room 203-A. RN will call report and arrange EMS for transport. Clinical Education officer, museum (CSW) sent D/C orders to [facility]. Kara with [facility] is aware of above. CSW left patient's daughter [name] a Advertising account executive. CSW also contacted patient's niece Benjamine Mola and made her aware of above. Please reconsult if future social work needs arise. CSW signing off.   [employer], LCSW [phone]

## 2017-04-20 NOTE — Progress Notes (Signed)
Follow up on current hospice patient.  Patient will discharge back to [facility] today with continued hospice services.  Discharge information faxed to triage.

## 2017-04-20 NOTE — Telephone Encounter (Signed)
Spoke w/ Janett Billow from [facility] and patient is being discharged from [facility] today and will need hospital follow-up visit. Please contact for TCM and schedule appt. [facility] and myself are unable to find any availability within 2 weeks.

## 2017-04-25 NOTE — Telephone Encounter (Signed)
Patient was D/C to [facility],will continue to follow on release for TCM and SNF follow up. FYI

## 2017-04-26 DIAGNOSIS — D638 Anemia in other chronic diseases classified elsewhere: Secondary | ICD-10-CM | POA: Diagnosis not present

## 2017-04-26 DIAGNOSIS — M8000XD Age-related osteoporosis with current pathological fracture, unspecified site, subsequent encounter for fracture with routine healing: Secondary | ICD-10-CM | POA: Diagnosis not present

## 2017-04-26 DIAGNOSIS — G9340 Encephalopathy, unspecified: Secondary | ICD-10-CM | POA: Diagnosis not present

## 2017-06-05 DIAGNOSIS — Z0000 Encounter for general adult medical examination without abnormal findings: Secondary | ICD-10-CM | POA: Diagnosis not present

## 2017-06-05 DIAGNOSIS — E441 Mild protein-calorie malnutrition: Secondary | ICD-10-CM | POA: Diagnosis not present

## 2017-06-05 DIAGNOSIS — M8000XD Age-related osteoporosis with current pathological fracture, unspecified site, subsequent encounter for fracture with routine healing: Secondary | ICD-10-CM | POA: Diagnosis not present

## 2017-06-05 DIAGNOSIS — S72001D Fracture of unspecified part of neck of right femur, subsequent encounter for closed fracture with routine healing: Secondary | ICD-10-CM | POA: Diagnosis not present

## 2017-06-05 DIAGNOSIS — M25552 Pain in left hip: Secondary | ICD-10-CM | POA: Diagnosis not present

## 2017-06-07 ENCOUNTER — Inpatient Hospital Stay: Admitting: Internal Medicine

## 2017-06-20 DIAGNOSIS — R627 Adult failure to thrive: Secondary | ICD-10-CM | POA: Diagnosis not present

## 2017-06-20 DIAGNOSIS — Z853 Personal history of malignant neoplasm of breast: Secondary | ICD-10-CM | POA: Diagnosis not present

## 2017-06-20 DIAGNOSIS — E039 Hypothyroidism, unspecified: Secondary | ICD-10-CM | POA: Diagnosis not present

## 2017-06-20 DIAGNOSIS — K219 Gastro-esophageal reflux disease without esophagitis: Secondary | ICD-10-CM | POA: Diagnosis not present

## 2017-06-20 DIAGNOSIS — M1991 Primary osteoarthritis, unspecified site: Secondary | ICD-10-CM | POA: Diagnosis not present

## 2017-06-20 DIAGNOSIS — Z951 Presence of aortocoronary bypass graft: Secondary | ICD-10-CM | POA: Diagnosis not present

## 2017-06-20 DIAGNOSIS — D649 Anemia, unspecified: Secondary | ICD-10-CM | POA: Diagnosis not present

## 2017-06-20 DIAGNOSIS — E785 Hyperlipidemia, unspecified: Secondary | ICD-10-CM | POA: Diagnosis not present

## 2017-06-20 DIAGNOSIS — S72002D Fracture of unspecified part of neck of left femur, subsequent encounter for closed fracture with routine healing: Secondary | ICD-10-CM | POA: Diagnosis not present

## 2017-06-20 DIAGNOSIS — I2510 Atherosclerotic heart disease of native coronary artery without angina pectoris: Secondary | ICD-10-CM | POA: Diagnosis not present

## 2017-06-20 DIAGNOSIS — M62462 Contracture of muscle, left lower leg: Secondary | ICD-10-CM | POA: Diagnosis not present

## 2017-06-20 DIAGNOSIS — K279 Peptic ulcer, site unspecified, unspecified as acute or chronic, without hemorrhage or perforation: Secondary | ICD-10-CM | POA: Diagnosis not present

## 2017-06-21 DIAGNOSIS — D649 Anemia, unspecified: Secondary | ICD-10-CM | POA: Diagnosis not present

## 2017-06-21 DIAGNOSIS — M62462 Contracture of muscle, left lower leg: Secondary | ICD-10-CM | POA: Diagnosis not present

## 2017-06-21 DIAGNOSIS — R627 Adult failure to thrive: Secondary | ICD-10-CM | POA: Diagnosis not present

## 2017-06-21 DIAGNOSIS — I2510 Atherosclerotic heart disease of native coronary artery without angina pectoris: Secondary | ICD-10-CM | POA: Diagnosis not present

## 2017-06-21 DIAGNOSIS — E039 Hypothyroidism, unspecified: Secondary | ICD-10-CM | POA: Diagnosis not present

## 2017-06-21 DIAGNOSIS — K279 Peptic ulcer, site unspecified, unspecified as acute or chronic, without hemorrhage or perforation: Secondary | ICD-10-CM | POA: Diagnosis not present

## 2017-06-21 DIAGNOSIS — Z853 Personal history of malignant neoplasm of breast: Secondary | ICD-10-CM | POA: Diagnosis not present

## 2017-06-21 DIAGNOSIS — Z951 Presence of aortocoronary bypass graft: Secondary | ICD-10-CM | POA: Diagnosis not present

## 2017-06-21 DIAGNOSIS — S72002D Fracture of unspecified part of neck of left femur, subsequent encounter for closed fracture with routine healing: Secondary | ICD-10-CM | POA: Diagnosis not present

## 2017-06-21 DIAGNOSIS — K219 Gastro-esophageal reflux disease without esophagitis: Secondary | ICD-10-CM | POA: Diagnosis not present

## 2017-06-21 DIAGNOSIS — E785 Hyperlipidemia, unspecified: Secondary | ICD-10-CM | POA: Diagnosis not present

## 2017-06-21 DIAGNOSIS — M1991 Primary osteoarthritis, unspecified site: Secondary | ICD-10-CM | POA: Diagnosis not present

## 2017-06-22 DIAGNOSIS — E039 Hypothyroidism, unspecified: Secondary | ICD-10-CM | POA: Diagnosis not present

## 2017-06-22 DIAGNOSIS — I2510 Atherosclerotic heart disease of native coronary artery without angina pectoris: Secondary | ICD-10-CM | POA: Diagnosis not present

## 2017-06-22 DIAGNOSIS — M1991 Primary osteoarthritis, unspecified site: Secondary | ICD-10-CM | POA: Diagnosis not present

## 2017-06-22 DIAGNOSIS — R627 Adult failure to thrive: Secondary | ICD-10-CM | POA: Diagnosis not present

## 2017-06-22 DIAGNOSIS — M62462 Contracture of muscle, left lower leg: Secondary | ICD-10-CM | POA: Diagnosis not present

## 2017-06-22 DIAGNOSIS — K219 Gastro-esophageal reflux disease without esophagitis: Secondary | ICD-10-CM | POA: Diagnosis not present

## 2017-06-22 DIAGNOSIS — Z853 Personal history of malignant neoplasm of breast: Secondary | ICD-10-CM | POA: Diagnosis not present

## 2017-06-22 DIAGNOSIS — D649 Anemia, unspecified: Secondary | ICD-10-CM | POA: Diagnosis not present

## 2017-06-22 DIAGNOSIS — E785 Hyperlipidemia, unspecified: Secondary | ICD-10-CM | POA: Diagnosis not present

## 2017-06-22 DIAGNOSIS — S72002D Fracture of unspecified part of neck of left femur, subsequent encounter for closed fracture with routine healing: Secondary | ICD-10-CM | POA: Diagnosis not present

## 2017-06-22 DIAGNOSIS — Z951 Presence of aortocoronary bypass graft: Secondary | ICD-10-CM | POA: Diagnosis not present

## 2017-06-22 DIAGNOSIS — K279 Peptic ulcer, site unspecified, unspecified as acute or chronic, without hemorrhage or perforation: Secondary | ICD-10-CM | POA: Diagnosis not present

## 2017-06-23 DIAGNOSIS — I2510 Atherosclerotic heart disease of native coronary artery without angina pectoris: Secondary | ICD-10-CM | POA: Diagnosis not present

## 2017-06-23 DIAGNOSIS — R627 Adult failure to thrive: Secondary | ICD-10-CM | POA: Diagnosis not present

## 2017-06-23 DIAGNOSIS — K219 Gastro-esophageal reflux disease without esophagitis: Secondary | ICD-10-CM | POA: Diagnosis not present

## 2017-06-23 DIAGNOSIS — Z853 Personal history of malignant neoplasm of breast: Secondary | ICD-10-CM | POA: Diagnosis not present

## 2017-06-23 DIAGNOSIS — Z951 Presence of aortocoronary bypass graft: Secondary | ICD-10-CM | POA: Diagnosis not present

## 2017-06-23 DIAGNOSIS — E039 Hypothyroidism, unspecified: Secondary | ICD-10-CM | POA: Diagnosis not present

## 2017-06-23 DIAGNOSIS — E785 Hyperlipidemia, unspecified: Secondary | ICD-10-CM | POA: Diagnosis not present

## 2017-06-23 DIAGNOSIS — M1991 Primary osteoarthritis, unspecified site: Secondary | ICD-10-CM | POA: Diagnosis not present

## 2017-06-23 DIAGNOSIS — M62462 Contracture of muscle, left lower leg: Secondary | ICD-10-CM | POA: Diagnosis not present

## 2017-06-23 DIAGNOSIS — D649 Anemia, unspecified: Secondary | ICD-10-CM | POA: Diagnosis not present

## 2017-06-23 DIAGNOSIS — S72002D Fracture of unspecified part of neck of left femur, subsequent encounter for closed fracture with routine healing: Secondary | ICD-10-CM | POA: Diagnosis not present

## 2017-06-23 DIAGNOSIS — K279 Peptic ulcer, site unspecified, unspecified as acute or chronic, without hemorrhage or perforation: Secondary | ICD-10-CM | POA: Diagnosis not present

## 2017-06-24 DIAGNOSIS — D649 Anemia, unspecified: Secondary | ICD-10-CM | POA: Diagnosis not present

## 2017-06-24 DIAGNOSIS — Z853 Personal history of malignant neoplasm of breast: Secondary | ICD-10-CM | POA: Diagnosis not present

## 2017-06-24 DIAGNOSIS — K219 Gastro-esophageal reflux disease without esophagitis: Secondary | ICD-10-CM | POA: Diagnosis not present

## 2017-06-24 DIAGNOSIS — K279 Peptic ulcer, site unspecified, unspecified as acute or chronic, without hemorrhage or perforation: Secondary | ICD-10-CM | POA: Diagnosis not present

## 2017-06-24 DIAGNOSIS — R627 Adult failure to thrive: Secondary | ICD-10-CM | POA: Diagnosis not present

## 2017-06-24 DIAGNOSIS — M1991 Primary osteoarthritis, unspecified site: Secondary | ICD-10-CM | POA: Diagnosis not present

## 2017-06-24 DIAGNOSIS — E039 Hypothyroidism, unspecified: Secondary | ICD-10-CM | POA: Diagnosis not present

## 2017-06-24 DIAGNOSIS — M62462 Contracture of muscle, left lower leg: Secondary | ICD-10-CM | POA: Diagnosis not present

## 2017-06-24 DIAGNOSIS — I2510 Atherosclerotic heart disease of native coronary artery without angina pectoris: Secondary | ICD-10-CM | POA: Diagnosis not present

## 2017-06-24 DIAGNOSIS — E785 Hyperlipidemia, unspecified: Secondary | ICD-10-CM | POA: Diagnosis not present

## 2017-06-24 DIAGNOSIS — S72002D Fracture of unspecified part of neck of left femur, subsequent encounter for closed fracture with routine healing: Secondary | ICD-10-CM | POA: Diagnosis not present

## 2017-06-24 DIAGNOSIS — Z951 Presence of aortocoronary bypass graft: Secondary | ICD-10-CM | POA: Diagnosis not present

## 2017-06-25 DIAGNOSIS — S72002D Fracture of unspecified part of neck of left femur, subsequent encounter for closed fracture with routine healing: Secondary | ICD-10-CM | POA: Diagnosis not present

## 2017-06-25 DIAGNOSIS — E039 Hypothyroidism, unspecified: Secondary | ICD-10-CM | POA: Diagnosis not present

## 2017-06-25 DIAGNOSIS — Z951 Presence of aortocoronary bypass graft: Secondary | ICD-10-CM | POA: Diagnosis not present

## 2017-06-25 DIAGNOSIS — I2510 Atherosclerotic heart disease of native coronary artery without angina pectoris: Secondary | ICD-10-CM | POA: Diagnosis not present

## 2017-06-25 DIAGNOSIS — Z853 Personal history of malignant neoplasm of breast: Secondary | ICD-10-CM | POA: Diagnosis not present

## 2017-06-25 DIAGNOSIS — M1991 Primary osteoarthritis, unspecified site: Secondary | ICD-10-CM | POA: Diagnosis not present

## 2017-06-25 DIAGNOSIS — D649 Anemia, unspecified: Secondary | ICD-10-CM | POA: Diagnosis not present

## 2017-06-25 DIAGNOSIS — R627 Adult failure to thrive: Secondary | ICD-10-CM | POA: Diagnosis not present

## 2017-06-25 DIAGNOSIS — K219 Gastro-esophageal reflux disease without esophagitis: Secondary | ICD-10-CM | POA: Diagnosis not present

## 2017-06-25 DIAGNOSIS — M62462 Contracture of muscle, left lower leg: Secondary | ICD-10-CM | POA: Diagnosis not present

## 2017-06-25 DIAGNOSIS — E785 Hyperlipidemia, unspecified: Secondary | ICD-10-CM | POA: Diagnosis not present

## 2017-06-25 DIAGNOSIS — K279 Peptic ulcer, site unspecified, unspecified as acute or chronic, without hemorrhage or perforation: Secondary | ICD-10-CM | POA: Diagnosis not present

## 2017-06-26 DIAGNOSIS — K219 Gastro-esophageal reflux disease without esophagitis: Secondary | ICD-10-CM | POA: Diagnosis not present

## 2017-06-26 DIAGNOSIS — E039 Hypothyroidism, unspecified: Secondary | ICD-10-CM | POA: Diagnosis not present

## 2017-06-26 DIAGNOSIS — D649 Anemia, unspecified: Secondary | ICD-10-CM | POA: Diagnosis not present

## 2017-06-26 DIAGNOSIS — S72002D Fracture of unspecified part of neck of left femur, subsequent encounter for closed fracture with routine healing: Secondary | ICD-10-CM | POA: Diagnosis not present

## 2017-06-26 DIAGNOSIS — Z951 Presence of aortocoronary bypass graft: Secondary | ICD-10-CM | POA: Diagnosis not present

## 2017-06-26 DIAGNOSIS — K279 Peptic ulcer, site unspecified, unspecified as acute or chronic, without hemorrhage or perforation: Secondary | ICD-10-CM | POA: Diagnosis not present

## 2017-06-26 DIAGNOSIS — M62462 Contracture of muscle, left lower leg: Secondary | ICD-10-CM | POA: Diagnosis not present

## 2017-06-26 DIAGNOSIS — Z853 Personal history of malignant neoplasm of breast: Secondary | ICD-10-CM | POA: Diagnosis not present

## 2017-06-26 DIAGNOSIS — M1991 Primary osteoarthritis, unspecified site: Secondary | ICD-10-CM | POA: Diagnosis not present

## 2017-06-26 DIAGNOSIS — I2510 Atherosclerotic heart disease of native coronary artery without angina pectoris: Secondary | ICD-10-CM | POA: Diagnosis not present

## 2017-06-26 DIAGNOSIS — E785 Hyperlipidemia, unspecified: Secondary | ICD-10-CM | POA: Diagnosis not present

## 2017-06-26 DIAGNOSIS — R627 Adult failure to thrive: Secondary | ICD-10-CM | POA: Diagnosis not present

## 2017-06-27 DIAGNOSIS — I2510 Atherosclerotic heart disease of native coronary artery without angina pectoris: Secondary | ICD-10-CM | POA: Diagnosis not present

## 2017-06-27 DIAGNOSIS — Z853 Personal history of malignant neoplasm of breast: Secondary | ICD-10-CM | POA: Diagnosis not present

## 2017-06-27 DIAGNOSIS — Z951 Presence of aortocoronary bypass graft: Secondary | ICD-10-CM | POA: Diagnosis not present

## 2017-06-27 DIAGNOSIS — M1991 Primary osteoarthritis, unspecified site: Secondary | ICD-10-CM | POA: Diagnosis not present

## 2017-06-27 DIAGNOSIS — E785 Hyperlipidemia, unspecified: Secondary | ICD-10-CM | POA: Diagnosis not present

## 2017-06-27 DIAGNOSIS — K279 Peptic ulcer, site unspecified, unspecified as acute or chronic, without hemorrhage or perforation: Secondary | ICD-10-CM | POA: Diagnosis not present

## 2017-06-27 DIAGNOSIS — D649 Anemia, unspecified: Secondary | ICD-10-CM | POA: Diagnosis not present

## 2017-06-27 DIAGNOSIS — E039 Hypothyroidism, unspecified: Secondary | ICD-10-CM | POA: Diagnosis not present

## 2017-06-27 DIAGNOSIS — S72002D Fracture of unspecified part of neck of left femur, subsequent encounter for closed fracture with routine healing: Secondary | ICD-10-CM | POA: Diagnosis not present

## 2017-06-27 DIAGNOSIS — M62462 Contracture of muscle, left lower leg: Secondary | ICD-10-CM | POA: Diagnosis not present

## 2017-06-27 DIAGNOSIS — R627 Adult failure to thrive: Secondary | ICD-10-CM | POA: Diagnosis not present

## 2017-06-27 DIAGNOSIS — K219 Gastro-esophageal reflux disease without esophagitis: Secondary | ICD-10-CM | POA: Diagnosis not present

## 2017-06-28 DIAGNOSIS — M62462 Contracture of muscle, left lower leg: Secondary | ICD-10-CM | POA: Diagnosis not present

## 2017-06-28 DIAGNOSIS — K219 Gastro-esophageal reflux disease without esophagitis: Secondary | ICD-10-CM | POA: Diagnosis not present

## 2017-06-28 DIAGNOSIS — Z951 Presence of aortocoronary bypass graft: Secondary | ICD-10-CM | POA: Diagnosis not present

## 2017-06-28 DIAGNOSIS — S72002D Fracture of unspecified part of neck of left femur, subsequent encounter for closed fracture with routine healing: Secondary | ICD-10-CM | POA: Diagnosis not present

## 2017-06-28 DIAGNOSIS — K279 Peptic ulcer, site unspecified, unspecified as acute or chronic, without hemorrhage or perforation: Secondary | ICD-10-CM | POA: Diagnosis not present

## 2017-06-28 DIAGNOSIS — D649 Anemia, unspecified: Secondary | ICD-10-CM | POA: Diagnosis not present

## 2017-06-28 DIAGNOSIS — M1991 Primary osteoarthritis, unspecified site: Secondary | ICD-10-CM | POA: Diagnosis not present

## 2017-06-28 DIAGNOSIS — R627 Adult failure to thrive: Secondary | ICD-10-CM | POA: Diagnosis not present

## 2017-06-28 DIAGNOSIS — E785 Hyperlipidemia, unspecified: Secondary | ICD-10-CM | POA: Diagnosis not present

## 2017-06-28 DIAGNOSIS — E039 Hypothyroidism, unspecified: Secondary | ICD-10-CM | POA: Diagnosis not present

## 2017-06-28 DIAGNOSIS — Z853 Personal history of malignant neoplasm of breast: Secondary | ICD-10-CM | POA: Diagnosis not present

## 2017-06-28 DIAGNOSIS — I2510 Atherosclerotic heart disease of native coronary artery without angina pectoris: Secondary | ICD-10-CM | POA: Diagnosis not present

## 2017-07-30 DIAGNOSIS — R627 Adult failure to thrive: Secondary | ICD-10-CM | POA: Diagnosis not present

## 2017-07-30 DIAGNOSIS — I2510 Atherosclerotic heart disease of native coronary artery without angina pectoris: Secondary | ICD-10-CM | POA: Diagnosis not present

## 2017-07-30 DIAGNOSIS — E039 Hypothyroidism, unspecified: Secondary | ICD-10-CM | POA: Diagnosis not present

## 2017-07-30 DIAGNOSIS — Z951 Presence of aortocoronary bypass graft: Secondary | ICD-10-CM | POA: Diagnosis not present

## 2017-07-30 DIAGNOSIS — K279 Peptic ulcer, site unspecified, unspecified as acute or chronic, without hemorrhage or perforation: Secondary | ICD-10-CM | POA: Diagnosis not present

## 2017-07-30 DIAGNOSIS — K219 Gastro-esophageal reflux disease without esophagitis: Secondary | ICD-10-CM | POA: Diagnosis not present

## 2017-07-30 DIAGNOSIS — Z853 Personal history of malignant neoplasm of breast: Secondary | ICD-10-CM | POA: Diagnosis not present

## 2017-07-30 DIAGNOSIS — M1991 Primary osteoarthritis, unspecified site: Secondary | ICD-10-CM | POA: Diagnosis not present

## 2017-07-30 DIAGNOSIS — S72002D Fracture of unspecified part of neck of left femur, subsequent encounter for closed fracture with routine healing: Secondary | ICD-10-CM | POA: Diagnosis not present

## 2017-07-30 DIAGNOSIS — D649 Anemia, unspecified: Secondary | ICD-10-CM | POA: Diagnosis not present

## 2017-07-30 DIAGNOSIS — M62462 Contracture of muscle, left lower leg: Secondary | ICD-10-CM | POA: Diagnosis not present

## 2017-07-30 DIAGNOSIS — E785 Hyperlipidemia, unspecified: Secondary | ICD-10-CM | POA: Diagnosis not present

## 2017-07-31 DIAGNOSIS — S72002D Fracture of unspecified part of neck of left femur, subsequent encounter for closed fracture with routine healing: Secondary | ICD-10-CM | POA: Diagnosis not present

## 2017-07-31 DIAGNOSIS — D649 Anemia, unspecified: Secondary | ICD-10-CM | POA: Diagnosis not present

## 2017-07-31 DIAGNOSIS — Z951 Presence of aortocoronary bypass graft: Secondary | ICD-10-CM | POA: Diagnosis not present

## 2017-07-31 DIAGNOSIS — E785 Hyperlipidemia, unspecified: Secondary | ICD-10-CM | POA: Diagnosis not present

## 2017-07-31 DIAGNOSIS — R627 Adult failure to thrive: Secondary | ICD-10-CM | POA: Diagnosis not present

## 2017-07-31 DIAGNOSIS — E039 Hypothyroidism, unspecified: Secondary | ICD-10-CM | POA: Diagnosis not present

## 2017-07-31 DIAGNOSIS — I2510 Atherosclerotic heart disease of native coronary artery without angina pectoris: Secondary | ICD-10-CM | POA: Diagnosis not present

## 2017-07-31 DIAGNOSIS — K279 Peptic ulcer, site unspecified, unspecified as acute or chronic, without hemorrhage or perforation: Secondary | ICD-10-CM | POA: Diagnosis not present

## 2017-07-31 DIAGNOSIS — M1991 Primary osteoarthritis, unspecified site: Secondary | ICD-10-CM | POA: Diagnosis not present

## 2017-07-31 DIAGNOSIS — K219 Gastro-esophageal reflux disease without esophagitis: Secondary | ICD-10-CM | POA: Diagnosis not present

## 2017-07-31 DIAGNOSIS — M62462 Contracture of muscle, left lower leg: Secondary | ICD-10-CM | POA: Diagnosis not present

## 2017-07-31 DIAGNOSIS — Z853 Personal history of malignant neoplasm of breast: Secondary | ICD-10-CM | POA: Diagnosis not present

## 2017-08-01 DIAGNOSIS — Z853 Personal history of malignant neoplasm of breast: Secondary | ICD-10-CM | POA: Diagnosis not present

## 2017-08-01 DIAGNOSIS — Z951 Presence of aortocoronary bypass graft: Secondary | ICD-10-CM | POA: Diagnosis not present

## 2017-08-01 DIAGNOSIS — I2510 Atherosclerotic heart disease of native coronary artery without angina pectoris: Secondary | ICD-10-CM | POA: Diagnosis not present

## 2017-08-01 DIAGNOSIS — K279 Peptic ulcer, site unspecified, unspecified as acute or chronic, without hemorrhage or perforation: Secondary | ICD-10-CM | POA: Diagnosis not present

## 2017-08-01 DIAGNOSIS — R627 Adult failure to thrive: Secondary | ICD-10-CM | POA: Diagnosis not present

## 2017-08-01 DIAGNOSIS — E039 Hypothyroidism, unspecified: Secondary | ICD-10-CM | POA: Diagnosis not present

## 2017-08-01 DIAGNOSIS — E785 Hyperlipidemia, unspecified: Secondary | ICD-10-CM | POA: Diagnosis not present

## 2017-08-01 DIAGNOSIS — M1991 Primary osteoarthritis, unspecified site: Secondary | ICD-10-CM | POA: Diagnosis not present

## 2017-08-01 DIAGNOSIS — D649 Anemia, unspecified: Secondary | ICD-10-CM | POA: Diagnosis not present

## 2017-08-01 DIAGNOSIS — M62462 Contracture of muscle, left lower leg: Secondary | ICD-10-CM | POA: Diagnosis not present

## 2017-08-01 DIAGNOSIS — K219 Gastro-esophageal reflux disease without esophagitis: Secondary | ICD-10-CM | POA: Diagnosis not present

## 2017-08-01 DIAGNOSIS — S72002D Fracture of unspecified part of neck of left femur, subsequent encounter for closed fracture with routine healing: Secondary | ICD-10-CM | POA: Diagnosis not present

## 2017-08-02 DIAGNOSIS — M1991 Primary osteoarthritis, unspecified site: Secondary | ICD-10-CM | POA: Diagnosis not present

## 2017-08-02 DIAGNOSIS — M62462 Contracture of muscle, left lower leg: Secondary | ICD-10-CM | POA: Diagnosis not present

## 2017-08-02 DIAGNOSIS — R627 Adult failure to thrive: Secondary | ICD-10-CM | POA: Diagnosis not present

## 2017-08-02 DIAGNOSIS — I2510 Atherosclerotic heart disease of native coronary artery without angina pectoris: Secondary | ICD-10-CM | POA: Diagnosis not present

## 2017-08-02 DIAGNOSIS — K279 Peptic ulcer, site unspecified, unspecified as acute or chronic, without hemorrhage or perforation: Secondary | ICD-10-CM | POA: Diagnosis not present

## 2017-08-02 DIAGNOSIS — E039 Hypothyroidism, unspecified: Secondary | ICD-10-CM | POA: Diagnosis not present

## 2017-08-02 DIAGNOSIS — K219 Gastro-esophageal reflux disease without esophagitis: Secondary | ICD-10-CM | POA: Diagnosis not present

## 2017-08-02 DIAGNOSIS — Z951 Presence of aortocoronary bypass graft: Secondary | ICD-10-CM | POA: Diagnosis not present

## 2017-08-02 DIAGNOSIS — D649 Anemia, unspecified: Secondary | ICD-10-CM | POA: Diagnosis not present

## 2017-08-02 DIAGNOSIS — Z853 Personal history of malignant neoplasm of breast: Secondary | ICD-10-CM | POA: Diagnosis not present

## 2017-08-02 DIAGNOSIS — S72002D Fracture of unspecified part of neck of left femur, subsequent encounter for closed fracture with routine healing: Secondary | ICD-10-CM | POA: Diagnosis not present

## 2017-08-02 DIAGNOSIS — E785 Hyperlipidemia, unspecified: Secondary | ICD-10-CM | POA: Diagnosis not present

## 2017-08-03 DIAGNOSIS — R627 Adult failure to thrive: Secondary | ICD-10-CM | POA: Diagnosis not present

## 2017-08-03 DIAGNOSIS — E039 Hypothyroidism, unspecified: Secondary | ICD-10-CM | POA: Diagnosis not present

## 2017-08-03 DIAGNOSIS — S72002D Fracture of unspecified part of neck of left femur, subsequent encounter for closed fracture with routine healing: Secondary | ICD-10-CM | POA: Diagnosis not present

## 2017-08-03 DIAGNOSIS — D649 Anemia, unspecified: Secondary | ICD-10-CM | POA: Diagnosis not present

## 2017-08-03 DIAGNOSIS — E785 Hyperlipidemia, unspecified: Secondary | ICD-10-CM | POA: Diagnosis not present

## 2017-08-03 DIAGNOSIS — K279 Peptic ulcer, site unspecified, unspecified as acute or chronic, without hemorrhage or perforation: Secondary | ICD-10-CM | POA: Diagnosis not present

## 2017-08-03 DIAGNOSIS — Z853 Personal history of malignant neoplasm of breast: Secondary | ICD-10-CM | POA: Diagnosis not present

## 2017-08-03 DIAGNOSIS — M1991 Primary osteoarthritis, unspecified site: Secondary | ICD-10-CM | POA: Diagnosis not present

## 2017-08-03 DIAGNOSIS — Z951 Presence of aortocoronary bypass graft: Secondary | ICD-10-CM | POA: Diagnosis not present

## 2017-08-03 DIAGNOSIS — I2510 Atherosclerotic heart disease of native coronary artery without angina pectoris: Secondary | ICD-10-CM | POA: Diagnosis not present

## 2017-08-03 DIAGNOSIS — K219 Gastro-esophageal reflux disease without esophagitis: Secondary | ICD-10-CM | POA: Diagnosis not present

## 2017-08-03 DIAGNOSIS — M62462 Contracture of muscle, left lower leg: Secondary | ICD-10-CM | POA: Diagnosis not present

## 2017-08-04 DIAGNOSIS — R627 Adult failure to thrive: Secondary | ICD-10-CM | POA: Diagnosis not present

## 2017-08-04 DIAGNOSIS — D649 Anemia, unspecified: Secondary | ICD-10-CM | POA: Diagnosis not present

## 2017-08-04 DIAGNOSIS — Z951 Presence of aortocoronary bypass graft: Secondary | ICD-10-CM | POA: Diagnosis not present

## 2017-08-04 DIAGNOSIS — K219 Gastro-esophageal reflux disease without esophagitis: Secondary | ICD-10-CM | POA: Diagnosis not present

## 2017-08-04 DIAGNOSIS — M1991 Primary osteoarthritis, unspecified site: Secondary | ICD-10-CM | POA: Diagnosis not present

## 2017-08-04 DIAGNOSIS — S72002D Fracture of unspecified part of neck of left femur, subsequent encounter for closed fracture with routine healing: Secondary | ICD-10-CM | POA: Diagnosis not present

## 2017-08-04 DIAGNOSIS — M62462 Contracture of muscle, left lower leg: Secondary | ICD-10-CM | POA: Diagnosis not present

## 2017-08-04 DIAGNOSIS — E039 Hypothyroidism, unspecified: Secondary | ICD-10-CM | POA: Diagnosis not present

## 2017-08-04 DIAGNOSIS — I2510 Atherosclerotic heart disease of native coronary artery without angina pectoris: Secondary | ICD-10-CM | POA: Diagnosis not present

## 2017-08-04 DIAGNOSIS — Z853 Personal history of malignant neoplasm of breast: Secondary | ICD-10-CM | POA: Diagnosis not present

## 2017-08-04 DIAGNOSIS — E785 Hyperlipidemia, unspecified: Secondary | ICD-10-CM | POA: Diagnosis not present

## 2017-08-04 DIAGNOSIS — K279 Peptic ulcer, site unspecified, unspecified as acute or chronic, without hemorrhage or perforation: Secondary | ICD-10-CM | POA: Diagnosis not present

## 2017-08-05 DIAGNOSIS — S72002D Fracture of unspecified part of neck of left femur, subsequent encounter for closed fracture with routine healing: Secondary | ICD-10-CM | POA: Diagnosis not present

## 2017-08-05 DIAGNOSIS — M62462 Contracture of muscle, left lower leg: Secondary | ICD-10-CM | POA: Diagnosis not present

## 2017-08-05 DIAGNOSIS — E785 Hyperlipidemia, unspecified: Secondary | ICD-10-CM | POA: Diagnosis not present

## 2017-08-05 DIAGNOSIS — Z853 Personal history of malignant neoplasm of breast: Secondary | ICD-10-CM | POA: Diagnosis not present

## 2017-08-05 DIAGNOSIS — D649 Anemia, unspecified: Secondary | ICD-10-CM | POA: Diagnosis not present

## 2017-08-05 DIAGNOSIS — Z951 Presence of aortocoronary bypass graft: Secondary | ICD-10-CM | POA: Diagnosis not present

## 2017-08-05 DIAGNOSIS — K219 Gastro-esophageal reflux disease without esophagitis: Secondary | ICD-10-CM | POA: Diagnosis not present

## 2017-08-05 DIAGNOSIS — I2510 Atherosclerotic heart disease of native coronary artery without angina pectoris: Secondary | ICD-10-CM | POA: Diagnosis not present

## 2017-08-05 DIAGNOSIS — M1991 Primary osteoarthritis, unspecified site: Secondary | ICD-10-CM | POA: Diagnosis not present

## 2017-08-05 DIAGNOSIS — K279 Peptic ulcer, site unspecified, unspecified as acute or chronic, without hemorrhage or perforation: Secondary | ICD-10-CM | POA: Diagnosis not present

## 2017-08-05 DIAGNOSIS — E039 Hypothyroidism, unspecified: Secondary | ICD-10-CM | POA: Diagnosis not present

## 2017-08-05 DIAGNOSIS — R627 Adult failure to thrive: Secondary | ICD-10-CM | POA: Diagnosis not present

## 2017-08-06 DIAGNOSIS — K279 Peptic ulcer, site unspecified, unspecified as acute or chronic, without hemorrhage or perforation: Secondary | ICD-10-CM | POA: Diagnosis not present

## 2017-08-06 DIAGNOSIS — I2510 Atherosclerotic heart disease of native coronary artery without angina pectoris: Secondary | ICD-10-CM | POA: Diagnosis not present

## 2017-08-06 DIAGNOSIS — E785 Hyperlipidemia, unspecified: Secondary | ICD-10-CM | POA: Diagnosis not present

## 2017-08-06 DIAGNOSIS — K219 Gastro-esophageal reflux disease without esophagitis: Secondary | ICD-10-CM | POA: Diagnosis not present

## 2017-08-06 DIAGNOSIS — S72002D Fracture of unspecified part of neck of left femur, subsequent encounter for closed fracture with routine healing: Secondary | ICD-10-CM | POA: Diagnosis not present

## 2017-08-06 DIAGNOSIS — R627 Adult failure to thrive: Secondary | ICD-10-CM | POA: Diagnosis not present

## 2017-08-06 DIAGNOSIS — E039 Hypothyroidism, unspecified: Secondary | ICD-10-CM | POA: Diagnosis not present

## 2017-08-06 DIAGNOSIS — D649 Anemia, unspecified: Secondary | ICD-10-CM | POA: Diagnosis not present

## 2017-08-06 DIAGNOSIS — Z853 Personal history of malignant neoplasm of breast: Secondary | ICD-10-CM | POA: Diagnosis not present

## 2017-08-06 DIAGNOSIS — M1991 Primary osteoarthritis, unspecified site: Secondary | ICD-10-CM | POA: Diagnosis not present

## 2017-08-06 DIAGNOSIS — Z951 Presence of aortocoronary bypass graft: Secondary | ICD-10-CM | POA: Diagnosis not present

## 2017-08-06 DIAGNOSIS — M62462 Contracture of muscle, left lower leg: Secondary | ICD-10-CM | POA: Diagnosis not present

## 2017-08-07 DIAGNOSIS — S72002D Fracture of unspecified part of neck of left femur, subsequent encounter for closed fracture with routine healing: Secondary | ICD-10-CM | POA: Diagnosis not present

## 2017-08-07 DIAGNOSIS — E039 Hypothyroidism, unspecified: Secondary | ICD-10-CM | POA: Diagnosis not present

## 2017-08-07 DIAGNOSIS — D649 Anemia, unspecified: Secondary | ICD-10-CM | POA: Diagnosis not present

## 2017-08-07 DIAGNOSIS — Z951 Presence of aortocoronary bypass graft: Secondary | ICD-10-CM | POA: Diagnosis not present

## 2017-08-07 DIAGNOSIS — K219 Gastro-esophageal reflux disease without esophagitis: Secondary | ICD-10-CM | POA: Diagnosis not present

## 2017-08-07 DIAGNOSIS — Z853 Personal history of malignant neoplasm of breast: Secondary | ICD-10-CM | POA: Diagnosis not present

## 2017-08-07 DIAGNOSIS — K279 Peptic ulcer, site unspecified, unspecified as acute or chronic, without hemorrhage or perforation: Secondary | ICD-10-CM | POA: Diagnosis not present

## 2017-08-07 DIAGNOSIS — M1991 Primary osteoarthritis, unspecified site: Secondary | ICD-10-CM | POA: Diagnosis not present

## 2017-08-07 DIAGNOSIS — M62462 Contracture of muscle, left lower leg: Secondary | ICD-10-CM | POA: Diagnosis not present

## 2017-08-07 DIAGNOSIS — E785 Hyperlipidemia, unspecified: Secondary | ICD-10-CM | POA: Diagnosis not present

## 2017-08-07 DIAGNOSIS — R627 Adult failure to thrive: Secondary | ICD-10-CM | POA: Diagnosis not present

## 2017-08-07 DIAGNOSIS — I2510 Atherosclerotic heart disease of native coronary artery without angina pectoris: Secondary | ICD-10-CM | POA: Diagnosis not present

## 2017-08-08 DIAGNOSIS — K219 Gastro-esophageal reflux disease without esophagitis: Secondary | ICD-10-CM | POA: Diagnosis not present

## 2017-08-08 DIAGNOSIS — Z853 Personal history of malignant neoplasm of breast: Secondary | ICD-10-CM | POA: Diagnosis not present

## 2017-08-08 DIAGNOSIS — K279 Peptic ulcer, site unspecified, unspecified as acute or chronic, without hemorrhage or perforation: Secondary | ICD-10-CM | POA: Diagnosis not present

## 2017-08-08 DIAGNOSIS — E785 Hyperlipidemia, unspecified: Secondary | ICD-10-CM | POA: Diagnosis not present

## 2017-08-08 DIAGNOSIS — Z951 Presence of aortocoronary bypass graft: Secondary | ICD-10-CM | POA: Diagnosis not present

## 2017-08-08 DIAGNOSIS — M62462 Contracture of muscle, left lower leg: Secondary | ICD-10-CM | POA: Diagnosis not present

## 2017-08-08 DIAGNOSIS — D649 Anemia, unspecified: Secondary | ICD-10-CM | POA: Diagnosis not present

## 2017-08-08 DIAGNOSIS — E039 Hypothyroidism, unspecified: Secondary | ICD-10-CM | POA: Diagnosis not present

## 2017-08-08 DIAGNOSIS — M1991 Primary osteoarthritis, unspecified site: Secondary | ICD-10-CM | POA: Diagnosis not present

## 2017-08-08 DIAGNOSIS — S72002D Fracture of unspecified part of neck of left femur, subsequent encounter for closed fracture with routine healing: Secondary | ICD-10-CM | POA: Diagnosis not present

## 2017-08-08 DIAGNOSIS — I2510 Atherosclerotic heart disease of native coronary artery without angina pectoris: Secondary | ICD-10-CM | POA: Diagnosis not present

## 2017-08-08 DIAGNOSIS — R627 Adult failure to thrive: Secondary | ICD-10-CM | POA: Diagnosis not present

## 2017-08-09 DIAGNOSIS — D649 Anemia, unspecified: Secondary | ICD-10-CM | POA: Diagnosis not present

## 2017-08-09 DIAGNOSIS — R627 Adult failure to thrive: Secondary | ICD-10-CM | POA: Diagnosis not present

## 2017-08-09 DIAGNOSIS — E785 Hyperlipidemia, unspecified: Secondary | ICD-10-CM | POA: Diagnosis not present

## 2017-08-09 DIAGNOSIS — Z951 Presence of aortocoronary bypass graft: Secondary | ICD-10-CM | POA: Diagnosis not present

## 2017-08-09 DIAGNOSIS — M1991 Primary osteoarthritis, unspecified site: Secondary | ICD-10-CM | POA: Diagnosis not present

## 2017-08-09 DIAGNOSIS — Z853 Personal history of malignant neoplasm of breast: Secondary | ICD-10-CM | POA: Diagnosis not present

## 2017-08-09 DIAGNOSIS — E039 Hypothyroidism, unspecified: Secondary | ICD-10-CM | POA: Diagnosis not present

## 2017-08-09 DIAGNOSIS — S72002D Fracture of unspecified part of neck of left femur, subsequent encounter for closed fracture with routine healing: Secondary | ICD-10-CM | POA: Diagnosis not present

## 2017-08-09 DIAGNOSIS — K279 Peptic ulcer, site unspecified, unspecified as acute or chronic, without hemorrhage or perforation: Secondary | ICD-10-CM | POA: Diagnosis not present

## 2017-08-09 DIAGNOSIS — I2510 Atherosclerotic heart disease of native coronary artery without angina pectoris: Secondary | ICD-10-CM | POA: Diagnosis not present

## 2017-08-09 DIAGNOSIS — K219 Gastro-esophageal reflux disease without esophagitis: Secondary | ICD-10-CM | POA: Diagnosis not present

## 2017-08-09 DIAGNOSIS — M62462 Contracture of muscle, left lower leg: Secondary | ICD-10-CM | POA: Diagnosis not present

## 2017-08-10 DIAGNOSIS — E785 Hyperlipidemia, unspecified: Secondary | ICD-10-CM | POA: Diagnosis not present

## 2017-08-10 DIAGNOSIS — M1991 Primary osteoarthritis, unspecified site: Secondary | ICD-10-CM | POA: Diagnosis not present

## 2017-08-10 DIAGNOSIS — K279 Peptic ulcer, site unspecified, unspecified as acute or chronic, without hemorrhage or perforation: Secondary | ICD-10-CM | POA: Diagnosis not present

## 2017-08-10 DIAGNOSIS — R627 Adult failure to thrive: Secondary | ICD-10-CM | POA: Diagnosis not present

## 2017-08-10 DIAGNOSIS — S72002D Fracture of unspecified part of neck of left femur, subsequent encounter for closed fracture with routine healing: Secondary | ICD-10-CM | POA: Diagnosis not present

## 2017-08-10 DIAGNOSIS — E039 Hypothyroidism, unspecified: Secondary | ICD-10-CM | POA: Diagnosis not present

## 2017-08-10 DIAGNOSIS — M62462 Contracture of muscle, left lower leg: Secondary | ICD-10-CM | POA: Diagnosis not present

## 2017-08-10 DIAGNOSIS — K219 Gastro-esophageal reflux disease without esophagitis: Secondary | ICD-10-CM | POA: Diagnosis not present

## 2017-08-10 DIAGNOSIS — I2510 Atherosclerotic heart disease of native coronary artery without angina pectoris: Secondary | ICD-10-CM | POA: Diagnosis not present

## 2017-08-10 DIAGNOSIS — Z951 Presence of aortocoronary bypass graft: Secondary | ICD-10-CM | POA: Diagnosis not present

## 2017-08-10 DIAGNOSIS — D649 Anemia, unspecified: Secondary | ICD-10-CM | POA: Diagnosis not present

## 2017-08-10 DIAGNOSIS — Z853 Personal history of malignant neoplasm of breast: Secondary | ICD-10-CM | POA: Diagnosis not present

## 2017-08-11 DIAGNOSIS — K219 Gastro-esophageal reflux disease without esophagitis: Secondary | ICD-10-CM | POA: Diagnosis not present

## 2017-08-11 DIAGNOSIS — E785 Hyperlipidemia, unspecified: Secondary | ICD-10-CM | POA: Diagnosis not present

## 2017-08-11 DIAGNOSIS — D649 Anemia, unspecified: Secondary | ICD-10-CM | POA: Diagnosis not present

## 2017-08-11 DIAGNOSIS — R627 Adult failure to thrive: Secondary | ICD-10-CM | POA: Diagnosis not present

## 2017-08-11 DIAGNOSIS — E039 Hypothyroidism, unspecified: Secondary | ICD-10-CM | POA: Diagnosis not present

## 2017-08-11 DIAGNOSIS — I2510 Atherosclerotic heart disease of native coronary artery without angina pectoris: Secondary | ICD-10-CM | POA: Diagnosis not present

## 2017-08-11 DIAGNOSIS — M62462 Contracture of muscle, left lower leg: Secondary | ICD-10-CM | POA: Diagnosis not present

## 2017-08-11 DIAGNOSIS — M1991 Primary osteoarthritis, unspecified site: Secondary | ICD-10-CM | POA: Diagnosis not present

## 2017-08-11 DIAGNOSIS — S72002D Fracture of unspecified part of neck of left femur, subsequent encounter for closed fracture with routine healing: Secondary | ICD-10-CM | POA: Diagnosis not present

## 2017-08-11 DIAGNOSIS — Z853 Personal history of malignant neoplasm of breast: Secondary | ICD-10-CM | POA: Diagnosis not present

## 2017-08-11 DIAGNOSIS — Z951 Presence of aortocoronary bypass graft: Secondary | ICD-10-CM | POA: Diagnosis not present

## 2017-08-11 DIAGNOSIS — K279 Peptic ulcer, site unspecified, unspecified as acute or chronic, without hemorrhage or perforation: Secondary | ICD-10-CM | POA: Diagnosis not present

## 2017-08-12 DIAGNOSIS — E785 Hyperlipidemia, unspecified: Secondary | ICD-10-CM | POA: Diagnosis not present

## 2017-08-12 DIAGNOSIS — D649 Anemia, unspecified: Secondary | ICD-10-CM | POA: Diagnosis not present

## 2017-08-12 DIAGNOSIS — Z853 Personal history of malignant neoplasm of breast: Secondary | ICD-10-CM | POA: Diagnosis not present

## 2017-08-12 DIAGNOSIS — M62462 Contracture of muscle, left lower leg: Secondary | ICD-10-CM | POA: Diagnosis not present

## 2017-08-12 DIAGNOSIS — Z951 Presence of aortocoronary bypass graft: Secondary | ICD-10-CM | POA: Diagnosis not present

## 2017-08-12 DIAGNOSIS — M1991 Primary osteoarthritis, unspecified site: Secondary | ICD-10-CM | POA: Diagnosis not present

## 2017-08-12 DIAGNOSIS — I2510 Atherosclerotic heart disease of native coronary artery without angina pectoris: Secondary | ICD-10-CM | POA: Diagnosis not present

## 2017-08-12 DIAGNOSIS — R627 Adult failure to thrive: Secondary | ICD-10-CM | POA: Diagnosis not present

## 2017-08-12 DIAGNOSIS — K279 Peptic ulcer, site unspecified, unspecified as acute or chronic, without hemorrhage or perforation: Secondary | ICD-10-CM | POA: Diagnosis not present

## 2017-08-12 DIAGNOSIS — S72002D Fracture of unspecified part of neck of left femur, subsequent encounter for closed fracture with routine healing: Secondary | ICD-10-CM | POA: Diagnosis not present

## 2017-08-12 DIAGNOSIS — K219 Gastro-esophageal reflux disease without esophagitis: Secondary | ICD-10-CM | POA: Diagnosis not present

## 2017-08-12 DIAGNOSIS — E039 Hypothyroidism, unspecified: Secondary | ICD-10-CM | POA: Diagnosis not present

## 2017-08-13 DIAGNOSIS — E785 Hyperlipidemia, unspecified: Secondary | ICD-10-CM | POA: Diagnosis not present

## 2017-08-13 DIAGNOSIS — M62462 Contracture of muscle, left lower leg: Secondary | ICD-10-CM | POA: Diagnosis not present

## 2017-08-13 DIAGNOSIS — Z853 Personal history of malignant neoplasm of breast: Secondary | ICD-10-CM | POA: Diagnosis not present

## 2017-08-13 DIAGNOSIS — R627 Adult failure to thrive: Secondary | ICD-10-CM | POA: Diagnosis not present

## 2017-08-13 DIAGNOSIS — Z951 Presence of aortocoronary bypass graft: Secondary | ICD-10-CM | POA: Diagnosis not present

## 2017-08-13 DIAGNOSIS — I2510 Atherosclerotic heart disease of native coronary artery without angina pectoris: Secondary | ICD-10-CM | POA: Diagnosis not present

## 2017-08-13 DIAGNOSIS — K219 Gastro-esophageal reflux disease without esophagitis: Secondary | ICD-10-CM | POA: Diagnosis not present

## 2017-08-13 DIAGNOSIS — D649 Anemia, unspecified: Secondary | ICD-10-CM | POA: Diagnosis not present

## 2017-08-13 DIAGNOSIS — M1991 Primary osteoarthritis, unspecified site: Secondary | ICD-10-CM | POA: Diagnosis not present

## 2017-08-13 DIAGNOSIS — E039 Hypothyroidism, unspecified: Secondary | ICD-10-CM | POA: Diagnosis not present

## 2017-08-13 DIAGNOSIS — K279 Peptic ulcer, site unspecified, unspecified as acute or chronic, without hemorrhage or perforation: Secondary | ICD-10-CM | POA: Diagnosis not present

## 2017-08-13 DIAGNOSIS — S72002D Fracture of unspecified part of neck of left femur, subsequent encounter for closed fracture with routine healing: Secondary | ICD-10-CM | POA: Diagnosis not present

## 2017-08-14 DIAGNOSIS — S72002D Fracture of unspecified part of neck of left femur, subsequent encounter for closed fracture with routine healing: Secondary | ICD-10-CM | POA: Diagnosis not present

## 2017-08-14 DIAGNOSIS — E785 Hyperlipidemia, unspecified: Secondary | ICD-10-CM | POA: Diagnosis not present

## 2017-08-14 DIAGNOSIS — E039 Hypothyroidism, unspecified: Secondary | ICD-10-CM | POA: Diagnosis not present

## 2017-08-14 DIAGNOSIS — M62462 Contracture of muscle, left lower leg: Secondary | ICD-10-CM | POA: Diagnosis not present

## 2017-08-14 DIAGNOSIS — R627 Adult failure to thrive: Secondary | ICD-10-CM | POA: Diagnosis not present

## 2017-08-14 DIAGNOSIS — D649 Anemia, unspecified: Secondary | ICD-10-CM | POA: Diagnosis not present

## 2017-08-14 DIAGNOSIS — K279 Peptic ulcer, site unspecified, unspecified as acute or chronic, without hemorrhage or perforation: Secondary | ICD-10-CM | POA: Diagnosis not present

## 2017-08-14 DIAGNOSIS — Z951 Presence of aortocoronary bypass graft: Secondary | ICD-10-CM | POA: Diagnosis not present

## 2017-08-14 DIAGNOSIS — I2510 Atherosclerotic heart disease of native coronary artery without angina pectoris: Secondary | ICD-10-CM | POA: Diagnosis not present

## 2017-08-14 DIAGNOSIS — Z853 Personal history of malignant neoplasm of breast: Secondary | ICD-10-CM | POA: Diagnosis not present

## 2017-08-14 DIAGNOSIS — K219 Gastro-esophageal reflux disease without esophagitis: Secondary | ICD-10-CM | POA: Diagnosis not present

## 2017-08-14 DIAGNOSIS — M1991 Primary osteoarthritis, unspecified site: Secondary | ICD-10-CM | POA: Diagnosis not present

## 2017-08-15 DIAGNOSIS — E039 Hypothyroidism, unspecified: Secondary | ICD-10-CM | POA: Diagnosis not present

## 2017-08-15 DIAGNOSIS — K279 Peptic ulcer, site unspecified, unspecified as acute or chronic, without hemorrhage or perforation: Secondary | ICD-10-CM | POA: Diagnosis not present

## 2017-08-15 DIAGNOSIS — M1991 Primary osteoarthritis, unspecified site: Secondary | ICD-10-CM | POA: Diagnosis not present

## 2017-08-15 DIAGNOSIS — Z951 Presence of aortocoronary bypass graft: Secondary | ICD-10-CM | POA: Diagnosis not present

## 2017-08-15 DIAGNOSIS — I2510 Atherosclerotic heart disease of native coronary artery without angina pectoris: Secondary | ICD-10-CM | POA: Diagnosis not present

## 2017-08-15 DIAGNOSIS — R627 Adult failure to thrive: Secondary | ICD-10-CM | POA: Diagnosis not present

## 2017-08-15 DIAGNOSIS — E785 Hyperlipidemia, unspecified: Secondary | ICD-10-CM | POA: Diagnosis not present

## 2017-08-15 DIAGNOSIS — Z853 Personal history of malignant neoplasm of breast: Secondary | ICD-10-CM | POA: Diagnosis not present

## 2017-08-15 DIAGNOSIS — D649 Anemia, unspecified: Secondary | ICD-10-CM | POA: Diagnosis not present

## 2017-08-15 DIAGNOSIS — M62462 Contracture of muscle, left lower leg: Secondary | ICD-10-CM | POA: Diagnosis not present

## 2017-08-15 DIAGNOSIS — S72002D Fracture of unspecified part of neck of left femur, subsequent encounter for closed fracture with routine healing: Secondary | ICD-10-CM | POA: Diagnosis not present

## 2017-08-15 DIAGNOSIS — K219 Gastro-esophageal reflux disease without esophagitis: Secondary | ICD-10-CM | POA: Diagnosis not present

## 2017-08-16 DIAGNOSIS — K279 Peptic ulcer, site unspecified, unspecified as acute or chronic, without hemorrhage or perforation: Secondary | ICD-10-CM | POA: Diagnosis not present

## 2017-08-16 DIAGNOSIS — Z853 Personal history of malignant neoplasm of breast: Secondary | ICD-10-CM | POA: Diagnosis not present

## 2017-08-16 DIAGNOSIS — R627 Adult failure to thrive: Secondary | ICD-10-CM | POA: Diagnosis not present

## 2017-08-16 DIAGNOSIS — M62462 Contracture of muscle, left lower leg: Secondary | ICD-10-CM | POA: Diagnosis not present

## 2017-08-16 DIAGNOSIS — E785 Hyperlipidemia, unspecified: Secondary | ICD-10-CM | POA: Diagnosis not present

## 2017-08-16 DIAGNOSIS — Z951 Presence of aortocoronary bypass graft: Secondary | ICD-10-CM | POA: Diagnosis not present

## 2017-08-16 DIAGNOSIS — E039 Hypothyroidism, unspecified: Secondary | ICD-10-CM | POA: Diagnosis not present

## 2017-08-16 DIAGNOSIS — I2510 Atherosclerotic heart disease of native coronary artery without angina pectoris: Secondary | ICD-10-CM | POA: Diagnosis not present

## 2017-08-16 DIAGNOSIS — M1991 Primary osteoarthritis, unspecified site: Secondary | ICD-10-CM | POA: Diagnosis not present

## 2017-08-16 DIAGNOSIS — D649 Anemia, unspecified: Secondary | ICD-10-CM | POA: Diagnosis not present

## 2017-08-16 DIAGNOSIS — K219 Gastro-esophageal reflux disease without esophagitis: Secondary | ICD-10-CM | POA: Diagnosis not present

## 2017-08-16 DIAGNOSIS — S72002D Fracture of unspecified part of neck of left femur, subsequent encounter for closed fracture with routine healing: Secondary | ICD-10-CM | POA: Diagnosis not present

## 2017-08-17 DIAGNOSIS — K279 Peptic ulcer, site unspecified, unspecified as acute or chronic, without hemorrhage or perforation: Secondary | ICD-10-CM | POA: Diagnosis not present

## 2017-08-17 DIAGNOSIS — E785 Hyperlipidemia, unspecified: Secondary | ICD-10-CM | POA: Diagnosis not present

## 2017-08-17 DIAGNOSIS — S72002D Fracture of unspecified part of neck of left femur, subsequent encounter for closed fracture with routine healing: Secondary | ICD-10-CM | POA: Diagnosis not present

## 2017-08-17 DIAGNOSIS — M1991 Primary osteoarthritis, unspecified site: Secondary | ICD-10-CM | POA: Diagnosis not present

## 2017-08-17 DIAGNOSIS — D649 Anemia, unspecified: Secondary | ICD-10-CM | POA: Diagnosis not present

## 2017-08-17 DIAGNOSIS — I2510 Atherosclerotic heart disease of native coronary artery without angina pectoris: Secondary | ICD-10-CM | POA: Diagnosis not present

## 2017-08-17 DIAGNOSIS — E039 Hypothyroidism, unspecified: Secondary | ICD-10-CM | POA: Diagnosis not present

## 2017-08-17 DIAGNOSIS — Z951 Presence of aortocoronary bypass graft: Secondary | ICD-10-CM | POA: Diagnosis not present

## 2017-08-17 DIAGNOSIS — M62462 Contracture of muscle, left lower leg: Secondary | ICD-10-CM | POA: Diagnosis not present

## 2017-08-17 DIAGNOSIS — Z853 Personal history of malignant neoplasm of breast: Secondary | ICD-10-CM | POA: Diagnosis not present

## 2017-08-17 DIAGNOSIS — K219 Gastro-esophageal reflux disease without esophagitis: Secondary | ICD-10-CM | POA: Diagnosis not present

## 2017-08-17 DIAGNOSIS — R627 Adult failure to thrive: Secondary | ICD-10-CM | POA: Diagnosis not present

## 2017-08-18 DIAGNOSIS — S72002D Fracture of unspecified part of neck of left femur, subsequent encounter for closed fracture with routine healing: Secondary | ICD-10-CM | POA: Diagnosis not present

## 2017-08-18 DIAGNOSIS — I2510 Atherosclerotic heart disease of native coronary artery without angina pectoris: Secondary | ICD-10-CM | POA: Diagnosis not present

## 2017-08-18 DIAGNOSIS — D649 Anemia, unspecified: Secondary | ICD-10-CM | POA: Diagnosis not present

## 2017-08-18 DIAGNOSIS — K279 Peptic ulcer, site unspecified, unspecified as acute or chronic, without hemorrhage or perforation: Secondary | ICD-10-CM | POA: Diagnosis not present

## 2017-08-18 DIAGNOSIS — M62462 Contracture of muscle, left lower leg: Secondary | ICD-10-CM | POA: Diagnosis not present

## 2017-08-18 DIAGNOSIS — E785 Hyperlipidemia, unspecified: Secondary | ICD-10-CM | POA: Diagnosis not present

## 2017-08-18 DIAGNOSIS — M1991 Primary osteoarthritis, unspecified site: Secondary | ICD-10-CM | POA: Diagnosis not present

## 2017-08-18 DIAGNOSIS — Z951 Presence of aortocoronary bypass graft: Secondary | ICD-10-CM | POA: Diagnosis not present

## 2017-08-18 DIAGNOSIS — K219 Gastro-esophageal reflux disease without esophagitis: Secondary | ICD-10-CM | POA: Diagnosis not present

## 2017-08-18 DIAGNOSIS — E039 Hypothyroidism, unspecified: Secondary | ICD-10-CM | POA: Diagnosis not present

## 2017-08-18 DIAGNOSIS — R627 Adult failure to thrive: Secondary | ICD-10-CM | POA: Diagnosis not present

## 2017-08-18 DIAGNOSIS — Z853 Personal history of malignant neoplasm of breast: Secondary | ICD-10-CM | POA: Diagnosis not present

## 2017-08-19 DIAGNOSIS — Z853 Personal history of malignant neoplasm of breast: Secondary | ICD-10-CM | POA: Diagnosis not present

## 2017-08-19 DIAGNOSIS — S72002D Fracture of unspecified part of neck of left femur, subsequent encounter for closed fracture with routine healing: Secondary | ICD-10-CM | POA: Diagnosis not present

## 2017-08-19 DIAGNOSIS — D649 Anemia, unspecified: Secondary | ICD-10-CM | POA: Diagnosis not present

## 2017-08-19 DIAGNOSIS — M1991 Primary osteoarthritis, unspecified site: Secondary | ICD-10-CM | POA: Diagnosis not present

## 2017-08-19 DIAGNOSIS — Z951 Presence of aortocoronary bypass graft: Secondary | ICD-10-CM | POA: Diagnosis not present

## 2017-08-19 DIAGNOSIS — E039 Hypothyroidism, unspecified: Secondary | ICD-10-CM | POA: Diagnosis not present

## 2017-08-19 DIAGNOSIS — M62462 Contracture of muscle, left lower leg: Secondary | ICD-10-CM | POA: Diagnosis not present

## 2017-08-19 DIAGNOSIS — K219 Gastro-esophageal reflux disease without esophagitis: Secondary | ICD-10-CM | POA: Diagnosis not present

## 2017-08-19 DIAGNOSIS — E785 Hyperlipidemia, unspecified: Secondary | ICD-10-CM | POA: Diagnosis not present

## 2017-08-19 DIAGNOSIS — K279 Peptic ulcer, site unspecified, unspecified as acute or chronic, without hemorrhage or perforation: Secondary | ICD-10-CM | POA: Diagnosis not present

## 2017-08-19 DIAGNOSIS — R627 Adult failure to thrive: Secondary | ICD-10-CM | POA: Diagnosis not present

## 2017-08-19 DIAGNOSIS — I2510 Atherosclerotic heart disease of native coronary artery without angina pectoris: Secondary | ICD-10-CM | POA: Diagnosis not present

## 2017-08-20 DIAGNOSIS — D649 Anemia, unspecified: Secondary | ICD-10-CM | POA: Diagnosis not present

## 2017-08-20 DIAGNOSIS — R627 Adult failure to thrive: Secondary | ICD-10-CM | POA: Diagnosis not present

## 2017-08-20 DIAGNOSIS — K279 Peptic ulcer, site unspecified, unspecified as acute or chronic, without hemorrhage or perforation: Secondary | ICD-10-CM | POA: Diagnosis not present

## 2017-08-20 DIAGNOSIS — I2510 Atherosclerotic heart disease of native coronary artery without angina pectoris: Secondary | ICD-10-CM | POA: Diagnosis not present

## 2017-08-20 DIAGNOSIS — E785 Hyperlipidemia, unspecified: Secondary | ICD-10-CM | POA: Diagnosis not present

## 2017-08-20 DIAGNOSIS — E039 Hypothyroidism, unspecified: Secondary | ICD-10-CM | POA: Diagnosis not present

## 2017-08-20 DIAGNOSIS — M1991 Primary osteoarthritis, unspecified site: Secondary | ICD-10-CM | POA: Diagnosis not present

## 2017-08-20 DIAGNOSIS — S72002D Fracture of unspecified part of neck of left femur, subsequent encounter for closed fracture with routine healing: Secondary | ICD-10-CM | POA: Diagnosis not present

## 2017-08-20 DIAGNOSIS — Z951 Presence of aortocoronary bypass graft: Secondary | ICD-10-CM | POA: Diagnosis not present

## 2017-08-20 DIAGNOSIS — M62462 Contracture of muscle, left lower leg: Secondary | ICD-10-CM | POA: Diagnosis not present

## 2017-08-20 DIAGNOSIS — K219 Gastro-esophageal reflux disease without esophagitis: Secondary | ICD-10-CM | POA: Diagnosis not present

## 2017-08-20 DIAGNOSIS — Z853 Personal history of malignant neoplasm of breast: Secondary | ICD-10-CM | POA: Diagnosis not present

## 2017-08-21 DIAGNOSIS — R627 Adult failure to thrive: Secondary | ICD-10-CM | POA: Diagnosis not present

## 2017-08-21 DIAGNOSIS — M1991 Primary osteoarthritis, unspecified site: Secondary | ICD-10-CM | POA: Diagnosis not present

## 2017-08-21 DIAGNOSIS — S72002D Fracture of unspecified part of neck of left femur, subsequent encounter for closed fracture with routine healing: Secondary | ICD-10-CM | POA: Diagnosis not present

## 2017-08-21 DIAGNOSIS — K279 Peptic ulcer, site unspecified, unspecified as acute or chronic, without hemorrhage or perforation: Secondary | ICD-10-CM | POA: Diagnosis not present

## 2017-08-21 DIAGNOSIS — K219 Gastro-esophageal reflux disease without esophagitis: Secondary | ICD-10-CM | POA: Diagnosis not present

## 2017-08-21 DIAGNOSIS — E785 Hyperlipidemia, unspecified: Secondary | ICD-10-CM | POA: Diagnosis not present

## 2017-08-21 DIAGNOSIS — M62462 Contracture of muscle, left lower leg: Secondary | ICD-10-CM | POA: Diagnosis not present

## 2017-08-21 DIAGNOSIS — E039 Hypothyroidism, unspecified: Secondary | ICD-10-CM | POA: Diagnosis not present

## 2017-08-21 DIAGNOSIS — Z853 Personal history of malignant neoplasm of breast: Secondary | ICD-10-CM | POA: Diagnosis not present

## 2017-08-21 DIAGNOSIS — Z951 Presence of aortocoronary bypass graft: Secondary | ICD-10-CM | POA: Diagnosis not present

## 2017-08-21 DIAGNOSIS — I2510 Atherosclerotic heart disease of native coronary artery without angina pectoris: Secondary | ICD-10-CM | POA: Diagnosis not present

## 2017-08-21 DIAGNOSIS — D649 Anemia, unspecified: Secondary | ICD-10-CM | POA: Diagnosis not present

## 2017-08-22 DIAGNOSIS — M1991 Primary osteoarthritis, unspecified site: Secondary | ICD-10-CM | POA: Diagnosis not present

## 2017-08-22 DIAGNOSIS — R627 Adult failure to thrive: Secondary | ICD-10-CM | POA: Diagnosis not present

## 2017-08-22 DIAGNOSIS — E785 Hyperlipidemia, unspecified: Secondary | ICD-10-CM | POA: Diagnosis not present

## 2017-08-22 DIAGNOSIS — E039 Hypothyroidism, unspecified: Secondary | ICD-10-CM | POA: Diagnosis not present

## 2017-08-22 DIAGNOSIS — D649 Anemia, unspecified: Secondary | ICD-10-CM | POA: Diagnosis not present

## 2017-08-22 DIAGNOSIS — I2510 Atherosclerotic heart disease of native coronary artery without angina pectoris: Secondary | ICD-10-CM | POA: Diagnosis not present

## 2017-08-22 DIAGNOSIS — Z951 Presence of aortocoronary bypass graft: Secondary | ICD-10-CM | POA: Diagnosis not present

## 2017-08-22 DIAGNOSIS — M62462 Contracture of muscle, left lower leg: Secondary | ICD-10-CM | POA: Diagnosis not present

## 2017-08-22 DIAGNOSIS — K219 Gastro-esophageal reflux disease without esophagitis: Secondary | ICD-10-CM | POA: Diagnosis not present

## 2017-08-22 DIAGNOSIS — R52 Pain, unspecified: Secondary | ICD-10-CM | POA: Diagnosis not present

## 2017-08-22 DIAGNOSIS — K279 Peptic ulcer, site unspecified, unspecified as acute or chronic, without hemorrhage or perforation: Secondary | ICD-10-CM | POA: Diagnosis not present

## 2017-08-22 DIAGNOSIS — S72002D Fracture of unspecified part of neck of left femur, subsequent encounter for closed fracture with routine healing: Secondary | ICD-10-CM | POA: Diagnosis not present

## 2017-08-22 DIAGNOSIS — Z853 Personal history of malignant neoplasm of breast: Secondary | ICD-10-CM | POA: Diagnosis not present

## 2017-08-23 DIAGNOSIS — Z951 Presence of aortocoronary bypass graft: Secondary | ICD-10-CM | POA: Diagnosis not present

## 2017-08-23 DIAGNOSIS — M1991 Primary osteoarthritis, unspecified site: Secondary | ICD-10-CM | POA: Diagnosis not present

## 2017-08-23 DIAGNOSIS — S72002D Fracture of unspecified part of neck of left femur, subsequent encounter for closed fracture with routine healing: Secondary | ICD-10-CM | POA: Diagnosis not present

## 2017-08-23 DIAGNOSIS — E785 Hyperlipidemia, unspecified: Secondary | ICD-10-CM | POA: Diagnosis not present

## 2017-08-23 DIAGNOSIS — K219 Gastro-esophageal reflux disease without esophagitis: Secondary | ICD-10-CM | POA: Diagnosis not present

## 2017-08-23 DIAGNOSIS — M62462 Contracture of muscle, left lower leg: Secondary | ICD-10-CM | POA: Diagnosis not present

## 2017-08-23 DIAGNOSIS — K279 Peptic ulcer, site unspecified, unspecified as acute or chronic, without hemorrhage or perforation: Secondary | ICD-10-CM | POA: Diagnosis not present

## 2017-08-23 DIAGNOSIS — R627 Adult failure to thrive: Secondary | ICD-10-CM | POA: Diagnosis not present

## 2017-08-23 DIAGNOSIS — I2510 Atherosclerotic heart disease of native coronary artery without angina pectoris: Secondary | ICD-10-CM | POA: Diagnosis not present

## 2017-08-23 DIAGNOSIS — E039 Hypothyroidism, unspecified: Secondary | ICD-10-CM | POA: Diagnosis not present

## 2017-08-23 DIAGNOSIS — D649 Anemia, unspecified: Secondary | ICD-10-CM | POA: Diagnosis not present

## 2017-08-23 DIAGNOSIS — Z853 Personal history of malignant neoplasm of breast: Secondary | ICD-10-CM | POA: Diagnosis not present

## 2017-08-24 DIAGNOSIS — E039 Hypothyroidism, unspecified: Secondary | ICD-10-CM | POA: Diagnosis not present

## 2017-08-24 DIAGNOSIS — I2510 Atherosclerotic heart disease of native coronary artery without angina pectoris: Secondary | ICD-10-CM | POA: Diagnosis not present

## 2017-08-24 DIAGNOSIS — D649 Anemia, unspecified: Secondary | ICD-10-CM | POA: Diagnosis not present

## 2017-08-24 DIAGNOSIS — M1991 Primary osteoarthritis, unspecified site: Secondary | ICD-10-CM | POA: Diagnosis not present

## 2017-08-24 DIAGNOSIS — K279 Peptic ulcer, site unspecified, unspecified as acute or chronic, without hemorrhage or perforation: Secondary | ICD-10-CM | POA: Diagnosis not present

## 2017-08-24 DIAGNOSIS — S72002D Fracture of unspecified part of neck of left femur, subsequent encounter for closed fracture with routine healing: Secondary | ICD-10-CM | POA: Diagnosis not present

## 2017-08-24 DIAGNOSIS — R627 Adult failure to thrive: Secondary | ICD-10-CM | POA: Diagnosis not present

## 2017-08-24 DIAGNOSIS — E785 Hyperlipidemia, unspecified: Secondary | ICD-10-CM | POA: Diagnosis not present

## 2017-08-24 DIAGNOSIS — Z853 Personal history of malignant neoplasm of breast: Secondary | ICD-10-CM | POA: Diagnosis not present

## 2017-08-24 DIAGNOSIS — M62462 Contracture of muscle, left lower leg: Secondary | ICD-10-CM | POA: Diagnosis not present

## 2017-08-24 DIAGNOSIS — K219 Gastro-esophageal reflux disease without esophagitis: Secondary | ICD-10-CM | POA: Diagnosis not present

## 2017-08-24 DIAGNOSIS — Z951 Presence of aortocoronary bypass graft: Secondary | ICD-10-CM | POA: Diagnosis not present

## 2017-08-25 DIAGNOSIS — I2510 Atherosclerotic heart disease of native coronary artery without angina pectoris: Secondary | ICD-10-CM | POA: Diagnosis not present

## 2017-08-25 DIAGNOSIS — E785 Hyperlipidemia, unspecified: Secondary | ICD-10-CM | POA: Diagnosis not present

## 2017-08-25 DIAGNOSIS — S72002D Fracture of unspecified part of neck of left femur, subsequent encounter for closed fracture with routine healing: Secondary | ICD-10-CM | POA: Diagnosis not present

## 2017-08-25 DIAGNOSIS — D649 Anemia, unspecified: Secondary | ICD-10-CM | POA: Diagnosis not present

## 2017-08-25 DIAGNOSIS — K279 Peptic ulcer, site unspecified, unspecified as acute or chronic, without hemorrhage or perforation: Secondary | ICD-10-CM | POA: Diagnosis not present

## 2017-08-25 DIAGNOSIS — M62462 Contracture of muscle, left lower leg: Secondary | ICD-10-CM | POA: Diagnosis not present

## 2017-08-25 DIAGNOSIS — E039 Hypothyroidism, unspecified: Secondary | ICD-10-CM | POA: Diagnosis not present

## 2017-08-25 DIAGNOSIS — Z951 Presence of aortocoronary bypass graft: Secondary | ICD-10-CM | POA: Diagnosis not present

## 2017-08-25 DIAGNOSIS — R627 Adult failure to thrive: Secondary | ICD-10-CM | POA: Diagnosis not present

## 2017-08-25 DIAGNOSIS — K219 Gastro-esophageal reflux disease without esophagitis: Secondary | ICD-10-CM | POA: Diagnosis not present

## 2017-08-25 DIAGNOSIS — Z853 Personal history of malignant neoplasm of breast: Secondary | ICD-10-CM | POA: Diagnosis not present

## 2017-08-25 DIAGNOSIS — M1991 Primary osteoarthritis, unspecified site: Secondary | ICD-10-CM | POA: Diagnosis not present

## 2017-08-26 DIAGNOSIS — K219 Gastro-esophageal reflux disease without esophagitis: Secondary | ICD-10-CM | POA: Diagnosis not present

## 2017-08-26 DIAGNOSIS — K279 Peptic ulcer, site unspecified, unspecified as acute or chronic, without hemorrhage or perforation: Secondary | ICD-10-CM | POA: Diagnosis not present

## 2017-08-26 DIAGNOSIS — I2510 Atherosclerotic heart disease of native coronary artery without angina pectoris: Secondary | ICD-10-CM | POA: Diagnosis not present

## 2017-08-26 DIAGNOSIS — M62462 Contracture of muscle, left lower leg: Secondary | ICD-10-CM | POA: Diagnosis not present

## 2017-08-26 DIAGNOSIS — E785 Hyperlipidemia, unspecified: Secondary | ICD-10-CM | POA: Diagnosis not present

## 2017-08-26 DIAGNOSIS — Z853 Personal history of malignant neoplasm of breast: Secondary | ICD-10-CM | POA: Diagnosis not present

## 2017-08-26 DIAGNOSIS — M1991 Primary osteoarthritis, unspecified site: Secondary | ICD-10-CM | POA: Diagnosis not present

## 2017-08-26 DIAGNOSIS — Z951 Presence of aortocoronary bypass graft: Secondary | ICD-10-CM | POA: Diagnosis not present

## 2017-08-26 DIAGNOSIS — D649 Anemia, unspecified: Secondary | ICD-10-CM | POA: Diagnosis not present

## 2017-08-26 DIAGNOSIS — R627 Adult failure to thrive: Secondary | ICD-10-CM | POA: Diagnosis not present

## 2017-08-26 DIAGNOSIS — E039 Hypothyroidism, unspecified: Secondary | ICD-10-CM | POA: Diagnosis not present

## 2017-08-26 DIAGNOSIS — S72002D Fracture of unspecified part of neck of left femur, subsequent encounter for closed fracture with routine healing: Secondary | ICD-10-CM | POA: Diagnosis not present

## 2017-08-27 DIAGNOSIS — K279 Peptic ulcer, site unspecified, unspecified as acute or chronic, without hemorrhage or perforation: Secondary | ICD-10-CM | POA: Diagnosis not present

## 2017-08-27 DIAGNOSIS — D649 Anemia, unspecified: Secondary | ICD-10-CM | POA: Diagnosis not present

## 2017-08-27 DIAGNOSIS — K219 Gastro-esophageal reflux disease without esophagitis: Secondary | ICD-10-CM | POA: Diagnosis not present

## 2017-08-27 DIAGNOSIS — M1991 Primary osteoarthritis, unspecified site: Secondary | ICD-10-CM | POA: Diagnosis not present

## 2017-08-27 DIAGNOSIS — I2510 Atherosclerotic heart disease of native coronary artery without angina pectoris: Secondary | ICD-10-CM | POA: Diagnosis not present

## 2017-08-27 DIAGNOSIS — Z853 Personal history of malignant neoplasm of breast: Secondary | ICD-10-CM | POA: Diagnosis not present

## 2017-08-27 DIAGNOSIS — M62462 Contracture of muscle, left lower leg: Secondary | ICD-10-CM | POA: Diagnosis not present

## 2017-08-27 DIAGNOSIS — R627 Adult failure to thrive: Secondary | ICD-10-CM | POA: Diagnosis not present

## 2017-08-27 DIAGNOSIS — Z951 Presence of aortocoronary bypass graft: Secondary | ICD-10-CM | POA: Diagnosis not present

## 2017-08-27 DIAGNOSIS — S72002D Fracture of unspecified part of neck of left femur, subsequent encounter for closed fracture with routine healing: Secondary | ICD-10-CM | POA: Diagnosis not present

## 2017-08-27 DIAGNOSIS — E039 Hypothyroidism, unspecified: Secondary | ICD-10-CM | POA: Diagnosis not present

## 2017-08-27 DIAGNOSIS — E785 Hyperlipidemia, unspecified: Secondary | ICD-10-CM | POA: Diagnosis not present

## 2017-08-28 DIAGNOSIS — Z853 Personal history of malignant neoplasm of breast: Secondary | ICD-10-CM | POA: Diagnosis not present

## 2017-08-28 DIAGNOSIS — M1991 Primary osteoarthritis, unspecified site: Secondary | ICD-10-CM | POA: Diagnosis not present

## 2017-08-28 DIAGNOSIS — E039 Hypothyroidism, unspecified: Secondary | ICD-10-CM | POA: Diagnosis not present

## 2017-08-28 DIAGNOSIS — K279 Peptic ulcer, site unspecified, unspecified as acute or chronic, without hemorrhage or perforation: Secondary | ICD-10-CM | POA: Diagnosis not present

## 2017-08-28 DIAGNOSIS — Z951 Presence of aortocoronary bypass graft: Secondary | ICD-10-CM | POA: Diagnosis not present

## 2017-08-28 DIAGNOSIS — E785 Hyperlipidemia, unspecified: Secondary | ICD-10-CM | POA: Diagnosis not present

## 2017-08-28 DIAGNOSIS — K219 Gastro-esophageal reflux disease without esophagitis: Secondary | ICD-10-CM | POA: Diagnosis not present

## 2017-08-28 DIAGNOSIS — S72002D Fracture of unspecified part of neck of left femur, subsequent encounter for closed fracture with routine healing: Secondary | ICD-10-CM | POA: Diagnosis not present

## 2017-08-28 DIAGNOSIS — M62462 Contracture of muscle, left lower leg: Secondary | ICD-10-CM | POA: Diagnosis not present

## 2017-08-28 DIAGNOSIS — R627 Adult failure to thrive: Secondary | ICD-10-CM | POA: Diagnosis not present

## 2017-08-28 DIAGNOSIS — D649 Anemia, unspecified: Secondary | ICD-10-CM | POA: Diagnosis not present

## 2017-08-28 DIAGNOSIS — I2510 Atherosclerotic heart disease of native coronary artery without angina pectoris: Secondary | ICD-10-CM | POA: Diagnosis not present

## 2017-08-29 DIAGNOSIS — E785 Hyperlipidemia, unspecified: Secondary | ICD-10-CM | POA: Diagnosis not present

## 2017-08-29 DIAGNOSIS — Z951 Presence of aortocoronary bypass graft: Secondary | ICD-10-CM | POA: Diagnosis not present

## 2017-08-29 DIAGNOSIS — K219 Gastro-esophageal reflux disease without esophagitis: Secondary | ICD-10-CM | POA: Diagnosis not present

## 2017-08-29 DIAGNOSIS — E039 Hypothyroidism, unspecified: Secondary | ICD-10-CM | POA: Diagnosis not present

## 2017-08-29 DIAGNOSIS — M62462 Contracture of muscle, left lower leg: Secondary | ICD-10-CM | POA: Diagnosis not present

## 2017-08-29 DIAGNOSIS — D649 Anemia, unspecified: Secondary | ICD-10-CM | POA: Diagnosis not present

## 2017-08-29 DIAGNOSIS — Z853 Personal history of malignant neoplasm of breast: Secondary | ICD-10-CM | POA: Diagnosis not present

## 2017-08-29 DIAGNOSIS — M1991 Primary osteoarthritis, unspecified site: Secondary | ICD-10-CM | POA: Diagnosis not present

## 2017-08-29 DIAGNOSIS — R627 Adult failure to thrive: Secondary | ICD-10-CM | POA: Diagnosis not present

## 2017-08-29 DIAGNOSIS — R296 Repeated falls: Secondary | ICD-10-CM | POA: Diagnosis not present

## 2017-08-29 DIAGNOSIS — K279 Peptic ulcer, site unspecified, unspecified as acute or chronic, without hemorrhage or perforation: Secondary | ICD-10-CM | POA: Diagnosis not present

## 2017-08-29 DIAGNOSIS — S72002D Fracture of unspecified part of neck of left femur, subsequent encounter for closed fracture with routine healing: Secondary | ICD-10-CM | POA: Diagnosis not present

## 2017-08-29 DIAGNOSIS — I2510 Atherosclerotic heart disease of native coronary artery without angina pectoris: Secondary | ICD-10-CM | POA: Diagnosis not present

## 2017-08-30 DIAGNOSIS — M1991 Primary osteoarthritis, unspecified site: Secondary | ICD-10-CM | POA: Diagnosis not present

## 2017-08-30 DIAGNOSIS — S72002D Fracture of unspecified part of neck of left femur, subsequent encounter for closed fracture with routine healing: Secondary | ICD-10-CM | POA: Diagnosis not present

## 2017-08-30 DIAGNOSIS — E039 Hypothyroidism, unspecified: Secondary | ICD-10-CM | POA: Diagnosis not present

## 2017-08-30 DIAGNOSIS — Z951 Presence of aortocoronary bypass graft: Secondary | ICD-10-CM | POA: Diagnosis not present

## 2017-08-30 DIAGNOSIS — K219 Gastro-esophageal reflux disease without esophagitis: Secondary | ICD-10-CM | POA: Diagnosis not present

## 2017-08-30 DIAGNOSIS — D649 Anemia, unspecified: Secondary | ICD-10-CM | POA: Diagnosis not present

## 2017-08-30 DIAGNOSIS — M62462 Contracture of muscle, left lower leg: Secondary | ICD-10-CM | POA: Diagnosis not present

## 2017-08-30 DIAGNOSIS — K279 Peptic ulcer, site unspecified, unspecified as acute or chronic, without hemorrhage or perforation: Secondary | ICD-10-CM | POA: Diagnosis not present

## 2017-08-30 DIAGNOSIS — E785 Hyperlipidemia, unspecified: Secondary | ICD-10-CM | POA: Diagnosis not present

## 2017-08-30 DIAGNOSIS — Z853 Personal history of malignant neoplasm of breast: Secondary | ICD-10-CM | POA: Diagnosis not present

## 2017-08-30 DIAGNOSIS — R627 Adult failure to thrive: Secondary | ICD-10-CM | POA: Diagnosis not present

## 2017-08-30 DIAGNOSIS — R296 Repeated falls: Secondary | ICD-10-CM | POA: Diagnosis not present

## 2017-08-30 DIAGNOSIS — I2510 Atherosclerotic heart disease of native coronary artery without angina pectoris: Secondary | ICD-10-CM | POA: Diagnosis not present

## 2017-08-31 DIAGNOSIS — K279 Peptic ulcer, site unspecified, unspecified as acute or chronic, without hemorrhage or perforation: Secondary | ICD-10-CM | POA: Diagnosis not present

## 2017-08-31 DIAGNOSIS — D649 Anemia, unspecified: Secondary | ICD-10-CM | POA: Diagnosis not present

## 2017-08-31 DIAGNOSIS — K219 Gastro-esophageal reflux disease without esophagitis: Secondary | ICD-10-CM | POA: Diagnosis not present

## 2017-08-31 DIAGNOSIS — I2510 Atherosclerotic heart disease of native coronary artery without angina pectoris: Secondary | ICD-10-CM | POA: Diagnosis not present

## 2017-08-31 DIAGNOSIS — R627 Adult failure to thrive: Secondary | ICD-10-CM | POA: Diagnosis not present

## 2017-08-31 DIAGNOSIS — Z951 Presence of aortocoronary bypass graft: Secondary | ICD-10-CM | POA: Diagnosis not present

## 2017-08-31 DIAGNOSIS — E039 Hypothyroidism, unspecified: Secondary | ICD-10-CM | POA: Diagnosis not present

## 2017-08-31 DIAGNOSIS — Z853 Personal history of malignant neoplasm of breast: Secondary | ICD-10-CM | POA: Diagnosis not present

## 2017-08-31 DIAGNOSIS — M1991 Primary osteoarthritis, unspecified site: Secondary | ICD-10-CM | POA: Diagnosis not present

## 2017-08-31 DIAGNOSIS — M62462 Contracture of muscle, left lower leg: Secondary | ICD-10-CM | POA: Diagnosis not present

## 2017-08-31 DIAGNOSIS — S72002D Fracture of unspecified part of neck of left femur, subsequent encounter for closed fracture with routine healing: Secondary | ICD-10-CM | POA: Diagnosis not present

## 2017-08-31 DIAGNOSIS — E785 Hyperlipidemia, unspecified: Secondary | ICD-10-CM | POA: Diagnosis not present

## 2017-08-31 DIAGNOSIS — R296 Repeated falls: Secondary | ICD-10-CM | POA: Diagnosis not present

## 2017-09-01 DIAGNOSIS — S72002D Fracture of unspecified part of neck of left femur, subsequent encounter for closed fracture with routine healing: Secondary | ICD-10-CM | POA: Diagnosis not present

## 2017-09-01 DIAGNOSIS — Z853 Personal history of malignant neoplasm of breast: Secondary | ICD-10-CM | POA: Diagnosis not present

## 2017-09-01 DIAGNOSIS — M62462 Contracture of muscle, left lower leg: Secondary | ICD-10-CM | POA: Diagnosis not present

## 2017-09-01 DIAGNOSIS — Z951 Presence of aortocoronary bypass graft: Secondary | ICD-10-CM | POA: Diagnosis not present

## 2017-09-01 DIAGNOSIS — I2510 Atherosclerotic heart disease of native coronary artery without angina pectoris: Secondary | ICD-10-CM | POA: Diagnosis not present

## 2017-09-01 DIAGNOSIS — E785 Hyperlipidemia, unspecified: Secondary | ICD-10-CM | POA: Diagnosis not present

## 2017-09-01 DIAGNOSIS — E039 Hypothyroidism, unspecified: Secondary | ICD-10-CM | POA: Diagnosis not present

## 2017-09-01 DIAGNOSIS — D649 Anemia, unspecified: Secondary | ICD-10-CM | POA: Diagnosis not present

## 2017-09-01 DIAGNOSIS — R627 Adult failure to thrive: Secondary | ICD-10-CM | POA: Diagnosis not present

## 2017-09-01 DIAGNOSIS — K219 Gastro-esophageal reflux disease without esophagitis: Secondary | ICD-10-CM | POA: Diagnosis not present

## 2017-09-01 DIAGNOSIS — M1991 Primary osteoarthritis, unspecified site: Secondary | ICD-10-CM | POA: Diagnosis not present

## 2017-09-01 DIAGNOSIS — K279 Peptic ulcer, site unspecified, unspecified as acute or chronic, without hemorrhage or perforation: Secondary | ICD-10-CM | POA: Diagnosis not present

## 2017-09-01 DIAGNOSIS — R296 Repeated falls: Secondary | ICD-10-CM | POA: Diagnosis not present

## 2017-09-02 DIAGNOSIS — S72002D Fracture of unspecified part of neck of left femur, subsequent encounter for closed fracture with routine healing: Secondary | ICD-10-CM | POA: Diagnosis not present

## 2017-09-02 DIAGNOSIS — E785 Hyperlipidemia, unspecified: Secondary | ICD-10-CM | POA: Diagnosis not present

## 2017-09-02 DIAGNOSIS — Z951 Presence of aortocoronary bypass graft: Secondary | ICD-10-CM | POA: Diagnosis not present

## 2017-09-02 DIAGNOSIS — K219 Gastro-esophageal reflux disease without esophagitis: Secondary | ICD-10-CM | POA: Diagnosis not present

## 2017-09-02 DIAGNOSIS — D649 Anemia, unspecified: Secondary | ICD-10-CM | POA: Diagnosis not present

## 2017-09-02 DIAGNOSIS — M1991 Primary osteoarthritis, unspecified site: Secondary | ICD-10-CM | POA: Diagnosis not present

## 2017-09-02 DIAGNOSIS — Z853 Personal history of malignant neoplasm of breast: Secondary | ICD-10-CM | POA: Diagnosis not present

## 2017-09-02 DIAGNOSIS — R627 Adult failure to thrive: Secondary | ICD-10-CM | POA: Diagnosis not present

## 2017-09-02 DIAGNOSIS — M62462 Contracture of muscle, left lower leg: Secondary | ICD-10-CM | POA: Diagnosis not present

## 2017-09-02 DIAGNOSIS — I2510 Atherosclerotic heart disease of native coronary artery without angina pectoris: Secondary | ICD-10-CM | POA: Diagnosis not present

## 2017-09-02 DIAGNOSIS — E039 Hypothyroidism, unspecified: Secondary | ICD-10-CM | POA: Diagnosis not present

## 2017-09-02 DIAGNOSIS — K279 Peptic ulcer, site unspecified, unspecified as acute or chronic, without hemorrhage or perforation: Secondary | ICD-10-CM | POA: Diagnosis not present

## 2017-09-02 DIAGNOSIS — R296 Repeated falls: Secondary | ICD-10-CM | POA: Diagnosis not present

## 2017-09-03 DIAGNOSIS — E039 Hypothyroidism, unspecified: Secondary | ICD-10-CM | POA: Diagnosis not present

## 2017-09-03 DIAGNOSIS — R627 Adult failure to thrive: Secondary | ICD-10-CM | POA: Diagnosis not present

## 2017-09-03 DIAGNOSIS — E785 Hyperlipidemia, unspecified: Secondary | ICD-10-CM | POA: Diagnosis not present

## 2017-09-03 DIAGNOSIS — I2510 Atherosclerotic heart disease of native coronary artery without angina pectoris: Secondary | ICD-10-CM | POA: Diagnosis not present

## 2017-09-03 DIAGNOSIS — R296 Repeated falls: Secondary | ICD-10-CM | POA: Diagnosis not present

## 2017-09-03 DIAGNOSIS — Z951 Presence of aortocoronary bypass graft: Secondary | ICD-10-CM | POA: Diagnosis not present

## 2017-09-03 DIAGNOSIS — M62462 Contracture of muscle, left lower leg: Secondary | ICD-10-CM | POA: Diagnosis not present

## 2017-09-03 DIAGNOSIS — D649 Anemia, unspecified: Secondary | ICD-10-CM | POA: Diagnosis not present

## 2017-09-03 DIAGNOSIS — Z853 Personal history of malignant neoplasm of breast: Secondary | ICD-10-CM | POA: Diagnosis not present

## 2017-09-03 DIAGNOSIS — S72002D Fracture of unspecified part of neck of left femur, subsequent encounter for closed fracture with routine healing: Secondary | ICD-10-CM | POA: Diagnosis not present

## 2017-09-03 DIAGNOSIS — M1991 Primary osteoarthritis, unspecified site: Secondary | ICD-10-CM | POA: Diagnosis not present

## 2017-09-03 DIAGNOSIS — K219 Gastro-esophageal reflux disease without esophagitis: Secondary | ICD-10-CM | POA: Diagnosis not present

## 2017-09-03 DIAGNOSIS — K279 Peptic ulcer, site unspecified, unspecified as acute or chronic, without hemorrhage or perforation: Secondary | ICD-10-CM | POA: Diagnosis not present

## 2017-09-04 DIAGNOSIS — R296 Repeated falls: Secondary | ICD-10-CM | POA: Diagnosis not present

## 2017-09-04 DIAGNOSIS — M62462 Contracture of muscle, left lower leg: Secondary | ICD-10-CM | POA: Diagnosis not present

## 2017-09-04 DIAGNOSIS — E039 Hypothyroidism, unspecified: Secondary | ICD-10-CM | POA: Diagnosis not present

## 2017-09-04 DIAGNOSIS — Z951 Presence of aortocoronary bypass graft: Secondary | ICD-10-CM | POA: Diagnosis not present

## 2017-09-04 DIAGNOSIS — R627 Adult failure to thrive: Secondary | ICD-10-CM | POA: Diagnosis not present

## 2017-09-04 DIAGNOSIS — K279 Peptic ulcer, site unspecified, unspecified as acute or chronic, without hemorrhage or perforation: Secondary | ICD-10-CM | POA: Diagnosis not present

## 2017-09-04 DIAGNOSIS — D649 Anemia, unspecified: Secondary | ICD-10-CM | POA: Diagnosis not present

## 2017-09-04 DIAGNOSIS — K219 Gastro-esophageal reflux disease without esophagitis: Secondary | ICD-10-CM | POA: Diagnosis not present

## 2017-09-04 DIAGNOSIS — I2510 Atherosclerotic heart disease of native coronary artery without angina pectoris: Secondary | ICD-10-CM | POA: Diagnosis not present

## 2017-09-04 DIAGNOSIS — S72002D Fracture of unspecified part of neck of left femur, subsequent encounter for closed fracture with routine healing: Secondary | ICD-10-CM | POA: Diagnosis not present

## 2017-09-04 DIAGNOSIS — E785 Hyperlipidemia, unspecified: Secondary | ICD-10-CM | POA: Diagnosis not present

## 2017-09-04 DIAGNOSIS — Z853 Personal history of malignant neoplasm of breast: Secondary | ICD-10-CM | POA: Diagnosis not present

## 2017-09-04 DIAGNOSIS — M1991 Primary osteoarthritis, unspecified site: Secondary | ICD-10-CM | POA: Diagnosis not present

## 2017-09-05 DIAGNOSIS — E785 Hyperlipidemia, unspecified: Secondary | ICD-10-CM | POA: Diagnosis not present

## 2017-09-05 DIAGNOSIS — R296 Repeated falls: Secondary | ICD-10-CM | POA: Diagnosis not present

## 2017-09-05 DIAGNOSIS — S72002D Fracture of unspecified part of neck of left femur, subsequent encounter for closed fracture with routine healing: Secondary | ICD-10-CM | POA: Diagnosis not present

## 2017-09-05 DIAGNOSIS — Z853 Personal history of malignant neoplasm of breast: Secondary | ICD-10-CM | POA: Diagnosis not present

## 2017-09-05 DIAGNOSIS — Z951 Presence of aortocoronary bypass graft: Secondary | ICD-10-CM | POA: Diagnosis not present

## 2017-09-05 DIAGNOSIS — M1991 Primary osteoarthritis, unspecified site: Secondary | ICD-10-CM | POA: Diagnosis not present

## 2017-09-05 DIAGNOSIS — I2510 Atherosclerotic heart disease of native coronary artery without angina pectoris: Secondary | ICD-10-CM | POA: Diagnosis not present

## 2017-09-05 DIAGNOSIS — M62462 Contracture of muscle, left lower leg: Secondary | ICD-10-CM | POA: Diagnosis not present

## 2017-09-05 DIAGNOSIS — D649 Anemia, unspecified: Secondary | ICD-10-CM | POA: Diagnosis not present

## 2017-09-05 DIAGNOSIS — K279 Peptic ulcer, site unspecified, unspecified as acute or chronic, without hemorrhage or perforation: Secondary | ICD-10-CM | POA: Diagnosis not present

## 2017-09-05 DIAGNOSIS — K219 Gastro-esophageal reflux disease without esophagitis: Secondary | ICD-10-CM | POA: Diagnosis not present

## 2017-09-05 DIAGNOSIS — R627 Adult failure to thrive: Secondary | ICD-10-CM | POA: Diagnosis not present

## 2017-09-05 DIAGNOSIS — E039 Hypothyroidism, unspecified: Secondary | ICD-10-CM | POA: Diagnosis not present

## 2017-09-06 DIAGNOSIS — K279 Peptic ulcer, site unspecified, unspecified as acute or chronic, without hemorrhage or perforation: Secondary | ICD-10-CM | POA: Diagnosis not present

## 2017-09-06 DIAGNOSIS — E039 Hypothyroidism, unspecified: Secondary | ICD-10-CM | POA: Diagnosis not present

## 2017-09-06 DIAGNOSIS — S72002D Fracture of unspecified part of neck of left femur, subsequent encounter for closed fracture with routine healing: Secondary | ICD-10-CM | POA: Diagnosis not present

## 2017-09-06 DIAGNOSIS — Z951 Presence of aortocoronary bypass graft: Secondary | ICD-10-CM | POA: Diagnosis not present

## 2017-09-06 DIAGNOSIS — M1991 Primary osteoarthritis, unspecified site: Secondary | ICD-10-CM | POA: Diagnosis not present

## 2017-09-06 DIAGNOSIS — R296 Repeated falls: Secondary | ICD-10-CM | POA: Diagnosis not present

## 2017-09-06 DIAGNOSIS — M62462 Contracture of muscle, left lower leg: Secondary | ICD-10-CM | POA: Diagnosis not present

## 2017-09-06 DIAGNOSIS — I2510 Atherosclerotic heart disease of native coronary artery without angina pectoris: Secondary | ICD-10-CM | POA: Diagnosis not present

## 2017-09-06 DIAGNOSIS — D649 Anemia, unspecified: Secondary | ICD-10-CM | POA: Diagnosis not present

## 2017-09-06 DIAGNOSIS — E785 Hyperlipidemia, unspecified: Secondary | ICD-10-CM | POA: Diagnosis not present

## 2017-09-06 DIAGNOSIS — R627 Adult failure to thrive: Secondary | ICD-10-CM | POA: Diagnosis not present

## 2017-09-06 DIAGNOSIS — K219 Gastro-esophageal reflux disease without esophagitis: Secondary | ICD-10-CM | POA: Diagnosis not present

## 2017-09-06 DIAGNOSIS — Z853 Personal history of malignant neoplasm of breast: Secondary | ICD-10-CM | POA: Diagnosis not present

## 2017-09-07 DIAGNOSIS — R627 Adult failure to thrive: Secondary | ICD-10-CM | POA: Diagnosis not present

## 2017-09-07 DIAGNOSIS — Z853 Personal history of malignant neoplasm of breast: Secondary | ICD-10-CM | POA: Diagnosis not present

## 2017-09-07 DIAGNOSIS — E785 Hyperlipidemia, unspecified: Secondary | ICD-10-CM | POA: Diagnosis not present

## 2017-09-07 DIAGNOSIS — M1991 Primary osteoarthritis, unspecified site: Secondary | ICD-10-CM | POA: Diagnosis not present

## 2017-09-07 DIAGNOSIS — D649 Anemia, unspecified: Secondary | ICD-10-CM | POA: Diagnosis not present

## 2017-09-07 DIAGNOSIS — S72002D Fracture of unspecified part of neck of left femur, subsequent encounter for closed fracture with routine healing: Secondary | ICD-10-CM | POA: Diagnosis not present

## 2017-09-07 DIAGNOSIS — I2510 Atherosclerotic heart disease of native coronary artery without angina pectoris: Secondary | ICD-10-CM | POA: Diagnosis not present

## 2017-09-07 DIAGNOSIS — M62462 Contracture of muscle, left lower leg: Secondary | ICD-10-CM | POA: Diagnosis not present

## 2017-09-07 DIAGNOSIS — K279 Peptic ulcer, site unspecified, unspecified as acute or chronic, without hemorrhage or perforation: Secondary | ICD-10-CM | POA: Diagnosis not present

## 2017-09-07 DIAGNOSIS — E039 Hypothyroidism, unspecified: Secondary | ICD-10-CM | POA: Diagnosis not present

## 2017-09-07 DIAGNOSIS — Z951 Presence of aortocoronary bypass graft: Secondary | ICD-10-CM | POA: Diagnosis not present

## 2017-09-07 DIAGNOSIS — R296 Repeated falls: Secondary | ICD-10-CM | POA: Diagnosis not present

## 2017-09-07 DIAGNOSIS — K219 Gastro-esophageal reflux disease without esophagitis: Secondary | ICD-10-CM | POA: Diagnosis not present

## 2017-09-08 DIAGNOSIS — Z853 Personal history of malignant neoplasm of breast: Secondary | ICD-10-CM | POA: Diagnosis not present

## 2017-09-08 DIAGNOSIS — M1991 Primary osteoarthritis, unspecified site: Secondary | ICD-10-CM | POA: Diagnosis not present

## 2017-09-08 DIAGNOSIS — I2510 Atherosclerotic heart disease of native coronary artery without angina pectoris: Secondary | ICD-10-CM | POA: Diagnosis not present

## 2017-09-08 DIAGNOSIS — K219 Gastro-esophageal reflux disease without esophagitis: Secondary | ICD-10-CM | POA: Diagnosis not present

## 2017-09-08 DIAGNOSIS — S72002D Fracture of unspecified part of neck of left femur, subsequent encounter for closed fracture with routine healing: Secondary | ICD-10-CM | POA: Diagnosis not present

## 2017-09-08 DIAGNOSIS — K279 Peptic ulcer, site unspecified, unspecified as acute or chronic, without hemorrhage or perforation: Secondary | ICD-10-CM | POA: Diagnosis not present

## 2017-09-08 DIAGNOSIS — M62462 Contracture of muscle, left lower leg: Secondary | ICD-10-CM | POA: Diagnosis not present

## 2017-09-08 DIAGNOSIS — Z951 Presence of aortocoronary bypass graft: Secondary | ICD-10-CM | POA: Diagnosis not present

## 2017-09-08 DIAGNOSIS — E039 Hypothyroidism, unspecified: Secondary | ICD-10-CM | POA: Diagnosis not present

## 2017-09-08 DIAGNOSIS — R296 Repeated falls: Secondary | ICD-10-CM | POA: Diagnosis not present

## 2017-09-08 DIAGNOSIS — E785 Hyperlipidemia, unspecified: Secondary | ICD-10-CM | POA: Diagnosis not present

## 2017-09-08 DIAGNOSIS — D649 Anemia, unspecified: Secondary | ICD-10-CM | POA: Diagnosis not present

## 2017-09-08 DIAGNOSIS — R627 Adult failure to thrive: Secondary | ICD-10-CM | POA: Diagnosis not present

## 2017-09-09 DIAGNOSIS — Z853 Personal history of malignant neoplasm of breast: Secondary | ICD-10-CM | POA: Diagnosis not present

## 2017-09-09 DIAGNOSIS — M62462 Contracture of muscle, left lower leg: Secondary | ICD-10-CM | POA: Diagnosis not present

## 2017-09-09 DIAGNOSIS — S72002D Fracture of unspecified part of neck of left femur, subsequent encounter for closed fracture with routine healing: Secondary | ICD-10-CM | POA: Diagnosis not present

## 2017-09-09 DIAGNOSIS — R296 Repeated falls: Secondary | ICD-10-CM | POA: Diagnosis not present

## 2017-09-09 DIAGNOSIS — D649 Anemia, unspecified: Secondary | ICD-10-CM | POA: Diagnosis not present

## 2017-09-09 DIAGNOSIS — M1991 Primary osteoarthritis, unspecified site: Secondary | ICD-10-CM | POA: Diagnosis not present

## 2017-09-09 DIAGNOSIS — Z951 Presence of aortocoronary bypass graft: Secondary | ICD-10-CM | POA: Diagnosis not present

## 2017-09-09 DIAGNOSIS — R627 Adult failure to thrive: Secondary | ICD-10-CM | POA: Diagnosis not present

## 2017-09-09 DIAGNOSIS — K219 Gastro-esophageal reflux disease without esophagitis: Secondary | ICD-10-CM | POA: Diagnosis not present

## 2017-09-09 DIAGNOSIS — I2510 Atherosclerotic heart disease of native coronary artery without angina pectoris: Secondary | ICD-10-CM | POA: Diagnosis not present

## 2017-09-09 DIAGNOSIS — E039 Hypothyroidism, unspecified: Secondary | ICD-10-CM | POA: Diagnosis not present

## 2017-09-09 DIAGNOSIS — E785 Hyperlipidemia, unspecified: Secondary | ICD-10-CM | POA: Diagnosis not present

## 2017-09-09 DIAGNOSIS — K279 Peptic ulcer, site unspecified, unspecified as acute or chronic, without hemorrhage or perforation: Secondary | ICD-10-CM | POA: Diagnosis not present

## 2017-09-10 DIAGNOSIS — D649 Anemia, unspecified: Secondary | ICD-10-CM | POA: Diagnosis not present

## 2017-09-10 DIAGNOSIS — M62462 Contracture of muscle, left lower leg: Secondary | ICD-10-CM | POA: Diagnosis not present

## 2017-09-10 DIAGNOSIS — E785 Hyperlipidemia, unspecified: Secondary | ICD-10-CM | POA: Diagnosis not present

## 2017-09-10 DIAGNOSIS — S72002D Fracture of unspecified part of neck of left femur, subsequent encounter for closed fracture with routine healing: Secondary | ICD-10-CM | POA: Diagnosis not present

## 2017-09-10 DIAGNOSIS — Z951 Presence of aortocoronary bypass graft: Secondary | ICD-10-CM | POA: Diagnosis not present

## 2017-09-10 DIAGNOSIS — R296 Repeated falls: Secondary | ICD-10-CM | POA: Diagnosis not present

## 2017-09-10 DIAGNOSIS — M1991 Primary osteoarthritis, unspecified site: Secondary | ICD-10-CM | POA: Diagnosis not present

## 2017-09-10 DIAGNOSIS — K279 Peptic ulcer, site unspecified, unspecified as acute or chronic, without hemorrhage or perforation: Secondary | ICD-10-CM | POA: Diagnosis not present

## 2017-09-10 DIAGNOSIS — R627 Adult failure to thrive: Secondary | ICD-10-CM | POA: Diagnosis not present

## 2017-09-10 DIAGNOSIS — E039 Hypothyroidism, unspecified: Secondary | ICD-10-CM | POA: Diagnosis not present

## 2017-09-10 DIAGNOSIS — K219 Gastro-esophageal reflux disease without esophagitis: Secondary | ICD-10-CM | POA: Diagnosis not present

## 2017-09-10 DIAGNOSIS — I2510 Atherosclerotic heart disease of native coronary artery without angina pectoris: Secondary | ICD-10-CM | POA: Diagnosis not present

## 2017-09-10 DIAGNOSIS — Z853 Personal history of malignant neoplasm of breast: Secondary | ICD-10-CM | POA: Diagnosis not present

## 2017-09-11 DIAGNOSIS — K279 Peptic ulcer, site unspecified, unspecified as acute or chronic, without hemorrhage or perforation: Secondary | ICD-10-CM | POA: Diagnosis not present

## 2017-09-11 DIAGNOSIS — I2510 Atherosclerotic heart disease of native coronary artery without angina pectoris: Secondary | ICD-10-CM | POA: Diagnosis not present

## 2017-09-11 DIAGNOSIS — K219 Gastro-esophageal reflux disease without esophagitis: Secondary | ICD-10-CM | POA: Diagnosis not present

## 2017-09-11 DIAGNOSIS — E785 Hyperlipidemia, unspecified: Secondary | ICD-10-CM | POA: Diagnosis not present

## 2017-09-11 DIAGNOSIS — D649 Anemia, unspecified: Secondary | ICD-10-CM | POA: Diagnosis not present

## 2017-09-11 DIAGNOSIS — R627 Adult failure to thrive: Secondary | ICD-10-CM | POA: Diagnosis not present

## 2017-09-11 DIAGNOSIS — M62462 Contracture of muscle, left lower leg: Secondary | ICD-10-CM | POA: Diagnosis not present

## 2017-09-11 DIAGNOSIS — R296 Repeated falls: Secondary | ICD-10-CM | POA: Diagnosis not present

## 2017-09-11 DIAGNOSIS — Z853 Personal history of malignant neoplasm of breast: Secondary | ICD-10-CM | POA: Diagnosis not present

## 2017-09-11 DIAGNOSIS — M1991 Primary osteoarthritis, unspecified site: Secondary | ICD-10-CM | POA: Diagnosis not present

## 2017-09-11 DIAGNOSIS — S72002D Fracture of unspecified part of neck of left femur, subsequent encounter for closed fracture with routine healing: Secondary | ICD-10-CM | POA: Diagnosis not present

## 2017-09-11 DIAGNOSIS — I739 Peripheral vascular disease, unspecified: Secondary | ICD-10-CM | POA: Diagnosis not present

## 2017-09-11 DIAGNOSIS — B351 Tinea unguium: Secondary | ICD-10-CM | POA: Diagnosis not present

## 2017-09-11 DIAGNOSIS — E039 Hypothyroidism, unspecified: Secondary | ICD-10-CM | POA: Diagnosis not present

## 2017-09-11 DIAGNOSIS — Z951 Presence of aortocoronary bypass graft: Secondary | ICD-10-CM | POA: Diagnosis not present

## 2017-09-12 DIAGNOSIS — E785 Hyperlipidemia, unspecified: Secondary | ICD-10-CM | POA: Diagnosis not present

## 2017-09-12 DIAGNOSIS — K219 Gastro-esophageal reflux disease without esophagitis: Secondary | ICD-10-CM | POA: Diagnosis not present

## 2017-09-12 DIAGNOSIS — M1991 Primary osteoarthritis, unspecified site: Secondary | ICD-10-CM | POA: Diagnosis not present

## 2017-09-12 DIAGNOSIS — Z853 Personal history of malignant neoplasm of breast: Secondary | ICD-10-CM | POA: Diagnosis not present

## 2017-09-12 DIAGNOSIS — Z951 Presence of aortocoronary bypass graft: Secondary | ICD-10-CM | POA: Diagnosis not present

## 2017-09-12 DIAGNOSIS — S72002D Fracture of unspecified part of neck of left femur, subsequent encounter for closed fracture with routine healing: Secondary | ICD-10-CM | POA: Diagnosis not present

## 2017-09-12 DIAGNOSIS — R296 Repeated falls: Secondary | ICD-10-CM | POA: Diagnosis not present

## 2017-09-12 DIAGNOSIS — I2510 Atherosclerotic heart disease of native coronary artery without angina pectoris: Secondary | ICD-10-CM | POA: Diagnosis not present

## 2017-09-12 DIAGNOSIS — D649 Anemia, unspecified: Secondary | ICD-10-CM | POA: Diagnosis not present

## 2017-09-12 DIAGNOSIS — M62462 Contracture of muscle, left lower leg: Secondary | ICD-10-CM | POA: Diagnosis not present

## 2017-09-12 DIAGNOSIS — K279 Peptic ulcer, site unspecified, unspecified as acute or chronic, without hemorrhage or perforation: Secondary | ICD-10-CM | POA: Diagnosis not present

## 2017-09-12 DIAGNOSIS — E039 Hypothyroidism, unspecified: Secondary | ICD-10-CM | POA: Diagnosis not present

## 2017-09-12 DIAGNOSIS — R627 Adult failure to thrive: Secondary | ICD-10-CM | POA: Diagnosis not present

## 2017-09-13 DIAGNOSIS — S72002D Fracture of unspecified part of neck of left femur, subsequent encounter for closed fracture with routine healing: Secondary | ICD-10-CM | POA: Diagnosis not present

## 2017-09-13 DIAGNOSIS — I2510 Atherosclerotic heart disease of native coronary artery without angina pectoris: Secondary | ICD-10-CM | POA: Diagnosis not present

## 2017-09-13 DIAGNOSIS — Z853 Personal history of malignant neoplasm of breast: Secondary | ICD-10-CM | POA: Diagnosis not present

## 2017-09-13 DIAGNOSIS — K279 Peptic ulcer, site unspecified, unspecified as acute or chronic, without hemorrhage or perforation: Secondary | ICD-10-CM | POA: Diagnosis not present

## 2017-09-13 DIAGNOSIS — M62462 Contracture of muscle, left lower leg: Secondary | ICD-10-CM | POA: Diagnosis not present

## 2017-09-13 DIAGNOSIS — D649 Anemia, unspecified: Secondary | ICD-10-CM | POA: Diagnosis not present

## 2017-09-13 DIAGNOSIS — E785 Hyperlipidemia, unspecified: Secondary | ICD-10-CM | POA: Diagnosis not present

## 2017-09-13 DIAGNOSIS — M1991 Primary osteoarthritis, unspecified site: Secondary | ICD-10-CM | POA: Diagnosis not present

## 2017-09-13 DIAGNOSIS — E039 Hypothyroidism, unspecified: Secondary | ICD-10-CM | POA: Diagnosis not present

## 2017-09-13 DIAGNOSIS — K219 Gastro-esophageal reflux disease without esophagitis: Secondary | ICD-10-CM | POA: Diagnosis not present

## 2017-09-13 DIAGNOSIS — R296 Repeated falls: Secondary | ICD-10-CM | POA: Diagnosis not present

## 2017-09-13 DIAGNOSIS — R627 Adult failure to thrive: Secondary | ICD-10-CM | POA: Diagnosis not present

## 2017-09-13 DIAGNOSIS — Z951 Presence of aortocoronary bypass graft: Secondary | ICD-10-CM | POA: Diagnosis not present

## 2017-09-14 DIAGNOSIS — M62462 Contracture of muscle, left lower leg: Secondary | ICD-10-CM | POA: Diagnosis not present

## 2017-09-14 DIAGNOSIS — Z853 Personal history of malignant neoplasm of breast: Secondary | ICD-10-CM | POA: Diagnosis not present

## 2017-09-14 DIAGNOSIS — S72002D Fracture of unspecified part of neck of left femur, subsequent encounter for closed fracture with routine healing: Secondary | ICD-10-CM | POA: Diagnosis not present

## 2017-09-14 DIAGNOSIS — R296 Repeated falls: Secondary | ICD-10-CM | POA: Diagnosis not present

## 2017-09-14 DIAGNOSIS — M1991 Primary osteoarthritis, unspecified site: Secondary | ICD-10-CM | POA: Diagnosis not present

## 2017-09-14 DIAGNOSIS — K279 Peptic ulcer, site unspecified, unspecified as acute or chronic, without hemorrhage or perforation: Secondary | ICD-10-CM | POA: Diagnosis not present

## 2017-09-14 DIAGNOSIS — K219 Gastro-esophageal reflux disease without esophagitis: Secondary | ICD-10-CM | POA: Diagnosis not present

## 2017-09-14 DIAGNOSIS — R627 Adult failure to thrive: Secondary | ICD-10-CM | POA: Diagnosis not present

## 2017-09-14 DIAGNOSIS — E785 Hyperlipidemia, unspecified: Secondary | ICD-10-CM | POA: Diagnosis not present

## 2017-09-14 DIAGNOSIS — I2510 Atherosclerotic heart disease of native coronary artery without angina pectoris: Secondary | ICD-10-CM | POA: Diagnosis not present

## 2017-09-14 DIAGNOSIS — Z951 Presence of aortocoronary bypass graft: Secondary | ICD-10-CM | POA: Diagnosis not present

## 2017-09-14 DIAGNOSIS — E039 Hypothyroidism, unspecified: Secondary | ICD-10-CM | POA: Diagnosis not present

## 2017-09-14 DIAGNOSIS — D649 Anemia, unspecified: Secondary | ICD-10-CM | POA: Diagnosis not present

## 2017-09-15 DIAGNOSIS — S72002D Fracture of unspecified part of neck of left femur, subsequent encounter for closed fracture with routine healing: Secondary | ICD-10-CM | POA: Diagnosis not present

## 2017-09-15 DIAGNOSIS — E785 Hyperlipidemia, unspecified: Secondary | ICD-10-CM | POA: Diagnosis not present

## 2017-09-15 DIAGNOSIS — D649 Anemia, unspecified: Secondary | ICD-10-CM | POA: Diagnosis not present

## 2017-09-15 DIAGNOSIS — Z853 Personal history of malignant neoplasm of breast: Secondary | ICD-10-CM | POA: Diagnosis not present

## 2017-09-15 DIAGNOSIS — I2510 Atherosclerotic heart disease of native coronary artery without angina pectoris: Secondary | ICD-10-CM | POA: Diagnosis not present

## 2017-09-15 DIAGNOSIS — M62462 Contracture of muscle, left lower leg: Secondary | ICD-10-CM | POA: Diagnosis not present

## 2017-09-15 DIAGNOSIS — R627 Adult failure to thrive: Secondary | ICD-10-CM | POA: Diagnosis not present

## 2017-09-15 DIAGNOSIS — K219 Gastro-esophageal reflux disease without esophagitis: Secondary | ICD-10-CM | POA: Diagnosis not present

## 2017-09-15 DIAGNOSIS — Z951 Presence of aortocoronary bypass graft: Secondary | ICD-10-CM | POA: Diagnosis not present

## 2017-09-15 DIAGNOSIS — R296 Repeated falls: Secondary | ICD-10-CM | POA: Diagnosis not present

## 2017-09-15 DIAGNOSIS — E039 Hypothyroidism, unspecified: Secondary | ICD-10-CM | POA: Diagnosis not present

## 2017-09-15 DIAGNOSIS — K279 Peptic ulcer, site unspecified, unspecified as acute or chronic, without hemorrhage or perforation: Secondary | ICD-10-CM | POA: Diagnosis not present

## 2017-09-15 DIAGNOSIS — M1991 Primary osteoarthritis, unspecified site: Secondary | ICD-10-CM | POA: Diagnosis not present

## 2017-09-16 DIAGNOSIS — Z853 Personal history of malignant neoplasm of breast: Secondary | ICD-10-CM | POA: Diagnosis not present

## 2017-09-16 DIAGNOSIS — R627 Adult failure to thrive: Secondary | ICD-10-CM | POA: Diagnosis not present

## 2017-09-16 DIAGNOSIS — R296 Repeated falls: Secondary | ICD-10-CM | POA: Diagnosis not present

## 2017-09-16 DIAGNOSIS — K279 Peptic ulcer, site unspecified, unspecified as acute or chronic, without hemorrhage or perforation: Secondary | ICD-10-CM | POA: Diagnosis not present

## 2017-09-16 DIAGNOSIS — E039 Hypothyroidism, unspecified: Secondary | ICD-10-CM | POA: Diagnosis not present

## 2017-09-16 DIAGNOSIS — E785 Hyperlipidemia, unspecified: Secondary | ICD-10-CM | POA: Diagnosis not present

## 2017-09-16 DIAGNOSIS — S72002D Fracture of unspecified part of neck of left femur, subsequent encounter for closed fracture with routine healing: Secondary | ICD-10-CM | POA: Diagnosis not present

## 2017-09-16 DIAGNOSIS — D649 Anemia, unspecified: Secondary | ICD-10-CM | POA: Diagnosis not present

## 2017-09-16 DIAGNOSIS — I2510 Atherosclerotic heart disease of native coronary artery without angina pectoris: Secondary | ICD-10-CM | POA: Diagnosis not present

## 2017-09-16 DIAGNOSIS — K219 Gastro-esophageal reflux disease without esophagitis: Secondary | ICD-10-CM | POA: Diagnosis not present

## 2017-09-16 DIAGNOSIS — M62462 Contracture of muscle, left lower leg: Secondary | ICD-10-CM | POA: Diagnosis not present

## 2017-09-16 DIAGNOSIS — M1991 Primary osteoarthritis, unspecified site: Secondary | ICD-10-CM | POA: Diagnosis not present

## 2017-09-16 DIAGNOSIS — Z951 Presence of aortocoronary bypass graft: Secondary | ICD-10-CM | POA: Diagnosis not present

## 2017-09-17 DIAGNOSIS — S72002D Fracture of unspecified part of neck of left femur, subsequent encounter for closed fracture with routine healing: Secondary | ICD-10-CM | POA: Diagnosis not present

## 2017-09-17 DIAGNOSIS — R296 Repeated falls: Secondary | ICD-10-CM | POA: Diagnosis not present

## 2017-09-17 DIAGNOSIS — M62462 Contracture of muscle, left lower leg: Secondary | ICD-10-CM | POA: Diagnosis not present

## 2017-09-17 DIAGNOSIS — M8000XD Age-related osteoporosis with current pathological fracture, unspecified site, subsequent encounter for fracture with routine healing: Secondary | ICD-10-CM | POA: Diagnosis not present

## 2017-09-17 DIAGNOSIS — E441 Mild protein-calorie malnutrition: Secondary | ICD-10-CM | POA: Diagnosis not present

## 2017-09-17 DIAGNOSIS — I2510 Atherosclerotic heart disease of native coronary artery without angina pectoris: Secondary | ICD-10-CM | POA: Diagnosis not present

## 2017-09-17 DIAGNOSIS — K219 Gastro-esophageal reflux disease without esophagitis: Secondary | ICD-10-CM | POA: Diagnosis not present

## 2017-09-17 DIAGNOSIS — M1991 Primary osteoarthritis, unspecified site: Secondary | ICD-10-CM | POA: Diagnosis not present

## 2017-09-17 DIAGNOSIS — K279 Peptic ulcer, site unspecified, unspecified as acute or chronic, without hemorrhage or perforation: Secondary | ICD-10-CM | POA: Diagnosis not present

## 2017-09-17 DIAGNOSIS — Z951 Presence of aortocoronary bypass graft: Secondary | ICD-10-CM | POA: Diagnosis not present

## 2017-09-17 DIAGNOSIS — R627 Adult failure to thrive: Secondary | ICD-10-CM | POA: Diagnosis not present

## 2017-09-17 DIAGNOSIS — E039 Hypothyroidism, unspecified: Secondary | ICD-10-CM | POA: Diagnosis not present

## 2017-09-17 DIAGNOSIS — E785 Hyperlipidemia, unspecified: Secondary | ICD-10-CM | POA: Diagnosis not present

## 2017-09-17 DIAGNOSIS — D649 Anemia, unspecified: Secondary | ICD-10-CM | POA: Diagnosis not present

## 2017-09-17 DIAGNOSIS — Z853 Personal history of malignant neoplasm of breast: Secondary | ICD-10-CM | POA: Diagnosis not present

## 2017-09-18 DIAGNOSIS — E039 Hypothyroidism, unspecified: Secondary | ICD-10-CM | POA: Diagnosis not present

## 2017-09-18 DIAGNOSIS — Z853 Personal history of malignant neoplasm of breast: Secondary | ICD-10-CM | POA: Diagnosis not present

## 2017-09-18 DIAGNOSIS — K279 Peptic ulcer, site unspecified, unspecified as acute or chronic, without hemorrhage or perforation: Secondary | ICD-10-CM | POA: Diagnosis not present

## 2017-09-18 DIAGNOSIS — Z951 Presence of aortocoronary bypass graft: Secondary | ICD-10-CM | POA: Diagnosis not present

## 2017-09-18 DIAGNOSIS — K219 Gastro-esophageal reflux disease without esophagitis: Secondary | ICD-10-CM | POA: Diagnosis not present

## 2017-09-18 DIAGNOSIS — E785 Hyperlipidemia, unspecified: Secondary | ICD-10-CM | POA: Diagnosis not present

## 2017-09-18 DIAGNOSIS — M62462 Contracture of muscle, left lower leg: Secondary | ICD-10-CM | POA: Diagnosis not present

## 2017-09-18 DIAGNOSIS — R627 Adult failure to thrive: Secondary | ICD-10-CM | POA: Diagnosis not present

## 2017-09-18 DIAGNOSIS — R296 Repeated falls: Secondary | ICD-10-CM | POA: Diagnosis not present

## 2017-09-18 DIAGNOSIS — I2510 Atherosclerotic heart disease of native coronary artery without angina pectoris: Secondary | ICD-10-CM | POA: Diagnosis not present

## 2017-09-18 DIAGNOSIS — M1991 Primary osteoarthritis, unspecified site: Secondary | ICD-10-CM | POA: Diagnosis not present

## 2017-09-18 DIAGNOSIS — D649 Anemia, unspecified: Secondary | ICD-10-CM | POA: Diagnosis not present

## 2017-09-18 DIAGNOSIS — S72002D Fracture of unspecified part of neck of left femur, subsequent encounter for closed fracture with routine healing: Secondary | ICD-10-CM | POA: Diagnosis not present

## 2017-09-19 DIAGNOSIS — K279 Peptic ulcer, site unspecified, unspecified as acute or chronic, without hemorrhage or perforation: Secondary | ICD-10-CM | POA: Diagnosis not present

## 2017-09-19 DIAGNOSIS — R296 Repeated falls: Secondary | ICD-10-CM | POA: Diagnosis not present

## 2017-09-19 DIAGNOSIS — S72002D Fracture of unspecified part of neck of left femur, subsequent encounter for closed fracture with routine healing: Secondary | ICD-10-CM | POA: Diagnosis not present

## 2017-09-19 DIAGNOSIS — E039 Hypothyroidism, unspecified: Secondary | ICD-10-CM | POA: Diagnosis not present

## 2017-09-19 DIAGNOSIS — Z853 Personal history of malignant neoplasm of breast: Secondary | ICD-10-CM | POA: Diagnosis not present

## 2017-09-19 DIAGNOSIS — E785 Hyperlipidemia, unspecified: Secondary | ICD-10-CM | POA: Diagnosis not present

## 2017-09-19 DIAGNOSIS — I2510 Atherosclerotic heart disease of native coronary artery without angina pectoris: Secondary | ICD-10-CM | POA: Diagnosis not present

## 2017-09-19 DIAGNOSIS — M1991 Primary osteoarthritis, unspecified site: Secondary | ICD-10-CM | POA: Diagnosis not present

## 2017-09-19 DIAGNOSIS — K219 Gastro-esophageal reflux disease without esophagitis: Secondary | ICD-10-CM | POA: Diagnosis not present

## 2017-09-19 DIAGNOSIS — M62462 Contracture of muscle, left lower leg: Secondary | ICD-10-CM | POA: Diagnosis not present

## 2017-09-19 DIAGNOSIS — D649 Anemia, unspecified: Secondary | ICD-10-CM | POA: Diagnosis not present

## 2017-09-19 DIAGNOSIS — Z951 Presence of aortocoronary bypass graft: Secondary | ICD-10-CM | POA: Diagnosis not present

## 2017-09-19 DIAGNOSIS — R627 Adult failure to thrive: Secondary | ICD-10-CM | POA: Diagnosis not present

## 2017-09-20 DIAGNOSIS — K279 Peptic ulcer, site unspecified, unspecified as acute or chronic, without hemorrhage or perforation: Secondary | ICD-10-CM | POA: Diagnosis not present

## 2017-09-20 DIAGNOSIS — D649 Anemia, unspecified: Secondary | ICD-10-CM | POA: Diagnosis not present

## 2017-09-20 DIAGNOSIS — R627 Adult failure to thrive: Secondary | ICD-10-CM | POA: Diagnosis not present

## 2017-09-20 DIAGNOSIS — I2510 Atherosclerotic heart disease of native coronary artery without angina pectoris: Secondary | ICD-10-CM | POA: Diagnosis not present

## 2017-09-20 DIAGNOSIS — S72002D Fracture of unspecified part of neck of left femur, subsequent encounter for closed fracture with routine healing: Secondary | ICD-10-CM | POA: Diagnosis not present

## 2017-09-20 DIAGNOSIS — M62462 Contracture of muscle, left lower leg: Secondary | ICD-10-CM | POA: Diagnosis not present

## 2017-09-20 DIAGNOSIS — R296 Repeated falls: Secondary | ICD-10-CM | POA: Diagnosis not present

## 2017-09-20 DIAGNOSIS — E785 Hyperlipidemia, unspecified: Secondary | ICD-10-CM | POA: Diagnosis not present

## 2017-09-20 DIAGNOSIS — Z951 Presence of aortocoronary bypass graft: Secondary | ICD-10-CM | POA: Diagnosis not present

## 2017-09-20 DIAGNOSIS — E039 Hypothyroidism, unspecified: Secondary | ICD-10-CM | POA: Diagnosis not present

## 2017-09-20 DIAGNOSIS — Z853 Personal history of malignant neoplasm of breast: Secondary | ICD-10-CM | POA: Diagnosis not present

## 2017-09-20 DIAGNOSIS — K219 Gastro-esophageal reflux disease without esophagitis: Secondary | ICD-10-CM | POA: Diagnosis not present

## 2017-09-20 DIAGNOSIS — M1991 Primary osteoarthritis, unspecified site: Secondary | ICD-10-CM | POA: Diagnosis not present

## 2017-09-21 DIAGNOSIS — I2510 Atherosclerotic heart disease of native coronary artery without angina pectoris: Secondary | ICD-10-CM | POA: Diagnosis not present

## 2017-09-21 DIAGNOSIS — M1991 Primary osteoarthritis, unspecified site: Secondary | ICD-10-CM | POA: Diagnosis not present

## 2017-09-21 DIAGNOSIS — Z951 Presence of aortocoronary bypass graft: Secondary | ICD-10-CM | POA: Diagnosis not present

## 2017-09-21 DIAGNOSIS — K279 Peptic ulcer, site unspecified, unspecified as acute or chronic, without hemorrhage or perforation: Secondary | ICD-10-CM | POA: Diagnosis not present

## 2017-09-21 DIAGNOSIS — E785 Hyperlipidemia, unspecified: Secondary | ICD-10-CM | POA: Diagnosis not present

## 2017-09-21 DIAGNOSIS — R627 Adult failure to thrive: Secondary | ICD-10-CM | POA: Diagnosis not present

## 2017-09-21 DIAGNOSIS — D649 Anemia, unspecified: Secondary | ICD-10-CM | POA: Diagnosis not present

## 2017-09-21 DIAGNOSIS — S72002D Fracture of unspecified part of neck of left femur, subsequent encounter for closed fracture with routine healing: Secondary | ICD-10-CM | POA: Diagnosis not present

## 2017-09-21 DIAGNOSIS — E039 Hypothyroidism, unspecified: Secondary | ICD-10-CM | POA: Diagnosis not present

## 2017-09-21 DIAGNOSIS — K219 Gastro-esophageal reflux disease without esophagitis: Secondary | ICD-10-CM | POA: Diagnosis not present

## 2017-09-21 DIAGNOSIS — R296 Repeated falls: Secondary | ICD-10-CM | POA: Diagnosis not present

## 2017-09-21 DIAGNOSIS — Z853 Personal history of malignant neoplasm of breast: Secondary | ICD-10-CM | POA: Diagnosis not present

## 2017-09-21 DIAGNOSIS — M62462 Contracture of muscle, left lower leg: Secondary | ICD-10-CM | POA: Diagnosis not present

## 2017-09-22 DIAGNOSIS — M1991 Primary osteoarthritis, unspecified site: Secondary | ICD-10-CM | POA: Diagnosis not present

## 2017-09-22 DIAGNOSIS — Z853 Personal history of malignant neoplasm of breast: Secondary | ICD-10-CM | POA: Diagnosis not present

## 2017-09-22 DIAGNOSIS — K279 Peptic ulcer, site unspecified, unspecified as acute or chronic, without hemorrhage or perforation: Secondary | ICD-10-CM | POA: Diagnosis not present

## 2017-09-22 DIAGNOSIS — D649 Anemia, unspecified: Secondary | ICD-10-CM | POA: Diagnosis not present

## 2017-09-22 DIAGNOSIS — R627 Adult failure to thrive: Secondary | ICD-10-CM | POA: Diagnosis not present

## 2017-09-22 DIAGNOSIS — M62462 Contracture of muscle, left lower leg: Secondary | ICD-10-CM | POA: Diagnosis not present

## 2017-09-22 DIAGNOSIS — I2510 Atherosclerotic heart disease of native coronary artery without angina pectoris: Secondary | ICD-10-CM | POA: Diagnosis not present

## 2017-09-22 DIAGNOSIS — K219 Gastro-esophageal reflux disease without esophagitis: Secondary | ICD-10-CM | POA: Diagnosis not present

## 2017-09-22 DIAGNOSIS — Z951 Presence of aortocoronary bypass graft: Secondary | ICD-10-CM | POA: Diagnosis not present

## 2017-09-22 DIAGNOSIS — R296 Repeated falls: Secondary | ICD-10-CM | POA: Diagnosis not present

## 2017-09-22 DIAGNOSIS — S72002D Fracture of unspecified part of neck of left femur, subsequent encounter for closed fracture with routine healing: Secondary | ICD-10-CM | POA: Diagnosis not present

## 2017-09-22 DIAGNOSIS — E039 Hypothyroidism, unspecified: Secondary | ICD-10-CM | POA: Diagnosis not present

## 2017-09-22 DIAGNOSIS — E785 Hyperlipidemia, unspecified: Secondary | ICD-10-CM | POA: Diagnosis not present

## 2017-09-23 DIAGNOSIS — D649 Anemia, unspecified: Secondary | ICD-10-CM | POA: Diagnosis not present

## 2017-09-23 DIAGNOSIS — R296 Repeated falls: Secondary | ICD-10-CM | POA: Diagnosis not present

## 2017-09-23 DIAGNOSIS — E785 Hyperlipidemia, unspecified: Secondary | ICD-10-CM | POA: Diagnosis not present

## 2017-09-23 DIAGNOSIS — R627 Adult failure to thrive: Secondary | ICD-10-CM | POA: Diagnosis not present

## 2017-09-23 DIAGNOSIS — E039 Hypothyroidism, unspecified: Secondary | ICD-10-CM | POA: Diagnosis not present

## 2017-09-23 DIAGNOSIS — Z853 Personal history of malignant neoplasm of breast: Secondary | ICD-10-CM | POA: Diagnosis not present

## 2017-09-23 DIAGNOSIS — K219 Gastro-esophageal reflux disease without esophagitis: Secondary | ICD-10-CM | POA: Diagnosis not present

## 2017-09-23 DIAGNOSIS — K279 Peptic ulcer, site unspecified, unspecified as acute or chronic, without hemorrhage or perforation: Secondary | ICD-10-CM | POA: Diagnosis not present

## 2017-09-23 DIAGNOSIS — M62462 Contracture of muscle, left lower leg: Secondary | ICD-10-CM | POA: Diagnosis not present

## 2017-09-23 DIAGNOSIS — M1991 Primary osteoarthritis, unspecified site: Secondary | ICD-10-CM | POA: Diagnosis not present

## 2017-09-23 DIAGNOSIS — Z951 Presence of aortocoronary bypass graft: Secondary | ICD-10-CM | POA: Diagnosis not present

## 2017-09-23 DIAGNOSIS — S72002D Fracture of unspecified part of neck of left femur, subsequent encounter for closed fracture with routine healing: Secondary | ICD-10-CM | POA: Diagnosis not present

## 2017-09-23 DIAGNOSIS — I2510 Atherosclerotic heart disease of native coronary artery without angina pectoris: Secondary | ICD-10-CM | POA: Diagnosis not present

## 2017-09-24 DIAGNOSIS — M1991 Primary osteoarthritis, unspecified site: Secondary | ICD-10-CM | POA: Diagnosis not present

## 2017-09-24 DIAGNOSIS — R627 Adult failure to thrive: Secondary | ICD-10-CM | POA: Diagnosis not present

## 2017-09-24 DIAGNOSIS — K219 Gastro-esophageal reflux disease without esophagitis: Secondary | ICD-10-CM | POA: Diagnosis not present

## 2017-09-24 DIAGNOSIS — Z951 Presence of aortocoronary bypass graft: Secondary | ICD-10-CM | POA: Diagnosis not present

## 2017-09-24 DIAGNOSIS — I2510 Atherosclerotic heart disease of native coronary artery without angina pectoris: Secondary | ICD-10-CM | POA: Diagnosis not present

## 2017-09-24 DIAGNOSIS — K279 Peptic ulcer, site unspecified, unspecified as acute or chronic, without hemorrhage or perforation: Secondary | ICD-10-CM | POA: Diagnosis not present

## 2017-09-24 DIAGNOSIS — D649 Anemia, unspecified: Secondary | ICD-10-CM | POA: Diagnosis not present

## 2017-09-24 DIAGNOSIS — E039 Hypothyroidism, unspecified: Secondary | ICD-10-CM | POA: Diagnosis not present

## 2017-09-24 DIAGNOSIS — M62462 Contracture of muscle, left lower leg: Secondary | ICD-10-CM | POA: Diagnosis not present

## 2017-09-24 DIAGNOSIS — R296 Repeated falls: Secondary | ICD-10-CM | POA: Diagnosis not present

## 2017-09-24 DIAGNOSIS — S72002D Fracture of unspecified part of neck of left femur, subsequent encounter for closed fracture with routine healing: Secondary | ICD-10-CM | POA: Diagnosis not present

## 2017-09-24 DIAGNOSIS — E785 Hyperlipidemia, unspecified: Secondary | ICD-10-CM | POA: Diagnosis not present

## 2017-09-24 DIAGNOSIS — Z853 Personal history of malignant neoplasm of breast: Secondary | ICD-10-CM | POA: Diagnosis not present

## 2017-09-25 DIAGNOSIS — E039 Hypothyroidism, unspecified: Secondary | ICD-10-CM | POA: Diagnosis not present

## 2017-09-25 DIAGNOSIS — D649 Anemia, unspecified: Secondary | ICD-10-CM | POA: Diagnosis not present

## 2017-09-25 DIAGNOSIS — I2510 Atherosclerotic heart disease of native coronary artery without angina pectoris: Secondary | ICD-10-CM | POA: Diagnosis not present

## 2017-09-25 DIAGNOSIS — E785 Hyperlipidemia, unspecified: Secondary | ICD-10-CM | POA: Diagnosis not present

## 2017-09-25 DIAGNOSIS — M1991 Primary osteoarthritis, unspecified site: Secondary | ICD-10-CM | POA: Diagnosis not present

## 2017-09-25 DIAGNOSIS — K219 Gastro-esophageal reflux disease without esophagitis: Secondary | ICD-10-CM | POA: Diagnosis not present

## 2017-09-25 DIAGNOSIS — R296 Repeated falls: Secondary | ICD-10-CM | POA: Diagnosis not present

## 2017-09-25 DIAGNOSIS — M62462 Contracture of muscle, left lower leg: Secondary | ICD-10-CM | POA: Diagnosis not present

## 2017-09-25 DIAGNOSIS — Z853 Personal history of malignant neoplasm of breast: Secondary | ICD-10-CM | POA: Diagnosis not present

## 2017-09-25 DIAGNOSIS — R627 Adult failure to thrive: Secondary | ICD-10-CM | POA: Diagnosis not present

## 2017-09-25 DIAGNOSIS — Z951 Presence of aortocoronary bypass graft: Secondary | ICD-10-CM | POA: Diagnosis not present

## 2017-09-25 DIAGNOSIS — S72002D Fracture of unspecified part of neck of left femur, subsequent encounter for closed fracture with routine healing: Secondary | ICD-10-CM | POA: Diagnosis not present

## 2017-09-25 DIAGNOSIS — K279 Peptic ulcer, site unspecified, unspecified as acute or chronic, without hemorrhage or perforation: Secondary | ICD-10-CM | POA: Diagnosis not present

## 2017-09-26 DIAGNOSIS — R627 Adult failure to thrive: Secondary | ICD-10-CM | POA: Diagnosis not present

## 2017-09-26 DIAGNOSIS — E785 Hyperlipidemia, unspecified: Secondary | ICD-10-CM | POA: Diagnosis not present

## 2017-09-26 DIAGNOSIS — Z951 Presence of aortocoronary bypass graft: Secondary | ICD-10-CM | POA: Diagnosis not present

## 2017-09-26 DIAGNOSIS — M1991 Primary osteoarthritis, unspecified site: Secondary | ICD-10-CM | POA: Diagnosis not present

## 2017-09-26 DIAGNOSIS — S72002D Fracture of unspecified part of neck of left femur, subsequent encounter for closed fracture with routine healing: Secondary | ICD-10-CM | POA: Diagnosis not present

## 2017-09-26 DIAGNOSIS — Z853 Personal history of malignant neoplasm of breast: Secondary | ICD-10-CM | POA: Diagnosis not present

## 2017-09-26 DIAGNOSIS — M62462 Contracture of muscle, left lower leg: Secondary | ICD-10-CM | POA: Diagnosis not present

## 2017-09-26 DIAGNOSIS — R296 Repeated falls: Secondary | ICD-10-CM | POA: Diagnosis not present

## 2017-09-26 DIAGNOSIS — E039 Hypothyroidism, unspecified: Secondary | ICD-10-CM | POA: Diagnosis not present

## 2017-09-26 DIAGNOSIS — K219 Gastro-esophageal reflux disease without esophagitis: Secondary | ICD-10-CM | POA: Diagnosis not present

## 2017-09-26 DIAGNOSIS — D649 Anemia, unspecified: Secondary | ICD-10-CM | POA: Diagnosis not present

## 2017-09-26 DIAGNOSIS — K279 Peptic ulcer, site unspecified, unspecified as acute or chronic, without hemorrhage or perforation: Secondary | ICD-10-CM | POA: Diagnosis not present

## 2017-09-26 DIAGNOSIS — I2510 Atherosclerotic heart disease of native coronary artery without angina pectoris: Secondary | ICD-10-CM | POA: Diagnosis not present

## 2017-09-27 DIAGNOSIS — I2510 Atherosclerotic heart disease of native coronary artery without angina pectoris: Secondary | ICD-10-CM | POA: Diagnosis not present

## 2017-09-27 DIAGNOSIS — M62462 Contracture of muscle, left lower leg: Secondary | ICD-10-CM | POA: Diagnosis not present

## 2017-09-27 DIAGNOSIS — E785 Hyperlipidemia, unspecified: Secondary | ICD-10-CM | POA: Diagnosis not present

## 2017-09-27 DIAGNOSIS — R627 Adult failure to thrive: Secondary | ICD-10-CM | POA: Diagnosis not present

## 2017-09-27 DIAGNOSIS — R296 Repeated falls: Secondary | ICD-10-CM | POA: Diagnosis not present

## 2017-09-27 DIAGNOSIS — Z951 Presence of aortocoronary bypass graft: Secondary | ICD-10-CM | POA: Diagnosis not present

## 2017-09-27 DIAGNOSIS — D649 Anemia, unspecified: Secondary | ICD-10-CM | POA: Diagnosis not present

## 2017-09-27 DIAGNOSIS — Z853 Personal history of malignant neoplasm of breast: Secondary | ICD-10-CM | POA: Diagnosis not present

## 2017-09-27 DIAGNOSIS — M1991 Primary osteoarthritis, unspecified site: Secondary | ICD-10-CM | POA: Diagnosis not present

## 2017-09-27 DIAGNOSIS — K219 Gastro-esophageal reflux disease without esophagitis: Secondary | ICD-10-CM | POA: Diagnosis not present

## 2017-09-27 DIAGNOSIS — E039 Hypothyroidism, unspecified: Secondary | ICD-10-CM | POA: Diagnosis not present

## 2017-09-27 DIAGNOSIS — K279 Peptic ulcer, site unspecified, unspecified as acute or chronic, without hemorrhage or perforation: Secondary | ICD-10-CM | POA: Diagnosis not present

## 2017-09-27 DIAGNOSIS — S72002D Fracture of unspecified part of neck of left femur, subsequent encounter for closed fracture with routine healing: Secondary | ICD-10-CM | POA: Diagnosis not present

## 2017-09-28 DIAGNOSIS — Z853 Personal history of malignant neoplasm of breast: Secondary | ICD-10-CM | POA: Diagnosis not present

## 2017-09-28 DIAGNOSIS — S72002D Fracture of unspecified part of neck of left femur, subsequent encounter for closed fracture with routine healing: Secondary | ICD-10-CM | POA: Diagnosis not present

## 2017-09-28 DIAGNOSIS — M62462 Contracture of muscle, left lower leg: Secondary | ICD-10-CM | POA: Diagnosis not present

## 2017-09-28 DIAGNOSIS — R296 Repeated falls: Secondary | ICD-10-CM | POA: Diagnosis not present

## 2017-09-28 DIAGNOSIS — R627 Adult failure to thrive: Secondary | ICD-10-CM | POA: Diagnosis not present

## 2017-09-28 DIAGNOSIS — K219 Gastro-esophageal reflux disease without esophagitis: Secondary | ICD-10-CM | POA: Diagnosis not present

## 2017-09-28 DIAGNOSIS — M1991 Primary osteoarthritis, unspecified site: Secondary | ICD-10-CM | POA: Diagnosis not present

## 2017-09-28 DIAGNOSIS — D649 Anemia, unspecified: Secondary | ICD-10-CM | POA: Diagnosis not present

## 2017-09-28 DIAGNOSIS — Z951 Presence of aortocoronary bypass graft: Secondary | ICD-10-CM | POA: Diagnosis not present

## 2017-09-28 DIAGNOSIS — K279 Peptic ulcer, site unspecified, unspecified as acute or chronic, without hemorrhage or perforation: Secondary | ICD-10-CM | POA: Diagnosis not present

## 2017-09-28 DIAGNOSIS — I2510 Atherosclerotic heart disease of native coronary artery without angina pectoris: Secondary | ICD-10-CM | POA: Diagnosis not present

## 2017-09-28 DIAGNOSIS — E039 Hypothyroidism, unspecified: Secondary | ICD-10-CM | POA: Diagnosis not present

## 2017-09-28 DIAGNOSIS — E785 Hyperlipidemia, unspecified: Secondary | ICD-10-CM | POA: Diagnosis not present

## 2017-09-29 DIAGNOSIS — R627 Adult failure to thrive: Secondary | ICD-10-CM | POA: Diagnosis not present

## 2017-09-29 DIAGNOSIS — K279 Peptic ulcer, site unspecified, unspecified as acute or chronic, without hemorrhage or perforation: Secondary | ICD-10-CM | POA: Diagnosis not present

## 2017-09-29 DIAGNOSIS — K219 Gastro-esophageal reflux disease without esophagitis: Secondary | ICD-10-CM | POA: Diagnosis not present

## 2017-09-29 DIAGNOSIS — E785 Hyperlipidemia, unspecified: Secondary | ICD-10-CM | POA: Diagnosis not present

## 2017-09-29 DIAGNOSIS — Z951 Presence of aortocoronary bypass graft: Secondary | ICD-10-CM | POA: Diagnosis not present

## 2017-09-29 DIAGNOSIS — I2510 Atherosclerotic heart disease of native coronary artery without angina pectoris: Secondary | ICD-10-CM | POA: Diagnosis not present

## 2017-09-29 DIAGNOSIS — Z853 Personal history of malignant neoplasm of breast: Secondary | ICD-10-CM | POA: Diagnosis not present

## 2017-09-29 DIAGNOSIS — M1991 Primary osteoarthritis, unspecified site: Secondary | ICD-10-CM | POA: Diagnosis not present

## 2017-09-29 DIAGNOSIS — R296 Repeated falls: Secondary | ICD-10-CM | POA: Diagnosis not present

## 2017-09-29 DIAGNOSIS — D649 Anemia, unspecified: Secondary | ICD-10-CM | POA: Diagnosis not present

## 2017-09-29 DIAGNOSIS — S72002D Fracture of unspecified part of neck of left femur, subsequent encounter for closed fracture with routine healing: Secondary | ICD-10-CM | POA: Diagnosis not present

## 2017-09-29 DIAGNOSIS — E039 Hypothyroidism, unspecified: Secondary | ICD-10-CM | POA: Diagnosis not present

## 2017-09-29 DIAGNOSIS — M62462 Contracture of muscle, left lower leg: Secondary | ICD-10-CM | POA: Diagnosis not present

## 2017-09-30 DIAGNOSIS — R627 Adult failure to thrive: Secondary | ICD-10-CM | POA: Diagnosis not present

## 2017-09-30 DIAGNOSIS — E039 Hypothyroidism, unspecified: Secondary | ICD-10-CM | POA: Diagnosis not present

## 2017-09-30 DIAGNOSIS — S72002D Fracture of unspecified part of neck of left femur, subsequent encounter for closed fracture with routine healing: Secondary | ICD-10-CM | POA: Diagnosis not present

## 2017-09-30 DIAGNOSIS — R296 Repeated falls: Secondary | ICD-10-CM | POA: Diagnosis not present

## 2017-09-30 DIAGNOSIS — K219 Gastro-esophageal reflux disease without esophagitis: Secondary | ICD-10-CM | POA: Diagnosis not present

## 2017-09-30 DIAGNOSIS — Z853 Personal history of malignant neoplasm of breast: Secondary | ICD-10-CM | POA: Diagnosis not present

## 2017-09-30 DIAGNOSIS — I2510 Atherosclerotic heart disease of native coronary artery without angina pectoris: Secondary | ICD-10-CM | POA: Diagnosis not present

## 2017-09-30 DIAGNOSIS — K279 Peptic ulcer, site unspecified, unspecified as acute or chronic, without hemorrhage or perforation: Secondary | ICD-10-CM | POA: Diagnosis not present

## 2017-09-30 DIAGNOSIS — M62462 Contracture of muscle, left lower leg: Secondary | ICD-10-CM | POA: Diagnosis not present

## 2017-09-30 DIAGNOSIS — M1991 Primary osteoarthritis, unspecified site: Secondary | ICD-10-CM | POA: Diagnosis not present

## 2017-09-30 DIAGNOSIS — E785 Hyperlipidemia, unspecified: Secondary | ICD-10-CM | POA: Diagnosis not present

## 2017-09-30 DIAGNOSIS — D649 Anemia, unspecified: Secondary | ICD-10-CM | POA: Diagnosis not present

## 2017-09-30 DIAGNOSIS — Z951 Presence of aortocoronary bypass graft: Secondary | ICD-10-CM | POA: Diagnosis not present

## 2017-10-01 DIAGNOSIS — K219 Gastro-esophageal reflux disease without esophagitis: Secondary | ICD-10-CM | POA: Diagnosis not present

## 2017-10-01 DIAGNOSIS — K279 Peptic ulcer, site unspecified, unspecified as acute or chronic, without hemorrhage or perforation: Secondary | ICD-10-CM | POA: Diagnosis not present

## 2017-10-01 DIAGNOSIS — E785 Hyperlipidemia, unspecified: Secondary | ICD-10-CM | POA: Diagnosis not present

## 2017-10-01 DIAGNOSIS — I2510 Atherosclerotic heart disease of native coronary artery without angina pectoris: Secondary | ICD-10-CM | POA: Diagnosis not present

## 2017-10-01 DIAGNOSIS — D649 Anemia, unspecified: Secondary | ICD-10-CM | POA: Diagnosis not present

## 2017-10-01 DIAGNOSIS — Z853 Personal history of malignant neoplasm of breast: Secondary | ICD-10-CM | POA: Diagnosis not present

## 2017-10-01 DIAGNOSIS — Z951 Presence of aortocoronary bypass graft: Secondary | ICD-10-CM | POA: Diagnosis not present

## 2017-10-01 DIAGNOSIS — M1991 Primary osteoarthritis, unspecified site: Secondary | ICD-10-CM | POA: Diagnosis not present

## 2017-10-01 DIAGNOSIS — R627 Adult failure to thrive: Secondary | ICD-10-CM | POA: Diagnosis not present

## 2017-10-01 DIAGNOSIS — E039 Hypothyroidism, unspecified: Secondary | ICD-10-CM | POA: Diagnosis not present

## 2017-10-01 DIAGNOSIS — M62462 Contracture of muscle, left lower leg: Secondary | ICD-10-CM | POA: Diagnosis not present

## 2017-10-01 DIAGNOSIS — R296 Repeated falls: Secondary | ICD-10-CM | POA: Diagnosis not present

## 2017-10-01 DIAGNOSIS — S72002D Fracture of unspecified part of neck of left femur, subsequent encounter for closed fracture with routine healing: Secondary | ICD-10-CM | POA: Diagnosis not present

## 2017-10-02 DIAGNOSIS — E785 Hyperlipidemia, unspecified: Secondary | ICD-10-CM | POA: Diagnosis not present

## 2017-10-02 DIAGNOSIS — I2510 Atherosclerotic heart disease of native coronary artery without angina pectoris: Secondary | ICD-10-CM | POA: Diagnosis not present

## 2017-10-02 DIAGNOSIS — R627 Adult failure to thrive: Secondary | ICD-10-CM | POA: Diagnosis not present

## 2017-10-02 DIAGNOSIS — S72002D Fracture of unspecified part of neck of left femur, subsequent encounter for closed fracture with routine healing: Secondary | ICD-10-CM | POA: Diagnosis not present

## 2017-10-02 DIAGNOSIS — Z853 Personal history of malignant neoplasm of breast: Secondary | ICD-10-CM | POA: Diagnosis not present

## 2017-10-02 DIAGNOSIS — E039 Hypothyroidism, unspecified: Secondary | ICD-10-CM | POA: Diagnosis not present

## 2017-10-02 DIAGNOSIS — R296 Repeated falls: Secondary | ICD-10-CM | POA: Diagnosis not present

## 2017-10-02 DIAGNOSIS — Z951 Presence of aortocoronary bypass graft: Secondary | ICD-10-CM | POA: Diagnosis not present

## 2017-10-02 DIAGNOSIS — M1991 Primary osteoarthritis, unspecified site: Secondary | ICD-10-CM | POA: Diagnosis not present

## 2017-10-02 DIAGNOSIS — D649 Anemia, unspecified: Secondary | ICD-10-CM | POA: Diagnosis not present

## 2017-10-02 DIAGNOSIS — K219 Gastro-esophageal reflux disease without esophagitis: Secondary | ICD-10-CM | POA: Diagnosis not present

## 2017-10-02 DIAGNOSIS — K279 Peptic ulcer, site unspecified, unspecified as acute or chronic, without hemorrhage or perforation: Secondary | ICD-10-CM | POA: Diagnosis not present

## 2017-10-02 DIAGNOSIS — M62462 Contracture of muscle, left lower leg: Secondary | ICD-10-CM | POA: Diagnosis not present

## 2017-10-03 DIAGNOSIS — I2510 Atherosclerotic heart disease of native coronary artery without angina pectoris: Secondary | ICD-10-CM | POA: Diagnosis not present

## 2017-10-03 DIAGNOSIS — E039 Hypothyroidism, unspecified: Secondary | ICD-10-CM | POA: Diagnosis not present

## 2017-10-03 DIAGNOSIS — S72002D Fracture of unspecified part of neck of left femur, subsequent encounter for closed fracture with routine healing: Secondary | ICD-10-CM | POA: Diagnosis not present

## 2017-10-03 DIAGNOSIS — K219 Gastro-esophageal reflux disease without esophagitis: Secondary | ICD-10-CM | POA: Diagnosis not present

## 2017-10-03 DIAGNOSIS — D649 Anemia, unspecified: Secondary | ICD-10-CM | POA: Diagnosis not present

## 2017-10-03 DIAGNOSIS — M1991 Primary osteoarthritis, unspecified site: Secondary | ICD-10-CM | POA: Diagnosis not present

## 2017-10-03 DIAGNOSIS — M62462 Contracture of muscle, left lower leg: Secondary | ICD-10-CM | POA: Diagnosis not present

## 2017-10-03 DIAGNOSIS — E785 Hyperlipidemia, unspecified: Secondary | ICD-10-CM | POA: Diagnosis not present

## 2017-10-03 DIAGNOSIS — Z951 Presence of aortocoronary bypass graft: Secondary | ICD-10-CM | POA: Diagnosis not present

## 2017-10-03 DIAGNOSIS — K279 Peptic ulcer, site unspecified, unspecified as acute or chronic, without hemorrhage or perforation: Secondary | ICD-10-CM | POA: Diagnosis not present

## 2017-10-03 DIAGNOSIS — R627 Adult failure to thrive: Secondary | ICD-10-CM | POA: Diagnosis not present

## 2017-10-03 DIAGNOSIS — Z853 Personal history of malignant neoplasm of breast: Secondary | ICD-10-CM | POA: Diagnosis not present

## 2017-10-03 DIAGNOSIS — R296 Repeated falls: Secondary | ICD-10-CM | POA: Diagnosis not present

## 2017-10-04 DIAGNOSIS — R627 Adult failure to thrive: Secondary | ICD-10-CM | POA: Diagnosis not present

## 2017-10-04 DIAGNOSIS — M1991 Primary osteoarthritis, unspecified site: Secondary | ICD-10-CM | POA: Diagnosis not present

## 2017-10-04 DIAGNOSIS — K279 Peptic ulcer, site unspecified, unspecified as acute or chronic, without hemorrhage or perforation: Secondary | ICD-10-CM | POA: Diagnosis not present

## 2017-10-04 DIAGNOSIS — S72002D Fracture of unspecified part of neck of left femur, subsequent encounter for closed fracture with routine healing: Secondary | ICD-10-CM | POA: Diagnosis not present

## 2017-10-04 DIAGNOSIS — Z853 Personal history of malignant neoplasm of breast: Secondary | ICD-10-CM | POA: Diagnosis not present

## 2017-10-04 DIAGNOSIS — D649 Anemia, unspecified: Secondary | ICD-10-CM | POA: Diagnosis not present

## 2017-10-04 DIAGNOSIS — K219 Gastro-esophageal reflux disease without esophagitis: Secondary | ICD-10-CM | POA: Diagnosis not present

## 2017-10-04 DIAGNOSIS — E785 Hyperlipidemia, unspecified: Secondary | ICD-10-CM | POA: Diagnosis not present

## 2017-10-04 DIAGNOSIS — Z951 Presence of aortocoronary bypass graft: Secondary | ICD-10-CM | POA: Diagnosis not present

## 2017-10-04 DIAGNOSIS — R296 Repeated falls: Secondary | ICD-10-CM | POA: Diagnosis not present

## 2017-10-04 DIAGNOSIS — E039 Hypothyroidism, unspecified: Secondary | ICD-10-CM | POA: Diagnosis not present

## 2017-10-04 DIAGNOSIS — M62462 Contracture of muscle, left lower leg: Secondary | ICD-10-CM | POA: Diagnosis not present

## 2017-10-04 DIAGNOSIS — I2510 Atherosclerotic heart disease of native coronary artery without angina pectoris: Secondary | ICD-10-CM | POA: Diagnosis not present

## 2017-10-05 DIAGNOSIS — D649 Anemia, unspecified: Secondary | ICD-10-CM | POA: Diagnosis not present

## 2017-10-05 DIAGNOSIS — I2510 Atherosclerotic heart disease of native coronary artery without angina pectoris: Secondary | ICD-10-CM | POA: Diagnosis not present

## 2017-10-05 DIAGNOSIS — S72002D Fracture of unspecified part of neck of left femur, subsequent encounter for closed fracture with routine healing: Secondary | ICD-10-CM | POA: Diagnosis not present

## 2017-10-05 DIAGNOSIS — R627 Adult failure to thrive: Secondary | ICD-10-CM | POA: Diagnosis not present

## 2017-10-05 DIAGNOSIS — Z951 Presence of aortocoronary bypass graft: Secondary | ICD-10-CM | POA: Diagnosis not present

## 2017-10-05 DIAGNOSIS — Z853 Personal history of malignant neoplasm of breast: Secondary | ICD-10-CM | POA: Diagnosis not present

## 2017-10-05 DIAGNOSIS — E785 Hyperlipidemia, unspecified: Secondary | ICD-10-CM | POA: Diagnosis not present

## 2017-10-05 DIAGNOSIS — M1991 Primary osteoarthritis, unspecified site: Secondary | ICD-10-CM | POA: Diagnosis not present

## 2017-10-05 DIAGNOSIS — R296 Repeated falls: Secondary | ICD-10-CM | POA: Diagnosis not present

## 2017-10-05 DIAGNOSIS — K279 Peptic ulcer, site unspecified, unspecified as acute or chronic, without hemorrhage or perforation: Secondary | ICD-10-CM | POA: Diagnosis not present

## 2017-10-05 DIAGNOSIS — E039 Hypothyroidism, unspecified: Secondary | ICD-10-CM | POA: Diagnosis not present

## 2017-10-05 DIAGNOSIS — K219 Gastro-esophageal reflux disease without esophagitis: Secondary | ICD-10-CM | POA: Diagnosis not present

## 2017-10-05 DIAGNOSIS — M62462 Contracture of muscle, left lower leg: Secondary | ICD-10-CM | POA: Diagnosis not present

## 2017-10-06 DIAGNOSIS — R627 Adult failure to thrive: Secondary | ICD-10-CM | POA: Diagnosis not present

## 2017-10-06 DIAGNOSIS — E039 Hypothyroidism, unspecified: Secondary | ICD-10-CM | POA: Diagnosis not present

## 2017-10-06 DIAGNOSIS — K219 Gastro-esophageal reflux disease without esophagitis: Secondary | ICD-10-CM | POA: Diagnosis not present

## 2017-10-06 DIAGNOSIS — M62462 Contracture of muscle, left lower leg: Secondary | ICD-10-CM | POA: Diagnosis not present

## 2017-10-06 DIAGNOSIS — M1991 Primary osteoarthritis, unspecified site: Secondary | ICD-10-CM | POA: Diagnosis not present

## 2017-10-06 DIAGNOSIS — I2510 Atherosclerotic heart disease of native coronary artery without angina pectoris: Secondary | ICD-10-CM | POA: Diagnosis not present

## 2017-10-06 DIAGNOSIS — Z951 Presence of aortocoronary bypass graft: Secondary | ICD-10-CM | POA: Diagnosis not present

## 2017-10-06 DIAGNOSIS — Z853 Personal history of malignant neoplasm of breast: Secondary | ICD-10-CM | POA: Diagnosis not present

## 2017-10-06 DIAGNOSIS — K279 Peptic ulcer, site unspecified, unspecified as acute or chronic, without hemorrhage or perforation: Secondary | ICD-10-CM | POA: Diagnosis not present

## 2017-10-06 DIAGNOSIS — R296 Repeated falls: Secondary | ICD-10-CM | POA: Diagnosis not present

## 2017-10-06 DIAGNOSIS — D649 Anemia, unspecified: Secondary | ICD-10-CM | POA: Diagnosis not present

## 2017-10-06 DIAGNOSIS — S72002D Fracture of unspecified part of neck of left femur, subsequent encounter for closed fracture with routine healing: Secondary | ICD-10-CM | POA: Diagnosis not present

## 2017-10-06 DIAGNOSIS — E785 Hyperlipidemia, unspecified: Secondary | ICD-10-CM | POA: Diagnosis not present

## 2017-10-07 DIAGNOSIS — E785 Hyperlipidemia, unspecified: Secondary | ICD-10-CM | POA: Diagnosis not present

## 2017-10-07 DIAGNOSIS — M62462 Contracture of muscle, left lower leg: Secondary | ICD-10-CM | POA: Diagnosis not present

## 2017-10-07 DIAGNOSIS — Z951 Presence of aortocoronary bypass graft: Secondary | ICD-10-CM | POA: Diagnosis not present

## 2017-10-07 DIAGNOSIS — I2510 Atherosclerotic heart disease of native coronary artery without angina pectoris: Secondary | ICD-10-CM | POA: Diagnosis not present

## 2017-10-07 DIAGNOSIS — S72002D Fracture of unspecified part of neck of left femur, subsequent encounter for closed fracture with routine healing: Secondary | ICD-10-CM | POA: Diagnosis not present

## 2017-10-07 DIAGNOSIS — E039 Hypothyroidism, unspecified: Secondary | ICD-10-CM | POA: Diagnosis not present

## 2017-10-07 DIAGNOSIS — R627 Adult failure to thrive: Secondary | ICD-10-CM | POA: Diagnosis not present

## 2017-10-07 DIAGNOSIS — R296 Repeated falls: Secondary | ICD-10-CM | POA: Diagnosis not present

## 2017-10-07 DIAGNOSIS — M1991 Primary osteoarthritis, unspecified site: Secondary | ICD-10-CM | POA: Diagnosis not present

## 2017-10-07 DIAGNOSIS — K219 Gastro-esophageal reflux disease without esophagitis: Secondary | ICD-10-CM | POA: Diagnosis not present

## 2017-10-07 DIAGNOSIS — Z853 Personal history of malignant neoplasm of breast: Secondary | ICD-10-CM | POA: Diagnosis not present

## 2017-10-07 DIAGNOSIS — D649 Anemia, unspecified: Secondary | ICD-10-CM | POA: Diagnosis not present

## 2017-10-07 DIAGNOSIS — K279 Peptic ulcer, site unspecified, unspecified as acute or chronic, without hemorrhage or perforation: Secondary | ICD-10-CM | POA: Diagnosis not present

## 2017-10-08 DIAGNOSIS — M62462 Contracture of muscle, left lower leg: Secondary | ICD-10-CM | POA: Diagnosis not present

## 2017-10-08 DIAGNOSIS — Z853 Personal history of malignant neoplasm of breast: Secondary | ICD-10-CM | POA: Diagnosis not present

## 2017-10-08 DIAGNOSIS — S72002D Fracture of unspecified part of neck of left femur, subsequent encounter for closed fracture with routine healing: Secondary | ICD-10-CM | POA: Diagnosis not present

## 2017-10-08 DIAGNOSIS — R296 Repeated falls: Secondary | ICD-10-CM | POA: Diagnosis not present

## 2017-10-08 DIAGNOSIS — E785 Hyperlipidemia, unspecified: Secondary | ICD-10-CM | POA: Diagnosis not present

## 2017-10-08 DIAGNOSIS — M1991 Primary osteoarthritis, unspecified site: Secondary | ICD-10-CM | POA: Diagnosis not present

## 2017-10-08 DIAGNOSIS — E039 Hypothyroidism, unspecified: Secondary | ICD-10-CM | POA: Diagnosis not present

## 2017-10-08 DIAGNOSIS — R627 Adult failure to thrive: Secondary | ICD-10-CM | POA: Diagnosis not present

## 2017-10-08 DIAGNOSIS — K279 Peptic ulcer, site unspecified, unspecified as acute or chronic, without hemorrhage or perforation: Secondary | ICD-10-CM | POA: Diagnosis not present

## 2017-10-08 DIAGNOSIS — K219 Gastro-esophageal reflux disease without esophagitis: Secondary | ICD-10-CM | POA: Diagnosis not present

## 2017-10-08 DIAGNOSIS — I2510 Atherosclerotic heart disease of native coronary artery without angina pectoris: Secondary | ICD-10-CM | POA: Diagnosis not present

## 2017-10-08 DIAGNOSIS — D649 Anemia, unspecified: Secondary | ICD-10-CM | POA: Diagnosis not present

## 2017-10-08 DIAGNOSIS — Z951 Presence of aortocoronary bypass graft: Secondary | ICD-10-CM | POA: Diagnosis not present

## 2017-10-09 DIAGNOSIS — K279 Peptic ulcer, site unspecified, unspecified as acute or chronic, without hemorrhage or perforation: Secondary | ICD-10-CM | POA: Diagnosis not present

## 2017-10-09 DIAGNOSIS — M1991 Primary osteoarthritis, unspecified site: Secondary | ICD-10-CM | POA: Diagnosis not present

## 2017-10-09 DIAGNOSIS — M62462 Contracture of muscle, left lower leg: Secondary | ICD-10-CM | POA: Diagnosis not present

## 2017-10-09 DIAGNOSIS — D649 Anemia, unspecified: Secondary | ICD-10-CM | POA: Diagnosis not present

## 2017-10-09 DIAGNOSIS — K219 Gastro-esophageal reflux disease without esophagitis: Secondary | ICD-10-CM | POA: Diagnosis not present

## 2017-10-09 DIAGNOSIS — Z951 Presence of aortocoronary bypass graft: Secondary | ICD-10-CM | POA: Diagnosis not present

## 2017-10-09 DIAGNOSIS — Z853 Personal history of malignant neoplasm of breast: Secondary | ICD-10-CM | POA: Diagnosis not present

## 2017-10-09 DIAGNOSIS — R296 Repeated falls: Secondary | ICD-10-CM | POA: Diagnosis not present

## 2017-10-09 DIAGNOSIS — E039 Hypothyroidism, unspecified: Secondary | ICD-10-CM | POA: Diagnosis not present

## 2017-10-09 DIAGNOSIS — E785 Hyperlipidemia, unspecified: Secondary | ICD-10-CM | POA: Diagnosis not present

## 2017-10-09 DIAGNOSIS — I2510 Atherosclerotic heart disease of native coronary artery without angina pectoris: Secondary | ICD-10-CM | POA: Diagnosis not present

## 2017-10-09 DIAGNOSIS — R627 Adult failure to thrive: Secondary | ICD-10-CM | POA: Diagnosis not present

## 2017-10-09 DIAGNOSIS — S72002D Fracture of unspecified part of neck of left femur, subsequent encounter for closed fracture with routine healing: Secondary | ICD-10-CM | POA: Diagnosis not present

## 2017-10-10 DIAGNOSIS — Z853 Personal history of malignant neoplasm of breast: Secondary | ICD-10-CM | POA: Diagnosis not present

## 2017-10-10 DIAGNOSIS — S72002D Fracture of unspecified part of neck of left femur, subsequent encounter for closed fracture with routine healing: Secondary | ICD-10-CM | POA: Diagnosis not present

## 2017-10-10 DIAGNOSIS — R296 Repeated falls: Secondary | ICD-10-CM | POA: Diagnosis not present

## 2017-10-10 DIAGNOSIS — M1991 Primary osteoarthritis, unspecified site: Secondary | ICD-10-CM | POA: Diagnosis not present

## 2017-10-10 DIAGNOSIS — K279 Peptic ulcer, site unspecified, unspecified as acute or chronic, without hemorrhage or perforation: Secondary | ICD-10-CM | POA: Diagnosis not present

## 2017-10-10 DIAGNOSIS — I2510 Atherosclerotic heart disease of native coronary artery without angina pectoris: Secondary | ICD-10-CM | POA: Diagnosis not present

## 2017-10-10 DIAGNOSIS — E785 Hyperlipidemia, unspecified: Secondary | ICD-10-CM | POA: Diagnosis not present

## 2017-10-10 DIAGNOSIS — R627 Adult failure to thrive: Secondary | ICD-10-CM | POA: Diagnosis not present

## 2017-10-10 DIAGNOSIS — D649 Anemia, unspecified: Secondary | ICD-10-CM | POA: Diagnosis not present

## 2017-10-10 DIAGNOSIS — E039 Hypothyroidism, unspecified: Secondary | ICD-10-CM | POA: Diagnosis not present

## 2017-10-10 DIAGNOSIS — M62462 Contracture of muscle, left lower leg: Secondary | ICD-10-CM | POA: Diagnosis not present

## 2017-10-10 DIAGNOSIS — K219 Gastro-esophageal reflux disease without esophagitis: Secondary | ICD-10-CM | POA: Diagnosis not present

## 2017-10-10 DIAGNOSIS — Z951 Presence of aortocoronary bypass graft: Secondary | ICD-10-CM | POA: Diagnosis not present

## 2017-10-11 DIAGNOSIS — R627 Adult failure to thrive: Secondary | ICD-10-CM | POA: Diagnosis not present

## 2017-10-11 DIAGNOSIS — M1991 Primary osteoarthritis, unspecified site: Secondary | ICD-10-CM | POA: Diagnosis not present

## 2017-10-11 DIAGNOSIS — I2510 Atherosclerotic heart disease of native coronary artery without angina pectoris: Secondary | ICD-10-CM | POA: Diagnosis not present

## 2017-10-11 DIAGNOSIS — K279 Peptic ulcer, site unspecified, unspecified as acute or chronic, without hemorrhage or perforation: Secondary | ICD-10-CM | POA: Diagnosis not present

## 2017-10-11 DIAGNOSIS — K219 Gastro-esophageal reflux disease without esophagitis: Secondary | ICD-10-CM | POA: Diagnosis not present

## 2017-10-11 DIAGNOSIS — E039 Hypothyroidism, unspecified: Secondary | ICD-10-CM | POA: Diagnosis not present

## 2017-10-11 DIAGNOSIS — M62462 Contracture of muscle, left lower leg: Secondary | ICD-10-CM | POA: Diagnosis not present

## 2017-10-11 DIAGNOSIS — R296 Repeated falls: Secondary | ICD-10-CM | POA: Diagnosis not present

## 2017-10-11 DIAGNOSIS — Z951 Presence of aortocoronary bypass graft: Secondary | ICD-10-CM | POA: Diagnosis not present

## 2017-10-11 DIAGNOSIS — D649 Anemia, unspecified: Secondary | ICD-10-CM | POA: Diagnosis not present

## 2017-10-11 DIAGNOSIS — S72002D Fracture of unspecified part of neck of left femur, subsequent encounter for closed fracture with routine healing: Secondary | ICD-10-CM | POA: Diagnosis not present

## 2017-10-11 DIAGNOSIS — Z853 Personal history of malignant neoplasm of breast: Secondary | ICD-10-CM | POA: Diagnosis not present

## 2017-10-11 DIAGNOSIS — E785 Hyperlipidemia, unspecified: Secondary | ICD-10-CM | POA: Diagnosis not present

## 2017-10-12 DIAGNOSIS — E039 Hypothyroidism, unspecified: Secondary | ICD-10-CM | POA: Diagnosis not present

## 2017-10-12 DIAGNOSIS — Z853 Personal history of malignant neoplasm of breast: Secondary | ICD-10-CM | POA: Diagnosis not present

## 2017-10-12 DIAGNOSIS — K219 Gastro-esophageal reflux disease without esophagitis: Secondary | ICD-10-CM | POA: Diagnosis not present

## 2017-10-12 DIAGNOSIS — R627 Adult failure to thrive: Secondary | ICD-10-CM | POA: Diagnosis not present

## 2017-10-12 DIAGNOSIS — S72002D Fracture of unspecified part of neck of left femur, subsequent encounter for closed fracture with routine healing: Secondary | ICD-10-CM | POA: Diagnosis not present

## 2017-10-12 DIAGNOSIS — M1991 Primary osteoarthritis, unspecified site: Secondary | ICD-10-CM | POA: Diagnosis not present

## 2017-10-12 DIAGNOSIS — D649 Anemia, unspecified: Secondary | ICD-10-CM | POA: Diagnosis not present

## 2017-10-12 DIAGNOSIS — E785 Hyperlipidemia, unspecified: Secondary | ICD-10-CM | POA: Diagnosis not present

## 2017-10-12 DIAGNOSIS — M62462 Contracture of muscle, left lower leg: Secondary | ICD-10-CM | POA: Diagnosis not present

## 2017-10-12 DIAGNOSIS — K279 Peptic ulcer, site unspecified, unspecified as acute or chronic, without hemorrhage or perforation: Secondary | ICD-10-CM | POA: Diagnosis not present

## 2017-10-12 DIAGNOSIS — R296 Repeated falls: Secondary | ICD-10-CM | POA: Diagnosis not present

## 2017-10-12 DIAGNOSIS — Z951 Presence of aortocoronary bypass graft: Secondary | ICD-10-CM | POA: Diagnosis not present

## 2017-10-12 DIAGNOSIS — I2510 Atherosclerotic heart disease of native coronary artery without angina pectoris: Secondary | ICD-10-CM | POA: Diagnosis not present

## 2017-10-13 DIAGNOSIS — Z951 Presence of aortocoronary bypass graft: Secondary | ICD-10-CM | POA: Diagnosis not present

## 2017-10-13 DIAGNOSIS — K219 Gastro-esophageal reflux disease without esophagitis: Secondary | ICD-10-CM | POA: Diagnosis not present

## 2017-10-13 DIAGNOSIS — R296 Repeated falls: Secondary | ICD-10-CM | POA: Diagnosis not present

## 2017-10-13 DIAGNOSIS — M1991 Primary osteoarthritis, unspecified site: Secondary | ICD-10-CM | POA: Diagnosis not present

## 2017-10-13 DIAGNOSIS — R627 Adult failure to thrive: Secondary | ICD-10-CM | POA: Diagnosis not present

## 2017-10-13 DIAGNOSIS — E039 Hypothyroidism, unspecified: Secondary | ICD-10-CM | POA: Diagnosis not present

## 2017-10-13 DIAGNOSIS — S72002D Fracture of unspecified part of neck of left femur, subsequent encounter for closed fracture with routine healing: Secondary | ICD-10-CM | POA: Diagnosis not present

## 2017-10-13 DIAGNOSIS — E785 Hyperlipidemia, unspecified: Secondary | ICD-10-CM | POA: Diagnosis not present

## 2017-10-13 DIAGNOSIS — I2510 Atherosclerotic heart disease of native coronary artery without angina pectoris: Secondary | ICD-10-CM | POA: Diagnosis not present

## 2017-10-13 DIAGNOSIS — K279 Peptic ulcer, site unspecified, unspecified as acute or chronic, without hemorrhage or perforation: Secondary | ICD-10-CM | POA: Diagnosis not present

## 2017-10-13 DIAGNOSIS — M62462 Contracture of muscle, left lower leg: Secondary | ICD-10-CM | POA: Diagnosis not present

## 2017-10-13 DIAGNOSIS — Z853 Personal history of malignant neoplasm of breast: Secondary | ICD-10-CM | POA: Diagnosis not present

## 2017-10-13 DIAGNOSIS — D649 Anemia, unspecified: Secondary | ICD-10-CM | POA: Diagnosis not present

## 2017-10-14 DIAGNOSIS — Z853 Personal history of malignant neoplasm of breast: Secondary | ICD-10-CM | POA: Diagnosis not present

## 2017-10-14 DIAGNOSIS — R296 Repeated falls: Secondary | ICD-10-CM | POA: Diagnosis not present

## 2017-10-14 DIAGNOSIS — M1991 Primary osteoarthritis, unspecified site: Secondary | ICD-10-CM | POA: Diagnosis not present

## 2017-10-14 DIAGNOSIS — Z951 Presence of aortocoronary bypass graft: Secondary | ICD-10-CM | POA: Diagnosis not present

## 2017-10-14 DIAGNOSIS — E039 Hypothyroidism, unspecified: Secondary | ICD-10-CM | POA: Diagnosis not present

## 2017-10-14 DIAGNOSIS — D649 Anemia, unspecified: Secondary | ICD-10-CM | POA: Diagnosis not present

## 2017-10-14 DIAGNOSIS — I2510 Atherosclerotic heart disease of native coronary artery without angina pectoris: Secondary | ICD-10-CM | POA: Diagnosis not present

## 2017-10-14 DIAGNOSIS — K279 Peptic ulcer, site unspecified, unspecified as acute or chronic, without hemorrhage or perforation: Secondary | ICD-10-CM | POA: Diagnosis not present

## 2017-10-14 DIAGNOSIS — S72002D Fracture of unspecified part of neck of left femur, subsequent encounter for closed fracture with routine healing: Secondary | ICD-10-CM | POA: Diagnosis not present

## 2017-10-14 DIAGNOSIS — K219 Gastro-esophageal reflux disease without esophagitis: Secondary | ICD-10-CM | POA: Diagnosis not present

## 2017-10-14 DIAGNOSIS — R627 Adult failure to thrive: Secondary | ICD-10-CM | POA: Diagnosis not present

## 2017-10-14 DIAGNOSIS — E785 Hyperlipidemia, unspecified: Secondary | ICD-10-CM | POA: Diagnosis not present

## 2017-10-14 DIAGNOSIS — M62462 Contracture of muscle, left lower leg: Secondary | ICD-10-CM | POA: Diagnosis not present

## 2017-10-15 DIAGNOSIS — M1991 Primary osteoarthritis, unspecified site: Secondary | ICD-10-CM | POA: Diagnosis not present

## 2017-10-15 DIAGNOSIS — K279 Peptic ulcer, site unspecified, unspecified as acute or chronic, without hemorrhage or perforation: Secondary | ICD-10-CM | POA: Diagnosis not present

## 2017-10-15 DIAGNOSIS — K219 Gastro-esophageal reflux disease without esophagitis: Secondary | ICD-10-CM | POA: Diagnosis not present

## 2017-10-15 DIAGNOSIS — R627 Adult failure to thrive: Secondary | ICD-10-CM | POA: Diagnosis not present

## 2017-10-15 DIAGNOSIS — E039 Hypothyroidism, unspecified: Secondary | ICD-10-CM | POA: Diagnosis not present

## 2017-10-15 DIAGNOSIS — Z951 Presence of aortocoronary bypass graft: Secondary | ICD-10-CM | POA: Diagnosis not present

## 2017-10-15 DIAGNOSIS — I2510 Atherosclerotic heart disease of native coronary artery without angina pectoris: Secondary | ICD-10-CM | POA: Diagnosis not present

## 2017-10-15 DIAGNOSIS — M62462 Contracture of muscle, left lower leg: Secondary | ICD-10-CM | POA: Diagnosis not present

## 2017-10-15 DIAGNOSIS — E785 Hyperlipidemia, unspecified: Secondary | ICD-10-CM | POA: Diagnosis not present

## 2017-10-15 DIAGNOSIS — R296 Repeated falls: Secondary | ICD-10-CM | POA: Diagnosis not present

## 2017-10-15 DIAGNOSIS — D649 Anemia, unspecified: Secondary | ICD-10-CM | POA: Diagnosis not present

## 2017-10-15 DIAGNOSIS — Z853 Personal history of malignant neoplasm of breast: Secondary | ICD-10-CM | POA: Diagnosis not present

## 2017-10-15 DIAGNOSIS — S72002D Fracture of unspecified part of neck of left femur, subsequent encounter for closed fracture with routine healing: Secondary | ICD-10-CM | POA: Diagnosis not present

## 2017-10-16 DIAGNOSIS — Z951 Presence of aortocoronary bypass graft: Secondary | ICD-10-CM | POA: Diagnosis not present

## 2017-10-16 DIAGNOSIS — I2510 Atherosclerotic heart disease of native coronary artery without angina pectoris: Secondary | ICD-10-CM | POA: Diagnosis not present

## 2017-10-16 DIAGNOSIS — M1991 Primary osteoarthritis, unspecified site: Secondary | ICD-10-CM | POA: Diagnosis not present

## 2017-10-16 DIAGNOSIS — E785 Hyperlipidemia, unspecified: Secondary | ICD-10-CM | POA: Diagnosis not present

## 2017-10-16 DIAGNOSIS — D649 Anemia, unspecified: Secondary | ICD-10-CM | POA: Diagnosis not present

## 2017-10-16 DIAGNOSIS — K219 Gastro-esophageal reflux disease without esophagitis: Secondary | ICD-10-CM | POA: Diagnosis not present

## 2017-10-16 DIAGNOSIS — R627 Adult failure to thrive: Secondary | ICD-10-CM | POA: Diagnosis not present

## 2017-10-16 DIAGNOSIS — Z853 Personal history of malignant neoplasm of breast: Secondary | ICD-10-CM | POA: Diagnosis not present

## 2017-10-16 DIAGNOSIS — R296 Repeated falls: Secondary | ICD-10-CM | POA: Diagnosis not present

## 2017-10-16 DIAGNOSIS — M62462 Contracture of muscle, left lower leg: Secondary | ICD-10-CM | POA: Diagnosis not present

## 2017-10-16 DIAGNOSIS — K279 Peptic ulcer, site unspecified, unspecified as acute or chronic, without hemorrhage or perforation: Secondary | ICD-10-CM | POA: Diagnosis not present

## 2017-10-16 DIAGNOSIS — S72002D Fracture of unspecified part of neck of left femur, subsequent encounter for closed fracture with routine healing: Secondary | ICD-10-CM | POA: Diagnosis not present

## 2017-10-16 DIAGNOSIS — E039 Hypothyroidism, unspecified: Secondary | ICD-10-CM | POA: Diagnosis not present

## 2017-10-17 DIAGNOSIS — M62462 Contracture of muscle, left lower leg: Secondary | ICD-10-CM | POA: Diagnosis not present

## 2017-10-17 DIAGNOSIS — E039 Hypothyroidism, unspecified: Secondary | ICD-10-CM | POA: Diagnosis not present

## 2017-10-17 DIAGNOSIS — R627 Adult failure to thrive: Secondary | ICD-10-CM | POA: Diagnosis not present

## 2017-10-17 DIAGNOSIS — I2510 Atherosclerotic heart disease of native coronary artery without angina pectoris: Secondary | ICD-10-CM | POA: Diagnosis not present

## 2017-10-17 DIAGNOSIS — Z951 Presence of aortocoronary bypass graft: Secondary | ICD-10-CM | POA: Diagnosis not present

## 2017-10-17 DIAGNOSIS — R296 Repeated falls: Secondary | ICD-10-CM | POA: Diagnosis not present

## 2017-10-17 DIAGNOSIS — E785 Hyperlipidemia, unspecified: Secondary | ICD-10-CM | POA: Diagnosis not present

## 2017-10-17 DIAGNOSIS — K219 Gastro-esophageal reflux disease without esophagitis: Secondary | ICD-10-CM | POA: Diagnosis not present

## 2017-10-17 DIAGNOSIS — S72002D Fracture of unspecified part of neck of left femur, subsequent encounter for closed fracture with routine healing: Secondary | ICD-10-CM | POA: Diagnosis not present

## 2017-10-17 DIAGNOSIS — M1991 Primary osteoarthritis, unspecified site: Secondary | ICD-10-CM | POA: Diagnosis not present

## 2017-10-17 DIAGNOSIS — D649 Anemia, unspecified: Secondary | ICD-10-CM | POA: Diagnosis not present

## 2017-10-17 DIAGNOSIS — Z853 Personal history of malignant neoplasm of breast: Secondary | ICD-10-CM | POA: Diagnosis not present

## 2017-10-17 DIAGNOSIS — K279 Peptic ulcer, site unspecified, unspecified as acute or chronic, without hemorrhage or perforation: Secondary | ICD-10-CM | POA: Diagnosis not present

## 2017-10-18 DIAGNOSIS — M62462 Contracture of muscle, left lower leg: Secondary | ICD-10-CM | POA: Diagnosis not present

## 2017-10-18 DIAGNOSIS — E039 Hypothyroidism, unspecified: Secondary | ICD-10-CM | POA: Diagnosis not present

## 2017-10-18 DIAGNOSIS — S72002D Fracture of unspecified part of neck of left femur, subsequent encounter for closed fracture with routine healing: Secondary | ICD-10-CM | POA: Diagnosis not present

## 2017-10-18 DIAGNOSIS — D649 Anemia, unspecified: Secondary | ICD-10-CM | POA: Diagnosis not present

## 2017-10-18 DIAGNOSIS — K219 Gastro-esophageal reflux disease without esophagitis: Secondary | ICD-10-CM | POA: Diagnosis not present

## 2017-10-18 DIAGNOSIS — Z951 Presence of aortocoronary bypass graft: Secondary | ICD-10-CM | POA: Diagnosis not present

## 2017-10-18 DIAGNOSIS — R627 Adult failure to thrive: Secondary | ICD-10-CM | POA: Diagnosis not present

## 2017-10-18 DIAGNOSIS — M1991 Primary osteoarthritis, unspecified site: Secondary | ICD-10-CM | POA: Diagnosis not present

## 2017-10-18 DIAGNOSIS — Z853 Personal history of malignant neoplasm of breast: Secondary | ICD-10-CM | POA: Diagnosis not present

## 2017-10-18 DIAGNOSIS — K279 Peptic ulcer, site unspecified, unspecified as acute or chronic, without hemorrhage or perforation: Secondary | ICD-10-CM | POA: Diagnosis not present

## 2017-10-18 DIAGNOSIS — I2510 Atherosclerotic heart disease of native coronary artery without angina pectoris: Secondary | ICD-10-CM | POA: Diagnosis not present

## 2017-10-18 DIAGNOSIS — E785 Hyperlipidemia, unspecified: Secondary | ICD-10-CM | POA: Diagnosis not present

## 2017-10-18 DIAGNOSIS — R296 Repeated falls: Secondary | ICD-10-CM | POA: Diagnosis not present

## 2017-10-19 DIAGNOSIS — M62462 Contracture of muscle, left lower leg: Secondary | ICD-10-CM | POA: Diagnosis not present

## 2017-10-19 DIAGNOSIS — K219 Gastro-esophageal reflux disease without esophagitis: Secondary | ICD-10-CM | POA: Diagnosis not present

## 2017-10-19 DIAGNOSIS — Z853 Personal history of malignant neoplasm of breast: Secondary | ICD-10-CM | POA: Diagnosis not present

## 2017-10-19 DIAGNOSIS — E039 Hypothyroidism, unspecified: Secondary | ICD-10-CM | POA: Diagnosis not present

## 2017-10-19 DIAGNOSIS — I2510 Atherosclerotic heart disease of native coronary artery without angina pectoris: Secondary | ICD-10-CM | POA: Diagnosis not present

## 2017-10-19 DIAGNOSIS — E785 Hyperlipidemia, unspecified: Secondary | ICD-10-CM | POA: Diagnosis not present

## 2017-10-19 DIAGNOSIS — R296 Repeated falls: Secondary | ICD-10-CM | POA: Diagnosis not present

## 2017-10-19 DIAGNOSIS — D649 Anemia, unspecified: Secondary | ICD-10-CM | POA: Diagnosis not present

## 2017-10-19 DIAGNOSIS — M1991 Primary osteoarthritis, unspecified site: Secondary | ICD-10-CM | POA: Diagnosis not present

## 2017-10-19 DIAGNOSIS — S72002D Fracture of unspecified part of neck of left femur, subsequent encounter for closed fracture with routine healing: Secondary | ICD-10-CM | POA: Diagnosis not present

## 2017-10-19 DIAGNOSIS — R627 Adult failure to thrive: Secondary | ICD-10-CM | POA: Diagnosis not present

## 2017-10-19 DIAGNOSIS — K279 Peptic ulcer, site unspecified, unspecified as acute or chronic, without hemorrhage or perforation: Secondary | ICD-10-CM | POA: Diagnosis not present

## 2017-10-19 DIAGNOSIS — Z951 Presence of aortocoronary bypass graft: Secondary | ICD-10-CM | POA: Diagnosis not present

## 2017-10-20 DIAGNOSIS — Z951 Presence of aortocoronary bypass graft: Secondary | ICD-10-CM | POA: Diagnosis not present

## 2017-10-20 DIAGNOSIS — Z853 Personal history of malignant neoplasm of breast: Secondary | ICD-10-CM | POA: Diagnosis not present

## 2017-10-20 DIAGNOSIS — D649 Anemia, unspecified: Secondary | ICD-10-CM | POA: Diagnosis not present

## 2017-10-20 DIAGNOSIS — K219 Gastro-esophageal reflux disease without esophagitis: Secondary | ICD-10-CM | POA: Diagnosis not present

## 2017-10-20 DIAGNOSIS — E785 Hyperlipidemia, unspecified: Secondary | ICD-10-CM | POA: Diagnosis not present

## 2017-10-20 DIAGNOSIS — K279 Peptic ulcer, site unspecified, unspecified as acute or chronic, without hemorrhage or perforation: Secondary | ICD-10-CM | POA: Diagnosis not present

## 2017-10-20 DIAGNOSIS — R296 Repeated falls: Secondary | ICD-10-CM | POA: Diagnosis not present

## 2017-10-20 DIAGNOSIS — E039 Hypothyroidism, unspecified: Secondary | ICD-10-CM | POA: Diagnosis not present

## 2017-10-20 DIAGNOSIS — S72002D Fracture of unspecified part of neck of left femur, subsequent encounter for closed fracture with routine healing: Secondary | ICD-10-CM | POA: Diagnosis not present

## 2017-10-20 DIAGNOSIS — M1991 Primary osteoarthritis, unspecified site: Secondary | ICD-10-CM | POA: Diagnosis not present

## 2017-10-20 DIAGNOSIS — R627 Adult failure to thrive: Secondary | ICD-10-CM | POA: Diagnosis not present

## 2017-10-20 DIAGNOSIS — M62462 Contracture of muscle, left lower leg: Secondary | ICD-10-CM | POA: Diagnosis not present

## 2017-10-20 DIAGNOSIS — I2510 Atherosclerotic heart disease of native coronary artery without angina pectoris: Secondary | ICD-10-CM | POA: Diagnosis not present

## 2017-10-21 DIAGNOSIS — R627 Adult failure to thrive: Secondary | ICD-10-CM | POA: Diagnosis not present

## 2017-10-21 DIAGNOSIS — D649 Anemia, unspecified: Secondary | ICD-10-CM | POA: Diagnosis not present

## 2017-10-21 DIAGNOSIS — I2510 Atherosclerotic heart disease of native coronary artery without angina pectoris: Secondary | ICD-10-CM | POA: Diagnosis not present

## 2017-10-21 DIAGNOSIS — Z951 Presence of aortocoronary bypass graft: Secondary | ICD-10-CM | POA: Diagnosis not present

## 2017-10-21 DIAGNOSIS — E785 Hyperlipidemia, unspecified: Secondary | ICD-10-CM | POA: Diagnosis not present

## 2017-10-21 DIAGNOSIS — R296 Repeated falls: Secondary | ICD-10-CM | POA: Diagnosis not present

## 2017-10-21 DIAGNOSIS — S72002D Fracture of unspecified part of neck of left femur, subsequent encounter for closed fracture with routine healing: Secondary | ICD-10-CM | POA: Diagnosis not present

## 2017-10-21 DIAGNOSIS — M1991 Primary osteoarthritis, unspecified site: Secondary | ICD-10-CM | POA: Diagnosis not present

## 2017-10-21 DIAGNOSIS — E039 Hypothyroidism, unspecified: Secondary | ICD-10-CM | POA: Diagnosis not present

## 2017-10-21 DIAGNOSIS — Z853 Personal history of malignant neoplasm of breast: Secondary | ICD-10-CM | POA: Diagnosis not present

## 2017-10-21 DIAGNOSIS — K219 Gastro-esophageal reflux disease without esophagitis: Secondary | ICD-10-CM | POA: Diagnosis not present

## 2017-10-21 DIAGNOSIS — M62462 Contracture of muscle, left lower leg: Secondary | ICD-10-CM | POA: Diagnosis not present

## 2017-10-21 DIAGNOSIS — K279 Peptic ulcer, site unspecified, unspecified as acute or chronic, without hemorrhage or perforation: Secondary | ICD-10-CM | POA: Diagnosis not present

## 2017-10-22 DIAGNOSIS — S72002D Fracture of unspecified part of neck of left femur, subsequent encounter for closed fracture with routine healing: Secondary | ICD-10-CM | POA: Diagnosis not present

## 2017-10-22 DIAGNOSIS — M1991 Primary osteoarthritis, unspecified site: Secondary | ICD-10-CM | POA: Diagnosis not present

## 2017-10-22 DIAGNOSIS — E039 Hypothyroidism, unspecified: Secondary | ICD-10-CM | POA: Diagnosis not present

## 2017-10-22 DIAGNOSIS — I2510 Atherosclerotic heart disease of native coronary artery without angina pectoris: Secondary | ICD-10-CM | POA: Diagnosis not present

## 2017-10-22 DIAGNOSIS — R627 Adult failure to thrive: Secondary | ICD-10-CM | POA: Diagnosis not present

## 2017-10-22 DIAGNOSIS — R296 Repeated falls: Secondary | ICD-10-CM | POA: Diagnosis not present

## 2017-10-22 DIAGNOSIS — K279 Peptic ulcer, site unspecified, unspecified as acute or chronic, without hemorrhage or perforation: Secondary | ICD-10-CM | POA: Diagnosis not present

## 2017-10-22 DIAGNOSIS — Z951 Presence of aortocoronary bypass graft: Secondary | ICD-10-CM | POA: Diagnosis not present

## 2017-10-22 DIAGNOSIS — D649 Anemia, unspecified: Secondary | ICD-10-CM | POA: Diagnosis not present

## 2017-10-22 DIAGNOSIS — M62462 Contracture of muscle, left lower leg: Secondary | ICD-10-CM | POA: Diagnosis not present

## 2017-10-22 DIAGNOSIS — E785 Hyperlipidemia, unspecified: Secondary | ICD-10-CM | POA: Diagnosis not present

## 2017-10-22 DIAGNOSIS — Z853 Personal history of malignant neoplasm of breast: Secondary | ICD-10-CM | POA: Diagnosis not present

## 2017-10-22 DIAGNOSIS — K219 Gastro-esophageal reflux disease without esophagitis: Secondary | ICD-10-CM | POA: Diagnosis not present

## 2017-10-23 DIAGNOSIS — S72002D Fracture of unspecified part of neck of left femur, subsequent encounter for closed fracture with routine healing: Secondary | ICD-10-CM | POA: Diagnosis not present

## 2017-10-23 DIAGNOSIS — D649 Anemia, unspecified: Secondary | ICD-10-CM | POA: Diagnosis not present

## 2017-10-23 DIAGNOSIS — I2510 Atherosclerotic heart disease of native coronary artery without angina pectoris: Secondary | ICD-10-CM | POA: Diagnosis not present

## 2017-10-23 DIAGNOSIS — E785 Hyperlipidemia, unspecified: Secondary | ICD-10-CM | POA: Diagnosis not present

## 2017-10-23 DIAGNOSIS — M62462 Contracture of muscle, left lower leg: Secondary | ICD-10-CM | POA: Diagnosis not present

## 2017-10-23 DIAGNOSIS — M1991 Primary osteoarthritis, unspecified site: Secondary | ICD-10-CM | POA: Diagnosis not present

## 2017-10-23 DIAGNOSIS — Z853 Personal history of malignant neoplasm of breast: Secondary | ICD-10-CM | POA: Diagnosis not present

## 2017-10-23 DIAGNOSIS — E039 Hypothyroidism, unspecified: Secondary | ICD-10-CM | POA: Diagnosis not present

## 2017-10-23 DIAGNOSIS — R296 Repeated falls: Secondary | ICD-10-CM | POA: Diagnosis not present

## 2017-10-23 DIAGNOSIS — K219 Gastro-esophageal reflux disease without esophagitis: Secondary | ICD-10-CM | POA: Diagnosis not present

## 2017-10-23 DIAGNOSIS — K279 Peptic ulcer, site unspecified, unspecified as acute or chronic, without hemorrhage or perforation: Secondary | ICD-10-CM | POA: Diagnosis not present

## 2017-10-23 DIAGNOSIS — Z951 Presence of aortocoronary bypass graft: Secondary | ICD-10-CM | POA: Diagnosis not present

## 2017-10-23 DIAGNOSIS — R627 Adult failure to thrive: Secondary | ICD-10-CM | POA: Diagnosis not present

## 2017-10-24 DIAGNOSIS — E039 Hypothyroidism, unspecified: Secondary | ICD-10-CM | POA: Diagnosis not present

## 2017-10-24 DIAGNOSIS — S72002D Fracture of unspecified part of neck of left femur, subsequent encounter for closed fracture with routine healing: Secondary | ICD-10-CM | POA: Diagnosis not present

## 2017-10-24 DIAGNOSIS — E785 Hyperlipidemia, unspecified: Secondary | ICD-10-CM | POA: Diagnosis not present

## 2017-10-24 DIAGNOSIS — M1991 Primary osteoarthritis, unspecified site: Secondary | ICD-10-CM | POA: Diagnosis not present

## 2017-10-24 DIAGNOSIS — Z853 Personal history of malignant neoplasm of breast: Secondary | ICD-10-CM | POA: Diagnosis not present

## 2017-10-24 DIAGNOSIS — D649 Anemia, unspecified: Secondary | ICD-10-CM | POA: Diagnosis not present

## 2017-10-24 DIAGNOSIS — I2510 Atherosclerotic heart disease of native coronary artery without angina pectoris: Secondary | ICD-10-CM | POA: Diagnosis not present

## 2017-10-24 DIAGNOSIS — R627 Adult failure to thrive: Secondary | ICD-10-CM | POA: Diagnosis not present

## 2017-10-24 DIAGNOSIS — K219 Gastro-esophageal reflux disease without esophagitis: Secondary | ICD-10-CM | POA: Diagnosis not present

## 2017-10-24 DIAGNOSIS — Z951 Presence of aortocoronary bypass graft: Secondary | ICD-10-CM | POA: Diagnosis not present

## 2017-10-24 DIAGNOSIS — M62462 Contracture of muscle, left lower leg: Secondary | ICD-10-CM | POA: Diagnosis not present

## 2017-10-24 DIAGNOSIS — R296 Repeated falls: Secondary | ICD-10-CM | POA: Diagnosis not present

## 2017-10-24 DIAGNOSIS — K279 Peptic ulcer, site unspecified, unspecified as acute or chronic, without hemorrhage or perforation: Secondary | ICD-10-CM | POA: Diagnosis not present

## 2017-10-25 DIAGNOSIS — K219 Gastro-esophageal reflux disease without esophagitis: Secondary | ICD-10-CM | POA: Diagnosis not present

## 2017-10-25 DIAGNOSIS — E039 Hypothyroidism, unspecified: Secondary | ICD-10-CM | POA: Diagnosis not present

## 2017-10-25 DIAGNOSIS — M62462 Contracture of muscle, left lower leg: Secondary | ICD-10-CM | POA: Diagnosis not present

## 2017-10-25 DIAGNOSIS — K279 Peptic ulcer, site unspecified, unspecified as acute or chronic, without hemorrhage or perforation: Secondary | ICD-10-CM | POA: Diagnosis not present

## 2017-10-25 DIAGNOSIS — Z951 Presence of aortocoronary bypass graft: Secondary | ICD-10-CM | POA: Diagnosis not present

## 2017-10-25 DIAGNOSIS — M1991 Primary osteoarthritis, unspecified site: Secondary | ICD-10-CM | POA: Diagnosis not present

## 2017-10-25 DIAGNOSIS — I2510 Atherosclerotic heart disease of native coronary artery without angina pectoris: Secondary | ICD-10-CM | POA: Diagnosis not present

## 2017-10-25 DIAGNOSIS — S72002D Fracture of unspecified part of neck of left femur, subsequent encounter for closed fracture with routine healing: Secondary | ICD-10-CM | POA: Diagnosis not present

## 2017-10-25 DIAGNOSIS — Z853 Personal history of malignant neoplasm of breast: Secondary | ICD-10-CM | POA: Diagnosis not present

## 2017-10-25 DIAGNOSIS — D649 Anemia, unspecified: Secondary | ICD-10-CM | POA: Diagnosis not present

## 2017-10-25 DIAGNOSIS — R296 Repeated falls: Secondary | ICD-10-CM | POA: Diagnosis not present

## 2017-10-25 DIAGNOSIS — E785 Hyperlipidemia, unspecified: Secondary | ICD-10-CM | POA: Diagnosis not present

## 2017-10-25 DIAGNOSIS — R627 Adult failure to thrive: Secondary | ICD-10-CM | POA: Diagnosis not present

## 2017-10-26 DIAGNOSIS — R296 Repeated falls: Secondary | ICD-10-CM | POA: Diagnosis not present

## 2017-10-26 DIAGNOSIS — E785 Hyperlipidemia, unspecified: Secondary | ICD-10-CM | POA: Diagnosis not present

## 2017-10-26 DIAGNOSIS — K279 Peptic ulcer, site unspecified, unspecified as acute or chronic, without hemorrhage or perforation: Secondary | ICD-10-CM | POA: Diagnosis not present

## 2017-10-26 DIAGNOSIS — S72002D Fracture of unspecified part of neck of left femur, subsequent encounter for closed fracture with routine healing: Secondary | ICD-10-CM | POA: Diagnosis not present

## 2017-10-26 DIAGNOSIS — K219 Gastro-esophageal reflux disease without esophagitis: Secondary | ICD-10-CM | POA: Diagnosis not present

## 2017-10-26 DIAGNOSIS — I2510 Atherosclerotic heart disease of native coronary artery without angina pectoris: Secondary | ICD-10-CM | POA: Diagnosis not present

## 2017-10-26 DIAGNOSIS — E039 Hypothyroidism, unspecified: Secondary | ICD-10-CM | POA: Diagnosis not present

## 2017-10-26 DIAGNOSIS — D649 Anemia, unspecified: Secondary | ICD-10-CM | POA: Diagnosis not present

## 2017-10-26 DIAGNOSIS — Z853 Personal history of malignant neoplasm of breast: Secondary | ICD-10-CM | POA: Diagnosis not present

## 2017-10-26 DIAGNOSIS — R627 Adult failure to thrive: Secondary | ICD-10-CM | POA: Diagnosis not present

## 2017-10-26 DIAGNOSIS — Z951 Presence of aortocoronary bypass graft: Secondary | ICD-10-CM | POA: Diagnosis not present

## 2017-10-26 DIAGNOSIS — M62462 Contracture of muscle, left lower leg: Secondary | ICD-10-CM | POA: Diagnosis not present

## 2017-10-26 DIAGNOSIS — M1991 Primary osteoarthritis, unspecified site: Secondary | ICD-10-CM | POA: Diagnosis not present

## 2017-10-27 DIAGNOSIS — Z853 Personal history of malignant neoplasm of breast: Secondary | ICD-10-CM | POA: Diagnosis not present

## 2017-10-27 DIAGNOSIS — Z951 Presence of aortocoronary bypass graft: Secondary | ICD-10-CM | POA: Diagnosis not present

## 2017-10-27 DIAGNOSIS — S72002D Fracture of unspecified part of neck of left femur, subsequent encounter for closed fracture with routine healing: Secondary | ICD-10-CM | POA: Diagnosis not present

## 2017-10-27 DIAGNOSIS — K219 Gastro-esophageal reflux disease without esophagitis: Secondary | ICD-10-CM | POA: Diagnosis not present

## 2017-10-27 DIAGNOSIS — K279 Peptic ulcer, site unspecified, unspecified as acute or chronic, without hemorrhage or perforation: Secondary | ICD-10-CM | POA: Diagnosis not present

## 2017-10-27 DIAGNOSIS — I2510 Atherosclerotic heart disease of native coronary artery without angina pectoris: Secondary | ICD-10-CM | POA: Diagnosis not present

## 2017-10-27 DIAGNOSIS — R627 Adult failure to thrive: Secondary | ICD-10-CM | POA: Diagnosis not present

## 2017-10-27 DIAGNOSIS — E039 Hypothyroidism, unspecified: Secondary | ICD-10-CM | POA: Diagnosis not present

## 2017-10-27 DIAGNOSIS — D649 Anemia, unspecified: Secondary | ICD-10-CM | POA: Diagnosis not present

## 2017-10-27 DIAGNOSIS — R296 Repeated falls: Secondary | ICD-10-CM | POA: Diagnosis not present

## 2017-10-27 DIAGNOSIS — M62462 Contracture of muscle, left lower leg: Secondary | ICD-10-CM | POA: Diagnosis not present

## 2017-10-27 DIAGNOSIS — E785 Hyperlipidemia, unspecified: Secondary | ICD-10-CM | POA: Diagnosis not present

## 2017-10-27 DIAGNOSIS — M1991 Primary osteoarthritis, unspecified site: Secondary | ICD-10-CM | POA: Diagnosis not present

## 2017-10-28 DIAGNOSIS — Z951 Presence of aortocoronary bypass graft: Secondary | ICD-10-CM | POA: Diagnosis not present

## 2017-10-28 DIAGNOSIS — E039 Hypothyroidism, unspecified: Secondary | ICD-10-CM | POA: Diagnosis not present

## 2017-10-28 DIAGNOSIS — S72002D Fracture of unspecified part of neck of left femur, subsequent encounter for closed fracture with routine healing: Secondary | ICD-10-CM | POA: Diagnosis not present

## 2017-10-28 DIAGNOSIS — M1991 Primary osteoarthritis, unspecified site: Secondary | ICD-10-CM | POA: Diagnosis not present

## 2017-10-28 DIAGNOSIS — I2510 Atherosclerotic heart disease of native coronary artery without angina pectoris: Secondary | ICD-10-CM | POA: Diagnosis not present

## 2017-10-28 DIAGNOSIS — R296 Repeated falls: Secondary | ICD-10-CM | POA: Diagnosis not present

## 2017-10-28 DIAGNOSIS — E785 Hyperlipidemia, unspecified: Secondary | ICD-10-CM | POA: Diagnosis not present

## 2017-10-28 DIAGNOSIS — D649 Anemia, unspecified: Secondary | ICD-10-CM | POA: Diagnosis not present

## 2017-10-28 DIAGNOSIS — K219 Gastro-esophageal reflux disease without esophagitis: Secondary | ICD-10-CM | POA: Diagnosis not present

## 2017-10-28 DIAGNOSIS — M62462 Contracture of muscle, left lower leg: Secondary | ICD-10-CM | POA: Diagnosis not present

## 2017-10-28 DIAGNOSIS — K279 Peptic ulcer, site unspecified, unspecified as acute or chronic, without hemorrhage or perforation: Secondary | ICD-10-CM | POA: Diagnosis not present

## 2017-10-28 DIAGNOSIS — Z853 Personal history of malignant neoplasm of breast: Secondary | ICD-10-CM | POA: Diagnosis not present

## 2017-10-28 DIAGNOSIS — R627 Adult failure to thrive: Secondary | ICD-10-CM | POA: Diagnosis not present

## 2017-10-29 DIAGNOSIS — I2510 Atherosclerotic heart disease of native coronary artery without angina pectoris: Secondary | ICD-10-CM | POA: Diagnosis not present

## 2017-10-30 DIAGNOSIS — I2510 Atherosclerotic heart disease of native coronary artery without angina pectoris: Secondary | ICD-10-CM | POA: Diagnosis not present

## 2017-10-31 DIAGNOSIS — I2510 Atherosclerotic heart disease of native coronary artery without angina pectoris: Secondary | ICD-10-CM | POA: Diagnosis not present

## 2017-11-01 DIAGNOSIS — I2510 Atherosclerotic heart disease of native coronary artery without angina pectoris: Secondary | ICD-10-CM | POA: Diagnosis not present

## 2017-11-02 DIAGNOSIS — M1990 Unspecified osteoarthritis, unspecified site: Secondary | ICD-10-CM | POA: Diagnosis not present

## 2017-11-02 DIAGNOSIS — I2510 Atherosclerotic heart disease of native coronary artery without angina pectoris: Secondary | ICD-10-CM | POA: Diagnosis not present

## 2017-11-03 DIAGNOSIS — I2510 Atherosclerotic heart disease of native coronary artery without angina pectoris: Secondary | ICD-10-CM | POA: Diagnosis not present

## 2017-11-04 DIAGNOSIS — I2510 Atherosclerotic heart disease of native coronary artery without angina pectoris: Secondary | ICD-10-CM | POA: Diagnosis not present

## 2017-11-05 DIAGNOSIS — I2510 Atherosclerotic heart disease of native coronary artery without angina pectoris: Secondary | ICD-10-CM | POA: Diagnosis not present

## 2017-11-06 DIAGNOSIS — I2510 Atherosclerotic heart disease of native coronary artery without angina pectoris: Secondary | ICD-10-CM | POA: Diagnosis not present

## 2017-11-07 DIAGNOSIS — I2510 Atherosclerotic heart disease of native coronary artery without angina pectoris: Secondary | ICD-10-CM | POA: Diagnosis not present

## 2017-12-05 DIAGNOSIS — L603 Nail dystrophy: Secondary | ICD-10-CM | POA: Diagnosis not present

## 2017-12-05 DIAGNOSIS — I739 Peripheral vascular disease, unspecified: Secondary | ICD-10-CM | POA: Diagnosis not present

## 2017-12-05 DIAGNOSIS — B351 Tinea unguium: Secondary | ICD-10-CM | POA: Diagnosis not present

## 2017-12-11 DIAGNOSIS — Z789 Other specified health status: Secondary | ICD-10-CM | POA: Diagnosis not present

## 2017-12-11 DIAGNOSIS — Z515 Encounter for palliative care: Secondary | ICD-10-CM | POA: Diagnosis not present

## 2017-12-11 DIAGNOSIS — E441 Mild protein-calorie malnutrition: Secondary | ICD-10-CM | POA: Diagnosis not present

## 2018-01-23 DIAGNOSIS — M8000XD Age-related osteoporosis with current pathological fracture, unspecified site, subsequent encounter for fracture with routine healing: Secondary | ICD-10-CM | POA: Diagnosis not present

## 2018-01-23 DIAGNOSIS — E441 Mild protein-calorie malnutrition: Secondary | ICD-10-CM | POA: Diagnosis not present

## 2018-01-23 DIAGNOSIS — Z789 Other specified health status: Secondary | ICD-10-CM | POA: Diagnosis not present

## 2018-02-06 DIAGNOSIS — R52 Pain, unspecified: Secondary | ICD-10-CM | POA: Diagnosis not present

## 2018-02-06 DIAGNOSIS — E039 Hypothyroidism, unspecified: Secondary | ICD-10-CM | POA: Diagnosis not present

## 2018-02-07 DIAGNOSIS — Z79899 Other long term (current) drug therapy: Secondary | ICD-10-CM | POA: Diagnosis not present

## 2018-02-07 DIAGNOSIS — E119 Type 2 diabetes mellitus without complications: Secondary | ICD-10-CM | POA: Diagnosis not present

## 2018-02-07 DIAGNOSIS — E039 Hypothyroidism, unspecified: Secondary | ICD-10-CM | POA: Diagnosis not present

## 2018-02-07 DIAGNOSIS — D649 Anemia, unspecified: Secondary | ICD-10-CM | POA: Diagnosis not present

## 2018-02-11 IMAGING — CT CT CHEST W/ CM
2 of 5 series · 13 of 36 positions shown, 16 images · IV contrast (iopamidol)
Comparison: Chest CT on 10/12/2015 and AP CT on 02/14/2017

CLINICAL DATA: None localized abdominal pain and unintended weight
loss. Adult failure to thrive. Personal history of breast carcinoma.

EXAM:
CT CHEST, ABDOMEN, AND PELVIS WITH CONTRAST
TECHNIQUE: Multidetector CT imaging of the chest, abdomen and pelvis was
performed following the standard protocol during bolus
administration of intravenous contrast.
CONTRAST:  60mL [id number] IOPAMIDOL ([id number]) INJECTION 61%

[Series 2: cap with · axial · 0.65mm/px · z∈[-493,-68]mm · 10 of 105 slices shown, 13 images]
[im 10/105  mediastinal]
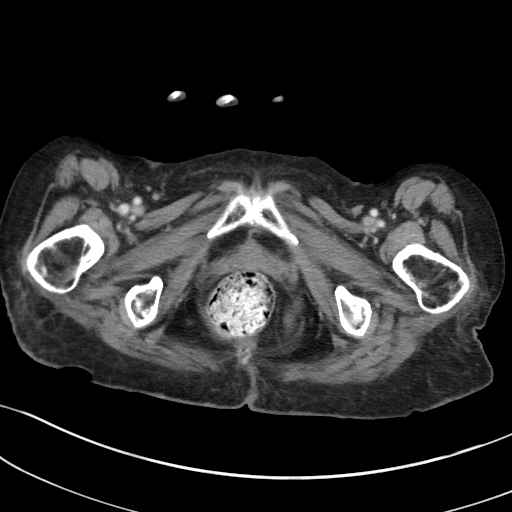
[im 10/105  lung]
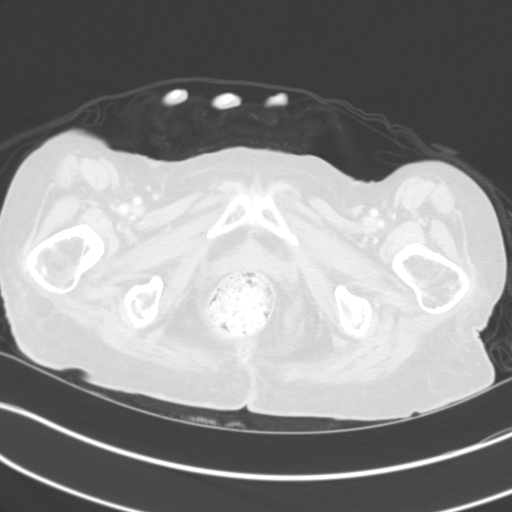
[im 19/105  lung]
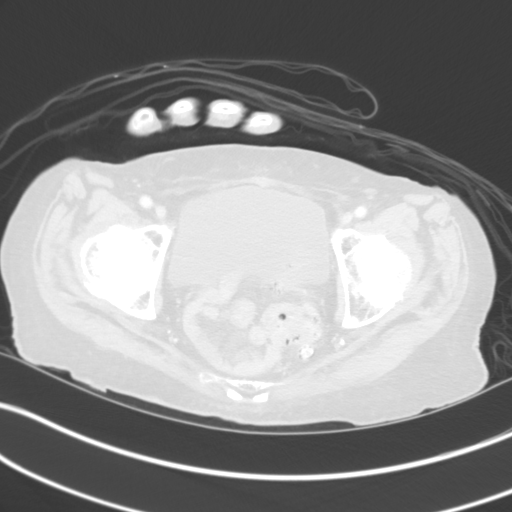
[im 29/105  lung]
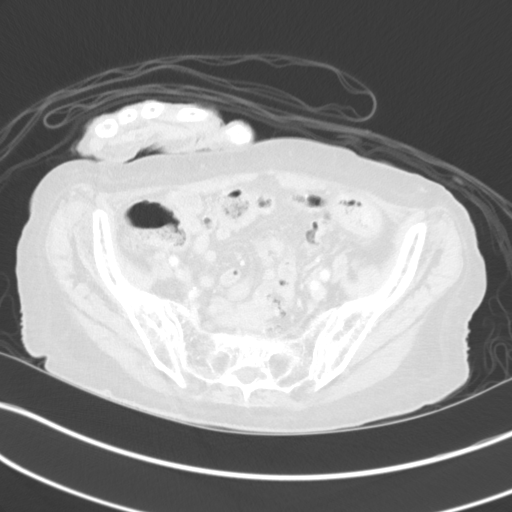
[im 38/105  lung]
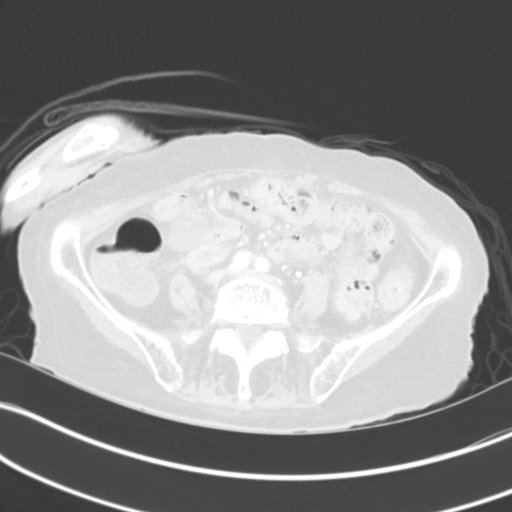
[im 48/105  mediastinal]
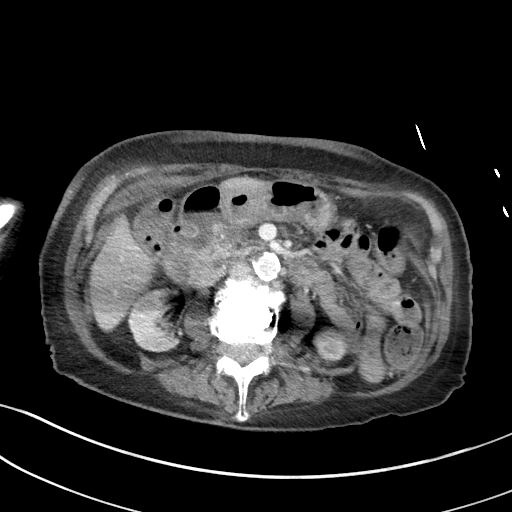
[im 48/105  lung]
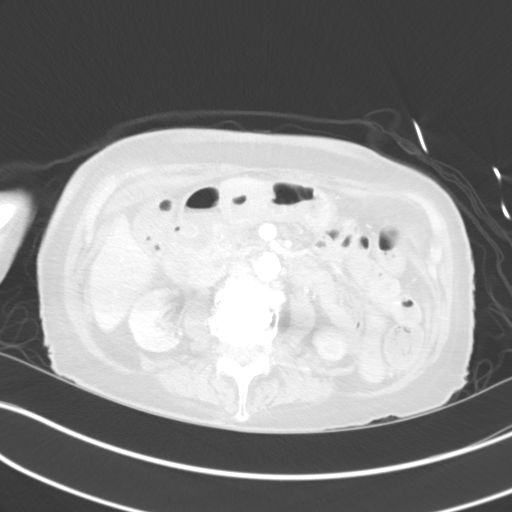
[im 57/105  lung]
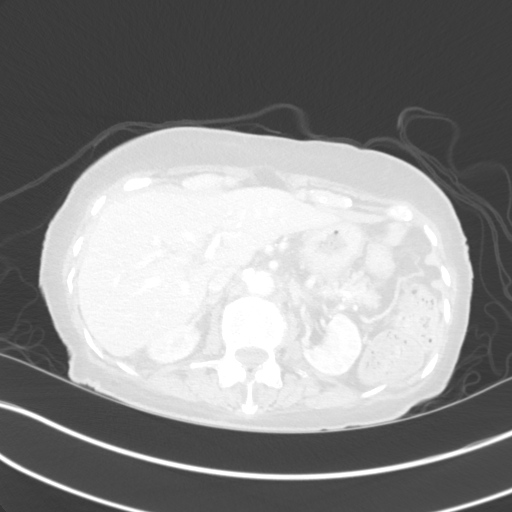
[im 67/105  lung]
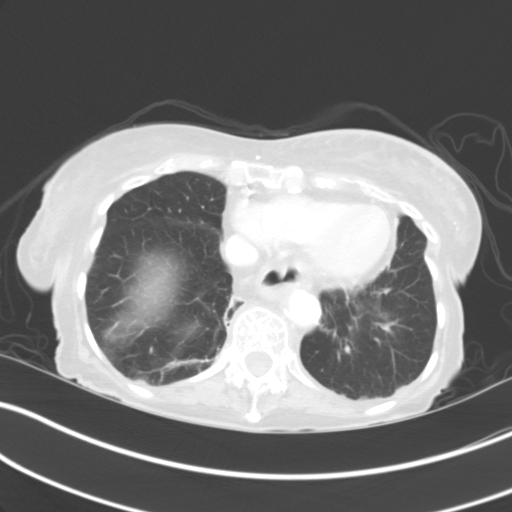
[im 76/105  lung]
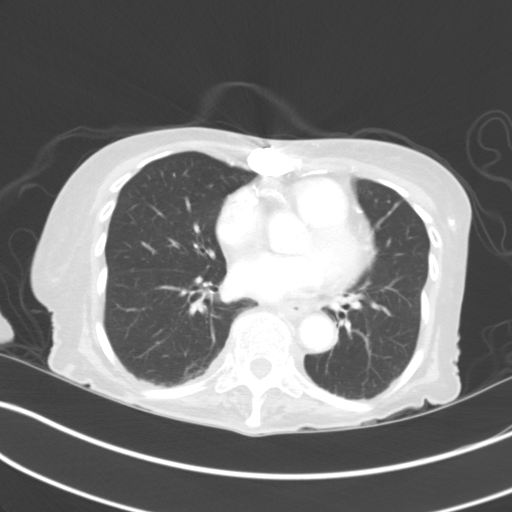
[im 86/105  mediastinal]
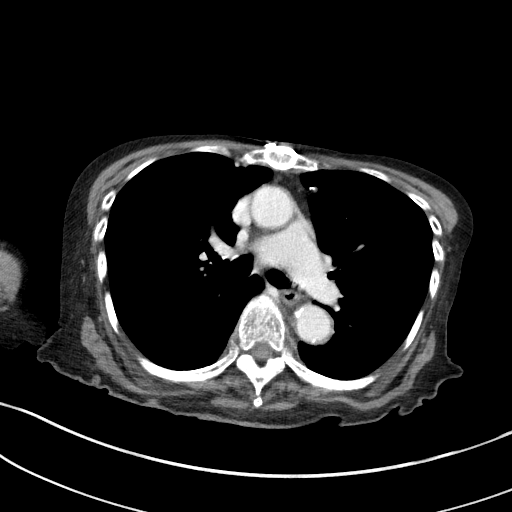
[im 86/105  lung]
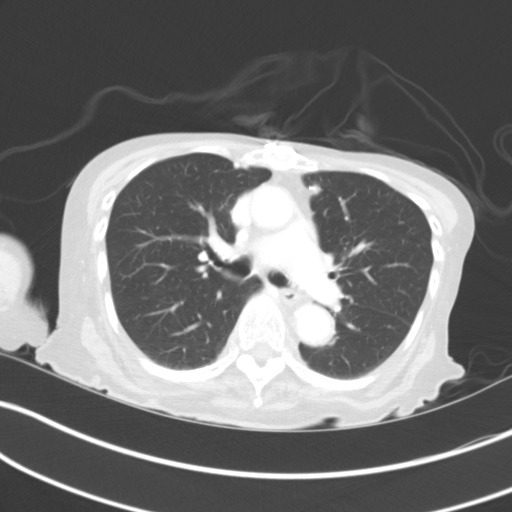
[im 95/105  lung]
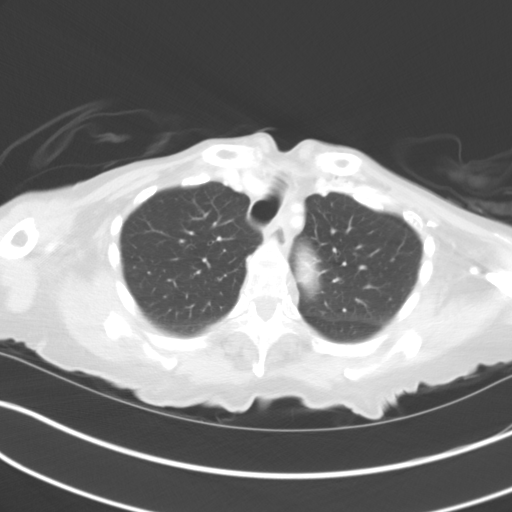

[Series 5: coronals · coronal · 0.65mm/px · 3 of 92 slices shown]
[im 19/92  lung]
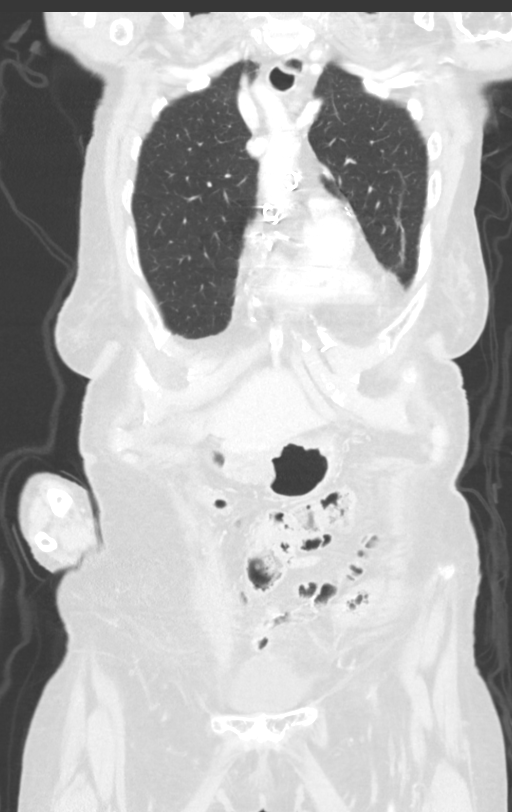
[im 37/92  lung]
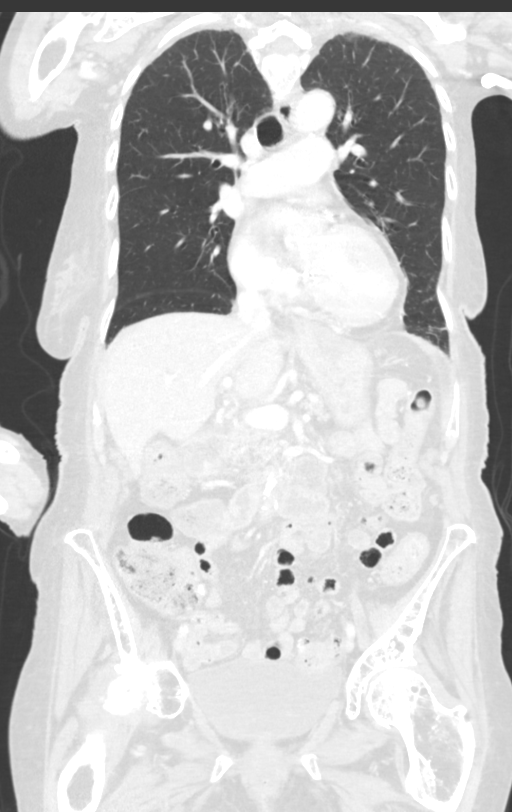
[im 55/92  lung]
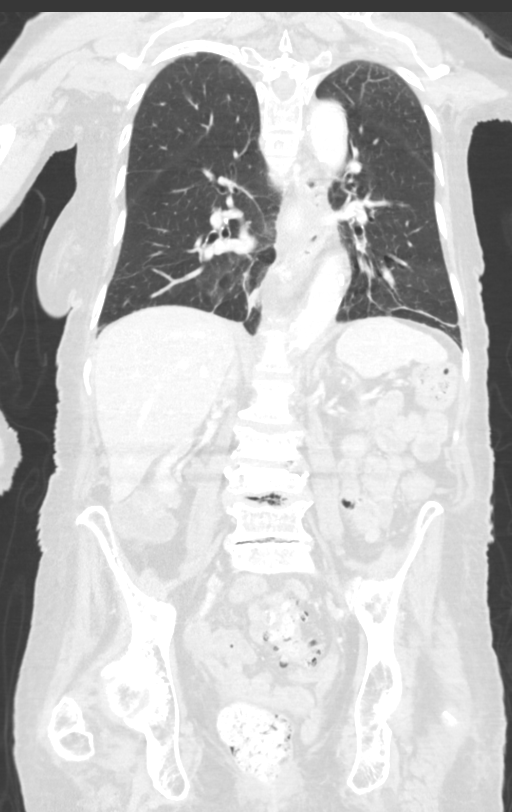

[13 of 36 positions shown; findings below may reference images not displayed]

FINDINGS: CT CHEST FINDINGS

Cardiovascular: No acute findings. Aortic and coronary artery
atherosclerosis. Prior CABG.

Mediastinum/Lymph Nodes: No masses or pathologically enlarged lymph
nodes identified. Stable post lumpectomy changes in left breast.

Lungs/Pleura: No pulmonary infiltrate or mass identified. Mild
bibasilar scarring. No effusion present.

Musculoskeletal: Stable compression fractures of T6, T8, T9, and T11
vertebral bodies.

CT ABDOMEN AND PELVIS FINDINGS

Hepatobiliary: No masses identified. Prior cholecystectomy. No
evidence of biliary obstruction.

Pancreas:  No mass or inflammatory changes.

Spleen:  Within normal limits in size and appearance.

Adrenals/Urinary tract: No masses or hydronephrosis. Stable small
left renal cyst.

Stomach/Bowel: Stable small hiatal hernia. Stable gastric antrum
wall thickening with gas containing ulcer in the superior wall. No
evidence free intraperitoneal air. Sigmoid diverticulosis, without
evidence of diverticulitis . No evidence of bowel obstruction.

Vascular/Lymphatic: No pathologically enlarged lymph nodes
identified. No abdominal aortic aneurysm. Aortic atherosclerosis.

Reproductive: Prior hysterectomy noted. Adnexal regions are
unremarkable in appearance.

Other:  None.

Musculoskeletal: No suspicious bone lesions identified. Previous L3
and L4 vertebroplasties.
IMPRESSION: No significant change in appearance of gastric antral ulcer. No
evidence of perforation or other complication.

Stable small hiatal hernia.

Colonic diverticulosis, without radiographic evidence of
diverticulitis.

Aortic and coronary artery atherosclerosis.

Old thoracic and lumbar vertebral compression fracture deformities.
Previous L3 and L4 vertebroplasties.

## 2018-02-21 DIAGNOSIS — H5789 Other specified disorders of eye and adnexa: Secondary | ICD-10-CM | POA: Diagnosis not present

## 2018-03-22 ENCOUNTER — Emergency Department
Admission: EM | Admit: 2018-03-22 | Discharge: 2018-03-22 | Disposition: A | Discharge status: Home / Self Care | Payer: Medicare Other | Attending: Emergency Medicine | Admitting: Emergency Medicine

## 2018-03-22 ENCOUNTER — Other Ambulatory Visit: Payer: Self-pay

## 2018-03-22 ENCOUNTER — Emergency Department: Payer: Medicare Other

## 2018-03-22 ENCOUNTER — Encounter: Payer: Self-pay | Admitting: Emergency Medicine

## 2018-03-22 DIAGNOSIS — I2510 Atherosclerotic heart disease of native coronary artery without angina pectoris: Secondary | ICD-10-CM | POA: Insufficient documentation

## 2018-03-22 DIAGNOSIS — F0390 Unspecified dementia without behavioral disturbance: Secondary | ICD-10-CM | POA: Diagnosis not present

## 2018-03-22 DIAGNOSIS — Z9049 Acquired absence of other specified parts of digestive tract: Secondary | ICD-10-CM | POA: Diagnosis not present

## 2018-03-22 DIAGNOSIS — Z853 Personal history of malignant neoplasm of breast: Secondary | ICD-10-CM | POA: Insufficient documentation

## 2018-03-22 DIAGNOSIS — Z79899 Other long term (current) drug therapy: Secondary | ICD-10-CM | POA: Insufficient documentation

## 2018-03-22 DIAGNOSIS — Z951 Presence of aortocoronary bypass graft: Secondary | ICD-10-CM | POA: Diagnosis not present

## 2018-03-22 DIAGNOSIS — Z7401 Bed confinement status: Secondary | ICD-10-CM | POA: Diagnosis not present

## 2018-03-22 DIAGNOSIS — E039 Hypothyroidism, unspecified: Secondary | ICD-10-CM | POA: Diagnosis not present

## 2018-03-22 DIAGNOSIS — M25561 Pain in right knee: Secondary | ICD-10-CM | POA: Insufficient documentation

## 2018-03-22 DIAGNOSIS — M79661 Pain in right lower leg: Secondary | ICD-10-CM | POA: Diagnosis not present

## 2018-03-22 DIAGNOSIS — M79604 Pain in right leg: Secondary | ICD-10-CM | POA: Diagnosis present

## 2018-03-22 DIAGNOSIS — M6281 Muscle weakness (generalized): Secondary | ICD-10-CM | POA: Diagnosis not present

## 2018-03-22 DIAGNOSIS — S8991XA Unspecified injury of right lower leg, initial encounter: Secondary | ICD-10-CM | POA: Diagnosis not present

## 2018-03-22 NOTE — ED Notes (Signed)
Pt returned to ED Rm 5 from MRI at this time.

## 2018-03-22 NOTE — ED Triage Notes (Signed)
Patient presents to the ED via EMS from [facility] post x-ray that showed mildly displaced fracture through the medial tibial metaphysis up to the medial tibial plateau in the area of the medial tibial sine.  Patient has history of osteoporosis.  Patient is a poor historian.  This RN is unsure of whether a fall was reported.  Patient is oriented to self only.

## 2018-03-22 NOTE — ED Provider Notes (Signed)
[facility] Emergency Department Provider Note   ____________________________________________   First MD Initiated Contact with Patient 03/22/18 1542     (approximate)  I have reviewed the triage vital signs and the nursing notes.   HISTORY  Chief Complaint Knee Pain    HPI Angela Howe is a 84 y.o. female who comes from the nursing home x-ray report saying she has a right sided tibial plateau fracture.  Patient reports she fell today onto the floor.  She denies any other injury.  She has had pain in the whole distal right leg.  She is able to move the knee though.  Patient's daughter says she does not walk.  Patient says she does walk.  Patient's daughter says she usually gets around in a wheelchair.  Patient has a recent hip fracture that was repaired.   Past Medical History:  Diagnosis Date  . Breast cancer ([place])   . CAD (coronary artery disease)   . Degenerative disc disease   . Dementia ([place])   . GERD (gastroesophageal reflux disease)   . Hyperlipidemia    Previous intolerance of statins  . Hypothyroidism   . Orthostatic hypotension   . Osteoarthritis   . Peptic ulcer disease   . Syncope     Patient Active Problem List   Diagnosis Date Noted  . Hip fracture ([place]) 04/17/2017  . Subacute delirium 02/21/2017  . Palliative care by specialist   . Advance care planning   . Goals of care, counseling/discussion   . Dehydration 02/20/2017  . Pancolitis ([place]) 12/21/2016  . Unsteady gait 08/13/2016  . Falls frequently 07/04/2016  . Chest pain 10/12/2015  . Rib fracture 10/03/2015  . Right carotid bruit 08/01/2015  . Loss of weight 01/12/2015  . Anemia 07/18/2014  . Health care maintenance 07/18/2014  . Hoarseness 07/18/2014  . Abdominal pain 05/05/2014  . Back pain 10/05/2013  . Knee pain 07/14/2013  . Oral pain 05/06/2013  . Itching 01/05/2013  . History of breast cancer 11/20/2012  . Syncope 11/20/2012  . Hypothyroidism  08/10/2012  . Difficulty sleeping 08/10/2012  . Dizziness 11/08/2010  . Hyperlipemia 03/23/2009  . HYPERTENSION, BENIGN 03/23/2009  . CAD, NATIVE VESSEL 09/24/2008  . ORTHOSTATIC HYPOTENSION 09/24/2008  . ABNORMAL EKG 09/24/2008    Past Surgical History:  Procedure Laterality Date  . ABDOMINAL HYSTERECTOMY  1970  . APPENDECTOMY    . BIOPSY BREAST  2014  . BREAST EXCISIONAL BIOPSY Left 2014   neg  . BREAST EXCISIONAL BIOPSY Left 2006   postive radation  . BREAST LUMPECTOMY  2006  . CHOLECYSTECTOMY    . CORONARY ARTERY BYPASS GRAFT  05/2006   x3  . INTRAMEDULLARY (IM) NAIL INTERTROCHANTERIC Left 04/18/2017   Procedure: INTRAMEDULLARY (IM) NAIL INTERTROCHANTRIC;  Surgeon: Lovell Sheehan, MD;  Location: [facility] ORS;  Service: Orthopedics;  Laterality: Left;  . NASAL SINUS SURGERY    . ROTATOR CUFF REPAIR      Prior to Admission medications   Medication Sig Start Date End Date Taking? Authorizing Provider  amitriptyline (ELAVIL) 25 MG tablet Take 1 tablet (25 mg total) by mouth at bedtime. 02/23/17  Yes Dustin Flock, MD  calcium-vitamin D (OSCAL WITH D) 500-200 MG-UNIT tablet Take 2 tablets by mouth 2 (two) times daily. 02/23/17  Yes Dustin Flock, MD  citalopram (CELEXA) 20 MG tablet Take 1 tablet (20 mg total) by mouth daily. 08/08/16  Yes Einar Pheasant, MD  fludrocortisone (FLORINEF) 0.1 MG tablet Take 1 tablet (0.1  mg total) by mouth daily. 03/22/15  Yes Einar Pheasant, MD  levothyroxine (SYNTHROID, LEVOTHROID) 50 MCG tablet Take 1 tablet (50 mcg total) by mouth daily. 08/08/16  Yes Einar Pheasant, MD  Melatonin 5 MG TABS Take 5 mg by mouth at bedtime.   Yes [provider]  metoprolol tartrate (LOPRESSOR) 25 MG tablet Take 1 tablet (25 mg total) by mouth 2 (two) times daily. 04/20/17 04/20/18 Yes Dustin Flock, MD  Multiple Vitamin (MULTIVITAMIN WITH MINERALS) TABS tablet Take 1 tablet by mouth daily. 02/23/17  Yes Dustin Flock, MD  nitroGLYCERIN (NITROSTAT)  0.4 MG SL tablet Place 1 tablet (0.4 mg total) under the tongue every 5 (five) minutes as needed for chest pain. 07/10/14  Yes Einar Pheasant, MD  QUEtiapine (SEROQUEL) 25 MG tablet Take 25 mg by mouth at bedtime.    Yes [provider]  sennosides-docusate sodium (SENOKOT-S) 8.6-50 MG tablet Take 2 tablets by mouth daily.   Yes [provider]  acetaminophen (TYLENOL) 325 MG tablet Take 2 tablets (650 mg total) by mouth every 6 (six) hours as needed for mild pain (or Fever >/= 101). 02/23/17   Dustin Flock, MD  atorvastatin (LIPITOR) 20 MG tablet Take 1 tablet (20 mg total) by mouth daily. Patient not taking: Reported on 04/17/2017 08/08/16   Einar Pheasant, MD  fentaNYL (DURAGESIC - DOSED MCG/HR) 12 MCG/HR Place 1 patch (12.5 mcg total) onto the skin every 3 (three) days. 04/20/17   Dustin Flock, MD  HYDROcodone-acetaminophen (NORCO/VICODIN) 5-325 MG tablet Take 1 tablet by mouth every 4 (four) hours as needed for moderate pain ((score 4 to 6)). 04/20/17   Dustin Flock, MD  lidocaine (LIDODERM) 5 % Place 2 patches onto the skin daily. Remove & Discard patch within 12 hours or as directed by MD 02/23/17   Dustin Flock, MD  megestrol (MEGACE ES) 625 MG/5ML suspension Take 5 mLs (625 mg total) by mouth daily. 02/23/17   Dustin Flock, MD    Allergies Alendronate sodium and Prednisone  Family History  Problem Relation Age of Onset  . Heart attack Sister   . Cancer Brother   . Pancreatic cancer Brother   . Ovarian cancer Sister     Social History Social History   Tobacco Use  . Smoking status: Never Smoker  . Smokeless tobacco: Never Used  Substance Use Topics  . Alcohol use: No    Alcohol/week: 0.0 standard drinks  . Drug use: No    Review of Systems  Constitutional: No fever/chills Eyes: No visual changes. ENT: No sore throat. Cardiovascular: Denies chest pain. Respiratory: Denies shortness of breath. Gastrointestinal: No abdominal pain.  No  nausea, no vomiting.  No diarrhea.  No constipation. Genitourinary: Negative for dysuria. Musculoskeletal: Negative for back pain. Skin: Negative for rash. Neurological: Negative for headaches, focal weakness    ____________________________________________   PHYSICAL EXAM:  VITAL SIGNS: ED Triage Vitals  Enc Vitals Group     BP 03/22/18 1529 (!) 81/25     Pulse Rate 03/22/18 1529 (!) 105     Resp 03/22/18 1531 18     Temp 03/22/18 1529 98.1 F (36.7 C)     Temp Source 03/22/18 1529 Oral     SpO2 03/22/18 1529 96 %     Weight 03/22/18 1530 103 lb (46.7 kg)     Height 03/22/18 1530 5' 0.5" (1.537 m)     Head Circumference --      Peak Flow --      Pain Score --  Pain Loc --      Pain Edu? --      Excl. in [place]? --     Constitutional: Alert and oriented. Well appearing and in no acute distress. Eyes: Conjunctivae are normal.  Head: Atraumatic. Nose: No congestion/rhinnorhea. Mouth/Throat: Mucous membranes are moist.  Oropharynx non-erythematous. Neck: No stridor Cardiovascular: Normal rate, regular rhythm. Grossly normal heart sounds.  Good peripheral circulation. Respiratory: Normal respiratory effort.  No retractions. Lungs CTAB. Gastrointestinal: Soft and nontender. No distention. No abdominal bruits. No CVA tenderness. Musculoskeletal: Left leg is not tender with good range of motion of the knee right leg tibia is tender from the ankle proximally to the knee.  The knee is slightly swollen. Neurologic:  Normal speech and language. Skin:  Skin is warm, dry and intact. No rash noted. Psychiatric: Mood and affect are normal. Speech and behavior are normal.  ____________________________________________   LABS (all labs ordered are listed, but only abnormal results are displayed)  Labs Reviewed - No data to display ____________________________________________  EKG   ____________________________________________  RADIOLOGY  ED MD interpretation: X-rays negative  for fracture however patient comes with a report to say she did fracture at therefore we will get an MRI MRI is negative for fracture  Official radiology report(s): Dg Tibia/fibula Right  Result Date: 03/22/2018 CLINICAL DATA:  Fall, pain. Reported tibial plateau fracture. Pain all over the distal tibia. EXAM: RIGHT TIBIA AND FIBULA - 2 VIEW COMPARISON:  Knee series 02/14/2017 FINDINGS: No visible acute bony abnormality. No fracture, subluxation or dislocation. Mild degenerative changes in the right knee. No visible joint effusion. Soft tissues are intact. IMPRESSION: No acute bony abnormality. Electronically Signed   By: Rolm Baptise M.D.   On: 03/22/2018 16:28   Mr Knee Right Wo Contrast  Result Date: 03/22/2018 CLINICAL DATA:  Patient fell at nursing home with right knee pain and inability to straighten leg. EXAM: MRI OF THE RIGHT KNEE WITHOUT CONTRAST TECHNIQUE: Multiplanar, multisequence MR imaging of the knee was performed. No intravenous contrast was administered. COMPARISON:  None. FINDINGS: MENISCI Medial meniscus:  Intact. Lateral meniscus:  Choose LIGAMENTS Cruciates:  Intact ACL and PCL. Collaterals: Medial collateral ligament is intact. Lateral collateral ligament complex is intact. CARTILAGE Patellofemoral: Minimal fissuring of the cartilage with chondral thinning and resultant subchondral edema / cystic change involving the cartilage adjacent to the median ridge of the patella bilaterally. Medial:  Intact Lateral:  Intact Joint:  No joint effusion Popliteal Fossa:  Tiny popliteal cyst.  Intact popliteus. Extensor Mechanism:  Intact quadriceps tendon and patellar tendon. Bones: No focal marrow signal abnormality. No fracture or dislocation. Other: None. IMPRESSION: 1. Minimal chondral thinning and fissuring of the patellar cartilage adjacent to the median ridge of the patella. 2. Intact cruciate and collateral ligaments. 3. Intact menisci. 4. No acute osseous abnormality. Electronically  Signed   By: Ashley Royalty M.D.   On: 03/22/2018 22:24    ____________________________________________   PROCEDURES  Procedure(s) performed:   Procedures  Critical Care performed:   ____________________________________________   INITIAL IMPRESSION / ASSESSMENT AND PLAN / ED COURSE  MRI is negative.  Patient uses a wheelchair.  We will have her go home back to the nursing home use her wheelchair Tylenol if needed for pain.  The knee does not have an effusion at present is a little bit bruised but she is moving it and has been moving at the whole time.  Do not think she did anything but bruised the knee.  ____________________________________________   FINAL CLINICAL IMPRESSION(S) / ED DIAGNOSES  Final diagnoses:  Acute pain of right knee     ED Discharge Orders    None       Note:  This document was prepared using Dragon voice recognition software and may include unintentional dictation errors.    Nena Polio, MD 03/22/18 2238

## 2018-03-22 NOTE — ED Notes (Signed)
FIRST NURSE NOTE: Pt coming via EMS from [facility] with reports of left knee pain, hx of fracture. Pt has hx of dementia.

## 2018-03-22 NOTE — Discharge Instructions (Addendum)
Tylenol for pain.  Please follow-up with your regular doctor in the next few days.  Can use ice on it 20 minutes every hour but do not fall asleep with the ice on as you can get frostbite.  Make sure there is a towel between you and the ice.  Tomorrow in the next day he can try heat to see if that works better.  The same instructions applied to the he do not fall asleep with the heat on as it could burn you.  Keep a towel between you and a heating pad.  Only use it for 20 minutes every hour.

## 2018-03-22 NOTE — ED Triage Notes (Deleted)
Pt in via [facility] from home with complaints of swelling and pain to right knee since Friday, pt takes Percocet for chronic back pain, denies any relief with knee pain.  Pt denies any recent injury, reports hx arthritis.  Pt ambulatory to triage.  NAD noted at this time.

## 2018-03-22 NOTE — ED Notes (Signed)
Unable to obtain e-signature at d/c (pt has a h/x of dementia and no family at bedside).

## 2018-03-25 DIAGNOSIS — L603 Nail dystrophy: Secondary | ICD-10-CM | POA: Diagnosis not present

## 2018-03-25 DIAGNOSIS — I739 Peripheral vascular disease, unspecified: Secondary | ICD-10-CM | POA: Diagnosis not present

## 2018-03-25 DIAGNOSIS — B351 Tinea unguium: Secondary | ICD-10-CM | POA: Diagnosis not present

## 2018-03-27 DIAGNOSIS — M5136 Other intervertebral disc degeneration, lumbar region: Secondary | ICD-10-CM | POA: Diagnosis not present

## 2018-03-27 DIAGNOSIS — E441 Mild protein-calorie malnutrition: Secondary | ICD-10-CM | POA: Diagnosis not present

## 2018-03-27 DIAGNOSIS — Z515 Encounter for palliative care: Secondary | ICD-10-CM | POA: Diagnosis not present

## 2018-04-03 DIAGNOSIS — H524 Presbyopia: Secondary | ICD-10-CM | POA: Diagnosis not present

## 2018-04-10 DIAGNOSIS — Z789 Other specified health status: Secondary | ICD-10-CM | POA: Diagnosis not present

## 2018-04-10 DIAGNOSIS — E441 Mild protein-calorie malnutrition: Secondary | ICD-10-CM | POA: Diagnosis not present

## 2018-04-20 IMAGING — US US ABDOMEN COMPLETE
1 series · 14 of 25 positions shown · non-contrast
Comparison: CT, 10/01/2015

CLINICAL DATA: Abnormal liver function tests.

EXAM:
ABDOMEN ULTRASOUND COMPLETE

[Series 1: us abdomen complete · 0.25mm/px · 14 of 70 slices shown]
[im 1/70]
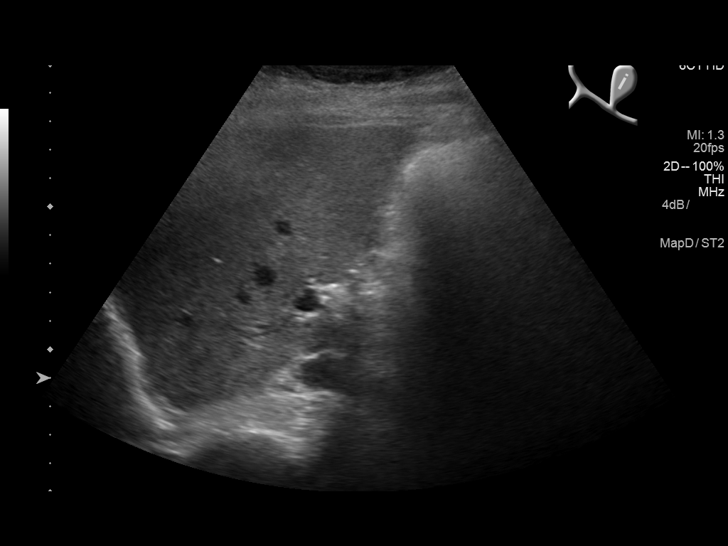
[im 6/70]
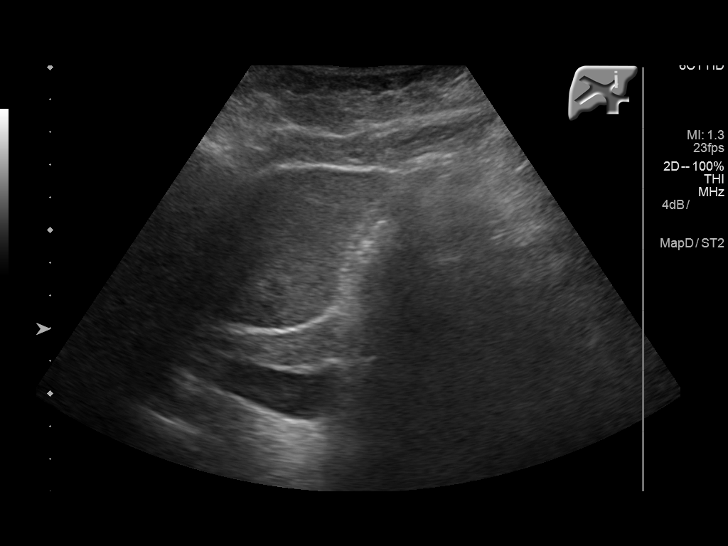
[im 12/70]
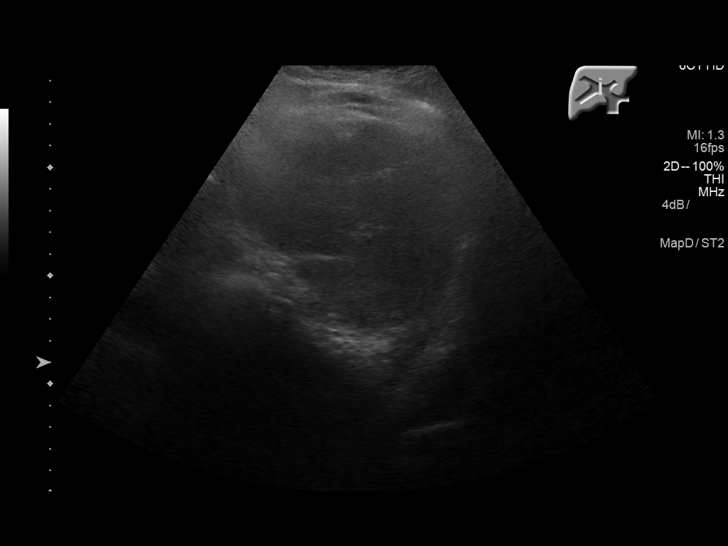
[im 18/70]
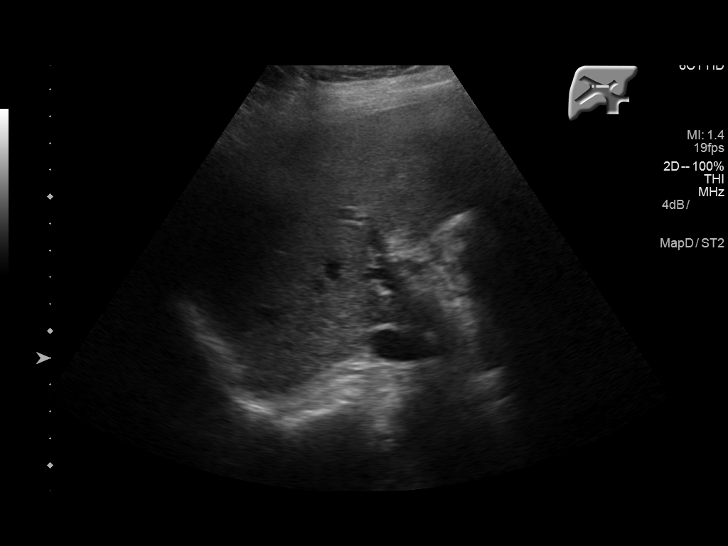
[im 24/70]
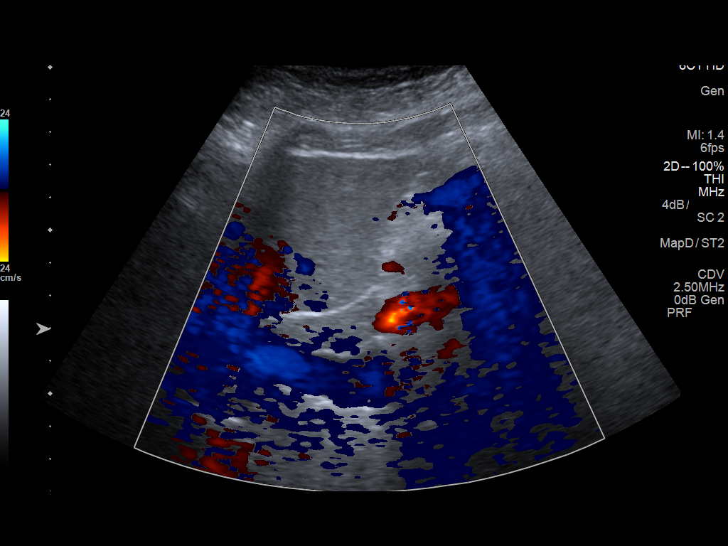
[im 26/70]
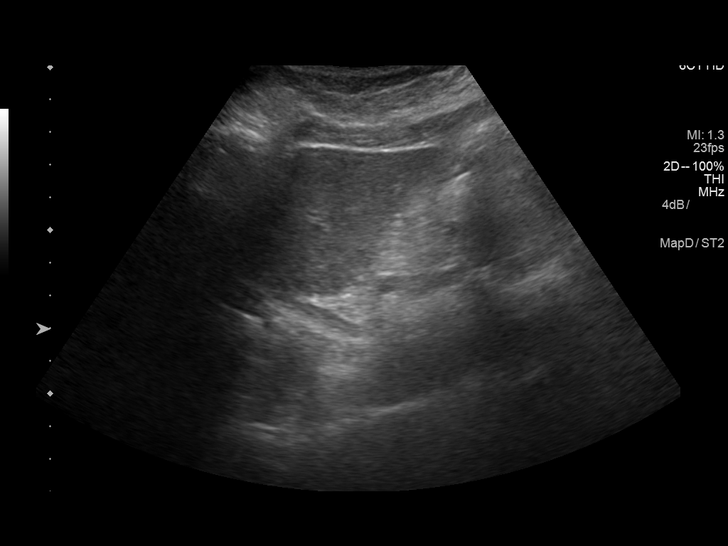
[im 32/70]
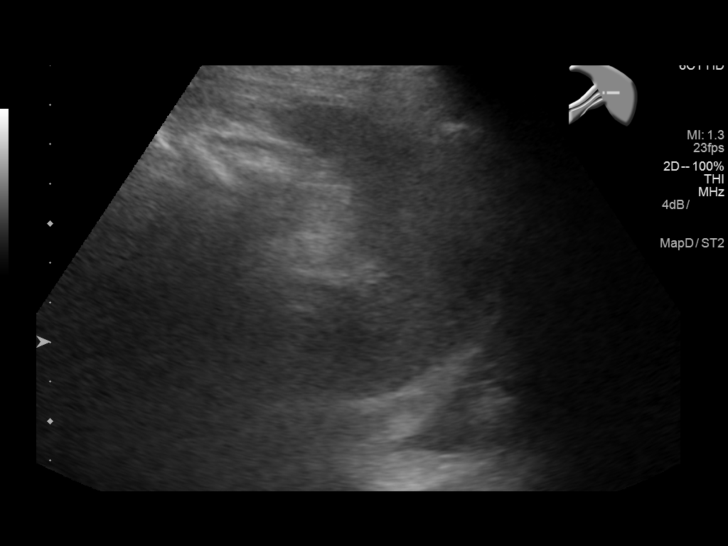
[im 38/70]
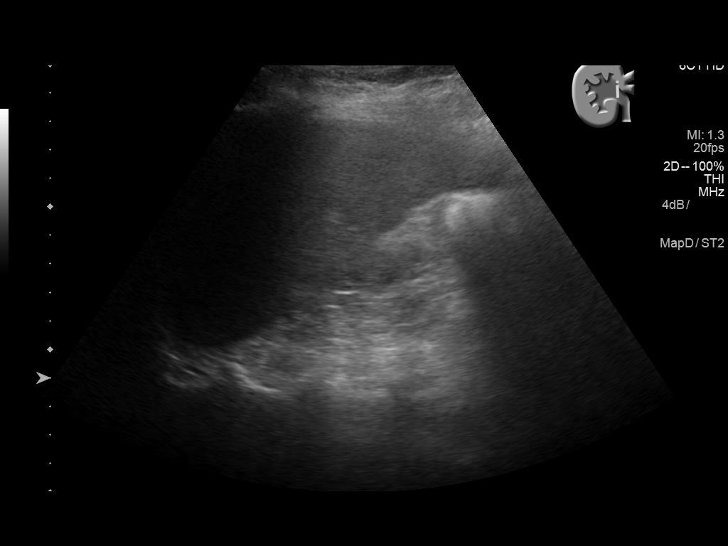
[im 44/70]
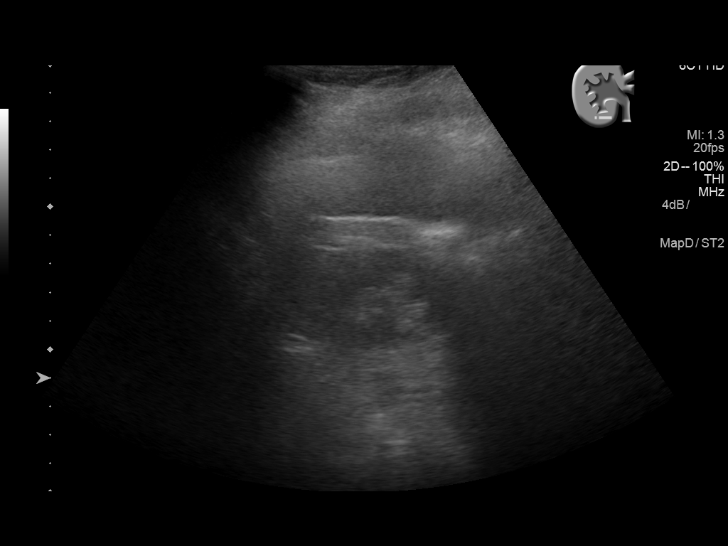
[im 47/70]
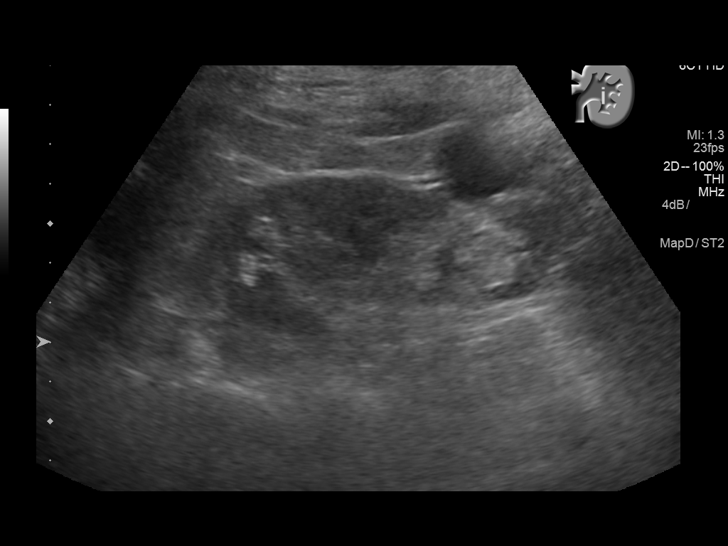
[im 52/70]
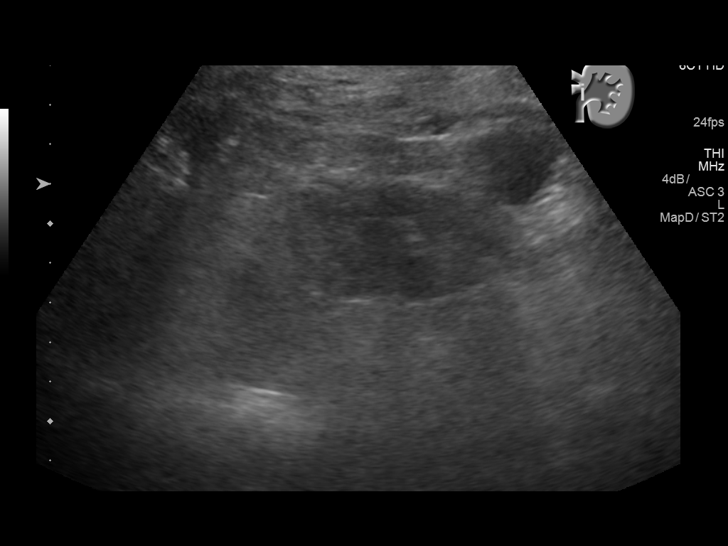
[im 58/70]
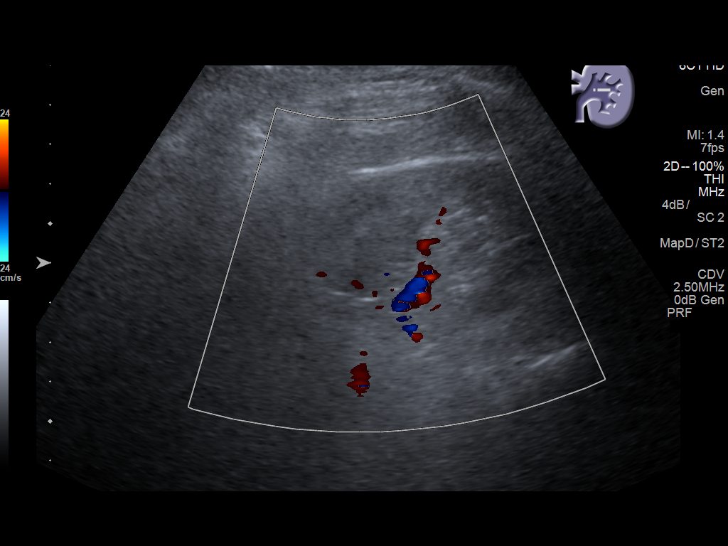
[im 64/70]
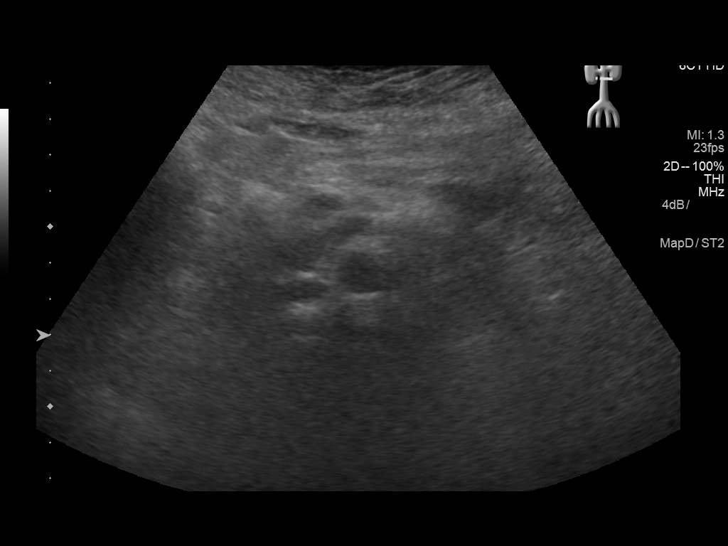
[im 70/70]
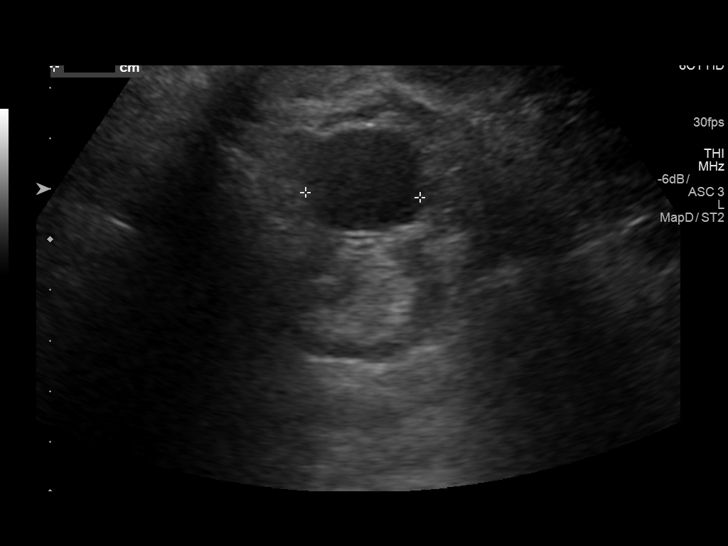

[14 of 25 positions shown; findings below may reference images not displayed]

FINDINGS: Gallbladder: Status post cholecystectomy

Common bile duct: Diameter: 6 mm

Liver: Mild increased parenchymal echogenicity. No liver mass or
focal lesion. Normal hepatopetal flow in the portal vein.

IVC: No abnormality visualized.

Pancreas: Visualized portion unremarkable.

Spleen: Not well visualized.  Portions seen are unremarkable.

Right Kidney: Length: 8.4 cm. Echogenicity within normal limits. No
mass or hydronephrosis visualized.

Left Kidney: Length: 3.7 cm. Normal parenchymal echogenicity. 2.4 cm
lower pole cyst. No other masses. No hydronephrosis.

Abdominal aorta: No aneurysm visualized.

Other findings: None.
IMPRESSION: 1. No acute findings.
2. Mild increased liver parenchymal echogenicity suggesting hepatic
steatosis. No liver mass or focal lesion.
3. 2.4 cm left renal cyst stable from the prior CT.

## 2018-05-02 DIAGNOSIS — E441 Mild protein-calorie malnutrition: Secondary | ICD-10-CM | POA: Diagnosis not present

## 2018-05-02 DIAGNOSIS — Z0000 Encounter for general adult medical examination without abnormal findings: Secondary | ICD-10-CM | POA: Diagnosis not present

## 2018-05-02 DIAGNOSIS — Z789 Other specified health status: Secondary | ICD-10-CM | POA: Diagnosis not present

## 2018-06-13 DIAGNOSIS — B351 Tinea unguium: Secondary | ICD-10-CM | POA: Diagnosis not present

## 2018-06-13 DIAGNOSIS — I739 Peripheral vascular disease, unspecified: Secondary | ICD-10-CM | POA: Diagnosis not present

## 2018-07-12 DIAGNOSIS — E039 Hypothyroidism, unspecified: Secondary | ICD-10-CM | POA: Diagnosis not present

## 2018-07-12 DIAGNOSIS — D649 Anemia, unspecified: Secondary | ICD-10-CM | POA: Diagnosis not present

## 2018-07-12 DIAGNOSIS — E119 Type 2 diabetes mellitus without complications: Secondary | ICD-10-CM | POA: Diagnosis not present

## 2018-07-12 DIAGNOSIS — I10 Essential (primary) hypertension: Secondary | ICD-10-CM | POA: Diagnosis not present

## 2018-07-12 DIAGNOSIS — E441 Mild protein-calorie malnutrition: Secondary | ICD-10-CM | POA: Diagnosis not present

## 2018-07-12 DIAGNOSIS — D638 Anemia in other chronic diseases classified elsewhere: Secondary | ICD-10-CM | POA: Diagnosis not present

## 2018-07-12 DIAGNOSIS — Z789 Other specified health status: Secondary | ICD-10-CM | POA: Diagnosis not present

## 2018-08-07 DIAGNOSIS — M5136 Other intervertebral disc degeneration, lumbar region: Secondary | ICD-10-CM | POA: Diagnosis not present

## 2018-08-08 DIAGNOSIS — M25551 Pain in right hip: Secondary | ICD-10-CM | POA: Diagnosis not present

## 2018-08-22 DIAGNOSIS — M6281 Muscle weakness (generalized): Secondary | ICD-10-CM | POA: Diagnosis not present

## 2018-08-23 DIAGNOSIS — M6281 Muscle weakness (generalized): Secondary | ICD-10-CM | POA: Diagnosis not present

## 2018-08-26 DIAGNOSIS — M6281 Muscle weakness (generalized): Secondary | ICD-10-CM | POA: Diagnosis not present

## 2018-08-27 DIAGNOSIS — M6281 Muscle weakness (generalized): Secondary | ICD-10-CM | POA: Diagnosis not present

## 2018-08-28 DIAGNOSIS — M8000XD Age-related osteoporosis with current pathological fracture, unspecified site, subsequent encounter for fracture with routine healing: Secondary | ICD-10-CM | POA: Diagnosis not present

## 2018-08-28 DIAGNOSIS — M6281 Muscle weakness (generalized): Secondary | ICD-10-CM | POA: Diagnosis not present

## 2018-08-28 DIAGNOSIS — Z789 Other specified health status: Secondary | ICD-10-CM | POA: Diagnosis not present

## 2018-08-28 DIAGNOSIS — E441 Mild protein-calorie malnutrition: Secondary | ICD-10-CM | POA: Diagnosis not present

## 2018-08-29 DIAGNOSIS — M6281 Muscle weakness (generalized): Secondary | ICD-10-CM | POA: Diagnosis not present

## 2018-08-30 DIAGNOSIS — M6281 Muscle weakness (generalized): Secondary | ICD-10-CM | POA: Diagnosis not present

## 2018-09-02 DIAGNOSIS — M6281 Muscle weakness (generalized): Secondary | ICD-10-CM | POA: Diagnosis not present

## 2018-09-03 DIAGNOSIS — M6281 Muscle weakness (generalized): Secondary | ICD-10-CM | POA: Diagnosis not present

## 2018-09-04 DIAGNOSIS — M6281 Muscle weakness (generalized): Secondary | ICD-10-CM | POA: Diagnosis not present

## 2018-09-05 ENCOUNTER — Telehealth: Payer: Self-pay

## 2018-09-05 NOTE — Telephone Encounter (Signed)
Called patient from recall list.  Number was disconnected.  This is the second attempt per recall list.

## 2018-09-12 NOTE — Telephone Encounter (Signed)
Called patient from recall list.  No answer. Number is disconnected.  This is the third attempt per recall list.  Will delete recall.

## 2018-10-18 DIAGNOSIS — M5416 Radiculopathy, lumbar region: Secondary | ICD-10-CM | POA: Diagnosis not present

## 2018-10-18 DIAGNOSIS — Z789 Other specified health status: Secondary | ICD-10-CM | POA: Diagnosis not present

## 2018-10-18 DIAGNOSIS — E441 Mild protein-calorie malnutrition: Secondary | ICD-10-CM | POA: Diagnosis not present

## 2018-11-13 DIAGNOSIS — Z03818 Encounter for observation for suspected exposure to other biological agents ruled out: Secondary | ICD-10-CM | POA: Diagnosis not present

## 2018-12-05 DIAGNOSIS — E441 Mild protein-calorie malnutrition: Secondary | ICD-10-CM | POA: Diagnosis not present

## 2018-12-05 DIAGNOSIS — Z789 Other specified health status: Secondary | ICD-10-CM | POA: Diagnosis not present

## 2018-12-16 DIAGNOSIS — D649 Anemia, unspecified: Secondary | ICD-10-CM | POA: Diagnosis not present

## 2018-12-16 DIAGNOSIS — E039 Hypothyroidism, unspecified: Secondary | ICD-10-CM | POA: Diagnosis not present

## 2018-12-16 DIAGNOSIS — E785 Hyperlipidemia, unspecified: Secondary | ICD-10-CM | POA: Diagnosis not present

## 2018-12-16 DIAGNOSIS — E119 Type 2 diabetes mellitus without complications: Secondary | ICD-10-CM | POA: Diagnosis not present

## 2018-12-16 DIAGNOSIS — E0821 Diabetes mellitus due to underlying condition with diabetic nephropathy: Secondary | ICD-10-CM | POA: Diagnosis not present

## 2018-12-16 DIAGNOSIS — I10 Essential (primary) hypertension: Secondary | ICD-10-CM | POA: Diagnosis not present

## 2019-01-20 DIAGNOSIS — H353132 Nonexudative age-related macular degeneration, bilateral, intermediate dry stage: Secondary | ICD-10-CM | POA: Diagnosis not present

## 2019-01-27 DIAGNOSIS — Z23 Encounter for immunization: Secondary | ICD-10-CM | POA: Diagnosis not present

## 2019-02-03 DIAGNOSIS — E039 Hypothyroidism, unspecified: Secondary | ICD-10-CM | POA: Diagnosis not present

## 2019-02-11 DIAGNOSIS — Z23 Encounter for immunization: Secondary | ICD-10-CM | POA: Diagnosis not present

## 2019-02-14 DIAGNOSIS — E441 Mild protein-calorie malnutrition: Secondary | ICD-10-CM | POA: Diagnosis not present

## 2019-02-14 DIAGNOSIS — Z789 Other specified health status: Secondary | ICD-10-CM | POA: Diagnosis not present

## 2019-04-28 DIAGNOSIS — Z853 Personal history of malignant neoplasm of breast: Secondary | ICD-10-CM | POA: Diagnosis not present

## 2019-04-28 DIAGNOSIS — E785 Hyperlipidemia, unspecified: Secondary | ICD-10-CM | POA: Diagnosis not present

## 2019-04-28 DIAGNOSIS — Z951 Presence of aortocoronary bypass graft: Secondary | ICD-10-CM | POA: Diagnosis not present

## 2019-04-28 DIAGNOSIS — M5136 Other intervertebral disc degeneration, lumbar region: Secondary | ICD-10-CM | POA: Diagnosis not present

## 2019-04-28 DIAGNOSIS — Z7982 Long term (current) use of aspirin: Secondary | ICD-10-CM | POA: Diagnosis not present

## 2019-04-28 DIAGNOSIS — U071 COVID-19: Secondary | ICD-10-CM | POA: Diagnosis not present

## 2019-04-28 DIAGNOSIS — M5416 Radiculopathy, lumbar region: Secondary | ICD-10-CM | POA: Diagnosis not present

## 2019-04-28 DIAGNOSIS — N183 Chronic kidney disease, stage 3 unspecified: Secondary | ICD-10-CM | POA: Diagnosis not present

## 2019-04-28 DIAGNOSIS — K219 Gastro-esophageal reflux disease without esophagitis: Secondary | ICD-10-CM | POA: Diagnosis not present

## 2019-04-28 DIAGNOSIS — M1990 Unspecified osteoarthritis, unspecified site: Secondary | ICD-10-CM | POA: Diagnosis not present

## 2019-04-28 DIAGNOSIS — R296 Repeated falls: Secondary | ICD-10-CM | POA: Diagnosis not present

## 2019-04-28 DIAGNOSIS — I129 Hypertensive chronic kidney disease with stage 1 through stage 4 chronic kidney disease, or unspecified chronic kidney disease: Secondary | ICD-10-CM | POA: Diagnosis not present

## 2019-04-28 DIAGNOSIS — E039 Hypothyroidism, unspecified: Secondary | ICD-10-CM | POA: Diagnosis not present

## 2019-04-28 DIAGNOSIS — K279 Peptic ulcer, site unspecified, unspecified as acute or chronic, without hemorrhage or perforation: Secondary | ICD-10-CM | POA: Diagnosis not present

## 2019-04-28 DIAGNOSIS — M24562 Contracture, left knee: Secondary | ICD-10-CM | POA: Diagnosis not present

## 2019-04-28 DIAGNOSIS — I2510 Atherosclerotic heart disease of native coronary artery without angina pectoris: Secondary | ICD-10-CM | POA: Diagnosis not present

## 2019-04-28 DIAGNOSIS — I951 Orthostatic hypotension: Secondary | ICD-10-CM | POA: Diagnosis not present

## 2019-04-28 DIAGNOSIS — E441 Mild protein-calorie malnutrition: Secondary | ICD-10-CM | POA: Diagnosis not present

## 2019-04-28 DIAGNOSIS — G4700 Insomnia, unspecified: Secondary | ICD-10-CM | POA: Diagnosis not present

## 2019-04-28 DIAGNOSIS — M24561 Contracture, right knee: Secondary | ICD-10-CM | POA: Diagnosis not present

## 2019-04-28 DIAGNOSIS — R627 Adult failure to thrive: Secondary | ICD-10-CM | POA: Diagnosis not present

## 2019-05-02 DIAGNOSIS — R627 Adult failure to thrive: Secondary | ICD-10-CM | POA: Diagnosis not present

## 2019-05-02 DIAGNOSIS — I951 Orthostatic hypotension: Secondary | ICD-10-CM | POA: Diagnosis not present

## 2019-05-02 DIAGNOSIS — Z0000 Encounter for general adult medical examination without abnormal findings: Secondary | ICD-10-CM | POA: Diagnosis not present

## 2019-05-02 DIAGNOSIS — Z951 Presence of aortocoronary bypass graft: Secondary | ICD-10-CM | POA: Diagnosis not present

## 2019-05-02 DIAGNOSIS — E785 Hyperlipidemia, unspecified: Secondary | ICD-10-CM | POA: Diagnosis not present

## 2019-05-02 DIAGNOSIS — M1990 Unspecified osteoarthritis, unspecified site: Secondary | ICD-10-CM | POA: Diagnosis not present

## 2019-05-02 DIAGNOSIS — Z7982 Long term (current) use of aspirin: Secondary | ICD-10-CM | POA: Diagnosis not present

## 2019-05-02 DIAGNOSIS — R296 Repeated falls: Secondary | ICD-10-CM | POA: Diagnosis not present

## 2019-05-02 DIAGNOSIS — M5136 Other intervertebral disc degeneration, lumbar region: Secondary | ICD-10-CM | POA: Diagnosis not present

## 2019-05-02 DIAGNOSIS — I129 Hypertensive chronic kidney disease with stage 1 through stage 4 chronic kidney disease, or unspecified chronic kidney disease: Secondary | ICD-10-CM | POA: Diagnosis not present

## 2019-05-02 DIAGNOSIS — Z853 Personal history of malignant neoplasm of breast: Secondary | ICD-10-CM | POA: Diagnosis not present

## 2019-05-02 DIAGNOSIS — G4700 Insomnia, unspecified: Secondary | ICD-10-CM | POA: Diagnosis not present

## 2019-05-02 DIAGNOSIS — Z789 Other specified health status: Secondary | ICD-10-CM | POA: Diagnosis not present

## 2019-05-02 DIAGNOSIS — M5416 Radiculopathy, lumbar region: Secondary | ICD-10-CM | POA: Diagnosis not present

## 2019-05-02 DIAGNOSIS — N183 Chronic kidney disease, stage 3 unspecified: Secondary | ICD-10-CM | POA: Diagnosis not present

## 2019-05-02 DIAGNOSIS — M24561 Contracture, right knee: Secondary | ICD-10-CM | POA: Diagnosis not present

## 2019-05-02 DIAGNOSIS — I2510 Atherosclerotic heart disease of native coronary artery without angina pectoris: Secondary | ICD-10-CM | POA: Diagnosis not present

## 2019-05-02 DIAGNOSIS — M24562 Contracture, left knee: Secondary | ICD-10-CM | POA: Diagnosis not present

## 2019-05-02 DIAGNOSIS — E039 Hypothyroidism, unspecified: Secondary | ICD-10-CM | POA: Diagnosis not present

## 2019-05-02 DIAGNOSIS — K279 Peptic ulcer, site unspecified, unspecified as acute or chronic, without hemorrhage or perforation: Secondary | ICD-10-CM | POA: Diagnosis not present

## 2019-05-02 DIAGNOSIS — E441 Mild protein-calorie malnutrition: Secondary | ICD-10-CM | POA: Diagnosis not present

## 2019-05-02 DIAGNOSIS — K219 Gastro-esophageal reflux disease without esophagitis: Secondary | ICD-10-CM | POA: Diagnosis not present

## 2019-05-05 DIAGNOSIS — Z0000 Encounter for general adult medical examination without abnormal findings: Secondary | ICD-10-CM | POA: Diagnosis not present

## 2019-05-05 DIAGNOSIS — E441 Mild protein-calorie malnutrition: Secondary | ICD-10-CM | POA: Diagnosis not present

## 2019-05-05 DIAGNOSIS — Z789 Other specified health status: Secondary | ICD-10-CM | POA: Diagnosis not present

## 2019-06-02 DEATH — deceased

## 2019-07-04 ENCOUNTER — Telehealth: Payer: Self-pay | Admitting: Internal Medicine

## 2019-07-04 NOTE — Telephone Encounter (Signed)
-----   Message from Salvatore Marvel sent at 07/04/2019 10:24 AM EST ----- Regarding: remove pcp Pt under hospice care and is following Dr. Ouida Sills at the nursing home. Please remove pcp  Thanks, Ebony Hail
